# Patient Record
Sex: Female | Born: 1935 | Race: Black or African American | Hispanic: No | Smoking: Never smoker
Health system: Southern US, Community
[De-identification: ages and names within clinical notes are randomized; demographics above are authoritative.]

## PROBLEM LIST (undated history)

## (undated) DIAGNOSIS — I471 Supraventricular tachycardia, unspecified: Secondary | ICD-10-CM

## (undated) DIAGNOSIS — D631 Anemia in chronic kidney disease: Secondary | ICD-10-CM

## (undated) DIAGNOSIS — F32A Depression, unspecified: Secondary | ICD-10-CM

## (undated) DIAGNOSIS — I10 Essential (primary) hypertension: Secondary | ICD-10-CM

## (undated) DIAGNOSIS — F0390 Unspecified dementia without behavioral disturbance: Secondary | ICD-10-CM

## (undated) DIAGNOSIS — D519 Vitamin B12 deficiency anemia, unspecified: Secondary | ICD-10-CM

## (undated) DIAGNOSIS — M009 Pyogenic arthritis, unspecified: Secondary | ICD-10-CM

## (undated) DIAGNOSIS — F329 Major depressive disorder, single episode, unspecified: Secondary | ICD-10-CM

## (undated) DIAGNOSIS — N183 Chronic kidney disease, stage 3 (moderate): Secondary | ICD-10-CM

## (undated) DIAGNOSIS — E119 Type 2 diabetes mellitus without complications: Secondary | ICD-10-CM

## (undated) DIAGNOSIS — E785 Hyperlipidemia, unspecified: Secondary | ICD-10-CM

## (undated) DIAGNOSIS — K922 Gastrointestinal hemorrhage, unspecified: Secondary | ICD-10-CM

## (undated) DIAGNOSIS — N189 Chronic kidney disease, unspecified: Secondary | ICD-10-CM

## (undated) HISTORY — DX: Gastrointestinal hemorrhage, unspecified: K92.2

## (undated) HISTORY — DX: Hyperlipidemia, unspecified: E78.5

## (undated) HISTORY — DX: Chronic kidney disease, stage 3 (moderate): N18.3

## (undated) HISTORY — DX: Supraventricular tachycardia: I47.1

## (undated) HISTORY — DX: Anemia in chronic kidney disease: D63.1

## (undated) HISTORY — DX: Supraventricular tachycardia, unspecified: I47.10

## (undated) HISTORY — DX: Essential (primary) hypertension: I10

## (undated) HISTORY — DX: Type 2 diabetes mellitus without complications: E11.9

## (undated) HISTORY — DX: Unspecified dementia, unspecified severity, without behavioral disturbance, psychotic disturbance, mood disturbance, and anxiety: F03.90

## (undated) HISTORY — DX: Pyogenic arthritis, unspecified: M00.9

## (undated) HISTORY — PX: KNEE SURGERY: SHX244

## (undated) HISTORY — DX: Chronic kidney disease, unspecified: N18.9

## (undated) HISTORY — DX: Vitamin B12 deficiency anemia, unspecified: D51.9

---

## 2001-04-06 ENCOUNTER — Encounter: Payer: Self-pay | Admitting: Internal Medicine

## 2001-04-06 ENCOUNTER — Ambulatory Visit (HOSPITAL_COMMUNITY): Admission: RE | Admit: 2001-04-06 | Discharge: 2001-04-06 | Payer: Self-pay | Admitting: Internal Medicine

## 2002-04-19 ENCOUNTER — Encounter: Payer: Self-pay | Admitting: Internal Medicine

## 2002-04-19 ENCOUNTER — Ambulatory Visit (HOSPITAL_COMMUNITY): Admission: RE | Admit: 2002-04-19 | Discharge: 2002-04-19 | Payer: Self-pay | Admitting: Internal Medicine

## 2003-04-28 ENCOUNTER — Ambulatory Visit (HOSPITAL_COMMUNITY): Admission: RE | Admit: 2003-04-28 | Discharge: 2003-04-28 | Payer: Self-pay | Admitting: Internal Medicine

## 2003-04-28 ENCOUNTER — Encounter: Payer: Self-pay | Admitting: Internal Medicine

## 2004-04-30 ENCOUNTER — Ambulatory Visit (HOSPITAL_COMMUNITY): Admission: RE | Admit: 2004-04-30 | Discharge: 2004-04-30 | Payer: Self-pay | Admitting: Internal Medicine

## 2005-05-03 ENCOUNTER — Ambulatory Visit (HOSPITAL_COMMUNITY): Admission: RE | Admit: 2005-05-03 | Discharge: 2005-05-03 | Payer: Self-pay | Admitting: Internal Medicine

## 2006-05-12 ENCOUNTER — Ambulatory Visit (HOSPITAL_COMMUNITY): Admission: RE | Admit: 2006-05-12 | Discharge: 2006-05-12 | Payer: Self-pay | Admitting: Internal Medicine

## 2006-06-04 ENCOUNTER — Ambulatory Visit (HOSPITAL_COMMUNITY): Admission: RE | Admit: 2006-06-04 | Discharge: 2006-06-04 | Payer: Self-pay | Admitting: Internal Medicine

## 2006-07-28 ENCOUNTER — Ambulatory Visit (HOSPITAL_COMMUNITY): Admission: RE | Admit: 2006-07-28 | Discharge: 2006-07-28 | Payer: Self-pay | Admitting: Internal Medicine

## 2006-08-04 ENCOUNTER — Ambulatory Visit: Payer: Self-pay | Admitting: Orthopedic Surgery

## 2006-08-18 ENCOUNTER — Ambulatory Visit: Payer: Self-pay | Admitting: Orthopedic Surgery

## 2006-11-03 ENCOUNTER — Emergency Department (HOSPITAL_COMMUNITY): Admission: EM | Admit: 2006-11-03 | Discharge: 2006-11-04 | Payer: Self-pay | Admitting: Emergency Medicine

## 2007-06-22 ENCOUNTER — Ambulatory Visit (HOSPITAL_COMMUNITY): Admission: RE | Admit: 2007-06-22 | Discharge: 2007-06-22 | Payer: Self-pay | Admitting: Internal Medicine

## 2008-06-23 ENCOUNTER — Ambulatory Visit (HOSPITAL_COMMUNITY): Admission: RE | Admit: 2008-06-23 | Discharge: 2008-06-23 | Payer: Self-pay | Admitting: Internal Medicine

## 2009-06-26 ENCOUNTER — Ambulatory Visit (HOSPITAL_COMMUNITY): Admission: RE | Admit: 2009-06-26 | Discharge: 2009-06-26 | Payer: Self-pay | Admitting: Internal Medicine

## 2010-06-28 ENCOUNTER — Ambulatory Visit (HOSPITAL_COMMUNITY): Admission: RE | Admit: 2010-06-28 | Discharge: 2010-06-28 | Payer: Self-pay | Admitting: Internal Medicine

## 2011-06-10 ENCOUNTER — Other Ambulatory Visit (HOSPITAL_COMMUNITY): Payer: Self-pay | Admitting: Internal Medicine

## 2011-06-10 DIAGNOSIS — Z139 Encounter for screening, unspecified: Secondary | ICD-10-CM

## 2011-07-02 ENCOUNTER — Ambulatory Visit (HOSPITAL_COMMUNITY)
Admission: RE | Admit: 2011-07-02 | Discharge: 2011-07-02 | Disposition: A | Discharge status: Home / Self Care | Payer: Medicare Other | Source: Ambulatory Visit | Attending: Internal Medicine | Admitting: Internal Medicine

## 2011-07-02 ENCOUNTER — Ambulatory Visit (HOSPITAL_COMMUNITY): Payer: Medicare Other

## 2011-07-02 DIAGNOSIS — Z1231 Encounter for screening mammogram for malignant neoplasm of breast: Secondary | ICD-10-CM | POA: Insufficient documentation

## 2011-07-02 DIAGNOSIS — Z139 Encounter for screening, unspecified: Secondary | ICD-10-CM

## 2011-10-31 ENCOUNTER — Emergency Department (HOSPITAL_COMMUNITY)
Admission: EM | Admit: 2011-10-31 | Discharge: 2011-10-31 | Disposition: A | Discharge status: Home / Self Care | Payer: Medicare Other | Attending: Emergency Medicine | Admitting: Emergency Medicine

## 2011-10-31 ENCOUNTER — Encounter (HOSPITAL_COMMUNITY): Payer: Self-pay

## 2011-10-31 DIAGNOSIS — W19XXXA Unspecified fall, initial encounter: Secondary | ICD-10-CM

## 2011-10-31 DIAGNOSIS — W010XXA Fall on same level from slipping, tripping and stumbling without subsequent striking against object, initial encounter: Secondary | ICD-10-CM | POA: Insufficient documentation

## 2011-10-31 DIAGNOSIS — S40029A Contusion of unspecified upper arm, initial encounter: Secondary | ICD-10-CM | POA: Insufficient documentation

## 2011-10-31 NOTE — ED Notes (Signed)
Pt reports falling today at the store.  Pt denies hitting her head.  Pt reports catching herself with her hands.  Pt reports pain to her rt arm and rt leg.

## 2011-10-31 NOTE — ED Notes (Signed)
Pt states she tripped over a box

## 2011-10-31 NOTE — ED Provider Notes (Signed)
History   This chart was scribed for Laura Diego, MD scribed by Mitchell Heir. The patient was seen in room [facility]18/[facility]18 seen at 14:00.    CSN: [id number]  Arrival date & time 10/31/11  1206   First MD Initiated Contact with Patient 10/31/11 1358      Chief Complaint  Patient presents with  . Fall    (Consider location/radiation/quality/duration/timing/severity/associated sxs/prior Treatment) Laura Davenport is a 78 y.o. female  Patient is a 78 y.o. female presenting with fall. The history is provided by the patient. No language interpreter was used.  Fall The accident occurred 6 to 12 hours ago. The fall occurred while walking. She fell from an unknown height. She landed on a hard floor. There was no blood loss. She was ambulatory at the scene. There was no entrapment after the fall. There was no drug use involved in the accident. There was no alcohol use involved in the accident. She has tried nothing for the symptoms.    Past Medical History  Diagnosis Date  . Diabetes mellitus   . Hypertension     History reviewed. No pertinent past surgical history.  History reviewed. No pertinent family history.  History  Substance Use Topics  . Smoking status: Not on file  . Smokeless tobacco: Not on file  . Alcohol Use: No    Review of Systems  All other systems reviewed and are negative.    Allergies  Review of patient's allergies indicates no known allergies.  Home Medications   Current Outpatient Rx  Name Route Sig Dispense Refill  . ASPIRIN EC 81 MG PO TBEC Oral Take 81 mg by mouth daily.      BP 166/107  Pulse 98  Temp 98.1 F (36.7 C)  Resp 20  Ht 5\' 7"  (1.702 m)  Wt 189 lb (85.73 kg)  BMI 29.60 kg/m2  SpO2 99%  Physical Exam  Nursing note and vitals reviewed. Constitutional: She is oriented to person, place, and time. She appears well-developed and well-nourished. No distress.  HENT:  Head: Normocephalic and atraumatic.  Eyes: Conjunctivae and EOM  are normal. No scleral icterus.  Neck: Neck supple. No thyromegaly present.  Cardiovascular: Normal rate and regular rhythm.  Exam reveals no gallop and no friction rub.   No murmur heard. Pulmonary/Chest: No stridor. She has no wheezes. She has no rales. She exhibits no tenderness.  Abdominal: She exhibits no distension. There is no tenderness. There is no rebound.  Musculoskeletal: Normal range of motion. She exhibits tenderness. She exhibits no edema.       Mild tenderness to right humerus   Lymphadenopathy:    She has no cervical adenopathy.  Neurological: She is oriented to person, place, and time. Coordination normal.  Skin: No rash noted. No erythema.  Psychiatric: She has a normal mood and affect. Her behavior is normal.    ED Course  Procedures (including critical care time) DIAGNOSTIC STUDIES: Oxygen Saturation is 99% on room air, normal by my interpretation.    COORDINATION OF CARE:  Labs Reviewed - No data to display No results found.   No diagnosis found.    MDM   The chart was scribed for me under my direct supervision.  I personally performed the history, physical, and medical decision making and all procedures in the evaluation of this patient.Laura Diego, MD 10/31/11 1435

## 2011-10-31 NOTE — ED Notes (Signed)
Pt left before receiving her d/c papers

## 2011-10-31 NOTE — Discharge Instructions (Signed)
Follow up with your md if any problems °

## 2011-11-14 ENCOUNTER — Telehealth (HOSPITAL_COMMUNITY): Payer: Self-pay | Admitting: Dietician

## 2011-11-14 NOTE — Telephone Encounter (Addendum)
Received referral from Dr. Willey Blade on 11/11/11 for dx: diabetes.

## 2011-11-14 NOTE — Telephone Encounter (Signed)
Appointment scheduled for 11/20/11 @ 8:30 AM.

## 2011-11-20 ENCOUNTER — Encounter (HOSPITAL_COMMUNITY): Payer: Self-pay | Admitting: Dietician

## 2011-11-20 NOTE — Progress Notes (Signed)
Outpatient Initial Nutrition Assessment  Date:11/20/2011   Time: 8:30 AM  Referring Physician: Reason for Visit:   Nutrition Assessment:  Ht: Height: 5\' 5"  (165.1 cm)   Wt:Weight: 189 lb (85.73 kg)   IBW: 125# %IBW: 151% UBW: 188# %UBW: 100% BMI: Body mass index is 31.45 kg/(m^2).  Goal Weight: 170# (10% weight loss) Weight hx: Pt reports her highest was was 260# about 15 years ago, around the time she retired and was diagnosed with diabetes. She is unable to recall her lowest weight, but reports she was "very little".   Estimated nutritional needs: 1558-1699 kcals, 69-86 protein daily, 1.6-1.7 L fluid daily  PMH:  Past Medical History  Diagnosis Date  . Diabetes mellitus   . Hypertension     Medications:  Current Outpatient Rx  Name Route Sig Dispense Refill  . AMLODIPINE-OLMESARTAN 5-40 MG PO TABS Oral Take 1 tablet by mouth daily.    Marland Kitchen AMLODIPINE BESYLATE-VALSARTAN 5-320 MG PO TABS Oral Take 1 tablet by mouth daily.    . ASPIRIN EC 81 MG PO TBEC Oral Take 81 mg by mouth daily.    . ATORVASTATIN CALCIUM 40 MG PO TABS Oral Take 40 mg by mouth daily.    Marland Kitchen HYDROCHLOROTHIAZIDE 25 MG PO TABS Oral Take 25 mg by mouth daily.    . INSULIN GLARGINE 100 UNIT/ML [place] SOLN Subcutaneous Inject 45 Units into the skin at bedtime.      Labs: CMP  No results found for this basename: na, k, cl, co2, glucose, bun, creatinine, calcium, prot, albumin, ast, alt, alkphos, bilitot, gfrnonaa, gfraa    Lipid Panel  No results found for this basename: chol, trig, hdl, cholhdl, vldl, ldlcalc     No results found for this basename: HGBA1C   No results found for this basename: GLUF, MICROALBUR, LDLCALC, CREATININE    Per Dr. Ria Comment records: Hgb A1c: 9.8, Glucose: 281, Total Cholesterol: 198  Lifestyle/ social habits: Laura Davenport lives alone in [place]. She is a widow; her husband passed away 11 years ago. She has 10 children who lives in various parts of the country, but 6 live locally in  [place] and [place]. Her youngest son frequently checks in on her. She retired 17 years ago from [employer]. She reports her stress level at 2 or 3, citing very little stress as her faith is very important to her. She is very active in her church community, spending most afternoons at [employer]. 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   Nutrition hx/habits: Laura Davenport mainly cooks at home. She will eat at her daughter's house on Sunday. She goes to [employer] once every 2-3 months with family. She did not bring her glucometer with her today and cannot remember her home CBGs. She reports her meter has not been working properly, but she is going to take it to Dr. Ria Comment office. She eats 2 meals a day and drinks mostly water. She walks around her house for 20 minutes, 3 times a week. She reports she does not like to go outside due to the weather and because is afraid of the dogs in her neighborhood.  The last times she received diabetes education was 15 years ago, when she went to a class at the health department when she was first diagnosed.   Diet recall: Breakfast (9:30-10 AM): toast with margarine (rotates between white and whole wheat bread), boiled egg, coffee with sugar; Lunch (3:00 PM): pinto beans, orange soda;  HS snack: pack of crackers  Nutrition Diagnosis: Inconsistent carbohydrate intake r/t disordered eating pattern AEB skipping meals, Hgb A1c: 9.8.   Nutrition Intervention: Nutrition rx: 1200 kcal NAS, diabetic diet; 3 meals per day; limit 1 starch per meal; low calorie beverages only; 20-30 minutes physical activity 5 times per week  Education/Counseling Provided: Educated pt on diabetic diet principles. Emphasized plate method, sources of carbohydrates, and importance of eating 3 meals per day. Discussed alternatives to sodas. Discussed importance of Na restriction and discussed ways to season foods without salt. Provided Mrs. Dash and  Splenda samples. Provided plate method handout.   Understanding, Motivation, Ability to Follow Recommendations: Expect fair to good compliance.   Monitoring and Evaluation: Goals: 1) 1-2# weight loss per week; 2) 20-30 minutes physical activity 5 times per week; 3) 3 meals per day; 4) Hgb A1c < 7.0  Recommendations: 1) For weight loss: 1058-1199 kcals daily; 2) 3 meals per day; 3) Use salt-free seasoning for salt; artificial sweetener in place of sugar  F/U: PRN. Provided RD contact information.  Alene Mires, RD  11/20/2011  Time: 8:30 AM

## 2012-03-17 ENCOUNTER — Emergency Department (HOSPITAL_COMMUNITY)
Admission: EM | Admit: 2012-03-17 | Discharge: 2012-03-18 | Disposition: A | Discharge status: Home / Self Care | Payer: Medicare Other | Attending: Emergency Medicine | Admitting: Emergency Medicine

## 2012-03-17 ENCOUNTER — Encounter (HOSPITAL_COMMUNITY): Payer: Self-pay | Admitting: Emergency Medicine

## 2012-03-17 DIAGNOSIS — Z794 Long term (current) use of insulin: Secondary | ICD-10-CM | POA: Insufficient documentation

## 2012-03-17 DIAGNOSIS — E1169 Type 2 diabetes mellitus with other specified complication: Secondary | ICD-10-CM | POA: Insufficient documentation

## 2012-03-17 DIAGNOSIS — E162 Hypoglycemia, unspecified: Secondary | ICD-10-CM

## 2012-03-17 DIAGNOSIS — I10 Essential (primary) hypertension: Secondary | ICD-10-CM | POA: Insufficient documentation

## 2012-03-17 DIAGNOSIS — Z79899 Other long term (current) drug therapy: Secondary | ICD-10-CM | POA: Insufficient documentation

## 2012-03-17 DIAGNOSIS — N390 Urinary tract infection, site not specified: Secondary | ICD-10-CM | POA: Insufficient documentation

## 2012-03-17 LAB — GLUCOSE, CAPILLARY
Glucose-Capillary: 188 mg/dL — ABNORMAL HIGH (ref 70–99)
Glucose-Capillary: 80 mg/dL (ref 70–99)

## 2012-03-17 LAB — BASIC METABOLIC PANEL
BUN: 19 mg/dL (ref 6–23)
CO2: 26 mEq/L (ref 19–32)
Calcium: 10.5 mg/dL (ref 8.4–10.5)
Chloride: 105 mEq/L (ref 96–112)
Creatinine, Ser: 0.86 mg/dL (ref 0.50–1.10)
GFR calc Af Amer: 73 mL/min — ABNORMAL LOW (ref >90)
GFR calc non Af Amer: 63 mL/min — ABNORMAL LOW (ref >90)
Glucose, Bld: 187 mg/dL — ABNORMAL HIGH (ref 70–99)
Potassium: 3.5 mEq/L (ref 3.5–5.1)
Sodium: 142 mEq/L (ref 135–145)

## 2012-03-17 LAB — URINALYSIS, ROUTINE W REFLEX MICROSCOPIC
Bilirubin Urine: NEGATIVE
Glucose, UA: NEGATIVE mg/dL
Ketones, ur: NEGATIVE mg/dL
Nitrite: POSITIVE — AB
Protein, ur: NEGATIVE mg/dL
Specific Gravity, Urine: >1.03 — ABNORMAL HIGH (ref 1.005–1.030)
Urobilinogen, UA: 0.2 mg/dL (ref 0.0–1.0)
pH: 5.5 (ref 5.0–8.0)

## 2012-03-17 LAB — CBC WITH DIFFERENTIAL/PLATELET
Basophils Absolute: 0 10*3/uL (ref 0.0–0.1)
Basophils Relative: 0 % (ref 0–1)
Eosinophils Absolute: 0 10*3/uL (ref 0.0–0.7)
Eosinophils Relative: 0 % (ref 0–5)
HCT: 35.8 % — ABNORMAL LOW (ref 36.0–46.0)
Hemoglobin: 12.4 g/dL (ref 12.0–15.0)
Lymphocytes Relative: 12 % (ref 12–46)
Lymphs Abs: 1.1 10*3/uL (ref 0.7–4.0)
MCH: 31.9 pg (ref 26.0–34.0)
MCHC: 34.6 g/dL (ref 30.0–36.0)
MCV: 92 fL (ref 78.0–100.0)
Monocytes Absolute: 0.4 10*3/uL (ref 0.1–1.0)
Monocytes Relative: 4 % (ref 3–12)
Neutro Abs: 8.2 10*3/uL — ABNORMAL HIGH (ref 1.7–7.7)
Neutrophils Relative %: 84 % — ABNORMAL HIGH (ref 43–77)
Platelets: 218 10*3/uL (ref 150–400)
RBC: 3.89 MIL/uL (ref 3.87–5.11)
RDW: 13.9 % (ref 11.5–15.5)
WBC: 9.7 10*3/uL (ref 4.0–10.5)

## 2012-03-17 LAB — URINE MICROSCOPIC-ADD ON

## 2012-03-17 MED ORDER — CEPHALEXIN 500 MG PO CAPS
500.0000 mg | ORAL_CAPSULE | Freq: Once | ORAL | Status: AC
  Administered 2012-03-18: 500 mg via ORAL
  Filled 2012-03-17: qty 1

## 2012-03-17 MED ORDER — CEPHALEXIN 500 MG PO CAPS: 500.0000 mg | ORAL_CAPSULE | Freq: Three times a day (TID) | ORAL | Status: AC

## 2012-03-17 NOTE — ED Notes (Signed)
EMS states she was found unresponsive by family and CBL was 90, administered glucagon and repeat CBL was 81.  EMS states pt was somewhat combative on scene and was unable to establish IV access.  On arrival here, pt is alert and pleasant, family at bedside.

## 2012-03-17 NOTE — ED Notes (Signed)
Pt ambulatory to restroom, steady gait, stand by assist.

## 2012-03-17 NOTE — ED Notes (Signed)
Pt states she is feeling well, ready to go home.

## 2012-03-17 NOTE — ED Provider Notes (Signed)
History  This chart was scribed for Janice Norrie, MD by Jenne Campus. This patient was seen in room [facility]15/[facility]15 and the patient's care was started at 10:28PM.  CSN: [id number]  Arrival date & time 03/17/12  2203   First MD Initiated Contact with Patient 03/17/12 2228      Chief Complaint  Patient presents with  . Hypoglycemia     The history is provided by the patient. No language interpreter was used.    Laura Davenport is a 78 y.o. female brought in by ambulance, who presents to the Emergency Department for hypoglycemia. EMS reports that pt was found unresponsive by family and CBL was 5.  Upon administration of glucagon, CBL rose to 81.  EMS reports pt was somewhat combative on scene and they were unable to establish an IV.  Upon arrival to ED, pt is alert and pleasant.  Pt reports eating breakfast (eggs and toast) 13 hours PTA, lunch ([place] and broccoli) 8 hours PTA, but no food intake since.  Pt reports that this is a normal daily food intake and schedule. She usually eats later in the evening, but sometimes eats a snack before her evening meal.  Pt denies having any symptoms currently including diarrhea, nausea, difficulty urinating, fever, lethargy and coughing.  Pt has a h/o HTN and DM and takes metformin and insulin.  Pt denies tobacco and alcohol use.    PCP Dr Willey Blade  Past Medical History  Diagnosis Date  . Diabetes mellitus   . Hypertension     History reviewed. No pertinent past surgical history.  No family history on file.  History  Substance Use Topics  . Smoking status: Not on file  . Smokeless tobacco: Not on file  . Alcohol Use: No  lives alone  No Ob history provided.  Review of Systems  Constitutional: Negative for fever and chills.  Respiratory: Negative for cough and shortness of breath.   Cardiovascular: Negative for chest pain.  Gastrointestinal: Negative for nausea, vomiting and abdominal pain.  Genitourinary: Negative for difficulty urinating.   All other systems reviewed and are negative.    Allergies  Review of patient's allergies indicates no known allergies.  Home Medications   Current Outpatient Rx  Name Route Sig Dispense Refill  . AMLODIPINE-OLMESARTAN 5-40 MG PO TABS Oral Take 1 tablet by mouth daily.    Marland Kitchen AMLODIPINE BESYLATE-VALSARTAN 5-320 MG PO TABS Oral Take 1 tablet by mouth daily.    . ASPIRIN EC 81 MG PO TBEC Oral Take 81 mg by mouth daily.    . ATORVASTATIN CALCIUM 40 MG PO TABS Oral Take 40 mg by mouth daily.    Marland Kitchen HYDROCHLOROTHIAZIDE 25 MG PO TABS Oral Take 25 mg by mouth daily.    . INSULIN GLARGINE 100 UNIT/ML [place] SOLN Subcutaneous Inject 45 Units into the skin at bedtime.    metformin    BP 127/73  Pulse 97  Temp 94.6 F (34.8 C) (Oral)  Resp 18  Ht 5\' 7"  (1.702 m)  Wt 190 lb (86.183 kg)  BMI 29.76 kg/m2  SpO2 97%  Vital signs normal    Physical Exam  Nursing note and vitals reviewed. Constitutional: She is oriented to person, place, and time. She appears well-developed and well-nourished.       She is currently eating  HENT:  Head: Normocephalic and atraumatic.  Mouth/Throat: Oropharynx is clear and moist.  Eyes: Conjunctivae and EOM are normal. Pupils are equal, round, and reactive to light.  Neck:  Normal range of motion. Neck supple. No tracheal deviation present.  Cardiovascular: Normal rate, regular rhythm and normal heart sounds.   Pulmonary/Chest: Effort normal and breath sounds normal. No respiratory distress. She has no wheezes. She has no rales.  Abdominal: Soft. There is no tenderness.  Musculoskeletal: Normal range of motion. She exhibits no edema.  Neurological: She is alert and oriented to person, place, and time.  Skin: Skin is warm and dry.  Psychiatric: She has a normal mood and affect. Her behavior is normal.       Pt is pleasant and cooperative    ED Course  Procedures (including critical care time)   Medications  cephALEXin (KEFLEX) capsule 500 mg (not  administered)      DIAGNOSTIC STUDIES: Oxygen Saturation is 97% on room air, adequate by my interpretation.    COORDINATION OF CARE: 10:38PM-Discussed treatment plan which includes urinalysis with pt at bedside and pt agreed to plan.   Results for orders placed during the hospital encounter of 03/17/12  GLUCOSE, CAPILLARY      Component Value Range   Glucose-Capillary 80  70 - 99 mg/dL  URINALYSIS, ROUTINE W REFLEX MICROSCOPIC      Component Value Range   Color, Urine AMBER (*) YELLOW   APPearance HAZY (*) CLEAR   Specific Gravity, Urine >1.030 (*) 1.005 - 1.030   pH 5.5  5.0 - 8.0   Glucose, UA NEGATIVE  NEGATIVE mg/dL   Hgb urine dipstick TRACE (*) NEGATIVE   Bilirubin Urine NEGATIVE  NEGATIVE   Ketones, ur NEGATIVE  NEGATIVE mg/dL   Protein, ur NEGATIVE  NEGATIVE mg/dL   Urobilinogen, UA 0.2  0.0 - 1.0 mg/dL   Nitrite POSITIVE (*) NEGATIVE   Leukocytes, UA MODERATE (*) NEGATIVE  BASIC METABOLIC PANEL      Component Value Range   Sodium 142  135 - 145 mEq/L   Potassium 3.5  3.5 - 5.1 mEq/L   Chloride 105  96 - 112 mEq/L   CO2 26  19 - 32 mEq/L   Glucose, Bld 187 (*) 70 - 99 mg/dL   BUN 19  6 - 23 mg/dL   Creatinine, Ser 0.86  0.50 - 1.10 mg/dL   Calcium 10.5  8.4 - 10.5 mg/dL   GFR calc non Af Amer 63 (*) >90 mL/min   GFR calc Af Amer 73 (*) >90 mL/min  CBC WITH DIFFERENTIAL      Component Value Range   WBC 9.7  4.0 - 10.5 K/uL   RBC 3.89  3.87 - 5.11 MIL/uL   Hemoglobin 12.4  12.0 - 15.0 g/dL   HCT 35.8 (*) 36.0 - 46.0 %   MCV 92.0  78.0 - 100.0 fL   MCH 31.9  26.0 - 34.0 pg   MCHC 34.6  30.0 - 36.0 g/dL   RDW 13.9  11.5 - 15.5 %   Platelets 218  150 - 400 K/uL   Neutrophils Relative 84 (*) 43 - 77 %   Neutro Abs 8.2 (*) 1.7 - 7.7 K/uL   Lymphocytes Relative 12  12 - 46 %   Lymphs Abs 1.1  0.7 - 4.0 K/uL   Monocytes Relative 4  3 - 12 %   Monocytes Absolute 0.4  0.1 - 1.0 K/uL   Eosinophils Relative 0  0 - 5 %   Eosinophils Absolute 0.0  0.0 - 0.7 K/uL     Basophils Relative 0  0 - 1 %   Basophils Absolute 0.0  0.0 -  0.1 K/uL  GLUCOSE, CAPILLARY      Component Value Range   Glucose-Capillary 188 (*) 70 - 99 mg/dL  URINE MICROSCOPIC-ADD ON      Component Value Range   Squamous Epithelial / LPF FEW (*) RARE   WBC, UA 11-20  <3 WBC/hpf   RBC / HPF 0-2  <3 RBC/hpf   Bacteria, UA MANY (*) RARE   Laboratory interpretation all normal except UTI, hyperglycemia   1. Hypoglycemia   2. Urinary tract infection      New Prescriptions   CEPHALEXIN (KEFLEX) 500 MG CAPSULE    Take 1 capsule (500 mg total) by mouth 3 (three) times daily.    Plan discharge  Rolland Porter, MD, FACEP    MDM   I personally performed the services described in this documentation, which was scribed in my presence. The recorded information has been reviewed and considered.  Rolland Porter, MD, Abram Sander        Janice Norrie, MD 03/17/12 [phone]

## 2012-03-17 NOTE — ED Notes (Signed)
Gave patient warm blankets, no other needs voiced at this time.

## 2012-03-17 NOTE — ED Notes (Signed)
Patient feels okay now, states she does not remember what happened to her.  Family states she didn't eat dinner this evening.

## 2012-03-17 NOTE — ED Notes (Signed)
Glucagon IM by EMS

## 2012-03-18 LAB — URINE CULTURE: Colony Count: >=100000

## 2012-06-02 ENCOUNTER — Other Ambulatory Visit (HOSPITAL_COMMUNITY): Payer: Self-pay | Admitting: Internal Medicine

## 2012-06-02 DIAGNOSIS — Z139 Encounter for screening, unspecified: Secondary | ICD-10-CM

## 2012-07-02 ENCOUNTER — Ambulatory Visit (HOSPITAL_COMMUNITY): Payer: Medicare Other

## 2012-07-06 ENCOUNTER — Ambulatory Visit (HOSPITAL_COMMUNITY)
Admission: RE | Admit: 2012-07-06 | Discharge: 2012-07-06 | Disposition: A | Discharge status: Home / Self Care | Payer: Medicare Other | Source: Ambulatory Visit | Attending: Internal Medicine | Admitting: Internal Medicine

## 2012-07-06 DIAGNOSIS — Z1231 Encounter for screening mammogram for malignant neoplasm of breast: Secondary | ICD-10-CM | POA: Insufficient documentation

## 2012-07-06 DIAGNOSIS — Z139 Encounter for screening, unspecified: Secondary | ICD-10-CM

## 2012-11-01 ENCOUNTER — Inpatient Hospital Stay (HOSPITAL_COMMUNITY)
Admission: EM | Admit: 2012-11-01 | Discharge: 2012-11-05 | DRG: 419 | Disposition: A | Discharge status: Home / Self Care | Payer: Medicare Other | Attending: Family Medicine | Admitting: Family Medicine

## 2012-11-01 ENCOUNTER — Encounter (HOSPITAL_COMMUNITY): Payer: Self-pay

## 2012-11-01 ENCOUNTER — Emergency Department (HOSPITAL_COMMUNITY): Payer: Medicare Other

## 2012-11-01 DIAGNOSIS — E785 Hyperlipidemia, unspecified: Secondary | ICD-10-CM | POA: Diagnosis present

## 2012-11-01 DIAGNOSIS — E119 Type 2 diabetes mellitus without complications: Secondary | ICD-10-CM | POA: Diagnosis present

## 2012-11-01 DIAGNOSIS — R1011 Right upper quadrant pain: Secondary | ICD-10-CM | POA: Diagnosis present

## 2012-11-01 DIAGNOSIS — K8010 Calculus of gallbladder with chronic cholecystitis without obstruction: Principal | ICD-10-CM | POA: Diagnosis present

## 2012-11-01 DIAGNOSIS — I10 Essential (primary) hypertension: Secondary | ICD-10-CM | POA: Diagnosis present

## 2012-11-01 DIAGNOSIS — R109 Unspecified abdominal pain: Secondary | ICD-10-CM

## 2012-11-01 LAB — COMPREHENSIVE METABOLIC PANEL
ALT: 511 U/L — ABNORMAL HIGH (ref 0–35)
AST: 1246 U/L — ABNORMAL HIGH (ref 0–37)
Albumin: 3.8 g/dL (ref 3.5–5.2)
Alkaline Phosphatase: 138 U/L — ABNORMAL HIGH (ref 39–117)
BUN: 23 mg/dL (ref 6–23)
CO2: 22 mEq/L (ref 19–32)
Calcium: 9.6 mg/dL (ref 8.4–10.5)
Chloride: 98 mEq/L (ref 96–112)
Creatinine, Ser: 1.02 mg/dL (ref 0.50–1.10)
GFR calc Af Amer: 59 mL/min — ABNORMAL LOW (ref >90)
GFR calc non Af Amer: 51 mL/min — ABNORMAL LOW (ref >90)
Glucose, Bld: 270 mg/dL — ABNORMAL HIGH (ref 70–99)
Potassium: 3.9 mEq/L (ref 3.5–5.1)
Sodium: 134 mEq/L — ABNORMAL LOW (ref 135–145)
Total Bilirubin: 1 mg/dL (ref 0.3–1.2)
Total Protein: 7.8 g/dL (ref 6.0–8.3)

## 2012-11-01 LAB — CBC WITH DIFFERENTIAL/PLATELET
Basophils Absolute: 0 10*3/uL (ref 0.0–0.1)
Basophils Relative: 0 % (ref 0–1)
Eosinophils Absolute: 0 10*3/uL (ref 0.0–0.7)
Eosinophils Relative: 0 % (ref 0–5)
HCT: 35.5 % — ABNORMAL LOW (ref 36.0–46.0)
Hemoglobin: 12.1 g/dL (ref 12.0–15.0)
Lymphocytes Relative: 18 % (ref 12–46)
Lymphs Abs: 1.2 10*3/uL (ref 0.7–4.0)
MCH: 30.4 pg (ref 26.0–34.0)
MCHC: 34.1 g/dL (ref 30.0–36.0)
MCV: 89.2 fL (ref 78.0–100.0)
Monocytes Absolute: 0.1 10*3/uL (ref 0.1–1.0)
Monocytes Relative: 1 % — ABNORMAL LOW (ref 3–12)
Neutro Abs: 5.3 10*3/uL (ref 1.7–7.7)
Neutrophils Relative %: 81 % — ABNORMAL HIGH (ref 43–77)
Platelets: 207 10*3/uL (ref 150–400)
RBC: 3.98 MIL/uL (ref 3.87–5.11)
RDW: 13 % (ref 11.5–15.5)
WBC: 6.6 10*3/uL (ref 4.0–10.5)

## 2012-11-01 LAB — GLUCOSE, CAPILLARY
Glucose-Capillary: 208 mg/dL — ABNORMAL HIGH (ref 70–99)
Glucose-Capillary: 106 mg/dL — ABNORMAL HIGH (ref 70–99)

## 2012-11-01 LAB — LIPASE, BLOOD: Lipase: 26 U/L (ref 11–59)

## 2012-11-01 LAB — TROPONIN I: Troponin I: <0.3 ng/mL (ref <0.30)

## 2012-11-01 MED ORDER — HYDROMORPHONE HCL PF 1 MG/ML IJ SOLN
1.0000 mg | Freq: Once | INTRAMUSCULAR | Status: AC
  Administered 2012-11-01: 1 mg via INTRAVENOUS
  Filled 2012-11-01: qty 1

## 2012-11-01 MED ORDER — HYDROMORPHONE HCL PF 1 MG/ML IJ SOLN: 1.0000 mg | INTRAMUSCULAR | Status: AC | PRN

## 2012-11-01 MED ORDER — SODIUM CHLORIDE 0.45 % IV SOLN
INTRAVENOUS | Status: DC
  Administered 2012-11-01 – 2012-11-02 (×3): via INTRAVENOUS

## 2012-11-01 MED ORDER — ACETAMINOPHEN 325 MG PO TABS
650.0000 mg | ORAL_TABLET | Freq: Four times a day (QID) | ORAL | Status: DC | PRN
  Administered 2012-11-01: 650 mg via ORAL
  Filled 2012-11-01: qty 2

## 2012-11-01 MED ORDER — ATORVASTATIN CALCIUM 40 MG PO TABS
40.0000 mg | ORAL_TABLET | Freq: Every day | ORAL | Status: DC
  Administered 2012-11-01 – 2012-11-05 (×4): 40 mg via ORAL
  Filled 2012-11-01 (×4): qty 1

## 2012-11-01 MED ORDER — INSULIN ASPART 100 UNIT/ML ~~LOC~~ SOLN
0.0000 [IU] | Freq: Three times a day (TID) | SUBCUTANEOUS | Status: DC
  Administered 2012-11-01: 5 [IU] via SUBCUTANEOUS
  Administered 2012-11-02: 2 [IU] via SUBCUTANEOUS
  Administered 2012-11-03: 3 [IU] via SUBCUTANEOUS
  Administered 2012-11-03 (×2): 2 [IU] via SUBCUTANEOUS
  Administered 2012-11-04: 3 [IU] via SUBCUTANEOUS
  Administered 2012-11-04 – 2012-11-05 (×2): 2 [IU] via SUBCUTANEOUS

## 2012-11-01 MED ORDER — ONDANSETRON HCL 4 MG/2ML IJ SOLN
4.0000 mg | Freq: Once | INTRAMUSCULAR | Status: AC
  Administered 2012-11-01: 4 mg via INTRAVENOUS
  Filled 2012-11-01: qty 2

## 2012-11-01 MED ORDER — IOHEXOL 300 MG/ML  SOLN
100.0000 mL | Freq: Once | INTRAMUSCULAR | Status: AC | PRN
  Administered 2012-11-01: 100 mL via INTRAVENOUS

## 2012-11-01 MED ORDER — ONDANSETRON HCL 4 MG/2ML IJ SOLN: 4.0000 mg | Freq: Three times a day (TID) | INTRAMUSCULAR | Status: AC | PRN

## 2012-11-01 MED ORDER — LORAZEPAM 2 MG/ML IJ SOLN
0.5000 mg | Freq: Once | INTRAMUSCULAR | Status: AC
  Administered 2012-11-01: 0.5 mg via INTRAVENOUS
  Filled 2012-11-01: qty 1

## 2012-11-01 MED ORDER — ENOXAPARIN SODIUM 40 MG/0.4ML ~~LOC~~ SOLN
40.0000 mg | SUBCUTANEOUS | Status: DC
  Administered 2012-11-01 – 2012-11-04 (×4): 40 mg via SUBCUTANEOUS
  Filled 2012-11-01: qty 0.3
  Filled 2012-11-01: qty 0.4
  Filled 2012-11-01: qty 0.3
  Filled 2012-11-01: qty 0.4

## 2012-11-01 MED ORDER — HYDROCHLOROTHIAZIDE 25 MG PO TABS
25.0000 mg | ORAL_TABLET | Freq: Every day | ORAL | Status: DC
  Administered 2012-11-01 – 2012-11-02 (×2): 25 mg via ORAL
  Filled 2012-11-01 (×2): qty 1

## 2012-11-01 MED ORDER — SODIUM CHLORIDE 0.9 % IV BOLUS (SEPSIS)
500.0000 mL | Freq: Once | INTRAVENOUS | Status: AC
  Administered 2012-11-01: 500 mL via INTRAVENOUS

## 2012-11-01 MED ORDER — ASPIRIN EC 81 MG PO TBEC
81.0000 mg | DELAYED_RELEASE_TABLET | Freq: Every day | ORAL | Status: DC
  Administered 2012-11-02 – 2012-11-03 (×2): 81 mg via ORAL
  Filled 2012-11-01 (×3): qty 1

## 2012-11-01 MED ORDER — IOHEXOL 300 MG/ML  SOLN
50.0000 mL | Freq: Once | INTRAMUSCULAR | Status: AC | PRN
  Administered 2012-11-01: 50 mL via ORAL

## 2012-11-01 MED ORDER — INSULIN ASPART 100 UNIT/ML ~~LOC~~ SOLN
0.0000 [IU] | Freq: Every day | SUBCUTANEOUS | Status: DC
  Administered 2012-11-04: 2 [IU] via SUBCUTANEOUS

## 2012-11-01 NOTE — ED Notes (Signed)
Pt is currently in CT.

## 2012-11-01 NOTE — ED Notes (Signed)
Report attempted.  Nurse to call back.

## 2012-11-01 NOTE — ED Notes (Signed)
Family states they do not need anything at this time.

## 2012-11-01 NOTE — ED Notes (Signed)
Pt c/o vomiting x one time on the way to ER. Pt states she has been shaking since about 7:30 this morning. Patient c/o of right sided pain that woke her from sleep.

## 2012-11-01 NOTE — H&P (Signed)
NAMEJUDETH, PRISBREY                ACCOUNT NO.:  [id number]  MEDICAL RECORD NO.:  [id number]  LOCATION:  [facility]                         FACILITY:  [facility]  PHYSICIAN:  Angus G. Everette Rank, MD   DATE OF BIRTH:  1931-07-04  DATE OF ADMISSION:  11/01/2012 DATE OF DISCHARGE:  LH                             HISTORY & PHYSICAL   This 79 year old patient was admitted through the emergency room with a chief complaint being vomiting and abdominal pain.  This patient apparently developed upper abdominal pain in early morning hours.  It persisted intermittently throughout the day.  She had 1 episode of vomiting and was anorexic at a low-grade fever.  She was seen and evaluated by ED physician.  CT of abdomen was ordered and showed evidence of cholelithiasis, gallbladder distention.  Mild intrahepatic and extrahepatic ductal dilatation with common bile duct measuring 9 mm. She also had abnormal liver function tests.  A chest x-ray showed mild cardiomegaly, no acute findings.  The patient had a white count of 6600 with hemoglobin of 12.1, hematocrit 35.5.  The patient was given Dilaudid 1 mg and started on intravenous fluids and admitted.  ED doctor did discuss the patient's problem with surgery.  SOCIAL HISTORY:  The patient does not smoke or drink alcohol.  FAMILY HISTORY:  No family history available.  The patient has no previous surgery.  PAST MEDICAL HISTORY:  Diabetes type 2 and hypertension.  ALLERGIES:  The patient has known allergies.  REVIEW OF SYSTEMS:  HEENT:  Negative.  Cardiopulmonary:  No cough, hemoptysis, or dyspnea.  GI:  Episodes of nausea and vomiting, and epigastric and right upper quadrant pain.  No urinary symptoms.  MEDICATION LIST:  Tylenol 500 mg as needed for headache, Azor 1 daily, aspirin 81 mg daily, Lipitor 40 mg daily, HydroDIURIL 25 mg daily, Lantus insulin 60 units at bedtime.  PHYSICAL EXAMINATION:  GENERAL:  Alert female. VITAL SIGNS:  Blood pressure 111/55,  respirations 18, pulse 118, temp 97.7. HEENT:  Eyes, PERRLA.  TMs negative.  Oropharynx benign. Neck:  Supple.  No JVD or thyroid abnormalities. HEART:  Regular rhythm.  No murmurs. LUNGS:  Clear to P and A. ABDOMEN:  Tenderness in epigastrium and right upper quadrant.  No organomegaly, noticed abdominal distention. EXTREMITIES:  Free of edema. NEUROLOGICAL:  Cranial nerves intact.  No motor or sensory abnormalities.  ASSESSMENT:  The patient does have diabetes.  She was admitted with abdominal pain, cholelithiasis, and abnormal liver function studies.  PLAN:  To continue IV fluids.  Continue to monitor blood sugars. Continue IV pain medications.  Will contact Surgical Service to see her in consultation.     Angus G. Everette Rank, MD     AGM/MEDQ  D:  11/01/2012  T:  11/01/2012  Job:  [id number]

## 2012-11-01 NOTE — Progress Notes (Signed)
11/01/12 1638 Reviewed fall prevention/safety plan with patient on admission, daughter at bedside. Instructed patient to call for assist and not attempt getting up on her own for safety. Stated understood, demonstrates correct use of call light. Patient safety video viewed. Yellow armband and red socks applied, call light within reach. bedalarm for safety. Donavan Foil, RN

## 2012-11-01 NOTE — Progress Notes (Signed)
11/01/12 1650 Initial skin assessment verified by two RNs - A. Stann Mainland, RN and Jerrye Noble, RN. Donavan Foil, RN

## 2012-11-01 NOTE — ED Provider Notes (Signed)
History    This chart was scribed for Laura Diego, MD by Marin Comment, ED Scribe. The patient was seen in room APA14/APA14. Patient's care was started at 0905.   CSN: [id number]  Arrival date & time 11/01/12  [id number]   First MD Initiated Contact with Patient 11/01/12 [phone]      Chief Complaint  Patient presents with  . Emesis   Laura Davenport is a 79 y.o. female who presents to the Emergency Department complaining of RUQ abdominal pain from her epigastric that radiates around to her right side that woke her up around 3 am. She states that she has had one episode of emesis that occurred on her way to the ED. She denies any fevers, chills, diarrhea. Temperature here in ED is 100.0. She denies any past abdominal surgical hx.   Patient is a 79 y.o. female presenting with abdominal pain. The history is provided by the patient. No language interpreter was used.  Abdominal Pain Pain location:  RUQ Onset quality:  Sudden Duration:  6 hours Timing:  Constant Chronicity:  New Associated symptoms: nausea and vomiting   Associated symptoms: no chest pain, no chills, no cough, no diarrhea, no fatigue, no fever and no hematuria     Past Medical History  Diagnosis Date  . Diabetes mellitus   . Hypertension     No past surgical history on file.  No family history on file.  History  Substance Use Topics  . Smoking status: Not on file  . Smokeless tobacco: Not on file  . Alcohol Use: No    OB History   Grav Para Term Preterm Abortions TAB SAB Ect Mult Living                  Review of Systems  Constitutional: Negative for fever, chills and fatigue.  HENT: Negative for congestion, sinus pressure and ear discharge.   Eyes: Negative for discharge.  Respiratory: Negative for cough.   Cardiovascular: Negative for chest pain.  Gastrointestinal: Positive for nausea, vomiting and abdominal pain. Negative for diarrhea.  Genitourinary: Negative for frequency and hematuria.   Musculoskeletal: Negative for back pain.  Skin: Negative for rash.  Neurological: Negative for seizures and headaches.  Psychiatric/Behavioral: Negative for hallucinations.  All other systems reviewed and are negative.    Allergies  Review of patient's allergies indicates no known allergies.  Home Medications   Current Outpatient Rx  Name  Route  Sig  Dispense  Refill  . acetaminophen (TYLENOL) 500 MG tablet   Oral   Take 500 mg by mouth as needed. For headache pain         . amLODipine-olmesartan (AZOR) 5-40 MG per tablet   Oral   Take 1 tablet by mouth daily.         Marland Kitchen aspirin EC 81 MG tablet   Oral   Take 81 mg by mouth every morning.          Marland Kitchen atorvastatin (LIPITOR) 40 MG tablet   Oral   Take 40 mg by mouth daily.         . hydrochlorothiazide (HYDRODIURIL) 25 MG tablet   Oral   Take 25 mg by mouth daily.         . insulin glargine (LANTUS) 100 UNIT/ML injection   Subcutaneous   Inject 60 Units into the skin at bedtime.            Temp(Src) 100 F (37.8 C) (Oral)  Resp 24  Ht 5\' 7"  (1.702 m)  Wt 200 lb (90.719 kg)  BMI 31.32 kg/m2  Physical Exam  Nursing note and vitals reviewed. Constitutional: She is oriented to person, place, and time. She appears well-developed and well-nourished.  HENT:  Head: Normocephalic and atraumatic.  Eyes: Conjunctivae and EOM are normal. No scleral icterus.  Neck: Neck supple. No thyromegaly present.  Cardiovascular: Regular rhythm and normal heart sounds.  Tachycardia present.  Exam reveals no gallop and no friction rub.   No murmur heard. Pulmonary/Chest: Effort normal and breath sounds normal. No stridor. No respiratory distress. She has no wheezes. She has no rales. She exhibits no tenderness.  Abdominal: Soft. Bowel sounds are normal. She exhibits no distension. There is tenderness. There is no rebound.  Moderate RUQ abdominal tenderness.   Musculoskeletal: Normal range of motion. She exhibits no edema.   Lymphadenopathy:    She has no cervical adenopathy.  Neurological: She is alert and oriented to person, place, and time. Coordination normal.  Skin: No rash noted. No erythema.  Psychiatric: Her behavior is normal. Her mood appears anxious.    ED Course  Procedures (including critical care time)  DIAGNOSTIC STUDIES: Oxygen Saturation is 99% on room air, normal by my interpretation.    COORDINATION OF CARE:  09:10-Discussed planned course of treatment with the patient including IV fluids, pain and nausea management, a chest x-ray and blood work, who is agreeable at this time.   09:15-Medication Orders: Sodium chloride 0.9% bolus 500 mL-once; Hydromorphone (Dilaudid) injection 1 mg-once; Ondansetron (Zofran) injection 4 mg-once; Lorazepam (Ativan) injection 0.5 mg-once.   Results for orders placed during the hospital encounter of 11/01/12  CBC WITH DIFFERENTIAL      Result Value Range   WBC 6.6  4.0 - 10.5 K/uL   RBC 3.98  3.87 - 5.11 MIL/uL   Hemoglobin 12.1  12.0 - 15.0 g/dL   HCT 35.5 (*) 36.0 - 46.0 %   MCV 89.2  78.0 - 100.0 fL   MCH 30.4  26.0 - 34.0 pg   MCHC 34.1  30.0 - 36.0 g/dL   RDW 13.0  11.5 - 15.5 %   Platelets 207  150 - 400 K/uL   Neutrophils Relative 81 (*) 43 - 77 %   Neutro Abs 5.3  1.7 - 7.7 K/uL   Lymphocytes Relative 18  12 - 46 %   Lymphs Abs 1.2  0.7 - 4.0 K/uL   Monocytes Relative 1 (*) 3 - 12 %   Monocytes Absolute 0.1  0.1 - 1.0 K/uL   Eosinophils Relative 0  0 - 5 %   Eosinophils Absolute 0.0  0.0 - 0.7 K/uL   Basophils Relative 0  0 - 1 %   Basophils Absolute 0.0  0.0 - 0.1 K/uL  COMPREHENSIVE METABOLIC PANEL      Result Value Range   Sodium 134 (*) 135 - 145 mEq/L   Potassium 3.9  3.5 - 5.1 mEq/L   Chloride 98  96 - 112 mEq/L   CO2 22  19 - 32 mEq/L   Glucose, Bld 270 (*) 70 - 99 mg/dL   BUN 23  6 - 23 mg/dL   Creatinine, Ser 1.02  0.50 - 1.10 mg/dL   Calcium 9.6  8.4 - 10.5 mg/dL   Total Protein 7.8  6.0 - 8.3 g/dL   Albumin 3.8  3.5  - 5.2 g/dL   AST 1246 (*) 0 - 37 U/L   ALT 511 (*) 0 - 35 U/L  Alkaline Phosphatase 138 (*) 39 - 117 U/L   Total Bilirubin 1.0  0.3 - 1.2 mg/dL   GFR calc non Af Amer 51 (*) >90 mL/min   GFR calc Af Amer 59 (*) >90 mL/min  LIPASE, BLOOD      Result Value Range   Lipase 26  11 - 59 U/L  TROPONIN I      Result Value Range   Troponin I <0.30  <0.30 ng/mL    Ct Abdomen Pelvis W Contrast  11/01/2012  *RADIOLOGY REPORT*  Clinical Data: Abdominal pain, nausea/vomiting  CT ABDOMEN AND PELVIS WITH CONTRAST  Technique:  Multidetector CT imaging of the abdomen and pelvis was performed following the standard protocol during bolus administration of intravenous contrast.  Contrast: 100 ml Omnipaque-300 IV  Comparison: None.  Findings: Mild dependent atelectasis lung bases.  Cardiomegaly.  Liver, spleen, pancreas, and adrenal glands are within normal limits.  Gallbladder distension with layering small gallstones.  No associated inflammatory changes by CT.  Mild intrahepatic/extrahepatic ductal dilatation.  Common duct measures 9 mm (coronal image 50).  1.4 cm interpolar right renal cyst (series 7/image 24).  No hydronephrosis.  No evidence of bowel obstruction.  Normal appendix.  Atherosclerotic calcifications of the abdominal aorta and branch vessels.  No abdominopelvic ascites.  No suspicious abdominopelvic lymphadenopathy.  Uterus and bilateral ovaries unremarkable.  Bladder is within normal limits.  Degenerative changes of the visualized thoracolumbar spine.  Mild to moderate superior endplate deformity at 624THL.  IMPRESSION: Cholelithiasis gallbladder distension.  No associated inflammatory changes by CT.  Mild intrahepatic/extrahepatic ductal dilatation.  Common duct measures 9 mm.  In the setting of abnormal LFTs, consider ERCP or MRCP to evaluate for choledocholithiasis.   Original Report Authenticated By: Julian Hy, M.D.    Dg Chest Portable 1 View  11/01/2012  *RADIOLOGY REPORT*  Clinical Data:  Shortness of breath, vomiting.  PORTABLE CHEST - 1 VIEW  Comparison: None.  Findings: Heart is borderline enlarged.  Lungs are clear.  No effusions or edema.  No acute bony abnormality.  IMPRESSION: Borderline cardiomegaly.  No acute findings.   Original Report Authenticated By: Rolm Baptise, M.D.      No diagnosis found.    MDM   Med to admit and surgery consult  The chart was scribed for me under my direct supervision.  I personally performed the history, physical, and medical decision making and all procedures in the evaluation of this patient.Laura Diego, MD 11/01/12 [id number]

## 2012-11-02 ENCOUNTER — Inpatient Hospital Stay (HOSPITAL_COMMUNITY): Payer: Medicare Other

## 2012-11-02 ENCOUNTER — Encounter (HOSPITAL_COMMUNITY): Payer: Self-pay | Admitting: Urgent Care

## 2012-11-02 DIAGNOSIS — K838 Other specified diseases of biliary tract: Secondary | ICD-10-CM

## 2012-11-02 DIAGNOSIS — K819 Cholecystitis, unspecified: Secondary | ICD-10-CM

## 2012-11-02 DIAGNOSIS — R7989 Other specified abnormal findings of blood chemistry: Secondary | ICD-10-CM

## 2012-11-02 DIAGNOSIS — R109 Unspecified abdominal pain: Secondary | ICD-10-CM

## 2012-11-02 DIAGNOSIS — R112 Nausea with vomiting, unspecified: Secondary | ICD-10-CM

## 2012-11-02 LAB — GLUCOSE, CAPILLARY
Glucose-Capillary: 103 mg/dL — ABNORMAL HIGH (ref 70–99)
Glucose-Capillary: 86 mg/dL (ref 70–99)
Glucose-Capillary: 136 mg/dL — ABNORMAL HIGH (ref 70–99)
Glucose-Capillary: 115 mg/dL — ABNORMAL HIGH (ref 70–99)

## 2012-11-02 LAB — HEPATIC FUNCTION PANEL
ALT: 385 U/L — ABNORMAL HIGH (ref 0–35)
AST: 343 U/L — ABNORMAL HIGH (ref 0–37)
Albumin: 3.1 g/dL — ABNORMAL LOW (ref 3.5–5.2)
Alkaline Phosphatase: 121 U/L — ABNORMAL HIGH (ref 39–117)
Bilirubin, Direct: 0.3 mg/dL (ref 0.0–0.3)
Indirect Bilirubin: 1 mg/dL — ABNORMAL HIGH (ref 0.3–0.9)
Total Bilirubin: 1.3 mg/dL — ABNORMAL HIGH (ref 0.3–1.2)
Total Protein: 6.7 g/dL (ref 6.0–8.3)

## 2012-11-02 MED ORDER — GADOBENATE DIMEGLUMINE 529 MG/ML IV SOLN
15.0000 mL | Freq: Once | INTRAVENOUS | Status: AC | PRN
  Administered 2012-11-02: 15 mL via INTRAVENOUS

## 2012-11-02 MED ORDER — DEXTROSE 5 % IV SOLN
1.0000 g | INTRAVENOUS | Status: DC
  Administered 2012-11-02 – 2012-11-04 (×3): 1 g via INTRAVENOUS
  Filled 2012-11-02 (×3): qty 10

## 2012-11-02 MED ORDER — DEXTROSE 5 % IV SOLN
INTRAVENOUS | Status: AC
  Filled 2012-11-02: qty 10

## 2012-11-02 NOTE — Consult Note (Addendum)
Referring Provider: Dr Arnoldo Morale Primary Care Physician:  [name], MD Primary Gastroenterologist:  Dr. Gala Romney  Reason for Consultation:  Upper abdominal pain, N/V  HPI: Laura Davenport is a 79 y.o. female admitted with upper abdominal pain, nausea, & vomiting.  Yesterday morning 5am, she woke up with severe pain across top of stomach.  Pain sharp then aching 10/10.  Pain radiated from LUQ to RUQ.  No radiation to back.  Hours after the pain, she became shaky at 8am.  On the way to hospital, began vomiting food from night before.  Looked like OJ she had that morning.  She had tossed salad & chicken from [employer]   Pain gone once she arrived at hospital.  Denies fever.  +chills.    Denies constipation, diarrhea, rectal bleeding, melena or weight loss.  Denies heartburn, indigestion, dysphagia, odynophagia or anorexia. Denies jaundice, pruritis or clay colored stools.  Transaminases elevated significantly.  Bilirubin normal.  CT shows cholelithiasis, CBD 97mm with intra/extra-hepatic dilation, & distended gallbladder.  Denies ill contacts.     Past Medical History  Diagnosis Date  . Diabetes mellitus   . Hypertension     History reviewed. No pertinent past surgical history.  Prior to Admission medications   Medication Sig Start Date End Date Taking? Authorizing Provider  acetaminophen (TYLENOL) 500 MG tablet Take 500 mg by mouth as needed. For headache pain   Yes Historical Provider, MD  amLODipine-olmesartan (AZOR) 5-40 MG per tablet Take 1 tablet by mouth daily.   Yes [name], MD  aspirin EC 81 MG tablet Take 81 mg by mouth every morning.    Yes Historical Provider, MD  atorvastatin (LIPITOR) 40 MG tablet Take 40 mg by mouth daily.   Yes [name], MD  hydrochlorothiazide (HYDRODIURIL) 25 MG tablet Take 25 mg by mouth daily.   Yes [name], MD    Current Facility-Administered Medications  Medication Dose Route Frequency Provider Last Rate Last Dose  . 0.45 % sodium chloride infusion    Intravenous Continuous Lanette Hampshire, MD 100 mL/hr at 11/02/12 0348    . acetaminophen (TYLENOL) tablet 650 mg  650 mg Oral Q6H PRN Lanette Hampshire, MD   650 mg at 11/01/12 2100  . aspirin EC tablet 81 mg  81 mg Oral Daily Angus G McInnis, MD      . atorvastatin (LIPITOR) tablet 40 mg  40 mg Oral Daily Angus Ailene Ravel, MD   40 mg at 11/01/12 1711  . enoxaparin (LOVENOX) injection 40 mg  40 mg Subcutaneous Q24H Angus G McInnis, MD   40 mg at 11/01/12 1711  . hydrochlorothiazide (HYDRODIURIL) tablet 25 mg  25 mg Oral Daily Angus Ailene Ravel, MD   25 mg at 11/01/12 1711  . insulin aspart (novoLOG) injection 0-15 Units  0-15 Units Subcutaneous TID WC Angus Ailene Ravel, MD   5 Units at 11/01/12 1710  . insulin aspart (novoLOG) injection 0-5 Units  0-5 Units Subcutaneous QHS Lanette Hampshire, MD        Allergies as of 11/01/2012  . (No Known Allergies)    History reviewed. No pertinent family history. There is no known family history of colorectal carcinoma , liver disease, or inflammatory bowel disease.   History   Social History  . Marital Status: Widowed    Spouse Name: N/A    Number of Children: 41  . Years of Education: N/A   Occupational History  . retired; [employer] History  Main Topics  . Smoking status: Never Smoker   . Smokeless tobacco: Not on file  . Alcohol Use: No  . Drug Use: No  . Sexually Active: Not on file   Other Topics Concern  . Not on file   Social History Narrative   Lives alone   3 daughters & granddaughter nearby   Lost 1 son-strep          Review of Systems: Gen: see HPI CV: Denies chest pain, angina, palpitations, syncope, orthopnea, PND, peripheral edema, and claudication. Resp: Denies dyspnea at rest, dyspnea with exercise, cough, sputum, wheezing, coughing up blood, and pleurisy. GI: See HPI GU : Denies urinary burning, blood in urine, urinary frequency, urinary hesitancy, nocturnal urination, and urinary incontinence. MS: Denies  joint pain, limitation of movement, and swelling, stiffness, low back pain, extremity pain. Denies muscle weakness, cramps, atrophy.  Derm: Denies rash, itching, dry skin, hives, moles, warts, or unhealing ulcers.  Psych: Denies depression, anxiety, memory loss, suicidal ideation, hallucinations, paranoia, and confusion. Heme: Denies bruising, bleeding, and enlarged lymph nodes. Neuro:  Denies any headaches, dizziness, paresthesias. Endo:  Denies any problems with DM, thyroid, adrenal function.  Physical Exam: Vital signs in last 24 hours: Temp:  [97.7 F (36.5 C)-102.9 F (39.4 C)] 99.8 F (37.7 C) (03/03 0527) Pulse Rate:  [86-118] 86 (03/03 0527) Resp:  [16-22] 20 (03/03 0527) BP: (95-131)/(48-64) 101/64 mmHg (03/03 0527) SpO2:  [96 %-100 %] 99 % (03/03 0527) Weight:  [200 lb 9.9 oz (91 kg)] 200 lb 9.9 oz (91 kg) (03/02 1538) Last BM Date: 11/01/12 No LMP recorded. Patient is postmenopausal. General:   Alert,  Well-developed, well-nourished, pleasant and cooperative in NAD.  Multiple family members at bedside.  Head:  Normocephalic and atraumatic. Eyes:  Sclera clear, no icterus.   Conjunctiva pink. Ears:  Normal auditory acuity. Nose:  No deformity, discharge, or lesions. Mouth:  No deformity or lesions,oropharynx pink & moist. Neck:  Supple; no masses or thyromegaly. Lungs:  Clear throughout to auscultation.   No wheezes, crackles, or rhonchi. No acute distress. Heart:  Regular rate and rhythm; no murmurs, clicks, rubs,  or gallops. Abdomen:  Normal bowel sounds.  No bruits.  Soft, non-tender and non-distended without masses, hepatosplenomegaly or hernias noted.  No guarding or rebound tenderness.   Rectal:  Deferred. Msk:  Symmetrical without gross deformities. Normal posture. Pulses:  Normal pulses noted. Extremities:  No clubbing or edema. Neurologic:  Alert and oriented x4;  grossly normal neurologically. Skin:  Intact without significant lesions or rashes. Lymph Nodes:   No significant cervical adenopathy. Psych:  Alert and cooperative. Normal mood and affect.  Intake/Output from previous day: 03/02 0701 - 03/03 0700 In: 1425 [P.O.:240; I.V.:1185] Out: 3 [Urine:3] Intake/Output this shift:    Lab Results:  Recent Labs  11/01/12 0814  WBC 6.6  HGB 12.1  HCT 35.5*  PLT 207   BMET  Recent Labs  11/01/12 0814  NA 134*  K 3.9  CL 98  CO2 22  GLUCOSE 270*  BUN 23  CREATININE 1.02  CALCIUM 9.6   LFT  Recent Labs  11/01/12 0814  PROT 7.8  ALBUMIN 3.8  AST 1246*  ALT 511*  ALKPHOS 138*  BILITOT 1.0  LIPASE 26   Studies/Results: Ct Abdomen Pelvis W Contrast  11/01/2012  *RADIOLOGY REPORT*  Clinical Data: Abdominal pain, nausea/vomiting  CT ABDOMEN AND PELVIS WITH CONTRAST  Technique:  Multidetector CT imaging of the abdomen and pelvis was performed following the  standard protocol during bolus administration of intravenous contrast.  Contrast: 100 ml Omnipaque-300 IV  Comparison: None.  Findings: Mild dependent atelectasis lung bases.  Cardiomegaly.  Liver, spleen, pancreas, and adrenal glands are within normal limits.  Gallbladder distension with layering small gallstones.  No associated inflammatory changes by CT.  Mild intrahepatic/extrahepatic ductal dilatation.  Common duct measures 9 mm (coronal image 50).  1.4 cm interpolar right renal cyst (series 7/image 24).  No hydronephrosis.  No evidence of bowel obstruction.  Normal appendix.  Atherosclerotic calcifications of the abdominal aorta and branch vessels.  No abdominopelvic ascites.  No suspicious abdominopelvic lymphadenopathy.  Uterus and bilateral ovaries unremarkable.  Bladder is within normal limits.  Degenerative changes of the visualized thoracolumbar spine.  Mild to moderate superior endplate deformity at 624THL.  IMPRESSION: Cholelithiasis gallbladder distension.  No associated inflammatory changes by CT.  Mild intrahepatic/extrahepatic ductal dilatation.  Common duct measures 9 mm.   In the setting of abnormal LFTs, consider ERCP or MRCP to evaluate for choledocholithiasis.   Original Report Authenticated By: Julian Hy, M.D.    Dg Chest Portable 1 View  11/01/2012  *RADIOLOGY REPORT*  Clinical Data: Shortness of breath, vomiting.  PORTABLE CHEST - 1 VIEW  Comparison: None.  Findings: Heart is borderline enlarged.  Lungs are clear.  No effusions or edema.  No acute bony abnormality.  IMPRESSION: Borderline cardiomegaly.  No acute findings.   Original Report Authenticated By: Rolm Baptise, M.D.     Impression: Laura Davenport is a pleasant 79 y.o. female admitted with acute upper abdominal pain, nausea, vomiting, & transaminitis.  CT A/P shows 70mm CBD with intra/extraheptaic biliary dilation & cholelithiasis with distended gallbladder.  Differentials include passage of choledocholithiasis, acute hepatitis or gastroenteritis.     Plan: 1. Recheck LFTS STAT 2. Acute hepatitis panel 3. Continue supportive measures 4. If LFTS remain elevated, consider MRCP/ERCP   LOS: 1 day   Vickey Huger  11/02/2012, 9:40 AM [facility]  Patient seen and examined at 1500. Her abdominal  pain and nausea have resolved. She is hungry and wants to eat. Repeat LFTs this morning show an AST of 343 and ALT of 385; total bilirubin 1.3. MRCP reviewed; CBD nondilated (7 mm), cholelithiasis but no choledocholithiasis.  The presenting scenario is most consistent with passage of common bile duct stone. Enzyme pattern and clinical course not consistent with ischemia or acute hepatitis.   ERCP not needed at this time.  She would likely benefit from elective cholecystectomy.  Diet  Advanced. Will repeat LFTs tomorrow morning-need to follow them down to complete normalization.

## 2012-11-02 NOTE — Progress Notes (Signed)
UR Chart Review Completed  

## 2012-11-02 NOTE — Progress Notes (Signed)
  Nutrition Brief Note  Patient identified on the Malnutrition Screening Tool (MST) Report  Body mass index is 31.41 kg/(m^2). Patient meets criteria for Obesity Class II based on current BMI.   Current diet order is NPO, for MRCP per RN. She consumed 75% of dinner last night and is c/o being hungry today.  Labs and medications reviewed.  Pt was followed by outpatient RD on 11/20/11. Her wt at that time 189# compared to current wt which has increased additional 11# over past year..   No nutrition interventions warranted at this time. If nutrition issues arise, please consult RD.   Colman Cater MS,RD,LDN,CSG Office: [phone] Pager: [phone]

## 2012-11-02 NOTE — Consult Note (Signed)
Reason for Consult: Cholelithiasis Referring Physician: [name], M.D.  Laura Davenport is an 79 y.o. female.  HPI: 79 year old black female who was in her usual state of health who presented to emergency room with worsening right upper quadrant and epigastric abdominal pain. CT scan of the abdomen revealed cholelithiasis without evidence of inflammation. Her liver enzyme tests were noted in the markedly elevated, though she had a normal total bilirubin. She denies any history of biliary colic, jaundice, or fatty food intolerance.  Past Medical History  Diagnosis Date  . Diabetes mellitus   . Hypertension     History reviewed. No pertinent past surgical history.  History reviewed. No pertinent family history.  Social History:  reports that she has never smoked. She does not have any smokeless tobacco history on file. She reports that she does not drink alcohol or use illicit drugs.  Allergies: No Known Allergies  Medications: I have reviewed the patient's current medications.  Results for orders placed during the hospital encounter of 11/01/12 (from the past 48 hour(s))  CBC WITH DIFFERENTIAL     Status: Abnormal   Collection Time    11/01/12  8:14 AM      Result Value Range   WBC 6.6  4.0 - 10.5 K/uL   RBC 3.98  3.87 - 5.11 MIL/uL   Hemoglobin 12.1  12.0 - 15.0 g/dL   HCT 35.5 (*) 36.0 - 46.0 %   MCV 89.2  78.0 - 100.0 fL   MCH 30.4  26.0 - 34.0 pg   MCHC 34.1  30.0 - 36.0 g/dL   RDW 13.0  11.5 - 15.5 %   Platelets 207  150 - 400 K/uL   Neutrophils Relative 81 (*) 43 - 77 %   Neutro Abs 5.3  1.7 - 7.7 K/uL   Lymphocytes Relative 18  12 - 46 %   Lymphs Abs 1.2  0.7 - 4.0 K/uL   Monocytes Relative 1 (*) 3 - 12 %   Monocytes Absolute 0.1  0.1 - 1.0 K/uL   Eosinophils Relative 0  0 - 5 %   Eosinophils Absolute 0.0  0.0 - 0.7 K/uL   Basophils Relative 0  0 - 1 %   Basophils Absolute 0.0  0.0 - 0.1 K/uL  COMPREHENSIVE METABOLIC PANEL     Status: Abnormal   Collection Time    11/01/12  8:14 AM      Result Value Range   Sodium 134 (*) 135 - 145 mEq/L   Potassium 3.9  3.5 - 5.1 mEq/L   Chloride 98  96 - 112 mEq/L   CO2 22  19 - 32 mEq/L   Glucose, Bld 270 (*) 70 - 99 mg/dL   BUN 23  6 - 23 mg/dL   Creatinine, Ser 1.02  0.50 - 1.10 mg/dL   Calcium 9.6  8.4 - 10.5 mg/dL   Total Protein 7.8  6.0 - 8.3 g/dL   Albumin 3.8  3.5 - 5.2 g/dL   AST 1246 (*) 0 - 37 U/L   ALT 511 (*) 0 - 35 U/L   Alkaline Phosphatase 138 (*) 39 - 117 U/L   Total Bilirubin 1.0  0.3 - 1.2 mg/dL   GFR calc non Af Amer 51 (*) >90 mL/min   GFR calc Af Amer 59 (*) >90 mL/min   Comment:            The eGFR has been calculated     using the CKD EPI equation.  This calculation has not been     validated in all clinical     situations.     eGFR's persistently     <90 mL/min signify     possible Chronic Kidney Disease.  LIPASE, BLOOD     Status: None   Collection Time    11/01/12  8:14 AM      Result Value Range   Lipase 26  11 - 59 U/L  TROPONIN I     Status: None   Collection Time    11/01/12  8:14 AM      Result Value Range   Troponin I <0.30  <0.30 ng/mL   Comment:            Due to the release kinetics of cTnI,     a negative result within the first hours     of the onset of symptoms does not rule out     myocardial infarction with certainty.     If myocardial infarction is still suspected,     repeat the test at appropriate intervals.  GLUCOSE, CAPILLARY     Status: Abnormal   Collection Time    11/01/12  4:35 PM      Result Value Range   Glucose-Capillary 208 (*) 70 - 99 mg/dL   Comment 1 Notify RN     Comment 2 Documented in Chart    GLUCOSE, CAPILLARY     Status: Abnormal   Collection Time    11/01/12  8:44 PM      Result Value Range   Glucose-Capillary 106 (*) 70 - 99 mg/dL  GLUCOSE, CAPILLARY     Status: Abnormal   Collection Time    11/02/12  7:23 AM      Result Value Range   Glucose-Capillary 103 (*) 70 - 99 mg/dL   Comment 1 Notify RN      Ct  Abdomen Pelvis W Contrast  11/01/2012  *RADIOLOGY REPORT*  Clinical Data: Abdominal pain, nausea/vomiting  CT ABDOMEN AND PELVIS WITH CONTRAST  Technique:  Multidetector CT imaging of the abdomen and pelvis was performed following the standard protocol during bolus administration of intravenous contrast.  Contrast: 100 ml Omnipaque-300 IV  Comparison: None.  Findings: Mild dependent atelectasis lung bases.  Cardiomegaly.  Liver, spleen, pancreas, and adrenal glands are within normal limits.  Gallbladder distension with layering small gallstones.  No associated inflammatory changes by CT.  Mild intrahepatic/extrahepatic ductal dilatation.  Common duct measures 9 mm (coronal image 50).  1.4 cm interpolar right renal cyst (series 7/image 24).  No hydronephrosis.  No evidence of bowel obstruction.  Normal appendix.  Atherosclerotic calcifications of the abdominal aorta and branch vessels.  No abdominopelvic ascites.  No suspicious abdominopelvic lymphadenopathy.  Uterus and bilateral ovaries unremarkable.  Bladder is within normal limits.  Degenerative changes of the visualized thoracolumbar spine.  Mild to moderate superior endplate deformity at 624THL.  IMPRESSION: Cholelithiasis gallbladder distension.  No associated inflammatory changes by CT.  Mild intrahepatic/extrahepatic ductal dilatation.  Common duct measures 9 mm.  In the setting of abnormal LFTs, consider ERCP or MRCP to evaluate for choledocholithiasis.   Original Report Authenticated By: Julian Hy, M.D.    Dg Chest Portable 1 View  11/01/2012  *RADIOLOGY REPORT*  Clinical Data: Shortness of breath, vomiting.  PORTABLE CHEST - 1 VIEW  Comparison: None.  Findings: Heart is borderline enlarged.  Lungs are clear.  No effusions or edema.  No acute bony abnormality.  IMPRESSION: Borderline cardiomegaly.  No  acute findings.   Original Report Authenticated By: Rolm Baptise, M.D.     ROS: See chart Blood pressure 101/64, pulse 86, temperature 99.8 F (37.7  C), temperature source Oral, resp. rate 20, height 5\' 7"  (1.702 m), weight 91 kg (200 lb 9.9 oz), SpO2 99.00%. Physical Exam: Well-developed well-nourished black female in no acute distress. Abdomen is soft with minimal tenderness in the right upper quadrant to deep palpation. No hepatosplenomegaly, masses, or hernias identified.  Assessment/Plan: Impression: Cholelithiasis in the face of significantly elevated liver enzyme test. It is difficult to determine whether or the gallstones are the primary etiology for these abnormal liver tests. We'll get ultrasound the gallbladder. Also get GI involved. Further management is pending those results.  JENKINS,MARK A 11/02/2012, 9:47 AM

## 2012-11-02 NOTE — Progress Notes (Signed)
Inpatient Diabetes Program Recommendations  AACE/ADA: New Consensus Statement on Inpatient Glycemic Control (2013)  Target Ranges:  Prepandial:   less than 140 mg/dL      Peak postprandial:   less than 180 mg/dL (1-2 hours)      Critically ill patients:  140 - 180 mg/dL   Results for Laura Davenport, Laura Davenport (MRN [id number]) as of 11/02/2012 08:23  Ref. Range 11/01/2012 08:14  Glucose Latest Range: 70-99 mg/dL 270 (H)  Results for Laura Davenport, Laura Davenport (MRN [id number]) as of 11/02/2012 08:23  Ref. Range 11/01/2012 16:35  Glucose-Capillary Latest Range: 70-99 mg/dL 208 (H)    Inpatient Diabetes Program Recommendations HgbA1C: Please consider ordering an A1C to determine glycemic control over the past 2-3 months.  Note: Patient has a history of diabetes but does not take any diabetic medications at home for diabetes management.  Currently, patient is ordered to receive Novolog moderate correction ACHS for inpatient glycemic control. Fasting lab glucose yesterday morning was 270 mg/dl and the initial finger stick was 208 mg/dl.  There are not any A1C values in the chart.  Please consider ordering and A1C to determine glycemic control over the last 2-3 months.  Will continue to follow.  Thanks, Barnie Alderman, RN, BSN, [place] [facility] Coordinator Inpatient Diabetes Program [phone]

## 2012-11-02 NOTE — Care Management Note (Signed)
    Page 1 of 1   11/05/2012     10:31:46 AM   CARE MANAGEMENT NOTE 11/05/2012  Patient:  ZANIYA, SHINOHARA   Account Number:  [id number]  Date Initiated:  11/02/2012  Documentation initiated by:  Theophilus Kinds  Subjective/Objective Assessment:   PT admitted from home with abd pain. Pt lives alone but has 3 children and grandchild that lives close by. Pt is independent with ADL's.     Action/Plan:   No CM needs anticipated.   Anticipated DC Date:  11/04/2012   Anticipated DC Plan:  [place]  CM consult      Choice offered to / List presented to:             Status of service:  Completed, signed off Medicare Important Message given?  YES (If response is "NO", the following Medicare IM given date fields will be blank) Date Medicare IM given:  11/05/2012 Date Additional Medicare IM given:    Discharge Disposition:  HOME/SELF CARE  Per UR Regulation:    If discussed at Long Length of Stay Meetings, dates discussed:    Comments:  11/05/12 [place], RN BSN CM Pt discharged home today. No CM needs noted.  11/02/12 [place], RN BSN CM

## 2012-11-03 DIAGNOSIS — R7401 Elevation of levels of liver transaminase levels: Secondary | ICD-10-CM

## 2012-11-03 DIAGNOSIS — R740 Nonspecific elevation of levels of transaminase and lactic acid dehydrogenase [LDH]: Secondary | ICD-10-CM

## 2012-11-03 LAB — URINALYSIS, ROUTINE W REFLEX MICROSCOPIC
Bilirubin Urine: NEGATIVE
Glucose, UA: NEGATIVE mg/dL
Hgb urine dipstick: NEGATIVE
Ketones, ur: NEGATIVE mg/dL
Nitrite: NEGATIVE
Protein, ur: NEGATIVE mg/dL
Specific Gravity, Urine: 1.01 (ref 1.005–1.030)
Urobilinogen, UA: 1 mg/dL (ref 0.0–1.0)
pH: 6 (ref 5.0–8.0)

## 2012-11-03 LAB — HEPATIC FUNCTION PANEL
ALT: 258 U/L — ABNORMAL HIGH (ref 0–35)
AST: 148 U/L — ABNORMAL HIGH (ref 0–37)
Albumin: 3 g/dL — ABNORMAL LOW (ref 3.5–5.2)
Alkaline Phosphatase: 110 U/L (ref 39–117)
Bilirubin, Direct: 0.2 mg/dL (ref 0.0–0.3)
Indirect Bilirubin: 0.7 mg/dL (ref 0.3–0.9)
Total Bilirubin: 0.9 mg/dL (ref 0.3–1.2)
Total Protein: 6.6 g/dL (ref 6.0–8.3)

## 2012-11-03 LAB — GLUCOSE, CAPILLARY
Glucose-Capillary: 127 mg/dL — ABNORMAL HIGH (ref 70–99)
Glucose-Capillary: 121 mg/dL — ABNORMAL HIGH (ref 70–99)
Glucose-Capillary: 155 mg/dL — ABNORMAL HIGH (ref 70–99)
Glucose-Capillary: 141 mg/dL — ABNORMAL HIGH (ref 70–99)

## 2012-11-03 LAB — URINE MICROSCOPIC-ADD ON

## 2012-11-03 LAB — HEPATITIS PANEL, ACUTE
HCV Ab: NEGATIVE
Hep A IgM: NEGATIVE
Hep B C IgM: NEGATIVE
Hepatitis B Surface Ag: NEGATIVE

## 2012-11-03 MED ORDER — PANTOPRAZOLE SODIUM 40 MG PO TBEC
40.0000 mg | DELAYED_RELEASE_TABLET | Freq: Every day | ORAL | Status: DC
  Administered 2012-11-03 – 2012-11-05 (×2): 40 mg via ORAL
  Filled 2012-11-03 (×2): qty 1

## 2012-11-03 NOTE — Progress Notes (Signed)
NAMETROYA, CIANCIULLI                ACCOUNT NO.:  [id number]  MEDICAL RECORD NO.:  [id number]  LOCATION:                                 FACILITY:  PHYSICIAN:  Paula Compton. Willey Blade, MD       DATE OF BIRTH:  03-06-1931  DATE OF PROCEDURE:  11/02/2012 DATE OF DISCHARGE:                                PROGRESS NOTE   Laura Davenport was admitted yesterday by Dr. Everette Rank with right upper quadrant pain and vomiting.  She had elevated transaminases with an AST of 1246, ALT of 511, her alkaline phosphatase was 138.  She underwent a CT scan of the abdomen, which revealed  cholelithiasis and gallbladder distention.  She was seen in surgical consultation by Dr. Arnoldo Morale earlier who requested Gastroenterology consultation.  She has now had Gastroenterology consultation and has undergone an MRCP.  Her MRCP revealed multiple tiny gallstones with a distended gallbladder.  There was no evidence of gallbladder wall thickening.  There was no definite evidence of choledocholithiasis.  Her transaminases today have decreased to an AST of 343 and ALT of 385.  Her alkaline phosphatase has dropped to 121, bilirubin was initially 1.0 with a repeat of 1.3.  Her white count was 6.6 yesterday.  She has also had an ultrasound performed today which revealed cholelithiasis without evidence of cholecystitis.  She had mild fatty infiltration of the liver.  She had a fever yesterday of 102.9, but has been afebrile today.  PHYSICAL EXAMINATION:  GENERAL:  She is alert and oriented and in no distress. LUNGS:  Clear. HEART:  Regular with no murmurs. ABDOMEN:  Soft and nontender with no palpable organomegaly. EXTREMITIES:  No edema.  IMPRESSION/PLAN: 1. Cholelithiasis with probable cholecystitis and recent passage of a     stone.  Her lipase is normal at 26.  She declines cholecystectomy     at this point, and would like to go home and think about it.  She     will have followup transaminases tomorrow.  Her case has been  discussed with Dr. Arnoldo Morale. 2. Diabetes.  Accu-Cheks today have ranged from 86-136.  Lantus has     been held.  She has received sliding scale NovoLog. 3. Hypertension. 4. Hyperlipidemia.     Paula Compton. Willey Blade, MD    ROF/MEDQ  D:  11/02/2012  T:  11/03/2012  Job:  [id number]

## 2012-11-03 NOTE — Progress Notes (Signed)
Subjective: Denies abdominal pain, nausea, vomiting. No concerns. States debating about cholecystectomy as inpatient vs outpatient.   Objective: Vital signs in last 24 hours: Temp:  [98.3 F (36.8 C)-99.9 F (37.7 C)] 99.3 F (37.4 C) (03/04 0441) Pulse Rate:  [91-113] 99 (03/04 0441) Resp:  [18-20] 18 (03/04 0441) BP: (93-103)/(54-57) 101/54 mmHg (03/04 0441) SpO2:  [97 %-100 %] 97 % (03/04 0441) Last BM Date: 11/02/12 General:   Alert and oriented, pleasant Head:  Normocephalic and atraumatic. Eyes:  No icterus, sclera clear. Conjuctiva pink.  Mouth:  Without lesions, mucosa pink and moist.  Heart:  S1, S2 present, no murmurs noted.  Lungs: Clear to auscultation bilaterally, without wheezing, rales, or rhonchi.  Abdomen:  Bowel sounds present, soft, non-tender, non-distended. No HSM or hernias noted. No rebound or guarding. No masses appreciated  Msk:  Symmetrical without gross deformities. Normal posture. Extremities:  Without clubbing or edema. Neurologic:  Alert and  oriented x4;  grossly normal neurologically. Skin:  Warm and dry, intact without significant lesions.  Psych:  Alert and cooperative. Normal mood and affect.  Intake/Output from previous day: 03/03 0701 - 03/04 0700 In: 1051 [I.V.:1051] Out: 5 [Urine:5] Intake/Output this shift:    Lab Results:  Recent Labs  11/01/12 0814  WBC 6.6  HGB 12.1  HCT 35.5*  PLT 207   BMET  Recent Labs  11/01/12 0814  NA 134*  K 3.9  CL 98  CO2 22  GLUCOSE 270*  BUN 23  CREATININE 1.02  CALCIUM 9.6   LFT  Recent Labs  11/01/12 0814 11/02/12 0948 11/03/12 0442  PROT 7.8 6.7 6.6  ALBUMIN 3.8 3.1* 3.0*  AST 1246* 343* 148*  ALT 511* 385* 258*  ALKPHOS 138* 121* 110  BILITOT 1.0 1.3* 0.9  BILIDIR  --  0.3 0.2  IBILI  --  1.0* 0.7     Studies/Results: Ct Abdomen Pelvis W Contrast  11/01/2012  *RADIOLOGY REPORT*  Clinical Data: Abdominal pain, nausea/vomiting  CT ABDOMEN AND PELVIS WITH CONTRAST   Technique:  Multidetector CT imaging of the abdomen and pelvis was performed following the standard protocol during bolus administration of intravenous contrast.  Contrast: 100 ml Omnipaque-300 IV  Comparison: None.  Findings: Mild dependent atelectasis lung bases.  Cardiomegaly.  Liver, spleen, pancreas, and adrenal glands are within normal limits.  Gallbladder distension with layering small gallstones.  No associated inflammatory changes by CT.  Mild intrahepatic/extrahepatic ductal dilatation.  Common duct measures 9 mm (coronal image 50).  1.4 cm interpolar right renal cyst (series 7/image 24).  No hydronephrosis.  No evidence of bowel obstruction.  Normal appendix.  Atherosclerotic calcifications of the abdominal aorta and branch vessels.  No abdominopelvic ascites.  No suspicious abdominopelvic lymphadenopathy.  Uterus and bilateral ovaries unremarkable.  Bladder is within normal limits.  Degenerative changes of the visualized thoracolumbar spine.  Mild to moderate superior endplate deformity at 624THL.  IMPRESSION: Cholelithiasis gallbladder distension.  No associated inflammatory changes by CT.  Mild intrahepatic/extrahepatic ductal dilatation.  Common duct measures 9 mm.  In the setting of abnormal LFTs, consider ERCP or MRCP to evaluate for choledocholithiasis.   Original Report Authenticated By: Julian Hy, M.D.    Mr 3d Recon At Scanner  11/02/2012  *RADIOLOGY REPORT*  Clinical Data:  Abdominal pain.  Nausea and vomiting.  Elevated liver function tests.  Cholelithiasis and biliary dilatation seen on recent ultrasound.  MRI ABDOMEN WITHOUT AND WITH CONTRAST (MRCP)  Technique:  Multiplanar multisequence MR imaging of the abdomen  was performed without and with contrast, including heavily T2-weighted images of the biliary and pancreatic ducts.  Three-dimensional MR images were rendered by post processing of the original MR data.  Contrast: 41mL MULTIHANCE GADOBENATE DIMEGLUMINE 529 MG/ML IV SOLN   Comparison:  Ultrasound 11/02/2012 and CT on 11/01/2012  Findings:  Multiple tiny gallstones are seen in the dependent portion the gallbladder.  The gallbladder is distended but there is no evidence of significant wall thickening or pericholecystic inflammatory changes.  The common bile duct measures 7 mm in diameter, which is within normal limits for patient age.  There is some image degradation due to respiratory motion artifact, but there is no definite evidence of choledocholithiasis.  No evidence of pancreatic ductal dilatation.  No liver masses are identified.  The pancreas, spleen, and adrenal glands are normal in appearance.  Tiny renal cysts are seen bilaterally, however there is no evidence of renal mass or hydronephrosis.  No other soft tissue masses or lymphadenopathy identified within the abdomen.  No evidence of acute inflammatory process, abnormal fluid collections, or dilated bowel loops.  IMPRESSION:  1.  Multiple tiny gallstones with distended gallbladder.  No evidence of gallbladder wall thickening. 2.  No definite evidence of choledocholithiasis or biliary dilatation.   Original Report Authenticated By: Earle Gell, M.D.    Dg Chest Portable 1 View  11/01/2012  *RADIOLOGY REPORT*  Clinical Data: Shortness of breath, vomiting.  PORTABLE CHEST - 1 VIEW  Comparison: None.  Findings: Heart is borderline enlarged.  Lungs are clear.  No effusions or edema.  No acute bony abnormality.  IMPRESSION: Borderline cardiomegaly.  No acute findings.   Original Report Authenticated By: Rolm Baptise, M.D.    Mr Abd W/wo Cm/mrcp  11/02/2012  *RADIOLOGY REPORT*  Clinical Data:  Abdominal pain.  Nausea and vomiting.  Elevated liver function tests.  Cholelithiasis and biliary dilatation seen on recent ultrasound.  MRI ABDOMEN WITHOUT AND WITH CONTRAST (MRCP)  Technique:  Multiplanar multisequence MR imaging of the abdomen was performed without and with contrast, including heavily T2-weighted images of the biliary  and pancreatic ducts.  Three-dimensional MR images were rendered by post processing of the original MR data.  Contrast: 23mL MULTIHANCE GADOBENATE DIMEGLUMINE 529 MG/ML IV SOLN  Comparison:  Ultrasound 11/02/2012 and CT on 11/01/2012  Findings:  Multiple tiny gallstones are seen in the dependent portion the gallbladder.  The gallbladder is distended but there is no evidence of significant wall thickening or pericholecystic inflammatory changes.  The common bile duct measures 7 mm in diameter, which is within normal limits for patient age.  There is some image degradation due to respiratory motion artifact, but there is no definite evidence of choledocholithiasis.  No evidence of pancreatic ductal dilatation.  No liver masses are identified.  The pancreas, spleen, and adrenal glands are normal in appearance.  Tiny renal cysts are seen bilaterally, however there is no evidence of renal mass or hydronephrosis.  No other soft tissue masses or lymphadenopathy identified within the abdomen.  No evidence of acute inflammatory process, abnormal fluid collections, or dilated bowel loops.  IMPRESSION:  1.  Multiple tiny gallstones with distended gallbladder.  No evidence of gallbladder wall thickening. 2.  No definite evidence of choledocholithiasis or biliary dilatation.   Original Report Authenticated By: Earle Gell, M.D.    US Abdomen Limited Ruq  11/02/2012  *RADIOLOGY REPORT*  Clinical Data:  Cholelithiasis.  LIMITED ABDOMINAL ULTRASOUND - RIGHT UPPER QUADRANT  Comparison:  None.  Findings:  Gallbladder:  Small layering stones are present in the gallbladder. Wall thickness is within normal limits at 2.6 mm.  There is no sonographic Murphy's sign.  Common bile duct:  The common bile duct is at the upper limits of normal for age, 7.8 mm.  No obstructing stone or lesion is evident.  Liver:  Slight increased echogenicity is present.  No focal lesions are present.  There is no free fluid.  IMPRESSION:  1.  Cholelithiasis  without evidence for cholecystitis. 2.  Mild fatty infiltration of the liver.                    Original Report Authenticated By: San Morelle, M.D.     Assessment: 79 year old female admitted with abdominal pain, N/V, transaminitis. MRCP without evidence of choledocholithiasis. CBD 9mm. Likely dealing with passage of CBD stone; pt will need elective cholecystectomy in the future. Transaminases continue to improve. Overall, pt is clinically improved and stable from a GI standpoint. She is debating proceeding with an elective cholecystectomy as inpatient, which she states would be scheduled tomorrow.    Plan: Add PPI for GI prophylaxis Follow LFTs to normalization.    LOS: 2 days   Laban Emperor  11/03/2012, 9:08 AM

## 2012-11-03 NOTE — Progress Notes (Signed)
Laura Davenport, Laura Davenport                ACCOUNT NO.:  [id number]  MEDICAL RECORD NO.:  [id number]  LOCATION:  [facility]                         FACILITY:  [facility]  PHYSICIAN:  Paula Compton. Willey Blade, MD       DATE OF BIRTH:  07-Dec-1930  DATE OF PROCEDURE: DATE OF DISCHARGE:                                PROGRESS NOTE   HISTORY OF PRESENT ILLNESS:  Ms. Emick denies any abdominal pain this morning.  She has not had any further vomiting.  PHYSICAL EXAMINATION:  VITAL SIGNS:  Her temperature is 99.3, pulse 99, respirations 18, blood pressure 101/54. LUNGS:  Clear. HEART:  Regular with no murmurs. ABDOMEN:  Soft, nontender.  IMPRESSION/PLAN: 1. Cholecystitis.  She had a high fever on admission.  Ceftriaxone was     added yesterday.  Her MRCP reveal multiple tiny gallstones with a     distended gallbladder, but no evidence of choledocholithiasis.  She     has been  advised that cholecystectomy would appear appropriate at     this point.  She does not wish to proceed with surgery at this     point, but states she will think about it.  Her transaminases are     improved today.  Her AST has dropped to 148 and ALT has dropped to     258, bilirubin is 0.9. 2. Diabetes.  Accu-Chek this morning is 127.  Lantus is being held.     Paula Compton. Willey Blade, MD     ROF/MEDQ  D:  11/03/2012  T:  11/03/2012  Job:  [id number]

## 2012-11-03 NOTE — Progress Notes (Signed)
REVIEWED.  

## 2012-11-03 NOTE — Progress Notes (Signed)
Subjective: Patient is feeling better. No nausea or vomiting noted.  Objective: Vital signs in last 24 hours: Temp:  [98.3 F (36.8 C)-99.9 F (37.7 C)] 99.3 F (37.4 C) (03/04 0441) Pulse Rate:  [91-113] 99 (03/04 0441) Resp:  [18-20] 18 (03/04 0441) BP: (93-103)/(54-57) 101/54 mmHg (03/04 0441) SpO2:  [97 %-100 %] 97 % (03/04 0441) Last BM Date: 11/02/12  Intake/Output from previous day: 03/03 0701 - 03/04 0700 In: 1051 [I.V.:1051] Out: 5 [Urine:5] Intake/Output this shift:    General appearance: alert, cooperative and appears stated age GI: soft, non-tender; bowel sounds normal; no masses,  no organomegaly  Lab Results:   Recent Labs  11/01/12 0814  WBC 6.6  HGB 12.1  HCT 35.5*  PLT 207   BMET  Recent Labs  11/01/12 0814  NA 134*  K 3.9  CL 98  CO2 22  GLUCOSE 270*  BUN 23  CREATININE 1.02  CALCIUM 9.6   PT/INR No results found for this basename: LABPROT, INR,  in the last 72 hours  Studies/Results: Ct Abdomen Pelvis W Contrast  11/01/2012  *RADIOLOGY REPORT*  Clinical Data: Abdominal pain, nausea/vomiting  CT ABDOMEN AND PELVIS WITH CONTRAST  Technique:  Multidetector CT imaging of the abdomen and pelvis was performed following the standard protocol during bolus administration of intravenous contrast.  Contrast: 100 ml Omnipaque-300 IV  Comparison: None.  Findings: Mild dependent atelectasis lung bases.  Cardiomegaly.  Liver, spleen, pancreas, and adrenal glands are within normal limits.  Gallbladder distension with layering small gallstones.  No associated inflammatory changes by CT.  Mild intrahepatic/extrahepatic ductal dilatation.  Common duct measures 9 mm (coronal image 50).  1.4 cm interpolar right renal cyst (series 7/image 24).  No hydronephrosis.  No evidence of bowel obstruction.  Normal appendix.  Atherosclerotic calcifications of the abdominal aorta and branch vessels.  No abdominopelvic ascites.  No suspicious abdominopelvic lymphadenopathy.   Uterus and bilateral ovaries unremarkable.  Bladder is within normal limits.  Degenerative changes of the visualized thoracolumbar spine.  Mild to moderate superior endplate deformity at 624THL.  IMPRESSION: Cholelithiasis gallbladder distension.  No associated inflammatory changes by CT.  Mild intrahepatic/extrahepatic ductal dilatation.  Common duct measures 9 mm.  In the setting of abnormal LFTs, consider ERCP or MRCP to evaluate for choledocholithiasis.   Original Report Authenticated By: Julian Hy, M.D.    Mr 3d Recon At Scanner  11/02/2012  *RADIOLOGY REPORT*  Clinical Data:  Abdominal pain.  Nausea and vomiting.  Elevated liver function tests.  Cholelithiasis and biliary dilatation seen on recent ultrasound.  MRI ABDOMEN WITHOUT AND WITH CONTRAST (MRCP)  Technique:  Multiplanar multisequence MR imaging of the abdomen was performed without and with contrast, including heavily T2-weighted images of the biliary and pancreatic ducts.  Three-dimensional MR images were rendered by post processing of the original MR data.  Contrast: 21mL MULTIHANCE GADOBENATE DIMEGLUMINE 529 MG/ML IV SOLN  Comparison:  Ultrasound 11/02/2012 and CT on 11/01/2012  Findings:  Multiple tiny gallstones are seen in the dependent portion the gallbladder.  The gallbladder is distended but there is no evidence of significant wall thickening or pericholecystic inflammatory changes.  The common bile duct measures 7 mm in diameter, which is within normal limits for patient age.  There is some image degradation due to respiratory motion artifact, but there is no definite evidence of choledocholithiasis.  No evidence of pancreatic ductal dilatation.  No liver masses are identified.  The pancreas, spleen, and adrenal glands are normal in appearance.  Tiny  renal cysts are seen bilaterally, however there is no evidence of renal mass or hydronephrosis.  No other soft tissue masses or lymphadenopathy identified within the abdomen.  No evidence  of acute inflammatory process, abnormal fluid collections, or dilated bowel loops.  IMPRESSION:  1.  Multiple tiny gallstones with distended gallbladder.  No evidence of gallbladder wall thickening. 2.  No definite evidence of choledocholithiasis or biliary dilatation.   Original Report Authenticated By: Earle Gell, M.D.    Dg Chest Portable 1 View  11/01/2012  *RADIOLOGY REPORT*  Clinical Data: Shortness of breath, vomiting.  PORTABLE CHEST - 1 VIEW  Comparison: None.  Findings: Heart is borderline enlarged.  Lungs are clear.  No effusions or edema.  No acute bony abnormality.  IMPRESSION: Borderline cardiomegaly.  No acute findings.   Original Report Authenticated By: Rolm Baptise, M.D.    Mr Abd W/wo Cm/mrcp  11/02/2012  *RADIOLOGY REPORT*  Clinical Data:  Abdominal pain.  Nausea and vomiting.  Elevated liver function tests.  Cholelithiasis and biliary dilatation seen on recent ultrasound.  MRI ABDOMEN WITHOUT AND WITH CONTRAST (MRCP)  Technique:  Multiplanar multisequence MR imaging of the abdomen was performed without and with contrast, including heavily T2-weighted images of the biliary and pancreatic ducts.  Three-dimensional MR images were rendered by post processing of the original MR data.  Contrast: 57mL MULTIHANCE GADOBENATE DIMEGLUMINE 529 MG/ML IV SOLN  Comparison:  Ultrasound 11/02/2012 and CT on 11/01/2012  Findings:  Multiple tiny gallstones are seen in the dependent portion the gallbladder.  The gallbladder is distended but there is no evidence of significant wall thickening or pericholecystic inflammatory changes.  The common bile duct measures 7 mm in diameter, which is within normal limits for patient age.  There is some image degradation due to respiratory motion artifact, but there is no definite evidence of choledocholithiasis.  No evidence of pancreatic ductal dilatation.  No liver masses are identified.  The pancreas, spleen, and adrenal glands are normal in appearance.  Tiny renal cysts  are seen bilaterally, however there is no evidence of renal mass or hydronephrosis.  No other soft tissue masses or lymphadenopathy identified within the abdomen.  No evidence of acute inflammatory process, abnormal fluid collections, or dilated bowel loops.  IMPRESSION:  1.  Multiple tiny gallstones with distended gallbladder.  No evidence of gallbladder wall thickening. 2.  No definite evidence of choledocholithiasis or biliary dilatation.   Original Report Authenticated By: Earle Gell, M.D.    US Abdomen Limited Ruq  11/02/2012  *RADIOLOGY REPORT*  Clinical Data:  Cholelithiasis.  LIMITED ABDOMINAL ULTRASOUND - RIGHT UPPER QUADRANT  Comparison:  None.  Findings:  Gallbladder:  Small layering stones are present in the gallbladder. Wall thickness is within normal limits at 2.6 mm.  There is no sonographic Murphy's sign.  Common bile duct:  The common bile duct is at the upper limits of normal for age, 7.8 mm.  No obstructing stone or lesion is evident.  Liver:  Slight increased echogenicity is present.  No focal lesions are present.  There is no free fluid.  IMPRESSION:  1.  Cholelithiasis without evidence for cholecystitis. 2.  Mild fatty infiltration of the liver.                    Original Report Authenticated By: San Morelle, M.D.     Anti-infectives: Anti-infectives   Start     Dose/Rate Route Frequency Ordered Stop   11/02/12 2230  cefTRIAXone (ROCEPHIN) 1 g in  dextrose 5 % 50 mL IVPB     1 g 100 mL/hr over 30 Minutes Intravenous Every 24 hours 11/02/12 2218        Assessment/Plan: Impression: Cholecystitis with cholelithiasis, resolving. Plan: I have told the patient that it is recommended that she undergo laparoscopic cholecystectomy. She is resistant at this point to undergoing surgery. I told her the risks and benefits of surgery versus nonoperative monitoring. She states she will make her decision later on today.  LOS: 2 days    JENKINS,MARK A 11/03/2012

## 2012-11-04 ENCOUNTER — Encounter (HOSPITAL_COMMUNITY): Payer: Self-pay | Admitting: *Deleted

## 2012-11-04 ENCOUNTER — Inpatient Hospital Stay (HOSPITAL_COMMUNITY): Payer: Medicare Other | Admitting: Anesthesiology

## 2012-11-04 ENCOUNTER — Encounter (HOSPITAL_COMMUNITY)
Admission: EM | Disposition: A | Discharge status: Home / Self Care | Payer: Self-pay | Source: Home / Self Care | Attending: Family Medicine

## 2012-11-04 ENCOUNTER — Encounter (HOSPITAL_COMMUNITY): Payer: Self-pay | Admitting: Anesthesiology

## 2012-11-04 DIAGNOSIS — I10 Essential (primary) hypertension: Secondary | ICD-10-CM | POA: Diagnosis not present

## 2012-11-04 DIAGNOSIS — E785 Hyperlipidemia, unspecified: Secondary | ICD-10-CM | POA: Diagnosis not present

## 2012-11-04 DIAGNOSIS — E119 Type 2 diabetes mellitus without complications: Secondary | ICD-10-CM | POA: Diagnosis not present

## 2012-11-04 DIAGNOSIS — R1011 Right upper quadrant pain: Secondary | ICD-10-CM | POA: Diagnosis not present

## 2012-11-04 DIAGNOSIS — K8010 Calculus of gallbladder with chronic cholecystitis without obstruction: Secondary | ICD-10-CM | POA: Diagnosis not present

## 2012-11-04 HISTORY — PX: CHOLECYSTECTOMY: SHX55

## 2012-11-04 LAB — URINE CULTURE: Colony Count: 3000

## 2012-11-04 LAB — GLUCOSE, CAPILLARY
Glucose-Capillary: 124 mg/dL — ABNORMAL HIGH (ref 70–99)
Glucose-Capillary: 109 mg/dL — ABNORMAL HIGH (ref 70–99)
Glucose-Capillary: 112 mg/dL — ABNORMAL HIGH (ref 70–99)
Glucose-Capillary: 160 mg/dL — ABNORMAL HIGH (ref 70–99)
Glucose-Capillary: 178 mg/dL — ABNORMAL HIGH (ref 70–99)
Glucose-Capillary: 213 mg/dL — ABNORMAL HIGH (ref 70–99)

## 2012-11-04 LAB — SURGICAL PCR SCREEN
MRSA, PCR: NEGATIVE
Staphylococcus aureus: NEGATIVE

## 2012-11-04 SURGERY — LAPAROSCOPIC CHOLECYSTECTOMY
Anesthesia: General | Site: Abdomen | Wound class: Contaminated

## 2012-11-04 MED ORDER — ONDANSETRON HCL 4 MG/2ML IJ SOLN
4.0000 mg | Freq: Once | INTRAMUSCULAR | Status: AC
  Administered 2012-11-04: 4 mg via INTRAVENOUS

## 2012-11-04 MED ORDER — MIDAZOLAM HCL 2 MG/2ML IJ SOLN
1.0000 mg | INTRAMUSCULAR | Status: DC | PRN
  Administered 2012-11-04: 2 mg via INTRAVENOUS

## 2012-11-04 MED ORDER — GLYCOPYRROLATE 0.2 MG/ML IJ SOLN
INTRAMUSCULAR | Status: DC | PRN
  Administered 2012-11-04: 0.4 mg via INTRAVENOUS

## 2012-11-04 MED ORDER — LIDOCAINE HCL (PF) 1 % IJ SOLN
INTRAMUSCULAR | Status: AC
  Filled 2012-11-04: qty 5

## 2012-11-04 MED ORDER — FENTANYL CITRATE 0.05 MG/ML IJ SOLN
INTRAMUSCULAR | Status: DC | PRN
  Administered 2012-11-04: 50 ug via INTRAVENOUS
  Administered 2012-11-04: 100 ug via INTRAVENOUS
  Administered 2012-11-04: 50 ug via INTRAVENOUS
  Administered 2012-11-04: 100 ug via INTRAVENOUS
  Administered 2012-11-04: 50 ug via INTRAVENOUS

## 2012-11-04 MED ORDER — PHENYLEPHRINE HCL 10 MG/ML IJ SOLN
INTRAMUSCULAR | Status: DC | PRN
  Administered 2012-11-04: 100 ug via INTRAVENOUS

## 2012-11-04 MED ORDER — MORPHINE SULFATE 2 MG/ML IJ SOLN
2.0000 mg | INTRAMUSCULAR | Status: DC | PRN
  Administered 2012-11-04: 2 mg via INTRAVENOUS
  Filled 2012-11-04 (×2): qty 1

## 2012-11-04 MED ORDER — ONDANSETRON HCL 4 MG/2ML IJ SOLN
INTRAMUSCULAR | Status: AC
  Filled 2012-11-04: qty 2

## 2012-11-04 MED ORDER — NEOSTIGMINE METHYLSULFATE 1 MG/ML IJ SOLN
INTRAMUSCULAR | Status: DC | PRN
  Administered 2012-11-04: 3 mg via INTRAVENOUS

## 2012-11-04 MED ORDER — ONDANSETRON HCL 4 MG PO TABS: 4.0000 mg | ORAL_TABLET | Freq: Four times a day (QID) | ORAL | Status: DC | PRN

## 2012-11-04 MED ORDER — FENTANYL CITRATE 0.05 MG/ML IJ SOLN
25.0000 ug | INTRAMUSCULAR | Status: DC | PRN
  Administered 2012-11-04: 100 ug via INTRAVENOUS
  Administered 2012-11-04: 50 ug via INTRAVENOUS

## 2012-11-04 MED ORDER — HYDROCODONE-ACETAMINOPHEN 5-325 MG PO TABS
1.0000 | ORAL_TABLET | ORAL | Status: DC | PRN
Start: 2012-11-04 — End: 2012-11-05
  Administered 2012-11-04: 1 via ORAL
  Filled 2012-11-04: qty 1

## 2012-11-04 MED ORDER — FENTANYL CITRATE 0.05 MG/ML IJ SOLN
INTRAMUSCULAR | Status: AC
Start: 2012-11-04 — End: 2012-11-04
  Filled 2012-11-04: qty 2

## 2012-11-04 MED ORDER — SODIUM CHLORIDE 0.9 % IR SOLN
Status: DC | PRN
  Administered 2012-11-04: 1000 mL

## 2012-11-04 MED ORDER — ROCURONIUM BROMIDE 100 MG/10ML IV SOLN
INTRAVENOUS | Status: DC | PRN
  Administered 2012-11-04: 30 mg via INTRAVENOUS

## 2012-11-04 MED ORDER — FENTANYL CITRATE 0.05 MG/ML IJ SOLN
INTRAMUSCULAR | Status: AC
  Filled 2012-11-04: qty 2

## 2012-11-04 MED ORDER — BUPIVACAINE HCL (PF) 0.5 % IJ SOLN
INTRAMUSCULAR | Status: DC | PRN
  Administered 2012-11-04: 10 mL

## 2012-11-04 MED ORDER — ONDANSETRON HCL 4 MG/2ML IJ SOLN
4.0000 mg | Freq: Four times a day (QID) | INTRAMUSCULAR | Status: DC | PRN
  Administered 2012-11-04: 4 mg via INTRAVENOUS
  Filled 2012-11-04: qty 2

## 2012-11-04 MED ORDER — LACTATED RINGERS IV SOLN
INTRAVENOUS | Status: DC
  Administered 2012-11-04: 1000 mL via INTRAVENOUS

## 2012-11-04 MED ORDER — PHENYLEPHRINE HCL 10 MG/ML IJ SOLN
INTRAMUSCULAR | Status: AC
  Filled 2012-11-04: qty 1

## 2012-11-04 MED ORDER — MIDAZOLAM HCL 2 MG/2ML IJ SOLN
INTRAMUSCULAR | Status: AC
  Filled 2012-11-04: qty 2

## 2012-11-04 MED ORDER — ETOMIDATE 2 MG/ML IV SOLN
INTRAVENOUS | Status: DC | PRN
  Administered 2012-11-04: 12 mg via INTRAVENOUS

## 2012-11-04 MED ORDER — ARTIFICIAL TEARS OP OINT
TOPICAL_OINTMENT | OPHTHALMIC | Status: AC
  Filled 2012-11-04: qty 3.5

## 2012-11-04 MED ORDER — BUPIVACAINE HCL (PF) 0.5 % IJ SOLN
INTRAMUSCULAR | Status: AC
  Filled 2012-11-04: qty 30

## 2012-11-04 MED ORDER — LACTATED RINGERS IV SOLN
INTRAVENOUS | Status: DC
  Administered 2012-11-04: 15:00:00 via INTRAVENOUS

## 2012-11-04 MED ORDER — PROPOFOL 10 MG/ML IV EMUL
INTRAVENOUS | Status: AC
Start: 2012-11-04 — End: 2012-11-04
  Filled 2012-11-04: qty 20

## 2012-11-04 MED ORDER — CHLORHEXIDINE GLUCONATE 4 % EX LIQD: 1.0000 "application " | Freq: Once | CUTANEOUS | Status: DC

## 2012-11-04 MED ORDER — LACTATED RINGERS IV SOLN
INTRAVENOUS | Status: DC | PRN
  Administered 2012-11-04: 13:00:00 via INTRAVENOUS

## 2012-11-04 MED ORDER — ONDANSETRON HCL 4 MG/2ML IJ SOLN: 4.0000 mg | Freq: Once | INTRAMUSCULAR | Status: DC | PRN

## 2012-11-04 MED ORDER — HEMOSTATIC AGENTS (NO CHARGE) OPTIME
TOPICAL | Status: DC | PRN
  Administered 2012-11-04 (×2): 1 via TOPICAL

## 2012-11-04 MED ORDER — FENTANYL CITRATE 0.05 MG/ML IJ SOLN
INTRAMUSCULAR | Status: AC
  Filled 2012-11-04: qty 5

## 2012-11-04 MED ORDER — ETOMIDATE 2 MG/ML IV SOLN
INTRAVENOUS | Status: AC
  Filled 2012-11-04: qty 10

## 2012-11-04 MED ORDER — ROCURONIUM BROMIDE 50 MG/5ML IV SOLN
INTRAVENOUS | Status: AC
  Filled 2012-11-04: qty 1

## 2012-11-04 MED ORDER — LIDOCAINE HCL (CARDIAC) 10 MG/ML IV SOLN
INTRAVENOUS | Status: DC | PRN
  Administered 2012-11-04: 50 mg via INTRAVENOUS

## 2012-11-04 SURGICAL SUPPLY — 33 items
APPLIER CLIP LAPSCP 10X32 DD (CLIP) ×2 IMPLANT
BAG HAMPER (MISCELLANEOUS) ×2 IMPLANT
BAG SPEC RTRVL LRG 6X4 10 (ENDOMECHANICALS) ×1
CLOTH BEACON ORANGE TIMEOUT ST (SAFETY) ×2 IMPLANT
COVER LIGHT HANDLE STERIS (MISCELLANEOUS) ×4 IMPLANT
DECANTER SPIKE VIAL GLASS SM (MISCELLANEOUS) ×2 IMPLANT
DURAPREP 26ML APPLICATOR (WOUND CARE) ×2 IMPLANT
ELECT REM PT RETURN 9FT ADLT (ELECTROSURGICAL) ×2
ELECTRODE REM PT RTRN 9FT ADLT (ELECTROSURGICAL) ×1 IMPLANT
FILTER SMOKE EVAC LAPAROSHD (FILTER) ×2 IMPLANT
FORMALIN 10 PREFIL 120ML (MISCELLANEOUS) ×2 IMPLANT
GLOVE BIO SURGEON STRL SZ7.5 (GLOVE) ×2 IMPLANT
GLOVE INDICATOR 7.0 STRL GRN (GLOVE) ×2 IMPLANT
GLOVE SS BIOGEL STRL SZ 6.5 (GLOVE) IMPLANT
GLOVE SUPERSENSE BIOGEL SZ 6.5 (GLOVE) ×3
GOWN STRL REIN XL XLG (GOWN DISPOSABLE) ×6 IMPLANT
HEMOSTAT SNOW SURGICEL 2X4 (HEMOSTASIS) ×2 IMPLANT
HEMOSTAT SURGICEL 4X8 (HEMOSTASIS) ×1 IMPLANT
INST SET LAPROSCOPIC AP (KITS) ×2 IMPLANT
KIT ROOM TURNOVER APOR (KITS) ×2 IMPLANT
KIT TROCAR LAP CHOLE (TROCAR) ×2 IMPLANT
MANIFOLD NEPTUNE II (INSTRUMENTS) ×2 IMPLANT
NS IRRIG 1000ML POUR BTL (IV SOLUTION) ×2 IMPLANT
PACK LAP CHOLE LZT030E (CUSTOM PROCEDURE TRAY) ×2 IMPLANT
PAD ARMBOARD 7.5X6 YLW CONV (MISCELLANEOUS) ×2 IMPLANT
POUCH SPECIMEN RETRIEVAL 10MM (ENDOMECHANICALS) ×2 IMPLANT
SET BASIN LINEN APH (SET/KITS/TRAYS/PACK) ×2 IMPLANT
SPONGE GAUZE 2X2 8PLY STRL LF (GAUZE/BANDAGES/DRESSINGS) ×8 IMPLANT
STAPLER VISISTAT (STAPLE) ×2 IMPLANT
SUT VICRYL 0 UR6 27IN ABS (SUTURE) ×2 IMPLANT
TAPE CLOTH SURG 4X10 WHT LF (GAUZE/BANDAGES/DRESSINGS) ×1 IMPLANT
WARMER LAPAROSCOPE (MISCELLANEOUS) ×2 IMPLANT
YANKAUER SUCT 12FT TUBE ARGYLE (SUCTIONS) ×2 IMPLANT

## 2012-11-04 NOTE — Progress Notes (Signed)
Laura Davenport, Laura Davenport                ACCOUNT NO.:  [id number]  MEDICAL RECORD NO.:  [id number]  LOCATION:                                 FACILITY:  PHYSICIAN:  Paula Compton. Willey Blade, MD       DATE OF BIRTH:  Jun 21, 1931  DATE OF PROCEDURE:  11/04/2012 DATE OF DISCHARGE:                                PROGRESS NOTE   Laura Davenport denies any pain or vomiting overnight.  She did have a fever to 100.5.  She is afebrile this morning with a temperature of 98.6.  She had discussed the option of surgery with her family and now has decided to proceed with a cholecystectomy.  PHYSICAL EXAMINATION:  VITAL SIGNS:  Temperature 98.6, pulse 94, respirations 18, blood pressure 118/65, oxygen saturation 97%. GENERAL:  Comfortable appearing. LUNGS:  Clear. HEART:  Regular with no murmurs. ABDOMEN:  Soft and nontender with no palpable organomegaly.  IMPRESSION AND PLAN: 1. Cholecystitis.  The case has been discussed further with Dr.     Arnoldo Morale.  He will proceed with cholecystectomy today. 2. Diabetes.  Glucoses yesterday ranged from 121 to 141.  This     morning's Accu-Chek is pending.  Lantus is being held. 3. Hepatitis viral markers are all negative including a hepatitis A,     IgM, hepatitis B surface antigen, and core IgM and hepatitis C     antibody.     Paula Compton. Willey Blade, MD     ROF/MEDQ  D:  11/04/2012  T:  11/04/2012  Job:  [id number]

## 2012-11-04 NOTE — Preoperative (Signed)
Beta Blockers   Reason not to administer Beta Blockers:Not Applicable 

## 2012-11-04 NOTE — Anesthesia Postprocedure Evaluation (Signed)
  Anesthesia Post-op Note  Patient: Laura Davenport  Procedure(s) Performed: Procedure(s): LAPAROSCOPIC CHOLECYSTECTOMY (N/A)  Patient Location: PACU  Anesthesia Type:General  Level of Consciousness: awake, alert , oriented and patient cooperative  Airway and Oxygen Therapy: Patient Spontanous Breathing  Post-op Pain: 4 /10, moderate  Post-op Assessment: Post-op Vital signs reviewed, Patient's Cardiovascular Status Stable, Respiratory Function Stable, Patent Airway, No signs of Nausea or vomiting and Pain level controlled  Post-op Vital Signs: Reviewed and stable  Complications: No apparent anesthesia complications

## 2012-11-04 NOTE — Anesthesia Procedure Notes (Signed)
Procedure Name: Intubation Date/Time: 11/04/2012 1:13 PM Performed by: Antony Contras, Javaun Dimperio L Pre-anesthesia Checklist: Patient identified, Patient being monitored, Timeout performed, Emergency Drugs available and Suction available Patient Re-evaluated:Patient Re-evaluated prior to inductionOxygen Delivery Method: Circle System Utilized Preoxygenation: Pre-oxygenation with 100% oxygen Intubation Type: IV induction Ventilation: Mask ventilation without difficulty Laryngoscope Size: 3 and Miller Grade View: Grade I Tube type: Oral Tube size: 7.0 mm Number of attempts: 1 Airway Equipment and Method: stylet Placement Confirmation: ETT inserted through vocal cords under direct vision,  positive ETCO2 and breath sounds checked- equal and bilateral Secured at: 21 cm Tube secured with: Tape Dental Injury: Teeth and Oropharynx as per pre-operative assessment

## 2012-11-04 NOTE — Op Note (Signed)
Patient:  Laura Davenport  DOB:  May 06, 1931  MRN:  [id number]   Preop Diagnosis:  Cholecystitis, cholelithiasis  Postop Diagnosis:  Same  Procedure:  Laparoscopic cholecystectomy  Surgeon:  Aviva Signs, M.D.  Anes:  General endotracheal  Indications:  Patient is a 79 year old black female presents with biliary colic secondary to cholelithiasis. The risks and benefits of the procedure including bleeding, infection, hepatobiliary injury, the possibility of an open procedure were fully explained to the patient, who gave informed consent.  Procedure note:  The patient was placed in the supine position. After induction of general endotracheal anesthesia, the abdomen was prepped and draped using usual sterile technique with DuraPrep. Surgical site confirmation was performed.  The suprabuccal incision was made down to the fascia. A Veress needle was introduced into the abdominal cavity and confirmation of placement was done using the saline drop test. The abdomen was then insufflated to 16 mm mercury pressure. An 11 mm trocar was introduced into the abdominal cavity under direct visualization without difficulty. The patient was placed in reverse Trendelenburg position and additional 11 mm trocar was placed the epigastric region and 5 mm trochars were placed the right upper quadrant and right flank regions. The liver was inspected and noted within normal limits. The gallbladder was retracted in a dynamic fashion in order to expose the triangle of calot. Some bile did leak out of the lumen of the gallbladder during the dissection. The cystic duct was first identified. Its junction were to the infundibulum was fully identified. Endoclips were placed proximally and distally on the cystic duct, and the cystic duct was divided. This is likewise done the cystic artery. The gallbladder was then freed away from the gallbladder fossa using Bovie electrocautery. The gallbladder was delivered through the epigastric  trocar site using an Endo Catch bag. The gallbladder fossa was inspected no abnormal bleeding or bile leakage was noted. Surgicel is placed the gallbladder fossa. All fluid and air were then evacuated from the abdominal cavity prior to removal of the trochars.  All wounds were irrigated with normal saline. All wounds were injected with 0.5% Sensorcaine. The supraumbilical fascia was reapproximated using 0 Vicryl interrupted suture. All skin incisions were closed using staples. Betadine ointment and dry sterile dressings were applied.  All tape and needle counts were correct at the end of the procedure. The patient was extubated in the operating room and transferred to PACU in stable condition.  Complications:  None  EBL:  Minimal  Specimen:  Gallbladder

## 2012-11-04 NOTE — Progress Notes (Signed)
Subjective: Pt denies abdominal pain.  Denies N/V.  She wants to have elective cholecystectomy.  Objective: Vital signs in last 24 hours: Temp:  [98.6 F (37 C)-100.5 F (38.1 C)] 98.6 F (37 C) (03/05 [id number]) Pulse Rate:  [88-95] 94 (03/05 0613) Resp:  [18-19] 18 (03/05 0613) BP: (105-118)/(64-70) 118/65 mmHg (03/05 0613) SpO2:  [97 %] 97 % (03/05 0613) Last BM Date: 11/02/12 No LMP recorded. Patient is postmenopausal. Body mass index is 31.41 kg/(m^2). General:   Alert, pleasant and cooperative in NAD.  Multiple family members at bedside. Eyes:  Sclera clear, no icterus.   Conjunctiva pink. Mouth:  oropharynx pink & moist. Heart:  Regular rate and rhythm. Abdomen:   Normal bowel sounds.  Soft, nontender and nondistended. No guarding or rebound tenderness.   Msk:  Symmetrical without gross deformities.  Pulses:  Normal pulses noted. Extremities:  Without edema. Neurologic:  Alert and  oriented x4;  grossly normal neurologically. Skin:  Intact without significant lesions or rashes. Cervical Nodes:  No significant cervical adenopathy. Psych:  Alert and cooperative. Normal mood and affect.  Intake/Output from previous day: 03/04 0701 - 03/05 0700 In: 480 [P.O.:480] Out: 7 [Urine:5; Stool:2]  Lab Results:  Recent Labs  11/01/12 0814  WBC 6.6  HGB 12.1  HCT 35.5*  PLT 207   BMET  Recent Labs  11/01/12 0814  NA 134*  K 3.9  CL 98  CO2 22  GLUCOSE 270*  BUN 23  CREATININE 1.02  CALCIUM 9.6   LFT  Recent Labs  11/01/12 0814 11/02/12 0948 11/03/12 0442  PROT 7.8 6.7 6.6  ALBUMIN 3.8 3.1* 3.0*  AST 1246* 343* 148*  ALT 511* 385* 258*  ALKPHOS 138* 121* 110  BILITOT 1.0 1.3* 0.9  BILIDIR  --  0.3 0.2  IBILI  --  1.0* 0.7  LIPASE 26  --   --   Hepatitis Panel  Recent Labs  11/02/12 0948  HEPBSAG NEGATIVE  HCVAB NEGATIVE  HEPAIGM NEGATIVE  HEPBIGM NEGATIVE   Studies/Results: Mr 3d Recon At Scanner  11/02/2012  *RADIOLOGY REPORT*  Clinical Data:   Abdominal pain.  Nausea and vomiting.  Elevated liver function tests.  Cholelithiasis and biliary dilatation seen on recent ultrasound.  MRI ABDOMEN WITHOUT AND WITH CONTRAST (MRCP)  Technique:  Multiplanar multisequence MR imaging of the abdomen was performed without and with contrast, including heavily T2-weighted images of the biliary and pancreatic ducts.  Three-dimensional MR images were rendered by post processing of the original MR data.  Contrast: 71mL MULTIHANCE GADOBENATE DIMEGLUMINE 529 MG/ML IV SOLN  Comparison:  Ultrasound 11/02/2012 and CT on 11/01/2012  Findings:  Multiple tiny gallstones are seen in the dependent portion the gallbladder.  The gallbladder is distended but there is no evidence of significant wall thickening or pericholecystic inflammatory changes.  The common bile duct measures 7 mm in diameter, which is within normal limits for patient age.  There is some image degradation due to respiratory motion artifact, but there is no definite evidence of choledocholithiasis.  No evidence of pancreatic ductal dilatation.  No liver masses are identified.  The pancreas, spleen, and adrenal glands are normal in appearance.  Tiny renal cysts are seen bilaterally, however there is no evidence of renal mass or hydronephrosis.  No other soft tissue masses or lymphadenopathy identified within the abdomen.  No evidence of acute inflammatory process, abnormal fluid collections, or dilated bowel loops.  IMPRESSION:  1.  Multiple tiny gallstones with distended gallbladder.  No evidence  of gallbladder wall thickening. 2.  No definite evidence of choledocholithiasis or biliary dilatation.   Original Report Authenticated By: Earle Gell, M.D.    Mr Abd W/wo Cm/mrcp  11/02/2012  *RADIOLOGY REPORT*  Clinical Data:  Abdominal pain.  Nausea and vomiting.  Elevated liver function tests.  Cholelithiasis and biliary dilatation seen on recent ultrasound.  MRI ABDOMEN WITHOUT AND WITH CONTRAST (MRCP)  Technique:   Multiplanar multisequence MR imaging of the abdomen was performed without and with contrast, including heavily T2-weighted images of the biliary and pancreatic ducts.  Three-dimensional MR images were rendered by post processing of the original MR data.  Contrast: 63mL MULTIHANCE GADOBENATE DIMEGLUMINE 529 MG/ML IV SOLN  Comparison:  Ultrasound 11/02/2012 and CT on 11/01/2012  Findings:  Multiple tiny gallstones are seen in the dependent portion the gallbladder.  The gallbladder is distended but there is no evidence of significant wall thickening or pericholecystic inflammatory changes.  The common bile duct measures 7 mm in diameter, which is within normal limits for patient age.  There is some image degradation due to respiratory motion artifact, but there is no definite evidence of choledocholithiasis.  No evidence of pancreatic ductal dilatation.  No liver masses are identified.  The pancreas, spleen, and adrenal glands are normal in appearance.  Tiny renal cysts are seen bilaterally, however there is no evidence of renal mass or hydronephrosis.  No other soft tissue masses or lymphadenopathy identified within the abdomen.  No evidence of acute inflammatory process, abnormal fluid collections, or dilated bowel loops.  IMPRESSION:  1.  Multiple tiny gallstones with distended gallbladder.  No evidence of gallbladder wall thickening. 2.  No definite evidence of choledocholithiasis or biliary dilatation.   Original Report Authenticated By: Earle Gell, M.D.    US Abdomen Limited Ruq  11/02/2012  *RADIOLOGY REPORT*  Clinical Data:  Cholelithiasis.  LIMITED ABDOMINAL ULTRASOUND - RIGHT UPPER QUADRANT  Comparison:  None.  Findings:  Gallbladder:  Small layering stones are present in the gallbladder. Wall thickness is within normal limits at 2.6 mm.  There is no sonographic Murphy's sign.  Common bile duct:  The common bile duct is at the upper limits of normal for age, 7.8 mm.  No obstructing stone or lesion is  evident.  Liver:  Slight increased echogenicity is present.  No focal lesions are present.  There is no free fluid.  IMPRESSION:  1.  Cholelithiasis without evidence for cholecystitis. 2.  Mild fatty infiltration of the liver.                    Original Report Authenticated By: San Morelle, M.D.     Assessment: 1. Cholelithiasis/Cholecystitis w/ passage of CBD stone likely:  Much improved.  For cholecystectomy.  2. Transaminitis:  Improving.  Follow to baseline. 3. Fatty liver on ultrasound:  May have underlying transaminitis    Plan: 1. Continue supportive measures 2. Cholecystectomy per surgery  LOS: 3 days   Vickey Huger  11/04/2012, 7:46 AM

## 2012-11-04 NOTE — Transfer of Care (Signed)
Immediate Anesthesia Transfer of Care Note  Patient: Laura Davenport  Procedure(s) Performed: Procedure(s): LAPAROSCOPIC CHOLECYSTECTOMY (N/A)  Patient Location: PACU  Anesthesia Type:General  Level of Consciousness: confused  Airway & Oxygen Therapy: Patient Spontanous Breathing and Patient connected to face mask oxygen  Post-op Assessment: Report given to PACU RN, Post -op Vital signs reviewed and stable and Patient moving all extremities  Post vital signs: Reviewed and stable  Complications: No apparent anesthesia complications

## 2012-11-04 NOTE — Anesthesia Preprocedure Evaluation (Addendum)
Anesthesia Evaluation  Patient identified by MRN, date of birth, ID band Patient awake    Reviewed: Allergy & Precautions, H&P , NPO status , Patient's Chart, lab work & pertinent test results  Airway Mallampati: II      Dental  (+) Edentulous Upper and Edentulous Lower   Pulmonary neg pulmonary ROS,  breath sounds clear to auscultation        Cardiovascular hypertension, Pt. on medications Rhythm:Regular Rate:Tachycardia     Neuro/Psych    GI/Hepatic   Endo/Other  diabetes, Well Controlled, Type 2, Oral Hypoglycemic Agents  Renal/GU      Musculoskeletal   Abdominal   Peds  Hematology   Anesthesia Other Findings   Reproductive/Obstetrics                          Anesthesia Physical Anesthesia Plan  ASA: III  Anesthesia Plan: General   Post-op Pain Management:    Induction: Intravenous  Airway Management Planned: Oral ETT  Additional Equipment:   Intra-op Plan:   Post-operative Plan: Extubation in OR  Informed Consent: I have reviewed the patients History and Physical, chart, labs and discussed the procedure including the risks, benefits and alternatives for the proposed anesthesia with the patient or authorized representative who has indicated his/her understanding and acceptance.     Plan Discussed with:   Anesthesia Plan Comments: (Amidate induction )        Anesthesia Quick Evaluation

## 2012-11-05 LAB — BASIC METABOLIC PANEL
BUN: 14 mg/dL (ref 6–23)
CO2: 25 mEq/L (ref 19–32)
Calcium: 8.8 mg/dL (ref 8.4–10.5)
Chloride: 101 mEq/L (ref 96–112)
Creatinine, Ser: 1.02 mg/dL (ref 0.50–1.10)
GFR calc Af Amer: 59 mL/min — ABNORMAL LOW (ref >90)
GFR calc non Af Amer: 51 mL/min — ABNORMAL LOW (ref >90)
Glucose, Bld: 180 mg/dL — ABNORMAL HIGH (ref 70–99)
Potassium: 4 mEq/L (ref 3.5–5.1)
Sodium: 136 mEq/L (ref 135–145)

## 2012-11-05 LAB — HEPATIC FUNCTION PANEL
ALT: 139 U/L — ABNORMAL HIGH (ref 0–35)
AST: 72 U/L — ABNORMAL HIGH (ref 0–37)
Albumin: 2.8 g/dL — ABNORMAL LOW (ref 3.5–5.2)
Alkaline Phosphatase: 99 U/L (ref 39–117)
Bilirubin, Direct: 0.1 mg/dL (ref 0.0–0.3)
Indirect Bilirubin: 0.4 mg/dL (ref 0.3–0.9)
Total Bilirubin: 0.5 mg/dL (ref 0.3–1.2)
Total Protein: 6.6 g/dL (ref 6.0–8.3)

## 2012-11-05 LAB — GLUCOSE, CAPILLARY: Glucose-Capillary: 147 mg/dL — ABNORMAL HIGH (ref 70–99)

## 2012-11-05 LAB — CBC
HCT: 30.2 % — ABNORMAL LOW (ref 36.0–46.0)
Hemoglobin: 10.6 g/dL — ABNORMAL LOW (ref 12.0–15.0)
MCH: 31.4 pg (ref 26.0–34.0)
MCHC: 35.1 g/dL (ref 30.0–36.0)
MCV: 89.3 fL (ref 78.0–100.0)
Platelets: 193 10*3/uL (ref 150–400)
RBC: 3.38 MIL/uL — ABNORMAL LOW (ref 3.87–5.11)
RDW: 13.1 % (ref 11.5–15.5)
WBC: 9.8 10*3/uL (ref 4.0–10.5)

## 2012-11-05 NOTE — Progress Notes (Signed)
Subjective: Doing well s/p cholecystectomy yesterday. Tolerating clears. No nausea or vomiting, expected post-op soreness.   Objective: Vital signs in last 24 hours: Temp:  [98 F (36.7 C)-100.6 F (38.1 C)] 98.1 F (36.7 C) (03/06 0556) Pulse Rate:  [97-130] 99 (03/06 0556) Resp:  [14-28] 20 (03/06 0556) BP: (61-170)/(36-147) 105/51 mmHg (03/06 0556) SpO2:  [93 %-100 %] 100 % (03/06 0556) FiO2 (%):  [2 %] 2 % (03/05 1752) Last BM Date: 11/03/12 General:   Alert and oriented, pleasant Head:  Normocephalic and atraumatic. Eyes:  No icterus, sclera clear. Conjuctiva pink.  Mouth:  Without lesions, mucosa pink and moist.  Heart:  S1, S2 present, no murmurs noted.  Lungs: Clear to auscultation bilaterally, without wheezing, rales, or rhonchi.  Abdomen:  Bowel sounds present, soft, non-distended. Lap sites with gauze; umbilical site with evidence of prior oozing.  Msk:  Symmetrical without gross deformities. Normal posture.   Intake/Output from previous day: 03/05 0701 - 03/06 0700 In: 1037.5 [I.V.:1037.5] Out: -  Intake/Output this shift:    Lab Results:  Recent Labs  11/05/12 0440  WBC 9.8  HGB 10.6*  HCT 30.2*  PLT 193   BMET  Recent Labs  11/05/12 0440  NA 136  K 4.0  CL 101  CO2 25  GLUCOSE 180*  BUN 14  CREATININE 1.02  CALCIUM 8.8   LFT  Recent Labs  11/02/12 0948 11/03/12 0442 11/05/12 0440  PROT 6.7 6.6 6.6  ALBUMIN 3.1* 3.0* 2.8*  AST 343* 148* 72*  ALT 385* 258* 139*  ALKPHOS 121* 110 99  BILITOT 1.3* 0.9 0.5  BILIDIR 0.3 0.2 0.1  IBILI 1.0* 0.7 0.4   Hepatitis Panel  Recent Labs  11/02/12 0948  HEPBSAG NEGATIVE  HCVAB NEGATIVE  HEPAIGM NEGATIVE  HEPBIGM NEGATIVE   Assessment: 79 year old female admitted with transaminitis, abdominal pain, MRCP without evidence of choledocholithiasis and CBD 84mm. Fatty liver on [place]. She underwent cholecystectomy by Dr. Arnoldo Morale on 3/5, post-op day #1 today. LFTs continue to improve. Progressing  well, appropriate for d/c in near future.   Plan: Signing off. Hopeful d/c soon.   LOS: 4 days   Laban Emperor  11/05/2012, 7:54 AM

## 2012-11-05 NOTE — Progress Notes (Signed)
Patient discharged home with family.  No new meds given to patient or changes to previous ones made.  Follow up appointment in place.  Incisional care education given to patient.  Verbalizes understanding of discharge instructions. IV removed - appears that is had infiltrated.  Slight swelling noted above site, but patient states no pain or tenderness.  Instructed to apply heat at home.

## 2012-11-05 NOTE — Anesthesia Postprocedure Evaluation (Signed)
  Anesthesia Post-op Note  Patient: Laura Davenport  Procedure(s) Performed: Procedure(s): LAPAROSCOPIC CHOLECYSTECTOMY (N/A)  Patient Location: Room 302  Anesthesia Type:General  Level of Consciousness: awake, alert , oriented and patient cooperative  Airway and Oxygen Therapy: Patient Spontanous Breathing  Post-op Pain: mild  Post-op Assessment: Post-op Vital signs reviewed, Patient's Cardiovascular Status Stable, Respiratory Function Stable, Patent Airway, No signs of Nausea or vomiting, Adequate PO intake and Pain level controlled  Post-op Vital Signs: Reviewed and stable  Complications: No apparent anesthesia complications

## 2012-11-05 NOTE — Discharge Summary (Signed)
Physician Discharge Summary  Patient ID: Laura Davenport MRN: [id number] DOB/AGE: April 02, 1931 79 y.o.  Admit date: 11/01/2012 Discharge date: 11/05/2012  Admission Diagnoses: Abdominal pain, nausea, vomiting, elevated liver tests  Discharge Diagnoses: Cholecystitis, cholelithiasis Active Problems:   * No active hospital problems. *   Discharged Condition: good  Hospital Course: Patient is a 79 year old black female who was admitted for upper abdominal pain, nausea, vomiting. She was noted to have elevated liver enzyme tests, though she was not jaundiced. Ultrasound of the gallbladder revealed cholelithiasis with slightly dilated common bile duct. GI was consulted and an MRCP was performed which revealed no choledocholithiasis. She subsequently underwent laparoscopic cholecystectomy on 11/04/2012. She tolerated the procedure well. Her postoperative course has been unremarkable. Her diet was advanced without difficulty. She is being discharged home in good improving condition.  Consults: GI and general surgery  Treatments: surgery: Laparoscopic cholecystectomy on 11/04/2012  Discharge Exam: Blood pressure 105/51, pulse 99, temperature 98.1 F (36.7 C), temperature source Oral, resp. rate 20, height 5\' 7"  (1.702 m), weight 91 kg (200 lb 9.9 oz), SpO2 100.00%. General appearance: alert, cooperative, appears stated age and no distress Eyes: No scleral icterus Resp: clear to auscultation bilaterally Cardio: regular rate and rhythm, S1, S2 normal, no murmur, click, rub or gallop GI: soft, non-tender; bowel sounds normal; no masses,  no organomegaly and Incisions healing well.  Disposition: 01-Home or Self Care     Medication List    TAKE these medications       acetaminophen 500 MG tablet  Commonly known as:  TYLENOL  Take 500 mg by mouth as needed. For headache pain     aspirin EC 81 MG tablet  Take 81 mg by mouth every morning.     atorvastatin 40 MG tablet  Commonly known as:   LIPITOR  Take 40 mg by mouth daily.     AZOR 5-40 MG per tablet  Generic drug:  amLODipine-olmesartan  Take 1 tablet by mouth daily.     hydrochlorothiazide 25 MG tablet  Commonly known as:  HYDRODIURIL  Take 25 mg by mouth daily.           Follow-up Information   Follow up with Jamesetta So, MD. Schedule an appointment as soon as possible for a visit on 11/12/2012.   Contact information:   [id number] [id number] [phone]       Signed: Aviva Signs A 11/05/2012, 8:40 AM

## 2012-11-06 NOTE — Progress Notes (Signed)
Laura Davenport, Laura Davenport                ACCOUNT NO.:  [id number]  MEDICAL RECORD NO.:  [id number]  LOCATION:                                 FACILITY:  PHYSICIAN:  Paula Compton. Willey Blade, MD       DATE OF BIRTH:  1931-06-30  DATE OF PROCEDURE:  11/05/2012 DATE OF DISCHARGE:  11/05/2012                                PROGRESS NOTE   Laura Davenport underwent a laparoscopic cholecystectomy yesterday.  She is comfortable this morning.  She is not having significant pain.  She did have medication for nausea earlier this morning.  PHYSICAL EXAMINATION:  VITAL SIGNS:  She has had a maximum temperature in the past 24 hours of 100.6.  Her temperature now is 98.1 with a pulse of 99, respirations 20, and blood pressure 105/51. LUNGS:  Clear. HEART:  Regular with no murmurs. ABDOMEN:  Soft and nondistended.  She has had some minor blood oozing at the trocar site at her umbilicus.  Labs revealed an improvement in her AST to 72 and ALT to 139.  White count is 9.8 with a hemoglobin of 10.6, and platelets of 193,000.  IMPRESSION AND PLAN: 1. Cholecystitis status post cholecystectomy, postop day 1.  She is     comfortable and appears to be doing well.  Her liver enzymes are     improving. 2. Diabetes.  Accu-Chek this morning was 147.  Glucose on her MET7 was     180 earlier this morning.  Continue sliding scale. 3. Urine culture is negative.     Paula Compton. Willey Blade, MD     ROF/MEDQ  D:  11/05/2012  T:  11/05/2012  Job:  [id number]

## 2012-11-09 ENCOUNTER — Encounter (HOSPITAL_COMMUNITY): Payer: Self-pay | Admitting: General Surgery

## 2013-06-29 ENCOUNTER — Other Ambulatory Visit (HOSPITAL_COMMUNITY): Payer: Self-pay | Admitting: Internal Medicine

## 2013-06-29 DIAGNOSIS — Z139 Encounter for screening, unspecified: Secondary | ICD-10-CM

## 2013-07-08 ENCOUNTER — Ambulatory Visit (HOSPITAL_COMMUNITY)
Admission: RE | Admit: 2013-07-08 | Discharge: 2013-07-08 | Disposition: A | Discharge status: Home / Self Care | Payer: Medicare Other | Source: Ambulatory Visit | Attending: Internal Medicine | Admitting: Internal Medicine

## 2013-07-08 DIAGNOSIS — Z139 Encounter for screening, unspecified: Secondary | ICD-10-CM

## 2013-07-08 DIAGNOSIS — Z1231 Encounter for screening mammogram for malignant neoplasm of breast: Secondary | ICD-10-CM | POA: Insufficient documentation

## 2014-06-13 ENCOUNTER — Other Ambulatory Visit (HOSPITAL_COMMUNITY): Payer: Self-pay | Admitting: Internal Medicine

## 2014-06-13 DIAGNOSIS — Z1231 Encounter for screening mammogram for malignant neoplasm of breast: Secondary | ICD-10-CM

## 2014-07-11 ENCOUNTER — Ambulatory Visit (HOSPITAL_COMMUNITY)
Admission: RE | Admit: 2014-07-11 | Discharge: 2014-07-11 | Disposition: A | Discharge status: Home / Self Care | Payer: Medicare Other | Source: Ambulatory Visit | Attending: Internal Medicine | Admitting: Internal Medicine

## 2014-07-11 ENCOUNTER — Ambulatory Visit (HOSPITAL_COMMUNITY): Payer: Medicare Other

## 2014-07-11 DIAGNOSIS — Z1231 Encounter for screening mammogram for malignant neoplasm of breast: Secondary | ICD-10-CM | POA: Insufficient documentation

## 2014-07-19 ENCOUNTER — Other Ambulatory Visit (HOSPITAL_COMMUNITY): Payer: Self-pay | Admitting: Internal Medicine

## 2014-07-19 DIAGNOSIS — Z780 Asymptomatic menopausal state: Secondary | ICD-10-CM

## 2014-07-26 ENCOUNTER — Ambulatory Visit (HOSPITAL_COMMUNITY)
Admission: RE | Admit: 2014-07-26 | Discharge: 2014-07-26 | Disposition: A | Discharge status: Home / Self Care | Payer: Medicare Other | Source: Ambulatory Visit | Attending: Internal Medicine | Admitting: Internal Medicine

## 2014-07-26 DIAGNOSIS — Z794 Long term (current) use of insulin: Secondary | ICD-10-CM | POA: Insufficient documentation

## 2014-07-26 DIAGNOSIS — E119 Type 2 diabetes mellitus without complications: Secondary | ICD-10-CM | POA: Insufficient documentation

## 2014-07-26 DIAGNOSIS — Z13820 Encounter for screening for osteoporosis: Secondary | ICD-10-CM | POA: Diagnosis not present

## 2014-07-26 DIAGNOSIS — Z780 Asymptomatic menopausal state: Secondary | ICD-10-CM | POA: Diagnosis not present

## 2014-07-26 DIAGNOSIS — M818 Other osteoporosis without current pathological fracture: Secondary | ICD-10-CM | POA: Diagnosis not present

## 2014-11-11 DIAGNOSIS — Z79899 Other long term (current) drug therapy: Secondary | ICD-10-CM | POA: Diagnosis not present

## 2014-11-11 DIAGNOSIS — E785 Hyperlipidemia, unspecified: Secondary | ICD-10-CM | POA: Diagnosis not present

## 2014-11-11 DIAGNOSIS — E119 Type 2 diabetes mellitus without complications: Secondary | ICD-10-CM | POA: Diagnosis not present

## 2014-11-11 DIAGNOSIS — I10 Essential (primary) hypertension: Secondary | ICD-10-CM | POA: Diagnosis not present

## 2014-11-15 DIAGNOSIS — Z9181 History of falling: Secondary | ICD-10-CM | POA: Diagnosis not present

## 2014-11-15 DIAGNOSIS — S82001A Unspecified fracture of right patella, initial encounter for closed fracture: Secondary | ICD-10-CM | POA: Diagnosis not present

## 2014-11-15 DIAGNOSIS — M25562 Pain in left knee: Secondary | ICD-10-CM | POA: Diagnosis not present

## 2014-11-15 DIAGNOSIS — M79631 Pain in right forearm: Secondary | ICD-10-CM | POA: Diagnosis not present

## 2014-11-15 DIAGNOSIS — R030 Elevated blood-pressure reading, without diagnosis of hypertension: Secondary | ICD-10-CM | POA: Diagnosis not present

## 2014-11-15 DIAGNOSIS — R000 Tachycardia, unspecified: Secondary | ICD-10-CM | POA: Diagnosis not present

## 2014-11-15 DIAGNOSIS — E860 Dehydration: Secondary | ICD-10-CM | POA: Diagnosis not present

## 2014-11-15 DIAGNOSIS — Y929 Unspecified place or not applicable: Secondary | ICD-10-CM | POA: Diagnosis not present

## 2014-11-15 DIAGNOSIS — I10 Essential (primary) hypertension: Secondary | ICD-10-CM | POA: Diagnosis not present

## 2014-11-15 DIAGNOSIS — Z743 Need for continuous supervision: Secondary | ICD-10-CM | POA: Diagnosis not present

## 2014-11-15 DIAGNOSIS — S42301A Unspecified fracture of shaft of humerus, right arm, initial encounter for closed fracture: Secondary | ICD-10-CM | POA: Diagnosis not present

## 2014-11-15 DIAGNOSIS — E119 Type 2 diabetes mellitus without complications: Secondary | ICD-10-CM | POA: Diagnosis not present

## 2014-11-15 DIAGNOSIS — R0689 Other abnormalities of breathing: Secondary | ICD-10-CM | POA: Diagnosis not present

## 2014-11-15 DIAGNOSIS — S42201A Unspecified fracture of upper end of right humerus, initial encounter for closed fracture: Secondary | ICD-10-CM | POA: Diagnosis not present

## 2014-11-15 DIAGNOSIS — E139 Other specified diabetes mellitus without complications: Secondary | ICD-10-CM | POA: Diagnosis not present

## 2014-11-15 DIAGNOSIS — R262 Difficulty in walking, not elsewhere classified: Secondary | ICD-10-CM | POA: Diagnosis not present

## 2014-11-15 DIAGNOSIS — S82002D Unspecified fracture of left patella, subsequent encounter for closed fracture with routine healing: Secondary | ICD-10-CM | POA: Diagnosis not present

## 2014-11-15 DIAGNOSIS — E162 Hypoglycemia, unspecified: Secondary | ICD-10-CM | POA: Diagnosis not present

## 2014-11-15 DIAGNOSIS — S42294A Other nondisplaced fracture of upper end of right humerus, initial encounter for closed fracture: Secondary | ICD-10-CM | POA: Diagnosis not present

## 2014-11-15 DIAGNOSIS — M25552 Pain in left hip: Secondary | ICD-10-CM | POA: Diagnosis not present

## 2014-11-15 DIAGNOSIS — S4991XA Unspecified injury of right shoulder and upper arm, initial encounter: Secondary | ICD-10-CM | POA: Diagnosis not present

## 2014-11-15 DIAGNOSIS — R278 Other lack of coordination: Secondary | ICD-10-CM | POA: Diagnosis not present

## 2014-11-15 DIAGNOSIS — Z4789 Encounter for other orthopedic aftercare: Secondary | ICD-10-CM | POA: Diagnosis not present

## 2014-11-15 DIAGNOSIS — S82042A Displaced comminuted fracture of left patella, initial encounter for closed fracture: Secondary | ICD-10-CM | POA: Diagnosis not present

## 2014-11-15 DIAGNOSIS — R0602 Shortness of breath: Secondary | ICD-10-CM | POA: Diagnosis not present

## 2014-11-15 DIAGNOSIS — R279 Unspecified lack of coordination: Secondary | ICD-10-CM | POA: Diagnosis not present

## 2014-11-15 DIAGNOSIS — S42201D Unspecified fracture of upper end of right humerus, subsequent encounter for fracture with routine healing: Secondary | ICD-10-CM | POA: Diagnosis not present

## 2014-11-15 DIAGNOSIS — E11649 Type 2 diabetes mellitus with hypoglycemia without coma: Secondary | ICD-10-CM | POA: Diagnosis not present

## 2014-11-15 DIAGNOSIS — D62 Acute posthemorrhagic anemia: Secondary | ICD-10-CM | POA: Diagnosis not present

## 2014-11-15 DIAGNOSIS — S79912A Unspecified injury of left hip, initial encounter: Secondary | ICD-10-CM | POA: Diagnosis not present

## 2014-11-15 DIAGNOSIS — S6290XA Unspecified fracture of unspecified wrist and hand, initial encounter for closed fracture: Secondary | ICD-10-CM | POA: Diagnosis not present

## 2014-11-15 DIAGNOSIS — W109XXA Fall (on) (from) unspecified stairs and steps, initial encounter: Secondary | ICD-10-CM | POA: Diagnosis not present

## 2014-11-15 DIAGNOSIS — F419 Anxiety disorder, unspecified: Secondary | ICD-10-CM | POA: Diagnosis not present

## 2014-11-15 DIAGNOSIS — S8291XA Unspecified fracture of right lower leg, initial encounter for closed fracture: Secondary | ICD-10-CM | POA: Diagnosis not present

## 2014-11-15 DIAGNOSIS — S42291A Other displaced fracture of upper end of right humerus, initial encounter for closed fracture: Secondary | ICD-10-CM | POA: Diagnosis not present

## 2014-11-15 DIAGNOSIS — R488 Other symbolic dysfunctions: Secondary | ICD-10-CM | POA: Diagnosis not present

## 2014-11-15 DIAGNOSIS — I4891 Unspecified atrial fibrillation: Secondary | ICD-10-CM | POA: Diagnosis not present

## 2014-11-15 DIAGNOSIS — M79621 Pain in right upper arm: Secondary | ICD-10-CM | POA: Diagnosis not present

## 2014-11-15 DIAGNOSIS — Z7982 Long term (current) use of aspirin: Secondary | ICD-10-CM | POA: Diagnosis not present

## 2014-11-15 DIAGNOSIS — S79911A Unspecified injury of right hip, initial encounter: Secondary | ICD-10-CM | POA: Diagnosis not present

## 2014-11-15 DIAGNOSIS — M6281 Muscle weakness (generalized): Secondary | ICD-10-CM | POA: Diagnosis not present

## 2014-11-15 DIAGNOSIS — S82042D Displaced comminuted fracture of left patella, subsequent encounter for closed fracture with routine healing: Secondary | ICD-10-CM | POA: Diagnosis not present

## 2014-11-15 DIAGNOSIS — Z79899 Other long term (current) drug therapy: Secondary | ICD-10-CM | POA: Diagnosis not present

## 2014-11-15 DIAGNOSIS — M25551 Pain in right hip: Secondary | ICD-10-CM | POA: Diagnosis not present

## 2014-11-15 DIAGNOSIS — S82002A Unspecified fracture of left patella, initial encounter for closed fracture: Secondary | ICD-10-CM | POA: Diagnosis not present

## 2014-11-15 DIAGNOSIS — S59911A Unspecified injury of right forearm, initial encounter: Secondary | ICD-10-CM | POA: Diagnosis not present

## 2014-11-22 ENCOUNTER — Inpatient Hospital Stay
Admission: RE | Admit: 2014-11-22 | Discharge: 2014-12-22 | Disposition: A | Discharge status: Nursing Home (Includes ICF) | Payer: Medicaid Other | Source: Ambulatory Visit | Attending: Internal Medicine | Admitting: Internal Medicine

## 2014-11-22 DIAGNOSIS — S42201D Unspecified fracture of upper end of right humerus, subsequent encounter for fracture with routine healing: Secondary | ICD-10-CM | POA: Diagnosis not present

## 2014-11-22 DIAGNOSIS — E119 Type 2 diabetes mellitus without complications: Secondary | ICD-10-CM | POA: Diagnosis not present

## 2014-11-22 DIAGNOSIS — I10 Essential (primary) hypertension: Secondary | ICD-10-CM | POA: Diagnosis not present

## 2014-11-22 DIAGNOSIS — R279 Unspecified lack of coordination: Secondary | ICD-10-CM | POA: Diagnosis not present

## 2014-11-22 DIAGNOSIS — M1711 Unilateral primary osteoarthritis, right knee: Secondary | ICD-10-CM | POA: Diagnosis not present

## 2014-11-22 DIAGNOSIS — R509 Fever, unspecified: Secondary | ICD-10-CM | POA: Diagnosis not present

## 2014-11-22 DIAGNOSIS — R488 Other symbolic dysfunctions: Secondary | ICD-10-CM | POA: Diagnosis not present

## 2014-11-22 DIAGNOSIS — M6281 Muscle weakness (generalized): Secondary | ICD-10-CM | POA: Diagnosis not present

## 2014-11-22 DIAGNOSIS — Z9181 History of falling: Secondary | ICD-10-CM | POA: Diagnosis not present

## 2014-11-22 DIAGNOSIS — N390 Urinary tract infection, site not specified: Secondary | ICD-10-CM | POA: Diagnosis not present

## 2014-11-22 DIAGNOSIS — Z743 Need for continuous supervision: Secondary | ICD-10-CM | POA: Diagnosis not present

## 2014-11-22 DIAGNOSIS — R278 Other lack of coordination: Secondary | ICD-10-CM | POA: Diagnosis not present

## 2014-11-22 DIAGNOSIS — S42301A Unspecified fracture of shaft of humerus, right arm, initial encounter for closed fracture: Secondary | ICD-10-CM | POA: Diagnosis not present

## 2014-11-22 DIAGNOSIS — R262 Difficulty in walking, not elsewhere classified: Secondary | ICD-10-CM | POA: Diagnosis not present

## 2014-11-22 DIAGNOSIS — S82002D Unspecified fracture of left patella, subsequent encounter for closed fracture with routine healing: Secondary | ICD-10-CM | POA: Diagnosis not present

## 2014-11-22 DIAGNOSIS — S82042D Displaced comminuted fracture of left patella, subsequent encounter for closed fracture with routine healing: Secondary | ICD-10-CM | POA: Diagnosis not present

## 2014-11-22 DIAGNOSIS — Z4789 Encounter for other orthopedic aftercare: Secondary | ICD-10-CM | POA: Diagnosis not present

## 2014-11-22 DIAGNOSIS — T814XXA Infection following a procedure, initial encounter: Secondary | ICD-10-CM | POA: Diagnosis not present

## 2014-11-28 DIAGNOSIS — I10 Essential (primary) hypertension: Secondary | ICD-10-CM | POA: Diagnosis not present

## 2014-11-28 DIAGNOSIS — E119 Type 2 diabetes mellitus without complications: Secondary | ICD-10-CM | POA: Diagnosis not present

## 2014-12-04 ENCOUNTER — Encounter (HOSPITAL_COMMUNITY)
Admission: RE | Admit: 2014-12-04 | Discharge: 2014-12-04 | Disposition: A | Discharge status: Home / Self Care | Payer: Medicaid Other | Source: Skilled Nursing Facility | Attending: Internal Medicine | Admitting: Internal Medicine

## 2014-12-04 DIAGNOSIS — I10 Essential (primary) hypertension: Secondary | ICD-10-CM | POA: Diagnosis not present

## 2014-12-04 DIAGNOSIS — S82002D Unspecified fracture of left patella, subsequent encounter for closed fracture with routine healing: Secondary | ICD-10-CM | POA: Diagnosis not present

## 2014-12-04 DIAGNOSIS — E782 Mixed hyperlipidemia: Secondary | ICD-10-CM | POA: Diagnosis not present

## 2014-12-04 DIAGNOSIS — L089 Local infection of the skin and subcutaneous tissue, unspecified: Secondary | ICD-10-CM | POA: Diagnosis not present

## 2014-12-04 DIAGNOSIS — T814XXA Infection following a procedure, initial encounter: Secondary | ICD-10-CM | POA: Diagnosis not present

## 2014-12-04 DIAGNOSIS — R262 Difficulty in walking, not elsewhere classified: Secondary | ICD-10-CM | POA: Diagnosis not present

## 2014-12-04 DIAGNOSIS — R488 Other symbolic dysfunctions: Secondary | ICD-10-CM | POA: Diagnosis not present

## 2014-12-04 DIAGNOSIS — M1711 Unilateral primary osteoarthritis, right knee: Secondary | ICD-10-CM | POA: Diagnosis not present

## 2014-12-04 DIAGNOSIS — Z7401 Bed confinement status: Secondary | ICD-10-CM | POA: Diagnosis not present

## 2014-12-04 DIAGNOSIS — I959 Hypotension, unspecified: Secondary | ICD-10-CM | POA: Diagnosis not present

## 2014-12-04 DIAGNOSIS — S42201D Unspecified fracture of upper end of right humerus, subsequent encounter for fracture with routine healing: Secondary | ICD-10-CM | POA: Diagnosis not present

## 2014-12-04 DIAGNOSIS — R509 Fever, unspecified: Secondary | ICD-10-CM | POA: Diagnosis not present

## 2014-12-04 DIAGNOSIS — M6281 Muscle weakness (generalized): Secondary | ICD-10-CM | POA: Diagnosis not present

## 2014-12-04 DIAGNOSIS — Z9181 History of falling: Secondary | ICD-10-CM | POA: Diagnosis not present

## 2014-12-04 DIAGNOSIS — N390 Urinary tract infection, site not specified: Secondary | ICD-10-CM | POA: Diagnosis not present

## 2014-12-04 DIAGNOSIS — R278 Other lack of coordination: Secondary | ICD-10-CM | POA: Diagnosis not present

## 2014-12-04 DIAGNOSIS — E119 Type 2 diabetes mellitus without complications: Secondary | ICD-10-CM | POA: Diagnosis not present

## 2014-12-04 DIAGNOSIS — Z4789 Encounter for other orthopedic aftercare: Secondary | ICD-10-CM | POA: Diagnosis not present

## 2014-12-04 LAB — URINALYSIS W MICROSCOPIC (NOT AT ARMC)
Bilirubin Urine: NEGATIVE
Glucose, UA: NEGATIVE mg/dL
Ketones, ur: NEGATIVE mg/dL
Nitrite: NEGATIVE
Protein, ur: 100 mg/dL — AB
Specific Gravity, Urine: 1.01 (ref 1.005–1.030)
Urobilinogen, UA: 1 mg/dL (ref 0.0–1.0)
pH: >9 — ABNORMAL HIGH (ref 5.0–8.0)

## 2014-12-04 LAB — CBC WITH DIFFERENTIAL/PLATELET
Basophils Absolute: 0 10*3/uL (ref 0.0–0.1)
Basophils Relative: 0 % (ref 0–1)
Eosinophils Absolute: 0.1 10*3/uL (ref 0.0–0.7)
Eosinophils Relative: 1 % (ref 0–5)
HCT: 27.8 % — ABNORMAL LOW (ref 36.0–46.0)
Hemoglobin: 9.6 g/dL — ABNORMAL LOW (ref 12.0–15.0)
Lymphocytes Relative: 19 % (ref 12–46)
Lymphs Abs: 2.1 10*3/uL (ref 0.7–4.0)
MCH: 32.9 pg (ref 26.0–34.0)
MCHC: 34.5 g/dL (ref 30.0–36.0)
MCV: 95.2 fL (ref 78.0–100.0)
Monocytes Absolute: 0.7 10*3/uL (ref 0.1–1.0)
Monocytes Relative: 6 % (ref 3–12)
Neutro Abs: 8.3 10*3/uL — ABNORMAL HIGH (ref 1.7–7.7)
Neutrophils Relative %: 74 % (ref 43–77)
Platelets: 449 10*3/uL — ABNORMAL HIGH (ref 150–400)
RBC: 2.92 MIL/uL — ABNORMAL LOW (ref 3.87–5.11)
RDW: 13.1 % (ref 11.5–15.5)
WBC: 11.1 10*3/uL — ABNORMAL HIGH (ref 4.0–10.5)

## 2014-12-04 LAB — SEDIMENTATION RATE: Sed Rate: 125 mm/hr — ABNORMAL HIGH (ref 0–22)

## 2014-12-06 DIAGNOSIS — I4892 Unspecified atrial flutter: Secondary | ICD-10-CM | POA: Diagnosis not present

## 2014-12-06 DIAGNOSIS — I10 Essential (primary) hypertension: Secondary | ICD-10-CM | POA: Diagnosis not present

## 2014-12-06 DIAGNOSIS — M6281 Muscle weakness (generalized): Secondary | ICD-10-CM | POA: Diagnosis not present

## 2014-12-06 DIAGNOSIS — S82002A Unspecified fracture of left patella, initial encounter for closed fracture: Secondary | ICD-10-CM | POA: Diagnosis not present

## 2014-12-06 DIAGNOSIS — R488 Other symbolic dysfunctions: Secondary | ICD-10-CM | POA: Diagnosis not present

## 2014-12-06 DIAGNOSIS — Z7901 Long term (current) use of anticoagulants: Secondary | ICD-10-CM | POA: Diagnosis not present

## 2014-12-06 DIAGNOSIS — R278 Other lack of coordination: Secondary | ICD-10-CM | POA: Diagnosis not present

## 2014-12-06 DIAGNOSIS — Z4789 Encounter for other orthopedic aftercare: Secondary | ICD-10-CM | POA: Diagnosis not present

## 2014-12-06 DIAGNOSIS — M009 Pyogenic arthritis, unspecified: Secondary | ICD-10-CM | POA: Diagnosis not present

## 2014-12-06 DIAGNOSIS — I959 Hypotension, unspecified: Secondary | ICD-10-CM | POA: Diagnosis not present

## 2014-12-06 DIAGNOSIS — Z794 Long term (current) use of insulin: Secondary | ICD-10-CM | POA: Diagnosis not present

## 2014-12-06 DIAGNOSIS — E119 Type 2 diabetes mellitus without complications: Secondary | ICD-10-CM | POA: Diagnosis not present

## 2014-12-06 DIAGNOSIS — S82002D Unspecified fracture of left patella, subsequent encounter for closed fracture with routine healing: Secondary | ICD-10-CM | POA: Diagnosis not present

## 2014-12-06 DIAGNOSIS — Z7401 Bed confinement status: Secondary | ICD-10-CM | POA: Diagnosis not present

## 2014-12-06 DIAGNOSIS — I248 Other forms of acute ischemic heart disease: Secondary | ICD-10-CM | POA: Diagnosis not present

## 2014-12-06 DIAGNOSIS — I480 Paroxysmal atrial fibrillation: Secondary | ICD-10-CM | POA: Diagnosis not present

## 2014-12-06 DIAGNOSIS — S42201D Unspecified fracture of upper end of right humerus, subsequent encounter for fracture with routine healing: Secondary | ICD-10-CM | POA: Diagnosis not present

## 2014-12-06 DIAGNOSIS — I129 Hypertensive chronic kidney disease with stage 1 through stage 4 chronic kidney disease, or unspecified chronic kidney disease: Secondary | ICD-10-CM | POA: Diagnosis not present

## 2014-12-06 DIAGNOSIS — M00062 Staphylococcal arthritis, left knee: Secondary | ICD-10-CM | POA: Diagnosis not present

## 2014-12-06 DIAGNOSIS — A4102 Sepsis due to Methicillin resistant Staphylococcus aureus: Secondary | ICD-10-CM | POA: Diagnosis not present

## 2014-12-06 DIAGNOSIS — N179 Acute kidney failure, unspecified: Secondary | ICD-10-CM | POA: Diagnosis not present

## 2014-12-06 DIAGNOSIS — I4891 Unspecified atrial fibrillation: Secondary | ICD-10-CM | POA: Diagnosis not present

## 2014-12-06 DIAGNOSIS — N390 Urinary tract infection, site not specified: Secondary | ICD-10-CM | POA: Diagnosis not present

## 2014-12-06 DIAGNOSIS — N183 Chronic kidney disease, stage 3 (moderate): Secondary | ICD-10-CM | POA: Diagnosis not present

## 2014-12-06 DIAGNOSIS — L089 Local infection of the skin and subcutaneous tissue, unspecified: Secondary | ICD-10-CM | POA: Diagnosis not present

## 2014-12-06 DIAGNOSIS — B9562 Methicillin resistant Staphylococcus aureus infection as the cause of diseases classified elsewhere: Secondary | ICD-10-CM | POA: Diagnosis not present

## 2014-12-06 DIAGNOSIS — R7881 Bacteremia: Secondary | ICD-10-CM | POA: Diagnosis not present

## 2014-12-06 DIAGNOSIS — R000 Tachycardia, unspecified: Secondary | ICD-10-CM | POA: Diagnosis not present

## 2014-12-06 DIAGNOSIS — S42294A Other nondisplaced fracture of upper end of right humerus, initial encounter for closed fracture: Secondary | ICD-10-CM | POA: Diagnosis not present

## 2014-12-06 DIAGNOSIS — K649 Unspecified hemorrhoids: Secondary | ICD-10-CM | POA: Diagnosis not present

## 2014-12-06 DIAGNOSIS — E782 Mixed hyperlipidemia: Secondary | ICD-10-CM | POA: Diagnosis not present

## 2014-12-06 DIAGNOSIS — Y838 Other surgical procedures as the cause of abnormal reaction of the patient, or of later complication, without mention of misadventure at the time of the procedure: Secondary | ICD-10-CM | POA: Diagnosis present

## 2014-12-06 DIAGNOSIS — E785 Hyperlipidemia, unspecified: Secondary | ICD-10-CM | POA: Diagnosis not present

## 2014-12-06 DIAGNOSIS — F329 Major depressive disorder, single episode, unspecified: Secondary | ICD-10-CM | POA: Diagnosis not present

## 2014-12-06 DIAGNOSIS — Z9181 History of falling: Secondary | ICD-10-CM | POA: Diagnosis not present

## 2014-12-06 DIAGNOSIS — R262 Difficulty in walking, not elsewhere classified: Secondary | ICD-10-CM | POA: Diagnosis not present

## 2014-12-06 DIAGNOSIS — R509 Fever, unspecified: Secondary | ICD-10-CM | POA: Diagnosis not present

## 2014-12-06 DIAGNOSIS — Z7982 Long term (current) use of aspirin: Secondary | ICD-10-CM | POA: Diagnosis not present

## 2014-12-06 DIAGNOSIS — Z79899 Other long term (current) drug therapy: Secondary | ICD-10-CM | POA: Diagnosis not present

## 2014-12-06 DIAGNOSIS — T814XXA Infection following a procedure, initial encounter: Secondary | ICD-10-CM | POA: Diagnosis not present

## 2014-12-08 LAB — URINE CULTURE: Colony Count: >=100000

## 2014-12-14 DIAGNOSIS — S42294A Other nondisplaced fracture of upper end of right humerus, initial encounter for closed fracture: Secondary | ICD-10-CM | POA: Diagnosis not present

## 2014-12-19 ENCOUNTER — Encounter (HOSPITAL_COMMUNITY)
Admission: RE | Admit: 2014-12-19 | Discharge: 2014-12-19 | Disposition: A | Discharge status: Home / Self Care | Payer: Medicaid Other | Source: Skilled Nursing Facility | Attending: Internal Medicine | Admitting: Internal Medicine

## 2014-12-19 LAB — CBC WITH DIFFERENTIAL/PLATELET
Basophils Absolute: 0 10*3/uL (ref 0.0–0.1)
Basophils Relative: 1 % (ref 0–1)
Eosinophils Absolute: 0.1 10*3/uL (ref 0.0–0.7)
Eosinophils Relative: 2 % (ref 0–5)
HCT: 27.5 % — ABNORMAL LOW (ref 36.0–46.0)
Hemoglobin: 9.2 g/dL — ABNORMAL LOW (ref 12.0–15.0)
Lymphocytes Relative: 21 % (ref 12–46)
Lymphs Abs: 1.4 10*3/uL (ref 0.7–4.0)
MCH: 31.1 pg (ref 26.0–34.0)
MCHC: 33.5 g/dL (ref 30.0–36.0)
MCV: 92.9 fL (ref 78.0–100.0)
Monocytes Absolute: 0.6 10*3/uL (ref 0.1–1.0)
Monocytes Relative: 10 % (ref 3–12)
Neutro Abs: 4.2 10*3/uL (ref 1.7–7.7)
Neutrophils Relative %: 66 % (ref 43–77)
Platelets: 322 10*3/uL (ref 150–400)
RBC: 2.96 MIL/uL — ABNORMAL LOW (ref 3.87–5.11)
RDW: 13.9 % (ref 11.5–15.5)
WBC: 6.3 10*3/uL (ref 4.0–10.5)

## 2014-12-22 ENCOUNTER — Emergency Department (HOSPITAL_COMMUNITY): Payer: Medicare Other

## 2014-12-22 ENCOUNTER — Encounter (HOSPITAL_COMMUNITY): Payer: Self-pay | Admitting: *Deleted

## 2014-12-22 ENCOUNTER — Inpatient Hospital Stay (HOSPITAL_COMMUNITY)
Admission: EM | Admit: 2014-12-22 | Discharge: 2014-12-29 | DRG: 856 | Disposition: A | Discharge status: Skilled Nursing Facility | Payer: Medicare Other | Attending: Internal Medicine | Admitting: Internal Medicine

## 2014-12-22 DIAGNOSIS — I480 Paroxysmal atrial fibrillation: Secondary | ICD-10-CM | POA: Diagnosis present

## 2014-12-22 DIAGNOSIS — R000 Tachycardia, unspecified: Secondary | ICD-10-CM | POA: Diagnosis not present

## 2014-12-22 DIAGNOSIS — E782 Mixed hyperlipidemia: Secondary | ICD-10-CM | POA: Diagnosis not present

## 2014-12-22 DIAGNOSIS — Z794 Long term (current) use of insulin: Secondary | ICD-10-CM | POA: Diagnosis not present

## 2014-12-22 DIAGNOSIS — R488 Other symbolic dysfunctions: Secondary | ICD-10-CM | POA: Diagnosis not present

## 2014-12-22 DIAGNOSIS — S82892S Other fracture of left lower leg, sequela: Secondary | ICD-10-CM

## 2014-12-22 DIAGNOSIS — R7881 Bacteremia: Secondary | ICD-10-CM | POA: Diagnosis not present

## 2014-12-22 DIAGNOSIS — S82041G Displaced comminuted fracture of right patella, subsequent encounter for closed fracture with delayed healing: Secondary | ICD-10-CM | POA: Diagnosis not present

## 2014-12-22 DIAGNOSIS — E1121 Type 2 diabetes mellitus with diabetic nephropathy: Secondary | ICD-10-CM | POA: Diagnosis not present

## 2014-12-22 DIAGNOSIS — Z7901 Long term (current) use of anticoagulants: Secondary | ICD-10-CM

## 2014-12-22 DIAGNOSIS — R278 Other lack of coordination: Secondary | ICD-10-CM | POA: Diagnosis not present

## 2014-12-22 DIAGNOSIS — I248 Other forms of acute ischemic heart disease: Secondary | ICD-10-CM | POA: Diagnosis present

## 2014-12-22 DIAGNOSIS — I4892 Unspecified atrial flutter: Secondary | ICD-10-CM | POA: Diagnosis present

## 2014-12-22 DIAGNOSIS — I129 Hypertensive chronic kidney disease with stage 1 through stage 4 chronic kidney disease, or unspecified chronic kidney disease: Secondary | ICD-10-CM | POA: Diagnosis present

## 2014-12-22 DIAGNOSIS — S82002D Unspecified fracture of left patella, subsequent encounter for closed fracture with routine healing: Secondary | ICD-10-CM | POA: Diagnosis not present

## 2014-12-22 DIAGNOSIS — Z9181 History of falling: Secondary | ICD-10-CM | POA: Diagnosis not present

## 2014-12-22 DIAGNOSIS — Z7982 Long term (current) use of aspirin: Secondary | ICD-10-CM

## 2014-12-22 DIAGNOSIS — I4891 Unspecified atrial fibrillation: Secondary | ICD-10-CM

## 2014-12-22 DIAGNOSIS — Z79899 Other long term (current) drug therapy: Secondary | ICD-10-CM

## 2014-12-22 DIAGNOSIS — F329 Major depressive disorder, single episode, unspecified: Secondary | ICD-10-CM | POA: Diagnosis present

## 2014-12-22 DIAGNOSIS — B9562 Methicillin resistant Staphylococcus aureus infection as the cause of diseases classified elsewhere: Secondary | ICD-10-CM | POA: Diagnosis not present

## 2014-12-22 DIAGNOSIS — A419 Sepsis, unspecified organism: Secondary | ICD-10-CM | POA: Diagnosis not present

## 2014-12-22 DIAGNOSIS — A4102 Sepsis due to Methicillin resistant Staphylococcus aureus: Secondary | ICD-10-CM | POA: Diagnosis present

## 2014-12-22 DIAGNOSIS — N179 Acute kidney failure, unspecified: Secondary | ICD-10-CM | POA: Diagnosis not present

## 2014-12-22 DIAGNOSIS — I10 Essential (primary) hypertension: Secondary | ICD-10-CM | POA: Diagnosis present

## 2014-12-22 DIAGNOSIS — Y838 Other surgical procedures as the cause of abnormal reaction of the patient, or of later complication, without mention of misadventure at the time of the procedure: Secondary | ICD-10-CM | POA: Diagnosis present

## 2014-12-22 DIAGNOSIS — M009 Pyogenic arthritis, unspecified: Secondary | ICD-10-CM | POA: Diagnosis not present

## 2014-12-22 DIAGNOSIS — R509 Fever, unspecified: Secondary | ICD-10-CM | POA: Diagnosis not present

## 2014-12-22 DIAGNOSIS — F32A Depression, unspecified: Secondary | ICD-10-CM | POA: Diagnosis present

## 2014-12-22 DIAGNOSIS — M25562 Pain in left knee: Secondary | ICD-10-CM | POA: Diagnosis not present

## 2014-12-22 DIAGNOSIS — S42201D Unspecified fracture of upper end of right humerus, subsequent encounter for fracture with routine healing: Secondary | ICD-10-CM | POA: Diagnosis not present

## 2014-12-22 DIAGNOSIS — Z4789 Encounter for other orthopedic aftercare: Secondary | ICD-10-CM | POA: Diagnosis not present

## 2014-12-22 DIAGNOSIS — T814XXA Infection following a procedure, initial encounter: Secondary | ICD-10-CM | POA: Diagnosis not present

## 2014-12-22 DIAGNOSIS — IMO0001 Reserved for inherently not codable concepts without codable children: Secondary | ICD-10-CM | POA: Insufficient documentation

## 2014-12-22 DIAGNOSIS — E785 Hyperlipidemia, unspecified: Secondary | ICD-10-CM | POA: Diagnosis present

## 2014-12-22 DIAGNOSIS — R262 Difficulty in walking, not elsewhere classified: Secondary | ICD-10-CM | POA: Diagnosis not present

## 2014-12-22 DIAGNOSIS — R609 Edema, unspecified: Secondary | ICD-10-CM | POA: Diagnosis not present

## 2014-12-22 DIAGNOSIS — L089 Local infection of the skin and subcutaneous tissue, unspecified: Secondary | ICD-10-CM | POA: Diagnosis not present

## 2014-12-22 DIAGNOSIS — S82002A Unspecified fracture of left patella, initial encounter for closed fracture: Secondary | ICD-10-CM | POA: Diagnosis present

## 2014-12-22 DIAGNOSIS — IMO0002 Reserved for concepts with insufficient information to code with codable children: Secondary | ICD-10-CM | POA: Diagnosis present

## 2014-12-22 DIAGNOSIS — K649 Unspecified hemorrhoids: Secondary | ICD-10-CM | POA: Diagnosis present

## 2014-12-22 DIAGNOSIS — N183 Chronic kidney disease, stage 3 (moderate): Secondary | ICD-10-CM | POA: Diagnosis present

## 2014-12-22 DIAGNOSIS — M00062 Staphylococcal arthritis, left knee: Secondary | ICD-10-CM | POA: Diagnosis not present

## 2014-12-22 DIAGNOSIS — E119 Type 2 diabetes mellitus without complications: Secondary | ICD-10-CM | POA: Diagnosis present

## 2014-12-22 DIAGNOSIS — S82092A Other fracture of left patella, initial encounter for closed fracture: Secondary | ICD-10-CM | POA: Diagnosis not present

## 2014-12-22 DIAGNOSIS — M6281 Muscle weakness (generalized): Secondary | ICD-10-CM | POA: Diagnosis not present

## 2014-12-22 DIAGNOSIS — Z8781 Personal history of (healed) traumatic fracture: Secondary | ICD-10-CM | POA: Insufficient documentation

## 2014-12-22 HISTORY — DX: Major depressive disorder, single episode, unspecified: F32.9

## 2014-12-22 HISTORY — DX: Depression, unspecified: F32.A

## 2014-12-22 HISTORY — DX: Hyperlipidemia, unspecified: E78.5

## 2014-12-22 LAB — BASIC METABOLIC PANEL
Anion gap: 10 (ref 5–15)
BUN: 30 mg/dL — ABNORMAL HIGH (ref 6–23)
CO2: 25 mmol/L (ref 19–32)
Calcium: 8.4 mg/dL (ref 8.4–10.5)
Chloride: 94 mmol/L — ABNORMAL LOW (ref 96–112)
Creatinine, Ser: 1.59 mg/dL — ABNORMAL HIGH (ref 0.50–1.10)
GFR calc Af Amer: 34 mL/min — ABNORMAL LOW (ref >90)
GFR calc non Af Amer: 29 mL/min — ABNORMAL LOW (ref >90)
Glucose, Bld: 225 mg/dL — ABNORMAL HIGH (ref 70–99)
Potassium: 4.3 mmol/L (ref 3.5–5.1)
Sodium: 129 mmol/L — ABNORMAL LOW (ref 135–145)

## 2014-12-22 LAB — CBC WITH DIFFERENTIAL/PLATELET
Basophils Absolute: 0 10*3/uL (ref 0.0–0.1)
Basophils Relative: 0 % (ref 0–1)
Eosinophils Absolute: 0 10*3/uL (ref 0.0–0.7)
Eosinophils Relative: 0 % (ref 0–5)
HCT: 26.6 % — ABNORMAL LOW (ref 36.0–46.0)
Hemoglobin: 9.1 g/dL — ABNORMAL LOW (ref 12.0–15.0)
Lymphocytes Relative: 7 % — ABNORMAL LOW (ref 12–46)
Lymphs Abs: 0.9 10*3/uL (ref 0.7–4.0)
MCH: 30.5 pg (ref 26.0–34.0)
MCHC: 34.2 g/dL (ref 30.0–36.0)
MCV: 89.3 fL (ref 78.0–100.0)
Monocytes Absolute: 0.6 10*3/uL (ref 0.1–1.0)
Monocytes Relative: 5 % (ref 3–12)
Neutro Abs: 11.2 10*3/uL — ABNORMAL HIGH (ref 1.7–7.7)
Neutrophils Relative %: 88 % — ABNORMAL HIGH (ref 43–77)
Platelets: 354 10*3/uL (ref 150–400)
RBC: 2.98 MIL/uL — ABNORMAL LOW (ref 3.87–5.11)
RDW: 14 % (ref 11.5–15.5)
WBC: 12.8 10*3/uL — ABNORMAL HIGH (ref 4.0–10.5)

## 2014-12-22 LAB — URINALYSIS, ROUTINE W REFLEX MICROSCOPIC
Bilirubin Urine: NEGATIVE
Glucose, UA: NEGATIVE mg/dL
Hgb urine dipstick: NEGATIVE
Ketones, ur: NEGATIVE mg/dL
Leukocytes, UA: NEGATIVE
Nitrite: NEGATIVE
Protein, ur: NEGATIVE mg/dL
Specific Gravity, Urine: 1.015 (ref 1.005–1.030)
Urobilinogen, UA: 0.2 mg/dL (ref 0.0–1.0)
pH: 5 (ref 5.0–8.0)

## 2014-12-22 LAB — PROTIME-INR
INR: 1.46 (ref 0.00–1.49)
Prothrombin Time: 17.8 seconds — ABNORMAL HIGH (ref 11.6–15.2)

## 2014-12-22 LAB — I-STAT CG4 LACTIC ACID, ED: Lactic Acid, Venous: 1.94 mmol/L (ref 0.5–2.0)

## 2014-12-22 LAB — GLUCOSE, CAPILLARY: Glucose-Capillary: 144 mg/dL — ABNORMAL HIGH (ref 70–99)

## 2014-12-22 LAB — APTT: aPTT: 40 seconds — ABNORMAL HIGH (ref 24–37)

## 2014-12-22 MED ORDER — PIPERACILLIN-TAZOBACTAM 3.375 G IVPB 30 MIN
3.3750 g | Freq: Once | INTRAVENOUS | Status: AC
  Administered 2014-12-22: 3.375 g via INTRAVENOUS
  Filled 2014-12-22: qty 50

## 2014-12-22 MED ORDER — INSULIN GLARGINE 100 UNIT/ML ~~LOC~~ SOLN
20.0000 [IU] | Freq: Every day | SUBCUTANEOUS | Status: DC
  Filled 2014-12-22: qty 0.2

## 2014-12-22 MED ORDER — SODIUM CHLORIDE 0.9 % IV SOLN
INTRAVENOUS | Status: DC
  Administered 2014-12-22 – 2014-12-24 (×2): 75 mL/h via INTRAVENOUS
  Administered 2014-12-25 – 2014-12-27 (×2): via INTRAVENOUS

## 2014-12-22 MED ORDER — DILTIAZEM LOAD VIA INFUSION
15.0000 mg | Freq: Once | INTRAVENOUS | Status: AC
  Administered 2014-12-22: 15 mg via INTRAVENOUS
  Filled 2014-12-22: qty 15

## 2014-12-22 MED ORDER — DILTIAZEM HCL 100 MG IV SOLR
5.0000 mg/h | INTRAVENOUS | Status: DC
  Administered 2014-12-22: 5 mg/h via INTRAVENOUS
  Administered 2014-12-23 (×2): 7.5 mg/h via INTRAVENOUS
  Filled 2014-12-22 (×4): qty 100

## 2014-12-22 MED ORDER — DEXTROSE 5 % IV SOLN: 5.0000 mg/h | INTRAVENOUS | Status: DC

## 2014-12-22 MED ORDER — SODIUM CHLORIDE 0.9 % IV BOLUS (SEPSIS)
1000.0000 mL | Freq: Once | INTRAVENOUS | Status: AC
  Administered 2014-12-22: 1000 mL via INTRAVENOUS

## 2014-12-22 MED ORDER — PIPERACILLIN-TAZOBACTAM 3.375 G IVPB
3.3750 g | Freq: Three times a day (TID) | INTRAVENOUS | Status: DC
  Administered 2014-12-23 – 2014-12-24 (×5): 3.375 g via INTRAVENOUS
  Filled 2014-12-22 (×7): qty 50

## 2014-12-22 MED ORDER — OXYCODONE-ACETAMINOPHEN 5-325 MG PO TABS
1.0000 | ORAL_TABLET | ORAL | Status: DC | PRN
  Administered 2014-12-22 – 2014-12-27 (×8): 1 via ORAL
  Filled 2014-12-22 (×8): qty 1

## 2014-12-22 MED ORDER — ACETAMINOPHEN 325 MG PO TABS
650.0000 mg | ORAL_TABLET | Freq: Once | ORAL | Status: AC
  Administered 2014-12-22: 650 mg via ORAL
  Filled 2014-12-22: qty 2

## 2014-12-22 MED ORDER — INSULIN GLARGINE 100 UNIT/ML ~~LOC~~ SOLN
20.0000 [IU] | Freq: Every day | SUBCUTANEOUS | Status: DC
  Administered 2014-12-22 – 2014-12-28 (×7): 20 [IU] via SUBCUTANEOUS
  Filled 2014-12-22 (×10): qty 0.2

## 2014-12-22 MED ORDER — ATORVASTATIN CALCIUM 80 MG PO TABS
80.0000 mg | ORAL_TABLET | Freq: Every day | ORAL | Status: DC
  Administered 2014-12-23 – 2014-12-28 (×6): 80 mg via ORAL
  Filled 2014-12-22 (×7): qty 1

## 2014-12-22 MED ORDER — HYDROXYZINE HCL 50 MG/ML IM SOLN
25.0000 mg | Freq: Four times a day (QID) | INTRAMUSCULAR | Status: DC | PRN
  Filled 2014-12-22: qty 0.5

## 2014-12-22 MED ORDER — INSULIN ASPART 100 UNIT/ML ~~LOC~~ SOLN
0.0000 [IU] | Freq: Three times a day (TID) | SUBCUTANEOUS | Status: DC
  Administered 2014-12-23: 1 [IU] via SUBCUTANEOUS
  Administered 2014-12-24 (×2): 2 [IU] via SUBCUTANEOUS
  Administered 2014-12-25 – 2014-12-27 (×4): 1 [IU] via SUBCUTANEOUS
  Administered 2014-12-29: 2 [IU] via SUBCUTANEOUS
  Administered 2014-12-29: 1 [IU] via SUBCUTANEOUS

## 2014-12-22 MED ORDER — SODIUM CHLORIDE 0.9 % IV BOLUS (SEPSIS)
500.0000 mL | Freq: Once | INTRAVENOUS | Status: AC
  Administered 2014-12-22: 500 mL via INTRAVENOUS

## 2014-12-22 MED ORDER — SODIUM CHLORIDE 0.9 % IJ SOLN
3.0000 mL | Freq: Two times a day (BID) | INTRAMUSCULAR | Status: DC
  Administered 2014-12-22 – 2014-12-27 (×7): 3 mL via INTRAVENOUS

## 2014-12-22 MED ORDER — NYSTATIN 100000 UNIT/GM EX POWD
Freq: Two times a day (BID) | CUTANEOUS | Status: DC
  Administered 2014-12-22 – 2014-12-23 (×3): via TOPICAL
  Administered 2014-12-24: 1 via TOPICAL
  Administered 2014-12-24: 22:00:00 via TOPICAL
  Administered 2014-12-25: 1 via TOPICAL
  Administered 2014-12-25 – 2014-12-29 (×8): via TOPICAL
  Filled 2014-12-22: qty 15

## 2014-12-22 MED ORDER — ACETAMINOPHEN 325 MG PO TABS: 650.0000 mg | ORAL_TABLET | Freq: Four times a day (QID) | ORAL | Status: DC | PRN

## 2014-12-22 MED ORDER — HYDRALAZINE HCL 20 MG/ML IJ SOLN: 5.0000 mg | INTRAMUSCULAR | Status: DC | PRN

## 2014-12-22 MED ORDER — ASPIRIN EC 81 MG PO TBEC
81.0000 mg | DELAYED_RELEASE_TABLET | Freq: Every day | ORAL | Status: DC
  Administered 2014-12-23 – 2014-12-29 (×7): 81 mg via ORAL
  Filled 2014-12-22 (×8): qty 1

## 2014-12-22 MED ORDER — DILTIAZEM LOAD VIA INFUSION: 15.0000 mg | Freq: Once | INTRAVENOUS | Status: DC

## 2014-12-22 MED ORDER — PRO-STAT SUGAR FREE PO LIQD
30.0000 mL | Freq: Every day | ORAL | Status: DC
  Administered 2014-12-23 – 2014-12-28 (×6): 30 mL via ORAL
  Filled 2014-12-22 (×7): qty 30

## 2014-12-22 MED ORDER — MORPHINE SULFATE 2 MG/ML IJ SOLN
2.0000 mg | INTRAMUSCULAR | Status: DC | PRN
  Administered 2014-12-24 – 2014-12-25 (×5): 2 mg via INTRAVENOUS
  Filled 2014-12-22 (×6): qty 1

## 2014-12-22 MED ORDER — GLUCERNA SHAKE PO LIQD
237.0000 mL | Freq: Every day | ORAL | Status: DC
  Administered 2014-12-23 – 2014-12-29 (×6): 237 mL via ORAL

## 2014-12-22 MED ORDER — CEFAZOLIN SODIUM-DEXTROSE 2-3 GM-% IV SOLR
2.0000 g | Freq: Once | INTRAVENOUS | Status: AC
  Administered 2014-12-22: 2 g via INTRAVENOUS
  Filled 2014-12-22: qty 50

## 2014-12-22 MED ORDER — VANCOMYCIN HCL 10 G IV SOLR
1250.0000 mg | INTRAVENOUS | Status: DC
  Administered 2014-12-23 – 2014-12-25 (×4): 1250 mg via INTRAVENOUS
  Filled 2014-12-22 (×6): qty 1250

## 2014-12-22 MED ORDER — HEPARIN SODIUM (PORCINE) 5000 UNIT/ML IJ SOLN
5000.0000 [IU] | Freq: Three times a day (TID) | INTRAMUSCULAR | Status: DC
  Administered 2014-12-22 – 2014-12-27 (×13): 5000 [IU] via SUBCUTANEOUS
  Filled 2014-12-22 (×14): qty 1

## 2014-12-22 NOTE — ED Notes (Signed)
Patient transported to X-ray 

## 2014-12-22 NOTE — ED Notes (Addendum)
Pts HR increased to 148 bpm, new EKG captured and shown to Dr. Tomi Bamberger. Pt denies CP.

## 2014-12-22 NOTE — ED Provider Notes (Addendum)
CSN: [id number]     Arrival date & time 12/22/14  1506 History   First MD Initiated Contact with Patient 12/22/14 1541     Chief Complaint  Patient presents with  . Fever  . Post-op Problem   HPI Patient presents to the emergency room for evaluation of fever and knee swelling. The patient has a history of surgery on her left knee associated with a patella fracture. That was performed on the 18th. She is recovering at a nursing facility. They have reported that she has had some fevers and tachycardia. The patient actually saw her orthopedic doctor today who felt that the wound was healing appropriately. She was sent back to the nursing facility. They were concerned about her knee and they called her primary doctor who suggested she get sent to the emergency room here. Patient had her surgery in [place] at [facility]. She denies any trouble with cough. She denies any dysuria. Past Medical History  Diagnosis Date  . Diabetes mellitus   . Hypertension    Past Surgical History  Procedure Laterality Date  . Cholecystectomy N/A 11/04/2012    Procedure: LAPAROSCOPIC CHOLECYSTECTOMY;  Surgeon: Jamesetta So, MD;  Location: [facility];  Service: General;  Laterality: N/A;  . Knee surgery     History reviewed. No pertinent family history. History  Substance Use Topics  . Smoking status: Never Smoker   . Smokeless tobacco: Not on file  . Alcohol Use: No   OB History    No data available     Review of Systems  All other systems reviewed and are negative.     Allergies  Review of patient's allergies indicates no known allergies.  Home Medications   Prior to Admission medications   Medication Sig Start Date End Date Taking? Authorizing Provider  acetaminophen (TYLENOL) 325 MG tablet Take 650 mg by mouth every 6 (six) hours as needed.   Yes Historical Provider, MD  Amino Acids-Protein Hydrolys (FEEDING SUPPLEMENT, PRO-STAT SUGAR FREE 64,) LIQD Take 30 mLs by mouth at bedtime.   Yes  Historical Provider, MD  aspirin EC 81 MG tablet Take 81 mg by mouth every morning.    Yes Historical Provider, MD  atorvastatin (LIPITOR) 80 MG tablet Take 80 mg by mouth daily at 6 PM.   Yes Historical Provider, MD  feeding supplement, GLUCERNA SHAKE, (GLUCERNA SHAKE) LIQD Take 237 mLs by mouth daily before breakfast.   Yes Historical Provider, MD  hydrochlorothiazide (HYDRODIURIL) 25 MG tablet Take 25 mg by mouth daily.   Yes [name], MD  insulin glargine (LANTUS) 100 UNIT/ML injection Inject 30 Units into the skin at bedtime.   Yes Historical Provider, MD  losartan (COZAAR) 100 MG tablet Take 100 mg by mouth daily.   Yes Historical Provider, MD  oxyCODONE-acetaminophen (PERCOCET/ROXICET) 5-325 MG per tablet Take 1 tablet by mouth every 4 (four) hours as needed for severe pain.   Yes Historical Provider, MD  rivaroxaban (XARELTO) 10 MG TABS tablet Take 10 mg by mouth daily.   Yes Historical Provider, MD  sertraline (ZOLOFT) 25 MG tablet Take 25 mg by mouth daily.   Yes Historical Provider, MD   BP 115/60 mmHg  Pulse 110  Temp(Src) 101.1 F (38.4 C) (Rectal)  Resp 15  Ht 5\' 7"  (1.702 m)  SpO2 96% Physical Exam  Constitutional: She appears well-developed and well-nourished. No distress.  HENT:  Head: Normocephalic and atraumatic.  Right Ear: External ear normal.  Left Ear: External ear normal.  Eyes: Conjunctivae are normal. Right eye exhibits no discharge. Left eye exhibits no discharge. No scleral icterus.  Neck: Neck supple. No tracheal deviation present.  Cardiovascular: Regular rhythm and intact distal pulses.  Tachycardia present.   Pulmonary/Chest: Effort normal and breath sounds normal. No stridor. No respiratory distress. She has no wheezes. She has no rales.  Abdominal: Soft. Bowel sounds are normal. She exhibits no distension. There is no tenderness. There is no rebound and no guarding.  Musculoskeletal: She exhibits no edema.       Left knee: She exhibits swelling and  effusion. She exhibits no erythema. Tenderness found.  Patient does have a left knee brace on, no significant tenderness to palpation, no erythema extending around the knee joint  Neurological: She is alert. She has normal strength. No cranial nerve deficit (no facial droop, extraocular movements intact, no slurred speech) or sensory deficit. She exhibits normal muscle tone. She displays no seizure activity. Coordination normal.  Skin: Skin is warm and dry. No rash noted.  Psychiatric: She has a normal mood and affect.  Nursing note and vitals reviewed.   ED Course  Procedures (including critical care time) CRITICAL CARE Performed by: [id number] Total critical care time: 40  Critical care time was exclusive of separately billable procedures and treating other patients. Critical care was necessary to treat or prevent imminent or life-threatening deterioration. Critical care was time spent personally by me on the following activities: development of treatment plan with patient and/or surrogate as well as nursing, discussions with consultants, evaluation of patient's response to treatment, examination of patient, obtaining history from patient or surrogate, ordering and performing treatments and interventions, ordering and review of laboratory studies, ordering and review of radiographic studies, pulse oximetry and re-evaluation of patient's condition.  Labs Review Labs Reviewed  CBC WITH DIFFERENTIAL/PLATELET - Abnormal; Notable for the following:    WBC 12.8 (*)    RBC 2.98 (*)    Hemoglobin 9.1 (*)    HCT 26.6 (*)    Neutrophils Relative % 88 (*)    Neutro Abs 11.2 (*)    Lymphocytes Relative 7 (*)    All other components within normal limits  BASIC METABOLIC PANEL - Abnormal; Notable for the following:    Sodium 129 (*)    Chloride 94 (*)    Glucose, Bld 225 (*)    BUN 30 (*)    Creatinine, Ser 1.59 (*)    GFR calc non Af Amer 29 (*)    GFR calc Af Amer 34 (*)    All other  components within normal limits  URINALYSIS, ROUTINE W REFLEX MICROSCOPIC - Abnormal; Notable for the following:    Color, Urine AMBER (*)    All other components within normal limits  I-STAT CG4 LACTIC ACID, ED    Imaging Review Dg Chest 2 View  12/22/2014   CLINICAL DATA:  Fever and tachycardia today.  EXAM: CHEST  2 VIEW  COMPARISON:  12/04/2014  FINDINGS: The heart is enlarged but stable. Stable tortuosity and calcification of the thoracic aorta. The lungs are clear. No infiltrates or effusions. The bony thorax is intact.  IMPRESSION: Stable cardiac enlargement.  No acute pulmonary findings.   Electronically Signed   By: Marijo Sanes M.D.   On: 12/22/2014 18:59   Dg Knee Complete 4 Views Left  12/22/2014   CLINICAL DATA:  Status post fall 6 weeks ago with subsequent repair right patellar fracture. Now fever, pain and swelling about the left knee for 2  days.  EXAM: LEFT KNEE - COMPLETE 4+ VIEW  COMPARISON:  Two views left knee 12/04/2014 and 11/17/2014.  FINDINGS: 2 pins and a tension band are identified for fixation of a patellar fracture. The inferior fracture fragment is now distracted 1.6 cm compared to anatomic position and alignment on the 11/17/2014 study. The inferiorly distracted fracture fragment is fragmented and the bone is rarefied. New lucency is seen about fixation pins in the patella. Image bones otherwise appear normal. No joint effusion is identified.  IMPRESSION: The inferior pole of patella fracture fragment is now distracted and fragmented highly suspicious for osteomyelitis with associated soft tissue swelling identified. New lucency about 2 fixation pins is also seen most consistent with loosening and infection.   Electronically Signed   By: Inge Rise M.D.   On: 12/22/2014 16:34     EKG Interpretation   Date/Time:  Thursday December 22 2014 19:14:28 EDT Ventricular Rate:  148 PR Interval:  187 QRS Duration: 88 QT Interval:  318 QTC Calculation: 499 R Axis:    -25 Text Interpretation:  Atrial fibrillation with rapid V-rate vs a flutter  Inferior infarct, old Consider anterior infarct Confirmed by Antonieta Slaven  MD-J,  Kairah Leoni [id number]) on 12/22/2014 7:41:09 PM     Medications  ceFAZolin (ANCEF) IVPB 2 g/50 mL premix (not administered)  diltiazem (CARDIZEM) 1 mg/mL load via infusion 15 mg (not administered)    And  diltiazem (CARDIZEM) 100 mg in dextrose 5 % 100 mL (1 mg/mL) infusion (not administered)  sodium chloride 0.9 % bolus 500 mL (0 mLs Intravenous Stopped 12/22/14 1820)  sodium chloride 0.9 % bolus 1,000 mL (0 mLs Intravenous Stopped 12/22/14 2011)  acetaminophen (TYLENOL) tablet 650 mg (650 mg Oral Given 12/22/14 1915)    MDM   Final diagnoses:  Fever, unspecified fever cause  Atrial fibrillation and flutter    Patient presents here with a fever and knee swelling. Patient saw her orthopedic doctor today who thought that she was healing well. That finding any other source for her fever at this point however and the patient does admit that her left knee has been having more pain recently. I'm concerned about the possibility of infection associated with her surgery although on exam there is no drainage and the joint is not particularly erythematous. Attempted to call her orthopedist, Dr. case. His office does not have anyone on-call for him.  I spoke with Dr. Percell Miller who is on-call here. He agrees with starting the patient on antibiotics. He will see the patient in the morning and try to get in contact with Dr. Case.  Patient also lost she was here had an episode of tachycardia. Appears to be a narrow complex tachycardia consistent with either a flutter 2-1 block or atrial fibrillation. The patient on Cardizem. She may need cardiac consultation. I will consult with the medical service.    Dorie Rank, MD 12/22/14 Philomena Course  Dorie Rank, MD 12/22/14 2013

## 2014-12-22 NOTE — H&P (Addendum)
[facility] History and Physical  [name] [id number] DOB: 11/17/1930 DOA: 12/22/2014  Referring physician: ED physician PCP: [name], MD  Specialists:   Chief Complaint: fever, left knee pain  HPI: Laura Davenport is a 81 y.o. female with past medical history of hypertension, hyperlipidemia, diabetes mellitus, depression, chronic kidney disease-stage III, s/p of left patellar fracture surgery, who presents with left knee swelling, pain and fever.  Patient reports that patient had surgery on her left knee associated with a patella fracture in [place] at [facility] recently. She is recovering at a nursing facility. In the past several days, she developed fevers and worsening pain over her left knee. The patient actually saw her orthopedic doctor today, who felt that the wound was healing appropriately. She was sent back to the nursing facility, but the symptoms has been progressively getting worse. They were concerned about her knee and called her primary doctor who suggested advised to send pt to the emergency room here.    ROS: currently patient denies running nose, ear pain, headaches, cough, chest pain, SOB, abdominal pain, diarrhea, constipation, dysuria, urgency, frequency, hematuria, skin rashes. No unilateral weakness, numbness or tingling sensations. No vision change or hearing loss.  In ED, patient was found to have negative chest x-ray, negative urinalysis, WBC 12.8, temperature 101.1, tachycardia, AoCKD-III. patient is admitted as inpatient for further evaluation and treatment.  Review of Systems: As presented in the history of presenting illness, rest negative.  Where does patient live?  SNF Can patient participate in ADLs? none  Allergy: No Known Allergies  Past Medical History  Diagnosis Date  . Diabetes mellitus   . Hypertension   . HLD (hyperlipidemia)   . Depression     Past Surgical History  Procedure Laterality Date  . Cholecystectomy N/A  11/04/2012    Procedure: LAPAROSCOPIC CHOLECYSTECTOMY;  Surgeon: Jamesetta So, MD;  Location: [facility];  Service: General;  Laterality: N/A;  . Knee surgery      Social History:  reports that she has never smoked. She does not have any smokeless tobacco history on file. She reports that she does not drink alcohol or use illicit drugs.  Family History: History reviewed. Patient could not remember any of her family medical history.  Prior to Admission medications   Medication Sig Start Date End Date Taking? Authorizing Provider  acetaminophen (TYLENOL) 325 MG tablet Take 650 mg by mouth every 6 (six) hours as needed.   Yes Historical Provider, MD  Amino Acids-Protein Hydrolys (FEEDING SUPPLEMENT, PRO-STAT SUGAR FREE 64,) LIQD Take 30 mLs by mouth at bedtime.   Yes Historical Provider, MD  aspirin EC 81 MG tablet Take 81 mg by mouth every morning.    Yes Historical Provider, MD  atorvastatin (LIPITOR) 80 MG tablet Take 80 mg by mouth daily at 6 PM.   Yes Historical Provider, MD  feeding supplement, GLUCERNA SHAKE, (GLUCERNA SHAKE) LIQD Take 237 mLs by mouth daily before breakfast.   Yes Historical Provider, MD  hydrochlorothiazide (HYDRODIURIL) 25 MG tablet Take 25 mg by mouth daily.   Yes [name], MD  insulin glargine (LANTUS) 100 UNIT/ML injection Inject 30 Units into the skin at bedtime.   Yes Historical Provider, MD  losartan (COZAAR) 100 MG tablet Take 100 mg by mouth daily.   Yes Historical Provider, MD  oxyCODONE-acetaminophen (PERCOCET/ROXICET) 5-325 MG per tablet Take 1 tablet by mouth every 4 (four) hours as needed for severe pain.   Yes Historical Provider, MD  rivaroxaban Alveda Reasons)  10 MG TABS tablet Take 10 mg by mouth daily.   Yes Historical Provider, MD  sertraline (ZOLOFT) 25 MG tablet Take 25 mg by mouth daily.   Yes Historical Provider, MD    Physical Exam: Filed Vitals:   12/23/14 0100 12/23/14 0200 12/23/14 0300 12/23/14 0352  BP: 100/46 96/31 100/53 107/52  Pulse: 96 95  99 95  Temp:    98.8 F (37.1 C)  TempSrc:    Oral  Resp: 18 19 20 23   Height:      Weight:      SpO2:    97%   General: Not in acute distress HEENT:       Eyes: PERRL, EOMI, no scleral icterus       ENT: No discharge from the ears and nose, no pharynx injection, no tonsillar enlargement.        Neck: No JVD, no bruit, no mass felt. Cardiac: S1/S2, RRR, No murmurs, No gallops or rubs Pulm: Good air movement bilaterally. Clear to auscultation bilaterally. No rales, wheezing, rhonchi or rubs. Abd: Soft, nondistended, nontender, no rebound pain, no organomegaly, BS present Ext:  2+DP/PT pulse bilaterally. Patient has left knee brace on. There is tenderness, redness and warmth over left knee joint Musculoskeletal: No joint deformities. Skin: No rashes.  Neuro: Alert and oriented X3, cranial nerves II-XII grossly intact, muscle strength 5/5 in all extremeties, sensation to light touch intact.  Psych: Patient is not psychotic, no suicidal or hemocidal ideation.  Labs on Admission:  Basic Metabolic Panel:  Recent Labs Lab 12/22/14 1742 12/23/14 0310  NA 129* 133*  K 4.3 3.6  CL 94* 103  CO2 25 21  GLUCOSE 225* 131*  BUN 30* 26*  CREATININE 1.59* 1.14*  CALCIUM 8.4 7.8*   Liver Function Tests:  Recent Labs Lab 12/23/14 0310  AST 22  ALT 19  ALKPHOS 70  BILITOT 1.1  PROT 6.7  ALBUMIN 1.9*   No results for input(s): LIPASE, AMYLASE in the last 168 hours. No results for input(s): AMMONIA in the last 168 hours. CBC:  Recent Labs Lab 12/19/14 0825 12/22/14 1742 12/23/14 0310  WBC 6.3 12.8* 15.0*  NEUTROABS 4.2 11.2*  --   HGB 9.2* 9.1* 7.7*  HCT 27.5* 26.6* 23.6*  MCV 92.9 89.3 87.4  PLT 322 354 305   Cardiac Enzymes:  Recent Labs Lab 12/22/14 2237 12/23/14 0310  TROPONINI 0.03 0.04*    BNP (last 3 results)  Recent Labs  12/23/14 0310  BNP 154.3*    ProBNP (last 3 results) No results for input(s): PROBNP in the last 8760 hours.  CBG:  Recent  Labs Lab 12/22/14 2225  GLUCAP 144*    Radiological Exams on Admission: Dg Chest 2 View  12/22/2014   CLINICAL DATA:  Fever and tachycardia today.  EXAM: CHEST  2 VIEW  COMPARISON:  12/04/2014  FINDINGS: The heart is enlarged but stable. Stable tortuosity and calcification of the thoracic aorta. The lungs are clear. No infiltrates or effusions. The bony thorax is intact.  IMPRESSION: Stable cardiac enlargement.  No acute pulmonary findings.   Electronically Signed   By: Marijo Sanes M.D.   On: 12/22/2014 18:59   Dg Knee Complete 4 Views Left  12/22/2014   CLINICAL DATA:  Status post fall 6 weeks ago with subsequent repair right patellar fracture. Now fever, pain and swelling about the left knee for 2 days.  EXAM: LEFT KNEE - COMPLETE 4+ VIEW  COMPARISON:  Two views left knee 12/04/2014  and 11/17/2014.  FINDINGS: 2 pins and a tension band are identified for fixation of a patellar fracture. The inferior fracture fragment is now distracted 1.6 cm compared to anatomic position and alignment on the 11/17/2014 study. The inferiorly distracted fracture fragment is fragmented and the bone is rarefied. New lucency is seen about fixation pins in the patella. Image bones otherwise appear normal. No joint effusion is identified.  IMPRESSION: The inferior pole of patella fracture fragment is now distracted and fragmented highly suspicious for osteomyelitis with associated soft tissue swelling identified. New lucency about 2 fixation pins is also seen most consistent with loosening and infection.   Electronically Signed   By: Inge Rise M.D.   On: 12/22/2014 16:34    EKG: Independently reviewed.  Abnormal findings:  QTc interval 499, left axis deviation, tachycardia, poor R-wave progression. Heart rate 148, indicating possible flutter with 2:1 block.    Assessment/Plan Principal Problem:   Fever Active Problems:   Diabetes mellitus without complication   Hypertension   Depression   Knee fracture,  left   Sepsis   Tachycardia with 141 - 160 beats per minute  Fever, left knee pain and sepsis:  It is most likely due to post surgery infection. Currently patient is mildly septic, with leukocytosis, fever, tachycardia. She is hemodynamically stable. Ortho was consulted - Admit to SDU due to the need of cardizem gtt - Empiric antimicrobial treatment with vancomycin IV and zosyn - Blood cultures x 2 )  - Symptomatic treatment: prn Tylenol for fever and Percocet for pain; hydroxyzine for nausea (QTc interval 499, not good candidate for Zofran) - will get Procalcitonin and trend lactic acid level per sepsis protocol - IVF: received 3L of NS bolus in ED, followed by 75 cc/h - INR/PTT and type/screen - follow up ortho recommendations  Tachycardia: Heart rate 148, indicating possible flutter with 2:1 block. This is most likely due to sepsis, but need to rule out other cardiac issues, such as ischemic change. -trop x 3 -ekg in AM -2d echo  -cardizem gtt -ASA  Addendum:  Possible Atrial Flatter: Heart rate 148, indicating possible flutter with 2:1 block. This is most likely due to sepsis, but needs to rule out other cardiac issues, such as ischemic change. If it is from sepsis, it may be transient and resolves quickly. CHA2DS2-VASc Score is 5. Patient may needs oral anticoagulation if it becomes persistent after sepsis is controled. For now, will hold anticoagulants and do the work up as follows. -trop x 3 -ekg in AM -2d echo  -cardizem gtt -ASA  AoCKD-III: Patient's previous GFR was 59 on 10/05/12, indicating chronic kidney disease. Her creatinine was 1.02 at the baseline. Creatinine is 1.59 on admission, BUN 30. Most likely due to sepsis and pre-renal failure. -check FeNa and US-renal -hold losartan and HCTZ -IVF as above  HTN: Blood pressure is soft due to sepsis -Hold losartan and HCTZ due to AoCKD -prn hydralazine  Diabetes mellitus: No C on record. Patient is on Lantus 30 units  daily  -decrease Lantus dose from 30-20 units daily -sliding scale insulin  -A1c  Hyperlipidemia: Patient is on Lipitor at home. No LDL record. -Continue Lipitor -FLP   DVT ppx: SQ Heparin    Code Status: Full code Family Communication:   Yes, patient's  daughter     at bed side Disposition Plan: Admit to inpatient   Date of Service 12/23/2014    Ivor Costa [facility] Pager [phone]  If 7PM-7AM, please contact night-coverage  [email or web address] Password [email or web address] 12/23/2014, 7:19 AM

## 2014-12-22 NOTE — Progress Notes (Signed)
Called ED x2 for report, no answer. Will call back in 47mins.

## 2014-12-22 NOTE — Progress Notes (Signed)
ANTIBIOTIC CONSULT NOTE - INITIAL  Pharmacy Consult for Vancomycin and Zosyn Indication: Osteomyelitis  No Known Allergies  Patient Measurements: Height: 5\' 7"  (170.2 cm) Weight: 195 lb 5.2 oz (88.6 kg) IBW/kg (Calculated) : 61.6 Adjusted Body Weight:   Vital Signs: Temp: 99.2 F (37.3 C) (04/21 2048) Temp Source: Oral (04/21 2048) BP: 122/52 mmHg (04/21 2115) Pulse Rate: 113 (04/21 2115) Intake/Output from previous day:   Intake/Output from this shift: Total I/O In: 1050 [IV Piggyback:1050] Out: -   Labs:  Recent Labs  12/22/14 1742  WBC 12.8*  HGB 9.1*  PLT 354  CREATININE 1.59*   Estimated Creatinine Clearance: 31.2 mL/min (by C-G formula based on Cr of 1.59). No results for input(s): VANCOTROUGH, VANCOPEAK, VANCORANDOM, GENTTROUGH, GENTPEAK, GENTRANDOM, TOBRATROUGH, TOBRAPEAK, TOBRARND, AMIKACINPEAK, AMIKACINTROU, AMIKACIN in the last 72 hours.   Microbiology: Recent Results (from the past 720 hour(s))  Urine culture     Status: None   Collection Time: 12/04/14 11:05 AM  Result Value Ref Range Status   Specimen Description URINE, CLEAN CATCH  Final   Special Requests NONE  Final   Colony Count   Final    >=100,000 COLONIES/ML Performed at [facility]    Culture   Final    [place] Performed at [facility]    Report Status 12/08/2014 FINAL  Final   Organism ID, Bacteria ESCHERICHIA COLI  Final   Organism ID, Bacteria PROTEUS MIRABILIS  Final      Susceptibility   Escherichia coli - MIC*    AMPICILLIN <=2 SENSITIVE Sensitive     CEFAZOLIN <=4 SENSITIVE Sensitive     CEFTRIAXONE <=1 SENSITIVE Sensitive     CIPROFLOXACIN <=0.25 SENSITIVE Sensitive     GENTAMICIN <=1 SENSITIVE Sensitive     LEVOFLOXACIN <=0.12 SENSITIVE Sensitive     NITROFURANTOIN <=16 SENSITIVE Sensitive     TOBRAMYCIN <=1 SENSITIVE Sensitive     TRIMETH/SULFA <=20 SENSITIVE Sensitive     PIP/TAZO <=4 SENSITIVE Sensitive     *  ESCHERICHIA COLI   Proteus mirabilis - MIC*    AMPICILLIN <=2 SENSITIVE Sensitive     CEFAZOLIN <=4 SENSITIVE Sensitive     CEFTRIAXONE <=1 SENSITIVE Sensitive     CIPROFLOXACIN >=4 RESISTANT Resistant     GENTAMICIN <=1 SENSITIVE Sensitive     LEVOFLOXACIN >=8 RESISTANT Resistant     NITROFURANTOIN 128 RESISTANT Resistant     TOBRAMYCIN <=1 SENSITIVE Sensitive     TRIMETH/SULFA <=20 SENSITIVE Sensitive     PIP/TAZO <=4 SENSITIVE Sensitive     * PROTEUS MIRABILIS    Medical History: Past Medical History  Diagnosis Date  . Diabetes mellitus   . Hypertension   . HLD (hyperlipidemia)   . Depression     Medications:  Prescriptions prior to admission  Medication Sig Dispense Refill Last Dose  . acetaminophen (TYLENOL) 325 MG tablet Take 650 mg by mouth every 6 (six) hours as needed.   12/22/2014 at Unknown time  . Amino Acids-Protein Hydrolys (FEEDING SUPPLEMENT, PRO-STAT SUGAR FREE 64,) LIQD Take 30 mLs by mouth at bedtime.   12/22/2014 at Unknown time  . aspirin EC 81 MG tablet Take 81 mg by mouth every morning.    12/22/2014 at Unknown time  . atorvastatin (LIPITOR) 80 MG tablet Take 80 mg by mouth daily at 6 PM.   12/21/2014 at Unknown time  . feeding supplement, GLUCERNA SHAKE, (GLUCERNA SHAKE) LIQD Take 237 mLs by mouth daily before breakfast.   12/22/2014  at Unknown time  . hydrochlorothiazide (HYDRODIURIL) 25 MG tablet Take 25 mg by mouth daily.   12/22/2014 at Unknown time  . insulin glargine (LANTUS) 100 UNIT/ML injection Inject 30 Units into the skin at bedtime.   12/21/2014 at Unknown time  . losartan (COZAAR) 100 MG tablet Take 100 mg by mouth daily.   12/22/2014 at Unknown time  . oxyCODONE-acetaminophen (PERCOCET/ROXICET) 5-325 MG per tablet Take 1 tablet by mouth every 4 (four) hours as needed for severe pain.   12/22/2014 at Unknown time  . rivaroxaban (XARELTO) 10 MG TABS tablet Take 10 mg by mouth daily.   12/22/2014 at Unknown time  . sertraline (ZOLOFT) 25 MG tablet Take 25  mg by mouth daily.   12/22/2014 at Unknown time   Scheduled:  . [START ON 12/23/2014] aspirin EC  81 mg Oral Daily  . [START ON 12/23/2014] atorvastatin  80 mg Oral q1800  . [START ON 12/23/2014] feeding supplement (GLUCERNA SHAKE)  237 mL Oral QAC breakfast  . [START ON 12/23/2014] feeding supplement (PRO-STAT SUGAR FREE 64)  30 mL Oral QHS  . heparin  5,000 Units Subcutaneous 3 times per day  . [START ON 12/23/2014] insulin aspart  0-9 Units Subcutaneous TID WC  . insulin glargine  20 Units Subcutaneous QHS  . piperacillin-tazobactam  3.375 g Intravenous Once  . sodium chloride  3 mL Intravenous Q12H   Infusions:  . sodium chloride    . diltiazem (CARDIZEM) infusion 7.5 mg/hr (12/22/14 2120)   Assessment: 81yo female presents with fever and knee swelling. Pharmacy is consulted to dose vancomycin and zosyn for suspected osteomyelitis. Pt is febrile to 101.1, WBC 12.8, sCr 1.6.  Goal of Therapy:  Vancomycin trough level 15-20 mcg/ml  Plan:  Vancomycin 1250mg  IV q24h Zosyn 3.375g IV q8h Measure antibiotic drug levels at steady state Follow up culture results, renal function, and clinical course  Andrey Cota. Diona Foley, PharmD Clinical Pharmacist Pager [phone] 12/22/2014,10:10 PM

## 2014-12-22 NOTE — ED Notes (Signed)
One set of blood cultures drawn and sent to the lab by Carlyn Reichert, RN prior to this RN caring for this patient. This RN called the lab and was informed the set of blood cultures are still there and they will run it; therefore, the first set of blood cultures were drawn prior to antibiotic infusion.

## 2014-12-22 NOTE — ED Notes (Signed)
Patient returned from xray.

## 2014-12-22 NOTE — Progress Notes (Signed)
Report rec'd.  Awaiting patient's arrival.

## 2014-12-22 NOTE — ED Notes (Signed)
Dr Niu at bedside 

## 2014-12-22 NOTE — ED Notes (Signed)
Pt was sent from [place] home for evaluation of left knee. Pt had surgery on the 18th. Staff reported pt had fever and tachycardia. Pt states the left knee is warmer than the right. No drainage noted to wound, remaining wound closed by steri strips. Pt states she had an XR today and the doctor was worried one of the pins may have moved.

## 2014-12-23 ENCOUNTER — Inpatient Hospital Stay (HOSPITAL_COMMUNITY): Payer: Medicare Other

## 2014-12-23 DIAGNOSIS — N179 Acute kidney failure, unspecified: Secondary | ICD-10-CM

## 2014-12-23 DIAGNOSIS — E1121 Type 2 diabetes mellitus with diabetic nephropathy: Secondary | ICD-10-CM

## 2014-12-23 DIAGNOSIS — I4892 Unspecified atrial flutter: Secondary | ICD-10-CM

## 2014-12-23 DIAGNOSIS — M009 Pyogenic arthritis, unspecified: Secondary | ICD-10-CM

## 2014-12-23 DIAGNOSIS — N183 Chronic kidney disease, stage 3 (moderate): Secondary | ICD-10-CM

## 2014-12-23 LAB — CBC
HCT: 23.6 % — ABNORMAL LOW (ref 36.0–46.0)
Hemoglobin: 7.7 g/dL — ABNORMAL LOW (ref 12.0–15.0)
MCH: 28.5 pg (ref 26.0–34.0)
MCHC: 32.6 g/dL (ref 30.0–36.0)
MCV: 87.4 fL (ref 78.0–100.0)
Platelets: 305 10*3/uL (ref 150–400)
RBC: 2.7 MIL/uL — ABNORMAL LOW (ref 3.87–5.11)
RDW: 14.1 % (ref 11.5–15.5)
WBC: 15 10*3/uL — ABNORMAL HIGH (ref 4.0–10.5)

## 2014-12-23 LAB — COMPREHENSIVE METABOLIC PANEL
ALT: 19 U/L (ref 0–35)
AST: 22 U/L (ref 0–37)
Albumin: 1.9 g/dL — ABNORMAL LOW (ref 3.5–5.2)
Alkaline Phosphatase: 70 U/L (ref 39–117)
Anion gap: 9 (ref 5–15)
BUN: 26 mg/dL — ABNORMAL HIGH (ref 6–23)
CO2: 21 mmol/L (ref 19–32)
Calcium: 7.8 mg/dL — ABNORMAL LOW (ref 8.4–10.5)
Chloride: 103 mmol/L (ref 96–112)
Creatinine, Ser: 1.14 mg/dL — ABNORMAL HIGH (ref 0.50–1.10)
GFR calc Af Amer: 50 mL/min — ABNORMAL LOW (ref >90)
GFR calc non Af Amer: 44 mL/min — ABNORMAL LOW (ref >90)
Glucose, Bld: 131 mg/dL — ABNORMAL HIGH (ref 70–99)
Potassium: 3.6 mmol/L (ref 3.5–5.1)
Sodium: 133 mmol/L — ABNORMAL LOW (ref 135–145)
Total Bilirubin: 1.1 mg/dL (ref 0.3–1.2)
Total Protein: 6.7 g/dL (ref 6.0–8.3)

## 2014-12-23 LAB — SYNOVIAL CELL COUNT + DIFF, W/ CRYSTALS
Crystals, Fluid: NONE SEEN
Eosinophils-Synovial: NONE SEEN % (ref 0–1)
Lymphocytes-Synovial Fld: 5 % (ref 0–20)
Monocyte-Macrophage-Synovial Fluid: 9 % — ABNORMAL LOW (ref 50–90)
Neutrophil, Synovial: 86 % — ABNORMAL HIGH (ref 0–25)
WBC, Synovial: 85000 /mm3 — ABNORMAL HIGH (ref 0–200)

## 2014-12-23 LAB — PROCALCITONIN: Procalcitonin: 1.37 ng/mL

## 2014-12-23 LAB — GLUCOSE, CAPILLARY
Glucose-Capillary: 95 mg/dL (ref 70–99)
Glucose-Capillary: 126 mg/dL — ABNORMAL HIGH (ref 70–99)
Glucose-Capillary: 117 mg/dL — ABNORMAL HIGH (ref 70–99)
Glucose-Capillary: 93 mg/dL (ref 70–99)

## 2014-12-23 LAB — TROPONIN I
Troponin I: 0.03 ng/mL (ref <0.031)
Troponin I: 0.04 ng/mL — ABNORMAL HIGH (ref <0.031)
Troponin I: 0.06 ng/mL — ABNORMAL HIGH (ref <0.031)

## 2014-12-23 LAB — ABO/RH: ABO/RH(D): A POS

## 2014-12-23 LAB — LIPID PANEL
Cholesterol: 76 mg/dL (ref 0–200)
HDL: 30 mg/dL — ABNORMAL LOW (ref >39)
LDL Cholesterol: 40 mg/dL (ref 0–99)
Total CHOL/HDL Ratio: 2.5 RATIO
Triglycerides: 30 mg/dL (ref <150)
VLDL: 6 mg/dL (ref 0–40)

## 2014-12-23 LAB — LACTIC ACID, PLASMA
Lactic Acid, Venous: 0.8 mmol/L (ref 0.5–2.0)
Lactic Acid, Venous: 1.3 mmol/L (ref 0.5–2.0)

## 2014-12-23 LAB — CREATININE, URINE, RANDOM: Creatinine, Urine: 114.17 mg/dL

## 2014-12-23 LAB — BRAIN NATRIURETIC PEPTIDE: B Natriuretic Peptide: 154.3 pg/mL — ABNORMAL HIGH (ref 0.0–100.0)

## 2014-12-23 LAB — MRSA PCR SCREENING: MRSA by PCR: POSITIVE — AB

## 2014-12-23 MED ORDER — CHLORHEXIDINE GLUCONATE CLOTH 2 % EX PADS
6.0000 | MEDICATED_PAD | Freq: Every day | CUTANEOUS | Status: AC
Start: 2014-12-23 — End: 2014-12-27
  Administered 2014-12-23 – 2014-12-27 (×5): 6 via TOPICAL

## 2014-12-23 MED ORDER — MUPIROCIN 2 % EX OINT
TOPICAL_OINTMENT | CUTANEOUS | Status: AC
  Administered 2014-12-23: 05:00:00
  Filled 2014-12-23: qty 22

## 2014-12-23 MED ORDER — PERFLUTREN LIPID MICROSPHERE
1.0000 mL | INTRAVENOUS | Status: AC | PRN
Start: 2014-12-23 — End: 2014-12-23
  Administered 2014-12-23: 2 mL via INTRAVENOUS
  Filled 2014-12-23: qty 10

## 2014-12-23 MED ORDER — MUPIROCIN 2 % EX OINT
1.0000 | TOPICAL_OINTMENT | Freq: Two times a day (BID) | CUTANEOUS | Status: AC
Start: 2014-12-23 — End: 2014-12-27
  Administered 2014-12-23 – 2014-12-27 (×10): 1 via NASAL

## 2014-12-23 NOTE — Progress Notes (Addendum)
  Echocardiogram 2D Echocardiogram with Definity has been performed.  Nekeisha Aure 12/23/2014, 12:30 PM

## 2014-12-23 NOTE — Progress Notes (Signed)
[facility] PROGRESS NOTE  Laura Davenport [id number] DOB: 1930-10-21 DOA: 12/22/2014 PCP: [name], MD  Assessment/Plan: sepsis: Appears to be secondary to left knee septic joint  -Knee aspiration has been done and demonstrated a turbid/yellowish fluid with 85,000 WBCs and purulent appearance. -Fluid aspirated has been sent for culture and results are pending at this moment -Will continue broad-spectrum antibiotics -After discussing with orthopedic surgeon plan is to a scope patient left knee and performed washout in the OR on 12/24/2014 - Will continue supportive care, IV fluids, as needed analgesics and antipyretics  Tachycardia: Heart rate 148, indicating possible flutter with 2:1 block. -trop is slightly elevated at 0.04 and 0.06; most likely demand ischemia from ongoing tachycardia -Will follow 2d echo  -Continue cardizem gtt -Continue ASA  AoCKD-III: In the setting of sepsis and continue use of nephrotoxic agents -Will follow renal ultrasound -Continue holding losartan and HCTZ -Will continue IV fluids -BMET in am  HTN: Blood pressure is soft due to sepsis -Continue holding antihypertensive agents  -prn hydralazine -Continue IV fluids  Diabetes mellitus: No A1C on record. Patient is on Lantus 30 units daily  -continue decrease Lantus dose of 20 units -sliding scale insulin  -follow A1c  Hyperlipidemia: Patient is on Lipitor at home. No LDL record. -Continue Lipitor  Code Status: Full code Family Communication: No family at bedside Disposition Plan: Remains in a stepdown; patient is still on Cardizem drip   Consultants:  Edmonia Lynch (orthopedic service_  Procedures:  See below for x-ray reports  Antibiotics:  Vancomycin and cefepime  HPI/Subjective: Positive low-grade fever; still with exquisite pain on her left knee and tachycardic requiring the use of Cardizem drip.  Objective: Filed Vitals:   12/23/14 1813  BP: 111/52  Pulse: 90   Temp: 98 F (36.7 C)  Resp: 20    Intake/Output Summary (Last 24 hours) at 12/23/14 1935 Last data filed at 12/23/14 0520  Gross per 24 hour  Intake 1430.15 ml  Output    300 ml  Net 1130.15 ml   Filed Weights   12/22/14 2123 12/22/14 2200  Weight: 88.6 kg (195 lb 5.2 oz) 88.6 kg (195 lb 5.2 oz)    Exam:   General:  Warm to touch, still complaining of significant pain on her left knee and with sensations of palpitation. Denies shortness of breath, nausea, vomiting or any other complaints.  Cardiovascular: Tachycardic, positive murmur, no rubs, no gallops  Respiratory: Good air movement, no wheezing, no crackles  Abdomen: Soft, nontender, nondistended, positive bowel sounds  Musculoskeletal: Left leg with Bledsoe immobilizer in place; warm and painful knee to touch; no drainage or erythema appreciated.  Data Reviewed: Basic Metabolic Panel:  Recent Labs Lab 12/22/14 1742 12/23/14 0310  NA 129* 133*  K 4.3 3.6  CL 94* 103  CO2 25 21  GLUCOSE 225* 131*  BUN 30* 26*  CREATININE 1.59* 1.14*  CALCIUM 8.4 7.8*   Liver Function Tests:  Recent Labs Lab 12/23/14 0310  AST 22  ALT 19  ALKPHOS 70  BILITOT 1.1  PROT 6.7  ALBUMIN 1.9*   CBC:  Recent Labs Lab 12/19/14 0825 12/22/14 1742 12/23/14 0310  WBC 6.3 12.8* 15.0*  NEUTROABS 4.2 11.2*  --   HGB 9.2* 9.1* 7.7*  HCT 27.5* 26.6* 23.6*  MCV 92.9 89.3 87.4  PLT 322 354 305   Cardiac Enzymes:  Recent Labs Lab 12/22/14 2237 12/23/14 0310 12/23/14 1219  TROPONINI 0.03 0.04* 0.06*   BNP (last 3 results)  Recent  Labs  12/23/14 0310  BNP 154.3*   CBG:  Recent Labs Lab 12/22/14 2225 12/23/14 0858 12/23/14 1224 12/23/14 1622  GLUCAP 144* 95 126* 117*    Recent Results (from the past 240 hour(s))  MRSA PCR Screening     Status: Abnormal   Collection Time: 12/22/14  9:51 PM  Result Value Ref Range Status   MRSA by PCR POSITIVE (A) NEGATIVE Final    Comment:        The GeneXpert MRSA  Assay (FDA approved for NASAL specimens only), is one component of a comprehensive MRSA colonization surveillance program. It is not intended to diagnose MRSA infection nor to guide or monitor treatment for MRSA infections. RESULT CALLED TO, READ BACK BY AND VERIFIED WITH: ABDUSSALAAM,K RN [phone] AT 0330 SKEEN,P   Culture, blood (x 2)     Status: None (Preliminary result)   Collection Time: 12/22/14 10:37 PM  Result Value Ref Range Status   Specimen Description BLOOD LEFT ANTECUBITAL  Final   Special Requests BOTTLES DRAWN AEROBIC ONLY 10CC  Final   Culture PENDING  Incomplete   Report Status PENDING  Incomplete     Studies: Dg Chest 2 View  12/22/2014   CLINICAL DATA:  Fever and tachycardia today.  EXAM: CHEST  2 VIEW  COMPARISON:  12/04/2014  FINDINGS: The heart is enlarged but stable. Stable tortuosity and calcification of the thoracic aorta. The lungs are clear. No infiltrates or effusions. The bony thorax is intact.  IMPRESSION: Stable cardiac enlargement.  No acute pulmonary findings.   Electronically Signed   By: Marijo Sanes M.D.   On: 12/22/2014 18:59   US Renal  12/23/2014   CLINICAL DATA:  Patient with acute renal failure.  EXAM: RENAL/URINARY TRACT ULTRASOUND COMPLETE  COMPARISON:  CT abdomen pelvis 11/01/2012  FINDINGS: Right Kidney:  Length: 10.0 cm. No hydronephrosis. There is a 1.7 x 1.2 x 1.6 cm hypoechoic lesion within the right kidney, potentially representing a small cyst. The renal cortex is increased in echogenicity.  Left Kidney:  Length: 11.1 cm.  No hydronephrosis.  No definite renal mass.  Bladder:  Appears normal for degree of bladder distention.  IMPRESSION: No hydronephrosis.  Slightly increased renal cortical echogenicity suggestive of chronic renal disease.   Electronically Signed   By: Lovey Newcomer M.D.   On: 12/23/2014 08:27   Dg Knee Complete 4 Views Left  12/22/2014   CLINICAL DATA:  Status post fall 6 weeks ago with subsequent repair right patellar  fracture. Now fever, pain and swelling about the left knee for 2 days.  EXAM: LEFT KNEE - COMPLETE 4+ VIEW  COMPARISON:  Two views left knee 12/04/2014 and 11/17/2014.  FINDINGS: 2 pins and a tension band are identified for fixation of a patellar fracture. The inferior fracture fragment is now distracted 1.6 cm compared to anatomic position and alignment on the 11/17/2014 study. The inferiorly distracted fracture fragment is fragmented and the bone is rarefied. New lucency is seen about fixation pins in the patella. Image bones otherwise appear normal. No joint effusion is identified.  IMPRESSION: The inferior pole of patella fracture fragment is now distracted and fragmented highly suspicious for osteomyelitis with associated soft tissue swelling identified. New lucency about 2 fixation pins is also seen most consistent with loosening and infection.   Electronically Signed   By: Inge Rise M.D.   On: 12/22/2014 16:34   Dg Fluoro Guide Ndl Plcd/bx/inj/loc  12/23/2014   CLINICAL DATA:  History of left patellar  fracture with subsequent internal fixation 11/17/2014. Now with left knee pain, swelling and erythema.  EXAM: LEFT KNEE ASPIRATION UNDER FLUOROSCOPY  FLUOROSCOPY TIME:  Radiation Exposure Index (as provided by the fluoroscopic device):  If the device does not provide the exposure index:  Fluoroscopy Time (in minutes and seconds):  18 seconds  Number of Acquired Images:  None.  PROCEDURE: The risks and benefits of the procedure were discussed with the patient and written consent was obtained. Overlying skin prepped with Betadine, draped in the usual sterile fashion and infiltrated locally with buffered Lidocaine. An 18 gauge hypodermic needle was used access the right knee joint. Aspiration immediately yielded purulent appearing joint fluid. A total 7.5 cc was aspirated. The patient tolerated the procedure well without immediate complication.  IMPRESSION: Successful aspiration of the left knee joint  yielding purulent joint fluid. Specimen sent to pathology for testing.   Electronically Signed   By: Inge Rise M.D.   On: 12/23/2014 11:37    Scheduled Meds: . aspirin EC  81 mg Oral Daily  . atorvastatin  80 mg Oral q1800  . Chlorhexidine Gluconate Cloth  6 each Topical Q0600  . feeding supplement (GLUCERNA SHAKE)  237 mL Oral QAC breakfast  . feeding supplement (PRO-STAT SUGAR FREE 64)  30 mL Oral QHS  . heparin  5,000 Units Subcutaneous 3 times per day  . insulin aspart  0-9 Units Subcutaneous TID WC  . insulin glargine  20 Units Subcutaneous QHS  . mupirocin ointment  1 application Nasal BID  . nystatin   Topical BID  . piperacillin-tazobactam (ZOSYN)  IV  3.375 g Intravenous Q8H  . sodium chloride  3 mL Intravenous Q12H  . vancomycin  1,250 mg Intravenous Q24H   Continuous Infusions: . sodium chloride 75 mL/hr (12/22/14 2237)  . diltiazem (CARDIZEM) infusion 7.5 mg/hr (12/23/14 1858)    Principal Problem:   Fever Active Problems:   Diabetes mellitus without complication   Hypertension   Depression   Knee fracture, left   Sepsis   Tachycardia with 141 - 160 beats per minute    Time spent:50 minutes (more than 50% of the time dedicated to face-to-face examination, care coordination and discussion with specialist involved in the case).    Barton Dubois  [facility] Pager [phone]. If 7PM-7AM, please contact night-coverage at [email or web address], password [facility] 12/23/2014, 7:35 PM  LOS: 1 day

## 2014-12-23 NOTE — Progress Notes (Signed)
PT Cancellation Note  Patient Details Name: Laura Davenport MRN: [id number] DOB: May 23, 1931   Cancelled Treatment:    Reason Eval/Treat Not Completed: Patient at procedure or test/unavailable (pt exiting room for xray and not available at this time)   Melford Aase 12/23/2014, 10:11 AM Elwyn Reach, [place]

## 2014-12-23 NOTE — Consult Note (Addendum)
ORTHOPAEDIC CONSULTATION  REQUESTING PHYSICIAN: Ivor Costa, MD  Chief Complaint: left knee pain  HPI: Laura Davenport is a 81 y.o. female who had an ORIF of a left patella fracture by Dr. Case roughly 6 weeks ago at [facility]. She had increased her activity inserted developed pain at the inferior aspect of the knee she saw Dr. case within the last 2 days who felt that her swelling was due to loss of fixation essentially a refracture of the distal patella. He is recommending continued nonoperative treatment of this and locked out in full extension in her knee immobilizer. She was then transferred from her nursing facility to [facility] for evaluation. She denies any systemic symptoms.  She strongly desires to avoid any additional surgery and is asked to be seen by Dr. Case again before any surgery would occur  Past Medical History  Diagnosis Date  . Diabetes mellitus   . Hypertension   . HLD (hyperlipidemia)   . Depression    Past Surgical History  Procedure Laterality Date  . Cholecystectomy N/A 11/04/2012    Procedure: LAPAROSCOPIC CHOLECYSTECTOMY;  Surgeon: Jamesetta So, MD;  Location: [facility];  Service: General;  Laterality: N/A;  . Knee surgery     History   Social History  . Marital Status: Widowed    Spouse Name: N/A  . Number of Children: 58  . Years of Education: N/A   Occupational History  . retired; [employer] History Main Topics  . Smoking status: Never Smoker   . Smokeless tobacco: Not on file  . Alcohol Use: No  . Drug Use: No  . Sexual Activity: Not on file   Other Topics Concern  . None   Social History Narrative   Lives alone   3 daughters & granddaughter nearby   Lost 1 son-strep         History reviewed. No pertinent family history. No Known Allergies Prior to Admission medications   Medication Sig Start Date End Date Taking? Authorizing Provider  acetaminophen (TYLENOL) 325 MG tablet Take 650 mg by mouth every 6 (six) hours as  needed.   Yes Historical Provider, MD  Amino Acids-Protein Hydrolys (FEEDING SUPPLEMENT, PRO-STAT SUGAR FREE 64,) LIQD Take 30 mLs by mouth at bedtime.   Yes Historical Provider, MD  aspirin EC 81 MG tablet Take 81 mg by mouth every morning.    Yes Historical Provider, MD  atorvastatin (LIPITOR) 80 MG tablet Take 80 mg by mouth daily at 6 PM.   Yes Historical Provider, MD  feeding supplement, GLUCERNA SHAKE, (GLUCERNA SHAKE) LIQD Take 237 mLs by mouth daily before breakfast.   Yes Historical Provider, MD  hydrochlorothiazide (HYDRODIURIL) 25 MG tablet Take 25 mg by mouth daily.   Yes [name], MD  insulin glargine (LANTUS) 100 UNIT/ML injection Inject 30 Units into the skin at bedtime.   Yes Historical Provider, MD  losartan (COZAAR) 100 MG tablet Take 100 mg by mouth daily.   Yes Historical Provider, MD  oxyCODONE-acetaminophen (PERCOCET/ROXICET) 5-325 MG per tablet Take 1 tablet by mouth every 4 (four) hours as needed for severe pain.   Yes Historical Provider, MD  rivaroxaban (XARELTO) 10 MG TABS tablet Take 10 mg by mouth daily.   Yes Historical Provider, MD  sertraline (ZOLOFT) 25 MG tablet Take 25 mg by mouth daily.   Yes Historical Provider, MD   Dg Chest 2 View  12/22/2014   CLINICAL DATA:  Fever and tachycardia today.  EXAM: CHEST  2 VIEW  COMPARISON:  12/04/2014  FINDINGS: The heart is enlarged but stable. Stable tortuosity and calcification of the thoracic aorta. The lungs are clear. No infiltrates or effusions. The bony thorax is intact.  IMPRESSION: Stable cardiac enlargement.  No acute pulmonary findings.   Electronically Signed   By: Marijo Sanes M.D.   On: 12/22/2014 18:59   Dg Knee Complete 4 Views Left  12/22/2014   CLINICAL DATA:  Status post fall 6 weeks ago with subsequent repair right patellar fracture. Now fever, pain and swelling about the left knee for 2 days.  EXAM: LEFT KNEE - COMPLETE 4+ VIEW  COMPARISON:  Two views left knee 12/04/2014 and 11/17/2014.  FINDINGS: 2 pins  and a tension band are identified for fixation of a patellar fracture. The inferior fracture fragment is now distracted 1.6 cm compared to anatomic position and alignment on the 11/17/2014 study. The inferiorly distracted fracture fragment is fragmented and the bone is rarefied. New lucency is seen about fixation pins in the patella. Image bones otherwise appear normal. No joint effusion is identified.  IMPRESSION: The inferior pole of patella fracture fragment is now distracted and fragmented highly suspicious for osteomyelitis with associated soft tissue swelling identified. New lucency about 2 fixation pins is also seen most consistent with loosening and infection.   Electronically Signed   By: Inge Rise M.D.   On: 12/22/2014 16:34    Positive ROS: All other systems have been reviewed and were otherwise negative with the exception of those mentioned in the HPI and as above.  Labs cbc  Recent Labs  12/22/14 1742 12/23/14 0310  WBC 12.8* 15.0*  HGB 9.1* 7.7*  HCT 26.6* 23.6*  PLT 354 305    Labs inflam No results for input(s): CRP in the last 72 hours.  Invalid input(s): ESR  Labs coag  Recent Labs  12/22/14 2237  INR 1.46     Recent Labs  12/22/14 1742 12/23/14 0310  NA 129* 133*  K 4.3 3.6  CL 94* 103  CO2 25 21  GLUCOSE 225* 131*  BUN 30* 26*  CREATININE 1.59* 1.14*  CALCIUM 8.4 7.8*    Physical Exam: Filed Vitals:   12/23/14 0352  BP: 107/52  Pulse: 95  Temp: 98.8 F (37.1 C)  Resp: 23   General: Alert, no acute distress Cardiovascular: No pedal edema Respiratory: No cyanosis, no use of accessory musculature GI: No organomegaly, abdomen is soft and non-tender Skin: No lesions in the area of chief complaint other than those listed below in MSK exam.  Neurologic: Sensation intact distally Psychiatric: Patient is competent for consent with normal mood and affect Lymphatic: No axillary or cervical lymphadenopathy  MUSCULOSKELETAL:  LLE: She has  some swelling and a mild effusion around the distal patella. Her wound is well healed there is no drainage there is no erythema. She has no pain with smaller growth motion of the knee but she does have difficulty with active extension as to be expected. Distally she has gross and sensation intact and good pulses. Other extremities are atraumatic with painless ROM and NVI.  Assessment: Loss of fixation of left patella fracture  Plan: To me her wound appearance and x-ray findings are consistent with loss of fixation and I do not see any signs or symptoms of infection at the knee. She has had a few fevers but to me I don't see any signs that this is coming from the knee.  I spoke on  the phone with Dr. case who saw her within the last 2 days and confirms that this is how her knee has looked for quite a while. He does not he also does not recommend any surgery at this time.  If she is stable I recommend that she be discharged back to her nursing facility with follow-up within the next week with Dr. case if she needs continued inpatient care recommend transfer to [facility] said that he may continue to see her as he has known her and been following her knee for the last 6 weeks I do not see any indication for urgent or emergent surgery.  Bledsoe brace locked in extension full time   *Update, I have spoken with her PCP and he is very concerned as knee may be a source of infection. We will obtain a VIR guided aspiration of fluid at her knee and respond accordingly.    Renette Butters, MD Cell [phone]   12/23/2014 5:43 AM

## 2014-12-23 NOTE — Progress Notes (Signed)
Utilization Review Completed.  

## 2014-12-23 NOTE — Progress Notes (Signed)
Occupational Therapy Evaluation Patient Details Name: Laura Davenport MRN: [id number] DOB: Aug 21, 1931 Today's Date: 12/23/2014    History of Present Illness 81 y.o. female who had an ORIF of a left patella fracture 6 weeks ago,.Admitted due to increaed pain/swelling L knee and to r/o infection. Pt with Bledsoe brace locked out in extension.   Clinical Impression   PTA, pt receiving rehab at the [facility] s/p fall at home. Eval limited due to pt fatigue/pain and need to clarify WBS LLE. Pt with Bledsoe brace locked out in extension. Pt will need to D/C to SNF for continued rehab. Will follow acutely to maximize functional level of independence and facilitate D/C to SNF.     Follow Up Recommendations  SNF;Supervision/Assistance - 24 hour    Equipment Recommendations  Other (comment) (TBA at SNF)    Recommendations for Other Services       Precautions / Restrictions Precautions Precautions: Other (comment) Precaution Comments: Bledsoe brace locked out in full extension Required Braces or Orthoses: Other Brace/Splint (bledsoe brace) Restrictions Weight Bearing Restrictions:  (none noted in chart. Need clarification for WBS)      Mobility Bed Mobility Overal bed mobility: Needs Assistance Bed Mobility: Rolling Rolling: Mod assist         General bed mobility comments: min a to roll L  Transfers                 General transfer comment: declined to participate in transfers. Need clarification on WBS                                                ADL Overall ADL's : Needs assistance/impaired Eating/Feeding: Independent   Grooming: Set up   Upper Body Bathing: Set up   Lower Body Bathing: Moderate assistance;Bed level   Upper Body Dressing : Minimal assistance;Bed level   Lower Body Dressing: Maximal assistance;Bed level               Functional mobility during ADLs:  (bed level only. Pt declined getting to EOB)                        Pertinent Vitals/Pain Pain Assessment: 0-10 Pain Score: 5  Pain Location: LLE Pain Descriptors / Indicators: Aching Pain Intervention(s): Limited activity within patient's tolerance;Monitored during session;Repositioned     Hand Dominance Left   Extremity/Trunk Assessment Upper Extremity Assessment Upper Extremity Assessment: Generalized weakness;RUE deficits/detail RUE Deficits / Details: apparent RTC insufficiency. Unable to lift R shoulder against gravity. pain with movement   Lower Extremity Assessment Lower Extremity Assessment: LLE deficits/detail LLE Deficits / Details: patellar fx       Communication Communication Communication: No difficulties   Cognition Arousal/Alertness: Awake/alert Behavior During Therapy: WFL for tasks assessed/performed Overall Cognitive Status: No family/caregiver present to determine baseline cognitive functioning                     General Comments   Requires encouragement to participate            Home Living Family/patient expects to be discharged to:: Skilled nursing facility  Prior Functioning/Environment Level of Independence: Independent with assistive device(s)        Comments: Prior to fall, lived independently at home    OT Diagnosis: Generalized weakness;Acute pain   OT Problem List: Decreased strength;Decreased range of motion;Decreased activity tolerance;Impaired balance (sitting and/or standing);Decreased knowledge of use of DME or AE;Decreased knowledge of precautions;Obesity;Impaired UE functional use;Pain;Increased edema   OT Treatment/Interventions: Self-care/ADL training;Therapeutic exercise;DME and/or AE instruction;Therapeutic activities;Patient/family education;Balance training    OT Goals(Current goals can be found in the care plan section) Acute Rehab OT Goals Patient Stated Goal: to eventually return home OT Goal  Formulation: With patient Time For Goal Achievement: 01/06/15 Potential to Achieve Goals: Good ADL Goals Pt Will Perform Lower Body Bathing: with mod assist;sit to/from stand;with adaptive equipment Pt Will Transfer to Toilet: with mod assist;bedside commode;stand pivot transfer Pt Will Perform Toileting - Clothing Manipulation and hygiene: with min assist;sitting/lateral leans Additional ADL Goal #1: Bed mobility with min A in preparation for ADL  OT Frequency: Min 2X/week   Barriers to D/C:            Co-evaluation              End of Session Nurse Communication: Mobility status;Other (comment) (need for clarification on WBS LLE)  Activity Tolerance: Patient limited by fatigue;Patient limited by pain Patient left: in bed;with call bell/phone within reach;with nursing/sitter in room   Time: [id number] OT Time Calculation (min): 30 min Charges:  OT General Charges $OT Visit: 1 Procedure OT Evaluation $Initial OT Evaluation Tier I: 1 Procedure OT Treatments $Self Care/Home Management : 8-22 mins G-Codes:    Oris Staffieri,HILLARY 2015-01-02, 6:24 PM   [facility], OTR/L  [phone] 2015/01/02

## 2014-12-23 NOTE — Progress Notes (Signed)
OT Cancellation Note  Patient Details Name: Laura Davenport MRN: [id number] DOB: October 25, 1930   Cancelled Treatment:    Reason Eval/Treat Not Completed: Patient at procedure or test/ unavailable (xray)  [place], OTR/L  [phone] 12/23/2014 12/23/2014, 12:45 PM

## 2014-12-23 NOTE — Progress Notes (Signed)
Eldest daughter Veronnica Bisby came by this evening, stated she received a call to come to the hospital. RN updated her on pt. She states that she will be back tomorrow afternoon around 1530.

## 2014-12-23 NOTE — Progress Notes (Signed)
12/23/2014 patient Troponin 1 at 0310 was 0.04 at 1219 it was 0.06. Rn did sent a text to  Dr Dyann Kief at [phone]. First shift Rn did reported to oncoming nurse about labs. [facility] RN.

## 2014-12-24 ENCOUNTER — Encounter (HOSPITAL_COMMUNITY)
Admission: EM | Disposition: A | Discharge status: Skilled Nursing Facility | Payer: Self-pay | Source: Home / Self Care | Attending: Internal Medicine

## 2014-12-24 ENCOUNTER — Encounter (HOSPITAL_COMMUNITY): Payer: Self-pay | Admitting: Certified Registered"

## 2014-12-24 ENCOUNTER — Inpatient Hospital Stay (HOSPITAL_COMMUNITY): Payer: Medicare Other | Admitting: Certified Registered Nurse Anesthetist

## 2014-12-24 HISTORY — PX: I & D EXTREMITY: SHX5045

## 2014-12-24 LAB — HEMOGLOBIN A1C
Hgb A1c MFr Bld: 6.4 % — ABNORMAL HIGH (ref 4.8–5.6)
Mean Plasma Glucose: 137 mg/dL

## 2014-12-24 LAB — GLUCOSE, CAPILLARY
Glucose-Capillary: 87 mg/dL (ref 70–99)
Glucose-Capillary: 89 mg/dL (ref 70–99)
Glucose-Capillary: 179 mg/dL — ABNORMAL HIGH (ref 70–99)
Glucose-Capillary: 158 mg/dL — ABNORMAL HIGH (ref 70–99)
Glucose-Capillary: 159 mg/dL — ABNORMAL HIGH (ref 70–99)

## 2014-12-24 LAB — BASIC METABOLIC PANEL
Anion gap: 9 (ref 5–15)
BUN: 17 mg/dL (ref 6–23)
CO2: 22 mmol/L (ref 19–32)
Calcium: 7.8 mg/dL — ABNORMAL LOW (ref 8.4–10.5)
Chloride: 103 mmol/L (ref 96–112)
Creatinine, Ser: 1.05 mg/dL (ref 0.50–1.10)
GFR calc Af Amer: 56 mL/min — ABNORMAL LOW (ref >90)
GFR calc non Af Amer: 48 mL/min — ABNORMAL LOW (ref >90)
Glucose, Bld: 93 mg/dL (ref 70–99)
Potassium: 3.2 mmol/L — ABNORMAL LOW (ref 3.5–5.1)
Sodium: 134 mmol/L — ABNORMAL LOW (ref 135–145)

## 2014-12-24 LAB — CBC
HCT: 23 % — ABNORMAL LOW (ref 36.0–46.0)
Hemoglobin: 7.5 g/dL — ABNORMAL LOW (ref 12.0–15.0)
MCH: 28.4 pg (ref 26.0–34.0)
MCHC: 32.6 g/dL (ref 30.0–36.0)
MCV: 87.1 fL (ref 78.0–100.0)
Platelets: 286 10*3/uL (ref 150–400)
RBC: 2.64 MIL/uL — ABNORMAL LOW (ref 3.87–5.11)
RDW: 14.3 % (ref 11.5–15.5)
WBC: 10.3 10*3/uL (ref 4.0–10.5)

## 2014-12-24 LAB — PREPARE RBC (CROSSMATCH)

## 2014-12-24 LAB — UREA NITROGEN, URINE: Urea Nitrogen, Ur: 709 mg/dL

## 2014-12-24 SURGERY — IRRIGATION AND DEBRIDEMENT EXTREMITY
Anesthesia: General | Laterality: Left

## 2014-12-24 MED ORDER — OXYCODONE HCL 5 MG/5ML PO SOLN: 5.0000 mg | Freq: Once | ORAL | Status: DC | PRN

## 2014-12-24 MED ORDER — DILTIAZEM HCL 60 MG PO TABS
60.0000 mg | ORAL_TABLET | Freq: Three times a day (TID) | ORAL | Status: DC
  Administered 2014-12-24 – 2014-12-25 (×3): 60 mg via ORAL
  Filled 2014-12-24 (×6): qty 1

## 2014-12-24 MED ORDER — FENTANYL CITRATE (PF) 250 MCG/5ML IJ SOLN
INTRAMUSCULAR | Status: AC
  Filled 2014-12-24: qty 5

## 2014-12-24 MED ORDER — CEFAZOLIN SODIUM-DEXTROSE 2-3 GM-% IV SOLR
2.0000 g | Freq: Once | INTRAVENOUS | Status: AC
  Administered 2014-12-24: 2 g via INTRAVENOUS

## 2014-12-24 MED ORDER — LIDOCAINE HCL (CARDIAC) 20 MG/ML IV SOLN
INTRAVENOUS | Status: DC | PRN
  Administered 2014-12-24: 75 mg via INTRAVENOUS

## 2014-12-24 MED ORDER — KETOROLAC TROMETHAMINE 30 MG/ML IJ SOLN: 30.0000 mg | Freq: Once | INTRAMUSCULAR | Status: DC | PRN

## 2014-12-24 MED ORDER — OXYCODONE HCL 5 MG PO TABS: 5.0000 mg | ORAL_TABLET | Freq: Once | ORAL | Status: DC | PRN

## 2014-12-24 MED ORDER — MAGNESIUM SULFATE 2 GM/50ML IV SOLN
2.0000 g | Freq: Once | INTRAVENOUS | Status: AC
  Administered 2014-12-24: 2 g via INTRAVENOUS
  Filled 2014-12-24 (×2): qty 50

## 2014-12-24 MED ORDER — IBUPROFEN 100 MG/5ML PO SUSP: 200.0000 mg | Freq: Four times a day (QID) | ORAL | Status: DC | PRN

## 2014-12-24 MED ORDER — SODIUM CHLORIDE 0.9 % IV SOLN: 10.0000 mL/h | Freq: Once | INTRAVENOUS | Status: DC

## 2014-12-24 MED ORDER — ONDANSETRON HCL 4 MG/2ML IJ SOLN
INTRAMUSCULAR | Status: DC | PRN
Start: 2014-12-24 — End: 2014-12-24
  Administered 2014-12-24: 4 mg via INTRAVENOUS

## 2014-12-24 MED ORDER — PROPOFOL 10 MG/ML IV BOLUS
INTRAVENOUS | Status: DC | PRN
  Administered 2014-12-24: 100 mg via INTRAVENOUS

## 2014-12-24 MED ORDER — HYDROMORPHONE HCL 1 MG/ML IJ SOLN: 0.2500 mg | INTRAMUSCULAR | Status: DC | PRN

## 2014-12-24 MED ORDER — MEPERIDINE HCL 25 MG/ML IJ SOLN: 6.2500 mg | INTRAMUSCULAR | Status: DC | PRN

## 2014-12-24 MED ORDER — PHENYLEPHRINE HCL 10 MG/ML IJ SOLN
INTRAMUSCULAR | Status: DC | PRN
  Administered 2014-12-24 (×2): 120 ug via INTRAVENOUS

## 2014-12-24 MED ORDER — SODIUM CHLORIDE 0.9 % IJ SOLN
INTRAMUSCULAR | Status: AC
  Filled 2014-12-24: qty 10

## 2014-12-24 MED ORDER — LIDOCAINE HCL (CARDIAC) 20 MG/ML IV SOLN
INTRAVENOUS | Status: AC
  Filled 2014-12-24: qty 5

## 2014-12-24 MED ORDER — EPHEDRINE SULFATE 50 MG/ML IJ SOLN
INTRAMUSCULAR | Status: AC
  Filled 2014-12-24: qty 1

## 2014-12-24 MED ORDER — OXYCODONE-ACETAMINOPHEN 5-325 MG PO TABS: 1.0000 | ORAL_TABLET | ORAL | Status: DC | PRN

## 2014-12-24 MED ORDER — PHENYLEPHRINE 40 MCG/ML (10ML) SYRINGE FOR IV PUSH (FOR BLOOD PRESSURE SUPPORT)
PREFILLED_SYRINGE | INTRAVENOUS | Status: AC
  Filled 2014-12-24: qty 10

## 2014-12-24 MED ORDER — IBUPROFEN 200 MG PO TABS: 200.0000 mg | ORAL_TABLET | Freq: Four times a day (QID) | ORAL | Status: DC | PRN

## 2014-12-24 MED ORDER — SUCCINYLCHOLINE CHLORIDE 20 MG/ML IJ SOLN
INTRAMUSCULAR | Status: AC
  Filled 2014-12-24: qty 1

## 2014-12-24 MED ORDER — BUPIVACAINE-EPINEPHRINE (PF) 0.5% -1:200000 IJ SOLN
INTRAMUSCULAR | Status: AC
  Filled 2014-12-24: qty 30

## 2014-12-24 MED ORDER — ARTIFICIAL TEARS OP OINT
TOPICAL_OINTMENT | OPHTHALMIC | Status: AC
  Filled 2014-12-24: qty 3.5

## 2014-12-24 MED ORDER — CEFAZOLIN SODIUM-DEXTROSE 2-3 GM-% IV SOLR
INTRAVENOUS | Status: AC
  Filled 2014-12-24: qty 50

## 2014-12-24 MED ORDER — ONDANSETRON HCL 4 MG/2ML IJ SOLN
INTRAMUSCULAR | Status: AC
  Filled 2014-12-24: qty 2

## 2014-12-24 MED ORDER — PROPOFOL 10 MG/ML IV BOLUS
INTRAVENOUS | Status: AC
  Filled 2014-12-24: qty 20

## 2014-12-24 MED ORDER — SODIUM CHLORIDE 0.9 % IR SOLN
Status: DC | PRN
  Administered 2014-12-24: 3000 mL

## 2014-12-24 MED ORDER — POTASSIUM CHLORIDE CRYS ER 20 MEQ PO TBCR
40.0000 meq | EXTENDED_RELEASE_TABLET | ORAL | Status: AC
  Administered 2014-12-24 – 2014-12-25 (×3): 40 meq via ORAL
  Filled 2014-12-24 (×3): qty 2

## 2014-12-24 MED ORDER — FENTANYL CITRATE (PF) 100 MCG/2ML IJ SOLN
INTRAMUSCULAR | Status: DC | PRN
  Administered 2014-12-24: 100 ug via INTRAVENOUS

## 2014-12-24 SURGICAL SUPPLY — 68 items
BANDAGE ELASTIC 4 VELCRO ST LF (GAUZE/BANDAGES/DRESSINGS) ×1 IMPLANT
BANDAGE ELASTIC 6 VELCRO ST LF (GAUZE/BANDAGES/DRESSINGS) ×3 IMPLANT
BANDAGE ESMARK 6X9 LF (GAUZE/BANDAGES/DRESSINGS) IMPLANT
BLADE SURG 10 STRL SS (BLADE) ×3 IMPLANT
BLADE SURG ROTATE 9660 (MISCELLANEOUS) IMPLANT
BNDG CMPR 9X6 STRL LF SNTH (GAUZE/BANDAGES/DRESSINGS)
BNDG COHESIVE 4X5 TAN STRL (GAUZE/BANDAGES/DRESSINGS) ×3 IMPLANT
BNDG ESMARK 6X9 LF (GAUZE/BANDAGES/DRESSINGS)
BNDG GAUZE ELAST 4 BULKY (GAUZE/BANDAGES/DRESSINGS) ×1 IMPLANT
BOOTCOVER CLEANROOM LRG (PROTECTIVE WEAR) ×6 IMPLANT
CANISTER WOUND CARE 500ML ATS (WOUND CARE) ×2 IMPLANT
COVER SURGICAL LIGHT HANDLE (MISCELLANEOUS) ×3 IMPLANT
CUFF TOURNIQUET SINGLE 34IN LL (TOURNIQUET CUFF) IMPLANT
CUTTER MENISCUS 3.5MM 6/BX (BLADE) ×3 IMPLANT
DRAPE ARTHROSCOPY W/POUCH 114 (DRAPES) ×3 IMPLANT
DRAPE INCISE IOBAN 66X45 STRL (DRAPES) ×2 IMPLANT
DRAPE U-SHAPE 47X51 STRL (DRAPES) ×3 IMPLANT
DRSG EMULSION OIL 3X3 NADH (GAUZE/BANDAGES/DRESSINGS) ×2 IMPLANT
DRSG PAD ABDOMINAL 8X10 ST (GAUZE/BANDAGES/DRESSINGS) IMPLANT
DRSG VAC ATS SM SENSATRAC (GAUZE/BANDAGES/DRESSINGS) ×2 IMPLANT
DURAPREP 26ML APPLICATOR (WOUND CARE) ×3 IMPLANT
ELECT REM PT RETURN 9FT ADLT (ELECTROSURGICAL)
ELECTRODE REM PT RTRN 9FT ADLT (ELECTROSURGICAL) IMPLANT
EVACUATOR 1/8 PVC DRAIN (DRAIN) IMPLANT
FACESHIELD WRAPAROUND (MASK) IMPLANT
FACESHIELD WRAPAROUND OR TEAM (MASK) ×3 IMPLANT
GAUZE SPONGE 4X4 12PLY STRL (GAUZE/BANDAGES/DRESSINGS) ×1 IMPLANT
GAUZE XEROFORM 1X8 LF (GAUZE/BANDAGES/DRESSINGS) ×1 IMPLANT
GLOVE BIO SURGEON STRL SZ7 (GLOVE) ×3 IMPLANT
GLOVE BIO SURGEON STRL SZ7.5 (GLOVE) ×3 IMPLANT
GLOVE BIO SURGEON STRL SZ8 (GLOVE) ×3 IMPLANT
GLOVE BIOGEL PI IND STRL 7.0 (GLOVE) ×2 IMPLANT
GLOVE BIOGEL PI IND STRL 8 (GLOVE) ×2 IMPLANT
GLOVE BIOGEL PI INDICATOR 7.0 (GLOVE) ×1
GLOVE BIOGEL PI INDICATOR 8 (GLOVE) ×1
GLOVE ORTHO TXT STRL SZ7.5 (GLOVE) ×6 IMPLANT
GOWN STRL REUS W/ TWL LRG LVL3 (GOWN DISPOSABLE) ×5 IMPLANT
GOWN STRL REUS W/ TWL XL LVL3 (GOWN DISPOSABLE) ×1 IMPLANT
GOWN STRL REUS W/TWL 2XL LVL3 (GOWN DISPOSABLE) ×3 IMPLANT
GOWN STRL REUS W/TWL LRG LVL3 (GOWN DISPOSABLE) ×6
GOWN STRL REUS W/TWL XL LVL3 (GOWN DISPOSABLE)
HANDPIECE INTERPULSE COAX TIP (DISPOSABLE)
KIT BASIN OR (CUSTOM PROCEDURE TRAY) ×3 IMPLANT
KIT ROOM TURNOVER OR (KITS) ×3 IMPLANT
MANIFOLD NEPTUNE II (INSTRUMENTS) ×3 IMPLANT
NS IRRIG 1000ML POUR BTL (IV SOLUTION) ×3 IMPLANT
PACK ARTHROSCOPY DSU (CUSTOM PROCEDURE TRAY) ×3 IMPLANT
PACK ORTHO EXTREMITY (CUSTOM PROCEDURE TRAY) ×1 IMPLANT
PAD ARMBOARD 7.5X6 YLW CONV (MISCELLANEOUS) ×6 IMPLANT
PADDING CAST COTTON 6X4 STRL (CAST SUPPLIES) IMPLANT
PENCIL BUTTON HOLSTER BLD 10FT (ELECTRODE) IMPLANT
SET ARTHROSCOPY TUBING (MISCELLANEOUS) ×3
SET ARTHROSCOPY TUBING LN (MISCELLANEOUS) ×2 IMPLANT
SET HNDPC FAN SPRY TIP SCT (DISPOSABLE) IMPLANT
SET IRRIG Y TYPE TUR BLADDER L (SET/KITS/TRAYS/PACK) ×2 IMPLANT
SPONGE LAP 18X18 X RAY DECT (DISPOSABLE) ×3 IMPLANT
SPONGE LAP 4X18 X RAY DECT (DISPOSABLE) ×3 IMPLANT
STOCKINETTE IMPERVIOUS 9X36 MD (GAUZE/BANDAGES/DRESSINGS) ×3 IMPLANT
SUT ETHILON 2 0 FS 18 (SUTURE) IMPLANT
SUT ETHILON 3 0 PS 1 (SUTURE) ×4 IMPLANT
TOWEL OR 17X24 6PK STRL BLUE (TOWEL DISPOSABLE) ×3 IMPLANT
TOWEL OR 17X26 10 PK STRL BLUE (TOWEL DISPOSABLE) ×3 IMPLANT
TOWEL OR NON WOVEN STRL DISP B (DISPOSABLE) ×3 IMPLANT
TUBE ANAEROBIC SPECIMEN COL (MISCELLANEOUS) IMPLANT
TUBE CONNECTING 12X1/4 (SUCTIONS) ×3 IMPLANT
UNDERPAD 30X30 INCONTINENT (UNDERPADS AND DIAPERS) ×3 IMPLANT
WATER STERILE IRR 1000ML POUR (IV SOLUTION) ×3 IMPLANT
YANKAUER SUCT BULB TIP NO VENT (SUCTIONS) ×3 IMPLANT

## 2014-12-24 NOTE — Anesthesia Procedure Notes (Signed)
Procedure Name: LMA Insertion Date/Time: 12/24/2014 7:44 AM Performed by: Sampson Si E Pre-anesthesia Checklist: Patient identified, Emergency Drugs available, Suction available, Patient being monitored and Timeout performed Patient Re-evaluated:Patient Re-evaluated prior to inductionOxygen Delivery Method: Circle system utilized Preoxygenation: Pre-oxygenation with 100% oxygen Intubation Type: IV induction Ventilation: Mask ventilation without difficulty LMA Size: 4.0 Number of attempts: 1 Placement Confirmation: positive ETCO2 and breath sounds checked- equal and bilateral Tube secured with: Tape Dental Injury: Teeth and Oropharynx as per pre-operative assessment

## 2014-12-24 NOTE — Anesthesia Preprocedure Evaluation (Addendum)
Anesthesia Evaluation  Patient identified by MRN, date of birth, ID band Patient awake    Reviewed: Allergy & Precautions, NPO status , Patient's Chart, lab work & pertinent test results  Airway Mallampati: I  TM Distance: >3 FB Neck ROM: Full    Dental  (+) Edentulous Upper, Edentulous Lower, Dental Advisory Given, Upper Dentures   Pulmonary  breath sounds clear to auscultation        Cardiovascular hypertension, Pt. on medications Rhythm:Regular Rate:Normal     Neuro/Psych    GI/Hepatic   Endo/Other  diabetes, Well Controlled, Type 2Morbid obesity  Renal/GU      Musculoskeletal   Abdominal   Peds  Hematology   Anesthesia Other Findings   Reproductive/Obstetrics                           Anesthesia Physical Anesthesia Plan  ASA: III  Anesthesia Plan: General   Post-op Pain Management:    Induction: Intravenous  Airway Management Planned: LMA  Additional Equipment:   Intra-op Plan:   Post-operative Plan: Extubation in OR  Informed Consent: I have reviewed the patients History and Physical, chart, labs and discussed the procedure including the risks, benefits and alternatives for the proposed anesthesia with the patient or authorized representative who has indicated his/her understanding and acceptance.   Dental advisory given  Plan Discussed with: CRNA, Anesthesiologist and Surgeon  Anesthesia Plan Comments:         Anesthesia Quick Evaluation

## 2014-12-24 NOTE — Progress Notes (Signed)
[facility] PROGRESS NOTE  KOLLEEN DURRETTE [id number] DOB: Oct 15, 1930 DOA: 12/22/2014 PCP: [name], MD  Assessment/Plan: sepsis: Appears to be secondary to left knee septic joint  -Fluid aspirated has been sent for culture and results suggesting possible staph aureus -Will continue vancomycin as per infectious disease recommendations -Taken to the OR on 12/24/2014; has received arthroscopy and knee washout with hardware removal - Will continue supportive care, IV fluids, as needed analgesics and antipyretics -We'll follow ID recommendations regarding which antibiotic, for how long and route of administration based on culture results.  Tachycardia: Heart rate 148, indicating possible flutter with 2:1 block. -trop is slightly elevated at 0.04 and 0.06; most likely demand ischemia from ongoing tachycardia -Will follow 2d echo  -Continue cardizem gtt; will attempt to transition to oral Cardizem -Continue ASA  AoCKD-III: In the setting of sepsis and continue use of nephrotoxic agents -Renal ultrasound without hydronephrosis or obstruction -Continue holding losartan and HCTZ -Will continue IV fluids, but will adjust rate -Creatinine 1.05 -We'll continue monitoring  HTN: Blood pressure is much better -Will continue holding antihypertensive drugs except for Cardizem drip that she is on to control tachycardia/flutter -Will attempt to try transition to by mouth Cardizem -Continue prn hydralazine -Continue IV fluids; but will changed rate to 50 mL/h  Diabetes mellitus: No A1C on record. Patient is on Lantus 30 units daily  -continue decrease Lantus dose of 20 units -sliding scale insulin  -A1c 6.4   Hyperlipidemia: Patient is on Lipitor at home. No LDL record. -Continue Lipitor  Code Status: Full code Family Communication: No family at bedside Disposition Plan: Remains in a stepdown; will titrate off Cardizem drip and assess patient post surgery before transferring her  out of the unit in am, if remains stable.   Consultants:  Edmonia Lynch (orthopedic service)  Scharlene Gloss (infectious disease)  Procedures:  See below for x-ray reports  Antibiotics:  Vancomycin 4/22  Cefepime 4/22>>4/23  HPI/Subjective: Afebrile, no chest pain or shortness of breath. Rate is now controlled. Patient is status post surgical intervention for arthroscopy and knee washout with removal of hardware  Objective: Filed Vitals:   12/24/14 1247  BP: 115/57  Pulse: 93  Temp: 98.2 F (36.8 C)  Resp: 18    Intake/Output Summary (Last 24 hours) at 12/24/14 1427 Last data filed at 12/24/14 1000  Gross per 24 hour  Intake   2998 ml  Output      0 ml  Net   2998 ml   Filed Weights   12/22/14 2123 12/22/14 2200  Weight: 88.6 kg (195 lb 5.2 oz) 88.6 kg (195 lb 5.2 oz)    Exam:  General:  Low-grade fever, no chest pain, no shortness of breath. Status post left knee arthroscopy and washout with hardware removal. Cardiovascular: Regular rate, positive murmur, no rubs, no gallops Respiratory: Good air movement, no wheezing, no crackles Abdomen: Soft, nontender, nondistended, positive bowel sounds Musculoskeletal: Left leg with dressing in place. Per patient slightly less pain  Data Reviewed: Basic Metabolic Panel:  Recent Labs Lab 12/22/14 1742 12/23/14 0310 12/24/14 0255  NA 129* 133* 134*  K 4.3 3.6 3.2*  CL 94* 103 103  CO2 25 21 22   GLUCOSE 225* 131* 93  BUN 30* 26* 17  CREATININE 1.59* 1.14* 1.05  CALCIUM 8.4 7.8* 7.8*   Liver Function Tests:  Recent Labs Lab 12/23/14 0310  AST 22  ALT 19  ALKPHOS 70  BILITOT 1.1  PROT 6.7  ALBUMIN 1.9*   CBC:  Recent Labs Lab 12/19/14 0825 12/22/14 1742 12/23/14 0310 12/24/14 0255  WBC 6.3 12.8* 15.0* 10.3  NEUTROABS 4.2 11.2*  --   --   HGB 9.2* 9.1* 7.7* 7.5*  HCT 27.5* 26.6* 23.6* 23.0*  MCV 92.9 89.3 87.4 87.1  PLT 322 354 305 286   Cardiac Enzymes:  Recent Labs Lab 12/22/14 2237  12/23/14 0310 12/23/14 1219  TROPONINI 0.03 0.04* 0.06*   BNP (last 3 results)  Recent Labs  12/23/14 0310  BNP 154.3*   CBG:  Recent Labs Lab 12/23/14 1622 12/23/14 2121 12/24/14 0639 12/24/14 0905 12/24/14 1246  GLUCAP 117* 93 87 89 179*    Recent Results (from the past 240 hour(s))  Culture, blood (x 2)     Status: None (Preliminary result)   Collection Time: 12/22/14  4:45 PM  Result Value Ref Range Status   Specimen Description BLOOD RIGHT ARM  Final   Special Requests BOTTLES DRAWN AEROBIC AND ANAEROBIC 5CC  Final   Culture   Final    GRAM POSITIVE COCCI IN CLUSTERS Note: Gram Stain Report Called to,Read Back By and Verified With: KARA NARIGON @ 10:03 AM 12/24/14 BY DWEEKS Performed at [facility]    Report Status PENDING  Incomplete  MRSA PCR Screening     Status: Abnormal   Collection Time: 12/22/14  9:51 PM  Result Value Ref Range Status   MRSA by PCR POSITIVE (A) NEGATIVE Final    Comment:        The GeneXpert MRSA Assay (FDA approved for NASAL specimens only), is one component of a comprehensive MRSA colonization surveillance program. It is not intended to diagnose MRSA infection nor to guide or monitor treatment for MRSA infections. RESULT CALLED TO, READ BACK BY AND VERIFIED WITH: ABDUSSALAAM,K RN [phone] AT 0330 SKEEN,P   Culture, blood (x 2)     Status: None (Preliminary result)   Collection Time: 12/22/14 10:37 PM  Result Value Ref Range Status   Specimen Description BLOOD LEFT ANTECUBITAL  Final   Special Requests BOTTLES DRAWN AEROBIC ONLY 10CC  Final   Culture   Final           BLOOD CULTURE RECEIVED NO GROWTH TO DATE CULTURE WILL BE HELD FOR 5 DAYS BEFORE ISSUING A FINAL NEGATIVE REPORT Performed at [facility]    Report Status PENDING  Incomplete  Body fluid culture     Status: None (Preliminary result)   Collection Time: 12/23/14 11:11 AM  Result Value Ref Range Status   Specimen Description FLUID SYNOVIAL KNEE  LEFT  Final   Special Requests NONE  Final   Gram Stain   Final    ABUNDANT WBC PRESENT, PREDOMINANTLY MONONUCLEAR NO ORGANISMS SEEN Performed at [facility]    Culture PENDING  Incomplete   Report Status PENDING  Incomplete     Studies: Dg Chest 2 View  12/22/2014   CLINICAL DATA:  Fever and tachycardia today.  EXAM: CHEST  2 VIEW  COMPARISON:  12/04/2014  FINDINGS: The heart is enlarged but stable. Stable tortuosity and calcification of the thoracic aorta. The lungs are clear. No infiltrates or effusions. The bony thorax is intact.  IMPRESSION: Stable cardiac enlargement.  No acute pulmonary findings.   Electronically Signed   By: Marijo Sanes M.D.   On: 12/22/2014 18:59   US Renal  12/23/2014   CLINICAL DATA:  Patient with acute renal failure.  EXAM: RENAL/URINARY TRACT ULTRASOUND COMPLETE  COMPARISON:  CT abdomen pelvis 11/01/2012  FINDINGS: Right Kidney:  Length: 10.0 cm. No hydronephrosis. There is a 1.7 x 1.2 x 1.6 cm hypoechoic lesion within the right kidney, potentially representing a small cyst. The renal cortex is increased in echogenicity.  Left Kidney:  Length: 11.1 cm.  No hydronephrosis.  No definite renal mass.  Bladder:  Appears normal for degree of bladder distention.  IMPRESSION: No hydronephrosis.  Slightly increased renal cortical echogenicity suggestive of chronic renal disease.   Electronically Signed   By: Lovey Newcomer M.D.   On: 12/23/2014 08:27   Dg Knee Complete 4 Views Left  12/22/2014   CLINICAL DATA:  Status post fall 6 weeks ago with subsequent repair right patellar fracture. Now fever, pain and swelling about the left knee for 2 days.  EXAM: LEFT KNEE - COMPLETE 4+ VIEW  COMPARISON:  Two views left knee 12/04/2014 and 11/17/2014.  FINDINGS: 2 pins and a tension band are identified for fixation of a patellar fracture. The inferior fracture fragment is now distracted 1.6 cm compared to anatomic position and alignment on the 11/17/2014 study. The inferiorly  distracted fracture fragment is fragmented and the bone is rarefied. New lucency is seen about fixation pins in the patella. Image bones otherwise appear normal. No joint effusion is identified.  IMPRESSION: The inferior pole of patella fracture fragment is now distracted and fragmented highly suspicious for osteomyelitis with associated soft tissue swelling identified. New lucency about 2 fixation pins is also seen most consistent with loosening and infection.   Electronically Signed   By: Inge Rise M.D.   On: 12/22/2014 16:34   Dg Fluoro Guide Ndl Plcd/bx/inj/loc  12/23/2014   CLINICAL DATA:  History of left patellar fracture with subsequent internal fixation 11/17/2014. Now with left knee pain, swelling and erythema.  EXAM: LEFT KNEE ASPIRATION UNDER FLUOROSCOPY  FLUOROSCOPY TIME:  Radiation Exposure Index (as provided by the fluoroscopic device):  If the device does not provide the exposure index:  Fluoroscopy Time (in minutes and seconds):  18 seconds  Number of Acquired Images:  None.  PROCEDURE: The risks and benefits of the procedure were discussed with the patient and written consent was obtained. Overlying skin prepped with Betadine, draped in the usual sterile fashion and infiltrated locally with buffered Lidocaine. An 18 gauge hypodermic needle was used access the right knee joint. Aspiration immediately yielded purulent appearing joint fluid. A total 7.5 cc was aspirated. The patient tolerated the procedure well without immediate complication.  IMPRESSION: Successful aspiration of the left knee joint yielding purulent joint fluid. Specimen sent to pathology for testing.   Electronically Signed   By: Inge Rise M.D.   On: 12/23/2014 11:37    Scheduled Meds: . sodium chloride  10 mL/hr Intravenous Once  . aspirin EC  81 mg Oral Daily  . atorvastatin  80 mg Oral q1800  . Chlorhexidine Gluconate Cloth  6 each Topical Q0600  . diltiazem  60 mg Oral 3 times per day  . feeding  supplement (GLUCERNA SHAKE)  237 mL Oral QAC breakfast  . feeding supplement (PRO-STAT SUGAR FREE 64)  30 mL Oral QHS  . heparin  5,000 Units Subcutaneous 3 times per day  . insulin aspart  0-9 Units Subcutaneous TID WC  . insulin glargine  20 Units Subcutaneous QHS  . mupirocin ointment  1 application Nasal BID  . nystatin   Topical BID  . piperacillin-tazobactam (ZOSYN)  IV  3.375 g Intravenous Q8H  . sodium chloride  3 mL Intravenous Q12H  .  vancomycin  1,250 mg Intravenous Q24H   Continuous Infusions: . sodium chloride 75 mL/hr (12/24/14 1048)  . diltiazem (CARDIZEM) infusion 7.5 mg/hr (12/24/14 0735)    Principal Problem:   Fever Active Problems:   Diabetes mellitus without complication   Hypertension   Depression   Knee fracture, left   Sepsis   Tachycardia with 141 - 160 beats per minute    Time spent: 50 minutes (more than 50% of the time dedicated to face-to-face examination, care coordination and discussion with specialist involved in the case).    Barton Dubois  [facility] Pager [phone]. If 7PM-7AM, please contact night-coverage at [email or web address], password [facility] 12/24/2014, 2:27 PM  LOS: 2 days

## 2014-12-24 NOTE — Anesthesia Postprocedure Evaluation (Signed)
  Anesthesia Post-op Note  Patient: Laura Davenport  Procedure(s) Performed: Procedure(s): IRRIGATION AND DEBRIDEMENT EXTREMITY AND ARTHROSCOPY KNEE (Left)  Patient Location: PACU  Anesthesia Type: General   Level of Consciousness: awake, alert  and oriented  Airway and Oxygen Therapy: Patient Spontanous Breathing  Post-op Pain: none  Post-op Assessment: Post-op Vital signs reviewed  Post-op Vital Signs: Reviewed  Last Vitals:  Filed Vitals:   12/24/14 1015  BP: 89/69  Pulse: 84  Temp:   Resp: 12    Complications: No apparent anesthesia complications

## 2014-12-24 NOTE — Consult Note (Signed)
[facility] for [facility]     Reason for Consult: septic arthritis    Referring Physician: Dr. Dyann Kief  Principal Problem:   Fever Active Problems:   Diabetes mellitus without complication   Hypertension   Depression   Knee fracture, left   Sepsis   Tachycardia with 141 - 160 beats per minute   . sodium chloride  10 mL/hr Intravenous Once  . aspirin EC  81 mg Oral Daily  . atorvastatin  80 mg Oral q1800  . Chlorhexidine Gluconate Cloth  6 each Topical Q0600  . diltiazem  60 mg Oral 3 times per day  . feeding supplement (GLUCERNA SHAKE)  237 mL Oral QAC breakfast  . feeding supplement (PRO-STAT SUGAR FREE 64)  30 mL Oral QHS  . heparin  5,000 Units Subcutaneous 3 times per day  . insulin aspart  0-9 Units Subcutaneous TID WC  . insulin glargine  20 Units Subcutaneous QHS  . mupirocin ointment  1 application Nasal BID  . nystatin   Topical BID  . piperacillin-tazobactam (ZOSYN)  IV  3.375 g Intravenous Q8H  . sodium chloride  3 mL Intravenous Q12H  . vancomycin  1,250 mg Intravenous Q24H    Recommendations: Continue with vancomycin pending cultures' I will stop zosyn, doubt Pseudomonas   Assessment: She has septic arthritis after patella fracture and was done at [facility] and was at the [facility].  Now with septic arthritis s/p I and D by Dr. Alain Marion with hardware removal.  Now with 1/2 GPC blood culture of unknown significance.     Antibiotics: Vancomycin and zosyn  HPI: Laura Davenport is a 81 y.o. female with recent fractured patella and surgery with hardware placement at [facility] who was recovering at [facility] and presented here with knee swelling and found to be infected septic arthritis.  Surgery today and removed hardware.  No recent fever or chills.     Review of Systems: A comprehensive review of systems was negative.  Past Medical History  Diagnosis Date  . Diabetes mellitus   . Hypertension   . HLD (hyperlipidemia)   . Depression      History  Substance Use Topics  . Smoking status: Never Smoker   . Smokeless tobacco: Not on file  . Alcohol Use: No    History reviewed. No pertinent family history. No Known Allergies  OBJECTIVE: Blood pressure 105/46, pulse 93, temperature 98.2 F (36.8 C), temperature source Oral, resp. rate 15, height 5\' 3"  (1.6 m), weight 195 lb 5.2 oz (88.6 kg), SpO2 100 %. General: awake, alert, nad Skin: no rashes Lungs: CTA B Cor: RRR Abdomen: soft, nt, nd Ext: no edema in right, left leg wrapped  Microbiology: Recent Results (from the past 240 hour(s))  Culture, blood (x 2)     Status: None (Preliminary result)   Collection Time: 12/22/14  4:45 PM  Result Value Ref Range Status   Specimen Description BLOOD RIGHT ARM  Final   Special Requests BOTTLES DRAWN AEROBIC AND ANAEROBIC 5CC  Final   Culture   Final    GRAM POSITIVE COCCI IN CLUSTERS Note: Gram Stain Report Called to,Read Back By and Verified With: KARA NARIGON @ 10:03 AM 12/24/14 BY DWEEKS Performed at [facility]    Report Status PENDING  Incomplete  MRSA PCR Screening     Status: Abnormal   Collection Time: 12/22/14  9:51 PM  Result Value Ref Range Status   MRSA by PCR POSITIVE (A)  NEGATIVE Final    Comment:        The GeneXpert MRSA Assay (FDA approved for NASAL specimens only), is one component of a comprehensive MRSA colonization surveillance program. It is not intended to diagnose MRSA infection nor to guide or monitor treatment for MRSA infections. RESULT CALLED TO, READ BACK BY AND VERIFIED WITH: ABDUSSALAAM,K RN [phone] AT 0330 SKEEN,P   Culture, blood (x 2)     Status: None (Preliminary result)   Collection Time: 12/22/14 10:37 PM  Result Value Ref Range Status   Specimen Description BLOOD LEFT ANTECUBITAL  Final   Special Requests BOTTLES DRAWN AEROBIC ONLY 10CC  Final   Culture   Final           BLOOD CULTURE RECEIVED NO GROWTH TO DATE CULTURE WILL BE HELD FOR 5 DAYS BEFORE ISSUING A  FINAL NEGATIVE REPORT Performed at [facility]    Report Status PENDING  Incomplete  Body fluid culture     Status: None (Preliminary result)   Collection Time: 12/23/14 11:11 AM  Result Value Ref Range Status   Specimen Description FLUID SYNOVIAL KNEE LEFT  Final   Special Requests NONE  Final   Gram Stain   Final    ABUNDANT WBC PRESENT, PREDOMINANTLY MONONUCLEAR NO ORGANISMS SEEN Performed at [facility]    Culture   Final    Culture reincubated for better growth Performed at [facility]    Report Status PENDING  Incomplete    [place], [place] [facility] [email or web address] [phone] pager  [phone] cell 12/24/2014, 4:02 PM

## 2014-12-24 NOTE — Progress Notes (Signed)
On adm to PACU pt continuing with  Cardizem IV and Blood infusion in progress

## 2014-12-24 NOTE — Op Note (Signed)
12/22/2014 - 12/24/2014  8:40 AM  PATIENT:  Laura Davenport    PRE-OPERATIVE DIAGNOSIS:  left knee infection, patella fracture  POST-OPERATIVE DIAGNOSIS:  Same  PROCEDURE:  IRRIGATION AND DEBRIDEMENT EXTREMITY AND ARTHROSCOPY KNEE  SURGEON:  Khira Cudmore, Ernesta Amble, MD  ASSISTANT: Lovett Calender, PA-C, She was present and scrubbed throughout the case, critical for completion in a timely fashion, and for retraction, instrumentation, and closure.   ANESTHESIA:   gen  PREOPERATIVE INDICATIONS:  Laura Davenport is a  81 y.o. female with a diagnosis of left knee infection, patella fracture who failed conservative measures and elected for surgical management.    The risks benefits and alternatives were discussed with the patient preoperatively including but not limited to the risks of infection, bleeding, nerve injury, cardiopulmonary complications, the need for revision surgery, among others, and the patient was willing to proceed.  OPERATIVE IMPLANTS: none  OPERATIVE FINDINGS: purulent fluid and loss of fixation of the patella, healing of peri tenon had occurred.   BLOOD LOSS: min  COMPLICATIONS: none  TOURNIQUET TIME: 45  OPERATIVE PROCEDURE:  Patient was identified in the preoperative holding area and site was marked by me She was transported to the operating theater and placed on the table in supine position taking care to pad all bony prominences. After a preincinduction time out anesthesia was induced. The left lower extremity was prepped and draped in normal sterile fashion and a pre-incision timeout was performed. She received ancef for preoperative antibiotics.   She had a palpable fluid collection on the center aspect of her patella incision so started by incising the central aspect of the wound immediately significant amount of purulent fluid was expressed from this. This space also communicated with the the knee joint itself. At this point  At this point I elected to forego the  arthroscopic washout and perform an open washout of the knee joint through her loss of reduction of the patella fracture.  I extended the incision proximally and distally and I examined all of her fixation. Her tension band was no longer holding onto the distal pole of the patella.  There was however some intact peritenon fibers on on either side so the patella did move as one but the tension band was floating in space distally. At this point I elected to move all heart remove all hardware in light of the infection and loss of fixation. There were also FiberWire stitches that I removed as well. I removed the K wires and also the cerclage wire.  I then performed a thorough debridement of all devitalized tissue I performed an irrigation with 6 L of normal saline in the knee joint as well as the superficial wound.  I sent the purulent fluid and tissue for culture.  I then loosely closed or skin with a nylon stitch and placed a wound VAC over the incision. She was taking the PACU in stable condition.  POST OPERATIVE PLAN: Knee brace locked in extension full time.  DVT px per primary team, no orthopedic contraindication     This note was generated using a template and dragon dictation system. In light of that, I have reviewed the note and all aspects of it are applicable to this case. Any dictation errors are due to the computerized dictation system.

## 2014-12-24 NOTE — Transfer of Care (Signed)
Immediate Anesthesia Transfer of Care Note  Patient: Laura Davenport  Procedure(s) Performed: Procedure(s): IRRIGATION AND DEBRIDEMENT EXTREMITY AND ARTHROSCOPY KNEE (Left)  Patient Location: PACU  Anesthesia Type:General  Level of Consciousness: awake and alert   Airway & Oxygen Therapy: Patient Spontanous Breathing and Patient connected to nasal cannula oxygen  Post-op Assessment: Report given to RN  Post vital signs: Reviewed and stable  Last Vitals:  Filed Vitals:   12/24/14 0400  BP: 105/48  Pulse: 90  Temp: 37.2 C  Resp: 18    Complications: No apparent anesthesia complications

## 2014-12-25 DIAGNOSIS — IMO0001 Reserved for inherently not codable concepts without codable children: Secondary | ICD-10-CM | POA: Insufficient documentation

## 2014-12-25 DIAGNOSIS — I10 Essential (primary) hypertension: Secondary | ICD-10-CM | POA: Insufficient documentation

## 2014-12-25 DIAGNOSIS — M009 Pyogenic arthritis, unspecified: Secondary | ICD-10-CM | POA: Diagnosis present

## 2014-12-25 LAB — CBC
HCT: 27.9 % — ABNORMAL LOW (ref 36.0–46.0)
Hemoglobin: 9.7 g/dL — ABNORMAL LOW (ref 12.0–15.0)
MCH: 31.9 pg (ref 26.0–34.0)
MCHC: 34.8 g/dL (ref 30.0–36.0)
MCV: 91.8 fL (ref 78.0–100.0)
Platelets: 288 10*3/uL (ref 150–400)
RBC: 3.04 MIL/uL — ABNORMAL LOW (ref 3.87–5.11)
RDW: 15.1 % (ref 11.5–15.5)
WBC: 7.8 10*3/uL (ref 4.0–10.5)

## 2014-12-25 LAB — GLUCOSE, CAPILLARY
Glucose-Capillary: 73 mg/dL (ref 70–99)
Glucose-Capillary: 78 mg/dL (ref 70–99)
Glucose-Capillary: 86 mg/dL (ref 70–99)

## 2014-12-25 LAB — BASIC METABOLIC PANEL
Anion gap: 6 (ref 5–15)
BUN: 18 mg/dL (ref 6–23)
CO2: 22 mmol/L (ref 19–32)
Calcium: 7.9 mg/dL — ABNORMAL LOW (ref 8.4–10.5)
Chloride: 106 mmol/L (ref 96–112)
Creatinine, Ser: 1.04 mg/dL (ref 0.50–1.10)
GFR calc Af Amer: 56 mL/min — ABNORMAL LOW (ref >90)
GFR calc non Af Amer: 49 mL/min — ABNORMAL LOW (ref >90)
Glucose, Bld: 84 mg/dL (ref 70–99)
Potassium: 5.2 mmol/L — ABNORMAL HIGH (ref 3.5–5.1)
Sodium: 134 mmol/L — ABNORMAL LOW (ref 135–145)

## 2014-12-25 MED ORDER — DIPHENHYDRAMINE HCL 25 MG PO CAPS
25.0000 mg | ORAL_CAPSULE | Freq: Once | ORAL | Status: AC
  Administered 2014-12-25: 25 mg via ORAL
  Filled 2014-12-25: qty 1

## 2014-12-25 MED ORDER — DILTIAZEM HCL 60 MG PO TABS
60.0000 mg | ORAL_TABLET | Freq: Two times a day (BID) | ORAL | Status: DC
  Administered 2014-12-25 – 2014-12-27 (×5): 60 mg via ORAL
  Filled 2014-12-25 (×7): qty 1

## 2014-12-25 MED ORDER — OXYCODONE HCL 5 MG PO TABS
5.0000 mg | ORAL_TABLET | ORAL | Status: DC | PRN
  Administered 2014-12-26 – 2014-12-29 (×3): 10 mg via ORAL
  Filled 2014-12-25 (×3): qty 2
  Filled 2014-12-25: qty 1

## 2014-12-25 MED ORDER — HYDROMORPHONE HCL 1 MG/ML IJ SOLN
1.0000 mg | INTRAMUSCULAR | Status: DC | PRN
  Administered 2014-12-27 (×2): 1 mg via INTRAVENOUS
  Filled 2014-12-25 (×2): qty 1

## 2014-12-25 NOTE — Progress Notes (Signed)
ANTIBIOTIC CONSULT NOTE - FOLLOW UP  Pharmacy Consult for Vancomycin Indication: septic arthritis   No Known Allergies  Patient Measurements: Height: 5\' 3"  (160 cm) Weight: 195 lb 5.2 oz (88.6 kg) IBW/kg (Calculated) : 52.4  Vital Signs: Temp: 98.6 F (37 C) (04/24 0931) Temp Source: Oral (04/24 0931) BP: 99/53 mmHg (04/24 0931) Pulse Rate: 81 (04/24 0931) Intake/Output from previous day: 04/23 0701 - 04/24 0700 In: 2243 [I.V.:1273; Blood:670; IV Piggyback:300] Out: 1 [Urine:1] Intake/Output from this shift: Total I/O In: 3 [I.V.:3] Out: -   Labs:  Recent Labs  12/23/14 0310 12/23/14 0536 12/24/14 0255 12/25/14 0615  WBC 15.0*  --  10.3 7.8  HGB 7.7*  --  7.5* 9.7*  PLT 305  --  286 288  LABCREA  --  114.17  --   --   CREATININE 1.14*  --  1.05 1.04   Estimated Creatinine Clearance: 44 mL/min (by C-G formula based on Cr of 1.04). No results for input(s): VANCOTROUGH, VANCOPEAK, VANCORANDOM, GENTTROUGH, GENTPEAK, GENTRANDOM, TOBRATROUGH, TOBRAPEAK, TOBRARND, AMIKACINPEAK, AMIKACINTROU, AMIKACIN in the last 72 hours.   Microbiology: Recent Results (from the past 720 hour(s))  Urine culture     Status: None   Collection Time: 12/04/14 11:05 AM  Result Value Ref Range Status   Specimen Description URINE, CLEAN CATCH  Final   Special Requests NONE  Final   Colony Count   Final    >=100,000 COLONIES/ML Performed at [facility]    Culture   Final    [place] Performed at [facility]    Report Status 12/08/2014 FINAL  Final   Organism ID, Bacteria ESCHERICHIA COLI  Final   Organism ID, Bacteria PROTEUS MIRABILIS  Final      Susceptibility   Escherichia coli - MIC*    AMPICILLIN <=2 SENSITIVE Sensitive     CEFAZOLIN <=4 SENSITIVE Sensitive     CEFTRIAXONE <=1 SENSITIVE Sensitive     CIPROFLOXACIN <=0.25 SENSITIVE Sensitive     GENTAMICIN <=1 SENSITIVE Sensitive     LEVOFLOXACIN <=0.12 SENSITIVE Sensitive    NITROFURANTOIN <=16 SENSITIVE Sensitive     TOBRAMYCIN <=1 SENSITIVE Sensitive     TRIMETH/SULFA <=20 SENSITIVE Sensitive     PIP/TAZO <=4 SENSITIVE Sensitive     * ESCHERICHIA COLI   Proteus mirabilis - MIC*    AMPICILLIN <=2 SENSITIVE Sensitive     CEFAZOLIN <=4 SENSITIVE Sensitive     CEFTRIAXONE <=1 SENSITIVE Sensitive     CIPROFLOXACIN >=4 RESISTANT Resistant     GENTAMICIN <=1 SENSITIVE Sensitive     LEVOFLOXACIN >=8 RESISTANT Resistant     NITROFURANTOIN 128 RESISTANT Resistant     TOBRAMYCIN <=1 SENSITIVE Sensitive     TRIMETH/SULFA <=20 SENSITIVE Sensitive     PIP/TAZO <=4 SENSITIVE Sensitive     * PROTEUS MIRABILIS  Culture, blood (x 2)     Status: None (Preliminary result)   Collection Time: 12/22/14  4:45 PM  Result Value Ref Range Status   Specimen Description BLOOD RIGHT ARM  Final   Special Requests BOTTLES DRAWN AEROBIC AND ANAEROBIC 5CC  Final   Culture   Final    STAPHYLOCOCCUS AUREUS Note: RIFAMPIN AND GENTAMICIN SHOULD NOT BE USED AS SINGLE DRUGS FOR TREATMENT OF STAPH INFECTIONS. Note: Gram Stain Report Called to,Read Back By and Verified With: KARA NARIGON @ 10:03 AM 12/24/14 BY DWEEKS Performed at [facility]    Report Status PENDING  Incomplete  MRSA PCR Screening  Status: Abnormal   Collection Time: 12/22/14  9:51 PM  Result Value Ref Range Status   MRSA by PCR POSITIVE (A) NEGATIVE Final    Comment:        The GeneXpert MRSA Assay (FDA approved for NASAL specimens only), is one component of a comprehensive MRSA colonization surveillance program. It is not intended to diagnose MRSA infection nor to guide or monitor treatment for MRSA infections. RESULT CALLED TO, READ BACK BY AND VERIFIED WITH: ABDUSSALAAM,K RN [phone] AT 0330 SKEEN,P   Culture, blood (x 2)     Status: None (Preliminary result)   Collection Time: 12/22/14 10:37 PM  Result Value Ref Range Status   Specimen Description BLOOD LEFT ANTECUBITAL  Final   Special Requests  BOTTLES DRAWN AEROBIC ONLY 10CC  Final   Culture   Final           BLOOD CULTURE RECEIVED NO GROWTH TO DATE CULTURE WILL BE HELD FOR 5 DAYS BEFORE ISSUING A FINAL NEGATIVE REPORT Performed at [facility]    Report Status PENDING  Incomplete  Anaerobic culture     Status: None (Preliminary result)   Collection Time: 12/23/14 11:11 AM  Result Value Ref Range Status   Specimen Description FLUID SYNOVIAL LEFT KNEE  Final   Special Requests NONE  Final   Gram Stain PENDING  Incomplete   Culture   Final    NO ANAEROBES ISOLATED; CULTURE IN PROGRESS FOR 5 DAYS Performed at [facility]    Report Status PENDING  Incomplete  Body fluid culture     Status: None (Preliminary result)   Collection Time: 12/23/14 11:11 AM  Result Value Ref Range Status   Specimen Description FLUID SYNOVIAL KNEE LEFT  Final   Special Requests NONE  Final   Gram Stain   Final    ABUNDANT WBC PRESENT, PREDOMINANTLY MONONUCLEAR NO ORGANISMS SEEN Performed at [facility]    Culture   Final    Culture reincubated for better growth Performed at [facility]    Report Status PENDING  Incomplete  Anaerobic culture     Status: None (Preliminary result)   Collection Time: 12/24/14  8:20 AM  Result Value Ref Range Status   Specimen Description TISSUE LEFT KNEE  Final   Special Requests PATIENT ON FOLLOWING VANCOMYCIN ZOSYN  Final   Gram Stain PENDING  Incomplete   Culture   Final    NO ANAEROBES ISOLATED; CULTURE IN PROGRESS FOR 5 DAYS Performed at [facility]    Report Status PENDING  Incomplete    Anti-infectives    Start     Dose/Rate Route Frequency Ordered Stop   12/24/14 0815  ceFAZolin (ANCEF) IVPB 2 g/50 mL premix     2 g 100 mL/hr over 30 Minutes Intravenous  Once 12/24/14 0807 12/24/14 0803   12/23/14 0430  piperacillin-tazobactam (ZOSYN) IVPB 3.375 g  Status:  Discontinued     3.375 g 12.5 mL/hr over 240 Minutes Intravenous Every 8 hours 12/22/14 2214  12/24/14 1609   12/22/14 2230  vancomycin (VANCOCIN) 1,250 mg in sodium chloride 0.9 % 250 mL IVPB     1,250 mg 166.7 mL/hr over 90 Minutes Intravenous Every 24 hours 12/22/14 2214     12/22/14 2215  piperacillin-tazobactam (ZOSYN) IVPB 3.375 g     3.375 g 100 mL/hr over 30 Minutes Intravenous  Once 12/22/14 2206 12/22/14 2313   12/22/14 2000  ceFAZolin (ANCEF) IVPB 2 g/50 mL premix  2 g 100 mL/hr over 30 Minutes Intravenous  Once 12/22/14 1946 12/22/14 2120      Assessment: 84yoF with septic arthritis s/p patella fracture and ORIF. Patient was at [facility] recovering prior to her presenting here on 4/21 with knee swelling. Patient went to OR 4/23 for I&D along with hardware removal. Patient was started on broad spectrum abx for possible osteo, now blood cultures 1/2 S. aureus. ID on board, dc'd Zosyn. Recommends continuing vancomycin.  wbc 7.8, afebrile.   4/22 zosyn>>4/23 4/22 vanc>>  Goal of Therapy:  Eradication of infection  Vancomycin trough level 15-20 mcg/ml  Plan:  Continue vancomycin 1250mg  q24h Obtain VT tomorrow at 2200 on 4/25  Monitor renal function, cultures, and clinical progress  Thank you for allowing pharmacy to be part of this patient's care team  [place], Pharm.D Clinical Pharmacy Resident Pager: [phone] 12/25/2014 .10:13 AM

## 2014-12-25 NOTE — Progress Notes (Signed)
Attempted to call report to 5N. 

## 2014-12-25 NOTE — Evaluation (Signed)
Physical Therapy Evaluation Patient Details Name: Laura Davenport MRN: [id number] DOB: 1931/08/30 Today's Date: 12/25/2014   History of Present Illness  81 y.o. female who had an ORIF of a left patella fracture 6 weeks ago,.Admitted due to increaed pain/swelling L knee and to r/o infection. Pt with Bledsoe brace locked out in extension s/p I&D and wound vac  Clinical Impression  Ms. Heidtke has very decreased strength, decreased ability to perform any and all tranfers, unable to tolerate OOB at this time and demonstrates decreased desire to progress or increase mobility and function. Pt and family state they want her to return home but family unable to provide enough physical assist for mobility and would have to resort to bed level function and hoyer lift if pt chooses to return home. Pt encouraged to push for mobility and function but requires max cueing for encouragement and participation throughout. Pt with above and below deficits who will benefit from acute therapy to maximize mobility, strength and function to decrease burden of care. Pt and family encouraged to increase daily mobility and bil LE movement.    Follow Up Recommendations SNF    Equipment Recommendations  None recommended by PT    Recommendations for Other Services       Precautions / Restrictions Precautions Precaution Comments: Bledsoe brace locked out in full extension Required Braces or Orthoses: Other Brace/Splint Restrictions Weight Bearing Restrictions: Yes LLE Weight Bearing: Weight bearing as tolerated      Mobility  Bed Mobility Overal bed mobility: Needs Assistance Bed Mobility: Supine to Sit;Sit to Supine     Supine to sit: Mod assist;+2 for physical assistance Sit to supine: Max assist;+2 for physical assistance   General bed mobility comments: pt with max cueing and assist to slide legs toward EOB with pt utilizing gait belt as leg lifter for LLE. Increased time for all movement with assist to  elevate trunk from surface, max cues for anterior lean and sequence with use of pad to pivot leg and hips toward EOB. Pt able to achieve sitting EOB but maintained right posterior lean and unable to achieve full upright posture. Max assist +2 to bring legs back onto bed and control trunk for return to supine and slide toward [facility]  Transfers Overall transfer level:  (unable to transfer OOB)                  Ambulation/Gait                Stairs            Wheelchair Mobility    Modified Rankin (Stroke Patients Only)       Balance Overall balance assessment: Needs assistance   Sitting balance-Leahy Scale: Zero                                       Pertinent Vitals/Pain Pain Location: LLE - widely varied stating 10/10 with lifting leg but when dependent, numb and when static no pain Pain Descriptors / Indicators: Aching Pain Intervention(s): Limited activity within patient's tolerance;Repositioned;RN gave pain meds during session    Home Living Family/patient expects to be discharged to:: Skilled nursing facility                      Prior Function Level of Independence: Needs assistance   Gait / Transfers Assistance Needed: max +2 assist to pivot to  chair at [facility] since fall     Comments: Prior to fall, lived independently at home     Hand Dominance        Extremity/Trunk Assessment   Upper Extremity Assessment: Defer to OT evaluation           Lower Extremity Assessment: Generalized weakness;RLE deficits/detail;LLE deficits/detail RLE Deficits / Details: grossly 2-/5, able to bend knee and slide off bed but required assist to bring it back onto bed LLE Deficits / Details: patellar fx in Bledsoe, pt unable to lift leg without total assist  Cervical / Trunk Assessment: Kyphotic  Communication   Communication: No difficulties  Cognition Arousal/Alertness: Awake/alert Behavior During Therapy: Flat  affect Overall Cognitive Status: Impaired/Different from baseline Area of Impairment: Problem solving             Problem Solving: Slow processing General Comments: pt with very limited words throughout and decreased responsiveness to questions and commands, family present and did not seem alarmed     General Comments      Exercises        Assessment/Plan    PT Assessment Patient needs continued PT services  PT Diagnosis Difficulty walking;Generalized weakness;Acute pain;Altered mental status   PT Problem List Decreased strength;Decreased range of motion;Decreased activity tolerance;Decreased balance;Decreased mobility;Decreased safety awareness;Decreased knowledge of use of DME;Decreased cognition;Obesity  PT Treatment Interventions DME instruction;Functional mobility training;Therapeutic activities;Therapeutic exercise;Balance training;Patient/family education   PT Goals (Current goals can be found in the Care Plan section) Acute Rehab PT Goals Patient Stated Goal: return home PT Goal Formulation: With patient/family Time For Goal Achievement: 01/08/15 Potential to Achieve Goals: Fair    Frequency Min 2X/week   Barriers to discharge Decreased caregiver support      Co-evaluation               End of Session   Activity Tolerance: No increased pain;Patient limited by fatigue Patient left: in bed;with call bell/phone within reach;with nursing/sitter in room;with family/visitor present Nurse Communication: Mobility status;Precautions;Weight bearing status;Patient requests pain meds;Need for lift equipment         Time: 1326-1409 PT Time Calculation (min) (ACUTE ONLY): 43 min   Charges:   PT Evaluation $Initial PT Evaluation Tier I: 1 Procedure PT Treatments $Therapeutic Activity: 23-37 mins   PT G Codes:        Melford Aase 12/25/2014, 2:23 PM Elwyn Reach, [place]

## 2014-12-25 NOTE — Progress Notes (Signed)
     Subjective:  POD#1 I/D of the left knee.  Patient reports pain as moderate.    Objective:   VITALS:   Filed Vitals:   12/25/14 0430 12/25/14 0545 12/25/14 0931 12/25/14 1142  BP: 98/50 94/50 99/53  113/59  Pulse: 86  81 86  Temp: 98.6 F (37 C)  98.6 F (37 C) 99.1 F (37.3 C)  TempSrc: Oral  Oral Oral  Resp: 18  17 18   Height:      Weight:      SpO2: 99%  97% 98%    Neurologically intact ABD soft Neurovascular intact Sensation intact distally Intact pulses distally Dorsiflexion/Plantar flexion intact Incision: dressing C/D/I Wound vac actively connected to suction on left knee  Lab Results  Component Value Date   WBC 7.8 12/25/2014   HGB 9.7* 12/25/2014   HCT 27.9* 12/25/2014   MCV 91.8 12/25/2014   PLT 288 12/25/2014   BMET    Component Value Date/Time   NA 134* 12/25/2014 0615   K 5.2* 12/25/2014 0615   CL 106 12/25/2014 0615   CO2 22 12/25/2014 0615   GLUCOSE 84 12/25/2014 0615   BUN 18 12/25/2014 0615   CREATININE 1.04 12/25/2014 0615   CALCIUM 7.9* 12/25/2014 0615   GFRNONAA 49* 12/25/2014 0615   GFRAA 56* 12/25/2014 0615     Assessment/Plan: 1 Day Post-Op   Principal Problem:   Fever Active Problems:   Diabetes mellitus without complication   Hypertension   Depression   Knee fracture, left   Sepsis   Tachycardia with 141 - 160 beats per minute   Essential hypertension   Septic joint of left knee joint   Blood poisoning   Up with therapy WBAT in the LLE in the knee immobilizer at all times  DVT prophylaxis per medicine, no contraindications from ortho standpoint. Plan to remove wound vac in 2 days and change to sterile dressing.  Donovyn Guidice Lelan Pons 12/25/2014, 12:22 PM Cell [phone]

## 2014-12-25 NOTE — Progress Notes (Signed)
[facility] PROGRESS NOTE  Laura Davenport [id number] DOB: 08-04-1931 DOA: 12/22/2014 PCP: [name], MD  Assessment/Plan: sepsis: Appears to be secondary to left knee septic joint; patient with 1/2 positive blood cx's for staph aureus  -Fluid aspirated has been sent for culture and results are pending -1/2 blood cultures positive for staph aureus -Will continue vancomycin as per infectious disease recommendations -Taken to the OR on 12/24/2014; has received arthroscopy and knee washout with hardware removal -Will continue prn analgesics and antipyretics -Will follow ID recommendations regarding which antibiotic, for how long and route of administration based on culture results. -no valve abnormalities appreciated on TTE -patient now non-toxic and feeling better -no fever and WBC's WNL  Tachycardia: Heart rate 148, indicating presumed flutter with 2:1 block. -trop is slightly elevated at 0.04 and 0.06; most likely demand ischemia from ongoing tachycardia -patient w/o CP and no SOB -2-D echo demonstrating no wall motion abnormalities, preserved EF and just grade 1 diastolic heart failure. -Continue oral cardizem and adjust -Continue ASA -continue heart healthy diet  AoCKD-III: In the setting of sepsis and continue use of nephrotoxic agents -Renal ultrasound without hydronephrosis or obstruction -Continue holding losartan and HCTZ for now -Will continue IV fluids at adjusted rate for another 24 hours -Creatinine 1.05 -Will continue monitoring  HTN: Blood pressure is stable, but soft -Will continue holding home antihypertensive drugs  -Will continue cardizem PO now -Continue prn hydralazine -Continue IV fluids; but changed to rate of 50 mL/h for another 24 hours  Diabetes mellitus: No A1C on record. Patient is on Lantus 30 units daily  -continue decrease Lantus dose of 20 units; CBG is stable with current regimen -sliding scale insulin  -A1c 6.4   Hyperlipidemia:  Patient is on Lipitor at home. No LDL record. -Continue Lipitor  Blood cultures 1/2 positive for staph aureus -patient on vancomycin -repeated blood cx's on 4/24 ordered -ID on board  Code Status: Full code Family Communication: No family at bedside Disposition Plan: Remains inpatient; but will transfer to telemetry bed   Consultants:  Edmonia Lynch (orthopedic service)  Scharlene Gloss (infectious disease)  Procedures:  See below for x-ray reports  Arthroscopy and washout of her left knee with hardware removal   4/23  2-D echo: - Left ventricle: The cavity size was normal. Wall thickness was increased in a pattern of moderate LVH. Systolic function was normal. The estimated ejection fraction was in the range of 55% to 60%. Wall motion was normal; there were no regional wall motion abnormalities. Doppler parameters are consistent with abnormal left ventricular relaxation (grade 1 diastolic dysfunction). - Mitral valve: There was mild regurgitation.  Antibiotics:  Vancomycin 4/22  Cefepime 4/22>>4/23  HPI/Subjective: Afebrile, no chest pain or shortness of breath. Positive 1/2 blood cx for staph aureus. Patient tachycardia resolved and off cardizem.  Objective: Filed Vitals:   12/25/14 0545  BP: 94/50  Pulse:   Temp:   Resp:     Intake/Output Summary (Last 24 hours) at 12/25/14 0916 Last data filed at 12/24/14 2300  Gross per 24 hour  Intake   1408 ml  Output      1 ml  Net   1407 ml   Filed Weights   12/22/14 2123 12/22/14 2200  Weight: 88.6 kg (195 lb 5.2 oz) 88.6 kg (195 lb 5.2 oz)    Exam:  General:  Afebrile; no chest pain, no shortness of breath. Status post left knee arthroscopy and washout with hardware removal on 4/23. Positive 1/2 blood cx  for staph aureus. Patient tachycardia resolved and off cardizem. Cardiovascular: Regular rate, positive murmur, no rubs, no gallops Respiratory: Good air movement, no wheezing, no  crackles Abdomen: Soft, nontender, nondistended, positive bowel sounds Musculoskeletal: Left leg with dressing and drain in place. Per patient less painful; no swelling  Data Reviewed: Basic Metabolic Panel:  Recent Labs Lab 12/22/14 1742 12/23/14 0310 12/24/14 0255 12/25/14 0615  NA 129* 133* 134* 134*  K 4.3 3.6 3.2* 5.2*  CL 94* 103 103 106  CO2 25 21 22 22   GLUCOSE 225* 131* 93 84  BUN 30* 26* 17 18  CREATININE 1.59* 1.14* 1.05 1.04  CALCIUM 8.4 7.8* 7.8* 7.9*   Liver Function Tests:  Recent Labs Lab 12/23/14 0310  AST 22  ALT 19  ALKPHOS 70  BILITOT 1.1  PROT 6.7  ALBUMIN 1.9*   CBC:  Recent Labs Lab 12/19/14 0825 12/22/14 1742 12/23/14 0310 12/24/14 0255 12/25/14 0615  WBC 6.3 12.8* 15.0* 10.3 7.8  NEUTROABS 4.2 11.2*  --   --   --   HGB 9.2* 9.1* 7.7* 7.5* 9.7*  HCT 27.5* 26.6* 23.6* 23.0* 27.9*  MCV 92.9 89.3 87.4 87.1 91.8  PLT 322 354 305 286 288   Cardiac Enzymes:  Recent Labs Lab 12/22/14 2237 12/23/14 0310 12/23/14 1219  TROPONINI 0.03 0.04* 0.06*   BNP (last 3 results)  Recent Labs  12/23/14 0310  BNP 154.3*   CBG:  Recent Labs Lab 12/24/14 0905 12/24/14 1246 12/24/14 1627 12/24/14 2141 12/25/14 0813  GLUCAP 89 179* 158* 159* 73    Recent Results (from the past 240 hour(s))  Culture, blood (x 2)     Status: None (Preliminary result)   Collection Time: 12/22/14  4:45 PM  Result Value Ref Range Status   Specimen Description BLOOD RIGHT ARM  Final   Special Requests BOTTLES DRAWN AEROBIC AND ANAEROBIC 5CC  Final   Culture   Final    STAPHYLOCOCCUS AUREUS Note: RIFAMPIN AND GENTAMICIN SHOULD NOT BE USED AS SINGLE DRUGS FOR TREATMENT OF STAPH INFECTIONS. Note: Gram Stain Report Called to,Read Back By and Verified With: KARA NARIGON @ 10:03 AM 12/24/14 BY DWEEKS Performed at [facility]    Report Status PENDING  Incomplete  MRSA PCR Screening     Status: Abnormal   Collection Time: 12/22/14  9:51 PM  Result  Value Ref Range Status   MRSA by PCR POSITIVE (A) NEGATIVE Final    Comment:        The GeneXpert MRSA Assay (FDA approved for NASAL specimens only), is one component of a comprehensive MRSA colonization surveillance program. It is not intended to diagnose MRSA infection nor to guide or monitor treatment for MRSA infections. RESULT CALLED TO, READ BACK BY AND VERIFIED WITH: ABDUSSALAAM,K RN [phone] AT 0330 SKEEN,P   Culture, blood (x 2)     Status: None (Preliminary result)   Collection Time: 12/22/14 10:37 PM  Result Value Ref Range Status   Specimen Description BLOOD LEFT ANTECUBITAL  Final   Special Requests BOTTLES DRAWN AEROBIC ONLY 10CC  Final   Culture   Final           BLOOD CULTURE RECEIVED NO GROWTH TO DATE CULTURE WILL BE HELD FOR 5 DAYS BEFORE ISSUING A FINAL NEGATIVE REPORT Performed at [facility]    Report Status PENDING  Incomplete  Anaerobic culture     Status: None (Preliminary result)   Collection Time: 12/23/14 11:11 AM  Result Value Ref Range  Status   Specimen Description FLUID SYNOVIAL LEFT KNEE  Final   Special Requests NONE  Final   Gram Stain PENDING  Incomplete   Culture   Final    NO ANAEROBES ISOLATED; CULTURE IN PROGRESS FOR 5 DAYS Performed at [facility]    Report Status PENDING  Incomplete  Body fluid culture     Status: None (Preliminary result)   Collection Time: 12/23/14 11:11 AM  Result Value Ref Range Status   Specimen Description FLUID SYNOVIAL KNEE LEFT  Final   Special Requests NONE  Final   Gram Stain   Final    ABUNDANT WBC PRESENT, PREDOMINANTLY MONONUCLEAR NO ORGANISMS SEEN Performed at [facility]   Final    Culture reincubated for better growth Performed at [facility]    Report Status PENDING  Incomplete  Anaerobic culture     Status: None (Preliminary result)   Collection Time: 12/24/14  8:20 AM  Result Value Ref Range Status   Specimen Description TISSUE LEFT KNEE   Final   Special Requests PATIENT ON FOLLOWING VANCOMYCIN ZOSYN  Final   Gram Stain PENDING  Incomplete   Culture   Final    NO ANAEROBES ISOLATED; CULTURE IN PROGRESS FOR 5 DAYS Performed at [facility]    Report Status PENDING  Incomplete     Studies: Dg Fluoro Guide Ndl Plcd/bx/inj/loc  12/23/2014   CLINICAL DATA:  History of left patellar fracture with subsequent internal fixation 11/17/2014. Now with left knee pain, swelling and erythema.  EXAM: LEFT KNEE ASPIRATION UNDER FLUOROSCOPY  FLUOROSCOPY TIME:  Radiation Exposure Index (as provided by the fluoroscopic device):  If the device does not provide the exposure index:  Fluoroscopy Time (in minutes and seconds):  18 seconds  Number of Acquired Images:  None.  PROCEDURE: The risks and benefits of the procedure were discussed with the patient and written consent was obtained. Overlying skin prepped with Betadine, draped in the usual sterile fashion and infiltrated locally with buffered Lidocaine. An 18 gauge hypodermic needle was used access the right knee joint. Aspiration immediately yielded purulent appearing joint fluid. A total 7.5 cc was aspirated. The patient tolerated the procedure well without immediate complication.  IMPRESSION: Successful aspiration of the left knee joint yielding purulent joint fluid. Specimen sent to pathology for testing.   Electronically Signed   By: Inge Rise M.D.   On: 12/23/2014 11:37    Scheduled Meds: . sodium chloride  10 mL/hr Intravenous Once  . aspirin EC  81 mg Oral Daily  . atorvastatin  80 mg Oral q1800  . Chlorhexidine Gluconate Cloth  6 each Topical Q0600  . diltiazem  60 mg Oral Q12H  . feeding supplement (GLUCERNA SHAKE)  237 mL Oral QAC breakfast  . feeding supplement (PRO-STAT SUGAR FREE 64)  30 mL Oral QHS  . heparin  5,000 Units Subcutaneous 3 times per day  . insulin aspart  0-9 Units Subcutaneous TID WC  . insulin glargine  20 Units Subcutaneous QHS  . mupirocin  ointment  1 application Nasal BID  . nystatin   Topical BID  . sodium chloride  3 mL Intravenous Q12H  . vancomycin  1,250 mg Intravenous Q24H   Continuous Infusions: . sodium chloride 50 mL/hr (12/24/14 1828)    Principal Problem:   Fever Active Problems:   Diabetes mellitus without complication   Hypertension   Depression   Knee fracture, left   Sepsis  Tachycardia with 141 - 160 beats per minute    Time spent: 50 minutes (more than 50% of the time dedicated to face-to-face examination, care coordination and discussion with specialist involved in the case).    Barton Dubois  [facility] Pager [phone]. If 7PM-7AM, please contact night-coverage at [email or web address], password [facility] 12/25/2014, 9:16 AM  LOS: 3 days

## 2014-12-26 ENCOUNTER — Encounter (HOSPITAL_COMMUNITY): Payer: Self-pay | Admitting: Orthopedic Surgery

## 2014-12-26 DIAGNOSIS — B9562 Methicillin resistant Staphylococcus aureus infection as the cause of diseases classified elsewhere: Secondary | ICD-10-CM

## 2014-12-26 DIAGNOSIS — R7881 Bacteremia: Secondary | ICD-10-CM | POA: Insufficient documentation

## 2014-12-26 DIAGNOSIS — M00062 Staphylococcal arthritis, left knee: Secondary | ICD-10-CM

## 2014-12-26 DIAGNOSIS — N179 Acute kidney failure, unspecified: Secondary | ICD-10-CM | POA: Insufficient documentation

## 2014-12-26 LAB — BASIC METABOLIC PANEL
Anion gap: 7 (ref 5–15)
BUN: 15 mg/dL (ref 6–23)
CO2: 21 mmol/L (ref 19–32)
Calcium: 7.7 mg/dL — ABNORMAL LOW (ref 8.4–10.5)
Chloride: 103 mmol/L (ref 96–112)
Creatinine, Ser: 0.87 mg/dL (ref 0.50–1.10)
GFR calc Af Amer: 70 mL/min — ABNORMAL LOW (ref >90)
GFR calc non Af Amer: 60 mL/min — ABNORMAL LOW (ref >90)
Glucose, Bld: 116 mg/dL — ABNORMAL HIGH (ref 70–99)
Potassium: 4.7 mmol/L (ref 3.5–5.1)
Sodium: 131 mmol/L — ABNORMAL LOW (ref 135–145)

## 2014-12-26 LAB — TYPE AND SCREEN
ABO/RH(D): A POS
Antibody Screen: NEGATIVE
Unit division: 0
Unit division: 0
Unit division: 0
Unit division: 0

## 2014-12-26 LAB — CULTURE, BLOOD (ROUTINE X 2)

## 2014-12-26 LAB — CBC
HCT: 30.5 % — ABNORMAL LOW (ref 36.0–46.0)
Hemoglobin: 10.4 g/dL — ABNORMAL LOW (ref 12.0–15.0)
MCH: 31.9 pg (ref 26.0–34.0)
MCHC: 34.1 g/dL (ref 30.0–36.0)
MCV: 93.6 fL (ref 78.0–100.0)
Platelets: 294 10*3/uL (ref 150–400)
RBC: 3.26 MIL/uL — ABNORMAL LOW (ref 3.87–5.11)
RDW: 14.8 % (ref 11.5–15.5)
WBC: 5.7 10*3/uL (ref 4.0–10.5)

## 2014-12-26 LAB — GLUCOSE, CAPILLARY
Glucose-Capillary: 121 mg/dL — ABNORMAL HIGH (ref 70–99)
Glucose-Capillary: 123 mg/dL — ABNORMAL HIGH (ref 70–99)
Glucose-Capillary: 110 mg/dL — ABNORMAL HIGH (ref 70–99)
Glucose-Capillary: 85 mg/dL (ref 70–99)
Glucose-Capillary: 116 mg/dL — ABNORMAL HIGH (ref 70–99)

## 2014-12-26 LAB — BODY FLUID CULTURE

## 2014-12-26 NOTE — Progress Notes (Signed)
Red rash on left upper arm and posterior forearm, marked for monitoring. Fredirick Maudlin, NP paged and notified.  Family concerned it may be allergy to medication.  Order for Benadryl 25mg  po once received.  No complaints of itching or discomfort.  Will continue to monitor.

## 2014-12-26 NOTE — Progress Notes (Signed)
[facility]  Date of Admission:  12/22/2014  Antibiotics: vancomycin  Subjective: No complaints, more sleepy since getting benadryl, rash noted of left arm but is improving  Objective: Temp:  [99 F (37.2 C)-99.3 F (37.4 C)] 99 F (37.2 C) (04/25 0617) Pulse Rate:  [86-109] 109 (04/25 0617) Resp:  [18] 18 (04/25 0617) BP: (113-130)/(59-69) 130/69 mmHg (04/25 0617) SpO2:  [98 %-100 %] 100 % (04/25 0617)  General: awake, sleepy Skin: left arm with confluent erythematous rash, not warm Lungs: CTA B Cor: RRR Abdomen: soft, nt, nd Ext: left leg in wrap  Lab Results Lab Results  Component Value Date   WBC 5.7 12/26/2014   HGB 10.4* 12/26/2014   HCT 30.5* 12/26/2014   MCV 93.6 12/26/2014   PLT 294 12/26/2014    Lab Results  Component Value Date   CREATININE 0.87 12/26/2014   BUN 15 12/26/2014   NA 131* 12/26/2014   K 4.7 12/26/2014   CL 103 12/26/2014   CO2 21 12/26/2014    Lab Results  Component Value Date   ALT 19 12/23/2014   AST 22 12/23/2014   ALKPHOS 70 12/23/2014   BILITOT 1.1 12/23/2014      Microbiology: Recent Results (from the past 240 hour(s))  Culture, blood (x 2)     Status: None   Collection Time: 12/22/14  4:45 PM  Result Value Ref Range Status   Specimen Description BLOOD RIGHT ARM  Final   Special Requests BOTTLES DRAWN AEROBIC AND ANAEROBIC 5CC  Final   Culture   Final    METHICILLIN RESISTANT STAPHYLOCOCCUS AUREUS Note: RIFAMPIN AND GENTAMICIN SHOULD NOT BE USED AS SINGLE DRUGS FOR TREATMENT OF STAPH INFECTIONS. CRITICAL RESULT CALLED TO, READ BACK BY AND VERIFIED WITH: DONNA NIEMELA @ 2:27 PM 12/25/14 BY DWEEKS This organism DOES NOT demonstrate inducible  Clindamycin resistance in vitro. Note: Gram Stain Report Called to,Read Back By and Verified With: KARA NARIGON @ 10:03 AM 12/24/14 BY DWEEKS Performed at [facility]    Report Status 12/26/2014 FINAL  Final   Organism ID, Bacteria METHICILLIN  RESISTANT STAPHYLOCOCCUS AUREUS  Final      Susceptibility   Methicillin resistant staphylococcus aureus - MIC*    CLINDAMYCIN <=0.25 SENSITIVE Sensitive     ERYTHROMYCIN >=8 RESISTANT Resistant     GENTAMICIN <=0.5 SENSITIVE Sensitive     LEVOFLOXACIN >=8 RESISTANT Resistant     OXACILLIN >=4 RESISTANT Resistant     PENICILLIN >=0.5 RESISTANT Resistant     RIFAMPIN <=0.5 SENSITIVE Sensitive     TRIMETH/SULFA >=320 RESISTANT Resistant     VANCOMYCIN <=0.5 SENSITIVE Sensitive     TETRACYCLINE <=1 SENSITIVE Sensitive     * METHICILLIN RESISTANT STAPHYLOCOCCUS AUREUS  MRSA PCR Screening     Status: Abnormal   Collection Time: 12/22/14  9:51 PM  Result Value Ref Range Status   MRSA by PCR POSITIVE (A) NEGATIVE Final    Comment:        The GeneXpert MRSA Assay (FDA approved for NASAL specimens only), is one component of a comprehensive MRSA colonization surveillance program. It is not intended to diagnose MRSA infection nor to guide or monitor treatment for MRSA infections. RESULT CALLED TO, READ BACK BY AND VERIFIED WITH: ABDUSSALAAM,K RN [phone] AT 0330 SKEEN,P   Culture, blood (x 2)     Status: None (Preliminary result)   Collection Time: 12/22/14 10:37 PM  Result Value Ref Range Status   Specimen Description BLOOD LEFT  ANTECUBITAL  Final   Special Requests BOTTLES DRAWN AEROBIC ONLY 10CC  Final   Culture   Final           BLOOD CULTURE RECEIVED NO GROWTH TO DATE CULTURE WILL BE HELD FOR 5 DAYS BEFORE ISSUING A FINAL NEGATIVE REPORT Performed at [facility]    Report Status PENDING  Incomplete  Anaerobic culture     Status: None (Preliminary result)   Collection Time: 12/23/14 11:11 AM  Result Value Ref Range Status   Specimen Description FLUID SYNOVIAL LEFT KNEE  Final   Special Requests NONE  Final   Gram Stain PENDING  Incomplete   Culture   Final    NO ANAEROBES ISOLATED; CULTURE IN PROGRESS FOR 5 DAYS Performed at [facility]    Report Status  PENDING  Incomplete  Body fluid culture     Status: None   Collection Time: 12/23/14 11:11 AM  Result Value Ref Range Status   Specimen Description FLUID SYNOVIAL KNEE LEFT  Final   Special Requests NONE  Final   Gram Stain   Final    ABUNDANT WBC PRESENT, PREDOMINANTLY MONONUCLEAR NO ORGANISMS SEEN Performed at [facility]    Culture   Final    FEW METHICILLIN RESISTANT STAPHYLOCOCCUS AUREUS Note: RIFAMPIN AND GENTAMICIN SHOULD NOT BE USED AS SINGLE DRUGS FOR TREATMENT OF STAPH INFECTIONS. CRITICAL RESULT CALLED TO, READ BACK BY AND VERIFIED WITH: DONNA NIEMELA @ 11:49 AM 12/25/14 BY DWEEKS This organism DOES NOT demonstrate inducible  Clindamycin resistance in vitro. Performed at [facility]    Report Status 12/26/2014 FINAL  Final   Organism ID, Bacteria METHICILLIN RESISTANT STAPHYLOCOCCUS AUREUS  Final      Susceptibility   Methicillin resistant staphylococcus aureus - MIC*    CLINDAMYCIN <=0.25 SENSITIVE Sensitive     ERYTHROMYCIN >=8 RESISTANT Resistant     GENTAMICIN <=0.5 SENSITIVE Sensitive     LEVOFLOXACIN >=8 RESISTANT Resistant     OXACILLIN >=4 RESISTANT Resistant     PENICILLIN >=0.5 RESISTANT Resistant     RIFAMPIN <=0.5 SENSITIVE Sensitive     TRIMETH/SULFA >=320 RESISTANT Resistant     VANCOMYCIN 1 SENSITIVE Sensitive     TETRACYCLINE <=1 SENSITIVE Sensitive     * FEW METHICILLIN RESISTANT STAPHYLOCOCCUS AUREUS  Anaerobic culture     Status: None (Preliminary result)   Collection Time: 12/24/14  8:20 AM  Result Value Ref Range Status   Specimen Description TISSUE LEFT KNEE  Final   Special Requests PATIENT ON FOLLOWING VANCOMYCIN ZOSYN  Final   Gram Stain PENDING  Incomplete   Culture   Final    NO ANAEROBES ISOLATED; CULTURE IN PROGRESS FOR 5 DAYS Performed at [facility]    Report Status PENDING  Incomplete  Tissue culture     Status: None (Preliminary result)   Collection Time: 12/24/14  8:20 AM  Result Value Ref Range Status    Specimen Description TISSUE LEFT KNEE  Final   Special Requests PATIENT ON FOLLOWING VANCOMYCIN ZOSYN  Final   Gram Stain PENDING  Incomplete   Culture   Final    RARE STAPHYLOCOCCUS AUREUS Note: RIFAMPIN AND GENTAMICIN SHOULD NOT BE USED AS SINGLE DRUGS FOR TREATMENT OF STAPH INFECTIONS. Performed at [facility]    Report Status PENDING  Incomplete  Culture, blood (routine x 2)     Status: None (Preliminary result)   Collection Time: 12/25/14  6:15 AM  Result Value Ref Range Status  Specimen Description BLOOD LEFT ANTECUBITAL  Final   Special Requests   Final    BOTTLES DRAWN AEROBIC AND ANAEROBIC 10CC BLUE 5CC RED   Culture   Final           BLOOD CULTURE RECEIVED NO GROWTH TO DATE CULTURE WILL BE HELD FOR 5 DAYS BEFORE ISSUING A FINAL NEGATIVE REPORT Performed at [facility]    Report Status PENDING  Incomplete  Culture, blood (routine x 2)     Status: None (Preliminary result)   Collection Time: 12/25/14  6:19 AM  Result Value Ref Range Status   Specimen Description BLOOD RIGHT HAND  Final   Special Requests BOTTLES DRAWN AEROBIC ONLY 10CC  Final   Culture   Final           BLOOD CULTURE RECEIVED NO GROWTH TO DATE CULTURE WILL BE HELD FOR 5 DAYS BEFORE ISSUING A FINAL NEGATIVE REPORT Performed at [facility]    Report Status PENDING  Incomplete    Studies/Results: No results found.  Assessment/Plan:  1) septic arthritis with MRSA - continue with vancomycin for 4 weeks through 5/21 as long as repeat blood cultures remain negative. Ok to put in a picc when the blood cultures from yesterday are negative at 48 hours tomorrow.  Scharlene Gloss, [place] [facility] [place] [email or web address] [phone] pager   [phone] cell 12/26/2014, 11:22 AM

## 2014-12-26 NOTE — Progress Notes (Addendum)
[facility] PROGRESS NOTE  EZINNE PUENTES [id number] DOB: Jun 21, 1931 DOA: 12/22/2014 PCP: [name], MD  Assessment/Plan: sepsis: Appears to be secondary to left knee septic joint; patient with 1/2 positive blood cx's for MRSA nd also positive joint aspiration culture positive for MRSA -Fluid aspirated has been sent for culture and results are pending -1/2 blood cultures positive for MRSA and positive joint aspiration cx's for MRSA -Will continue vancomycin as per infectious disease recommendations (four weeks of treatment) -Taken to the OR on 12/24/2014; has received arthroscopy and knee washout with hardware removal -Will continue prn analgesics and antipyretics -Will follow ID recommendations regarding which antibiotic, for how long and route of administration based on culture results. -no valve abnormalities appreciated on TTE -patient now non-toxic and feeling better -no fever and WBC's WNL  Tachycardia: Heart rate 148, indicating presumed flutter with 2:1 block. -trop is slightly elevated at 0.04 and 0.06; most likely demand ischemia from ongoing tachycardia -patient w/o CP and no SOB -2-D echo demonstrating no wall motion abnormalities, preserved EF and just grade 1 diastolic heart failure. -Continue oral cardizem and adjust as needed; hopefully will be able to discontinued it prior to discharge -Continue ASA -continue heart healthy diet -Chadsvasc score 5; currently on heparin -once clear for not further intervention will discussed with patient for initiation on eliquis as anticoagulation   AoCKD-III: In the setting of sepsis and continue use of nephrotoxic agents -Renal ultrasound without hydronephrosis or obstruction -Continue holding losartan and HCTZ for now -IVF's changed to KVO -Creatinine back to normal  HTN: Blood pressure is stable and improving -Will continue holding home antihypertensive drugs  -Will continue cardizem PO now -Continue prn  hydralazine -will change IVF's to KVO  Diabetes mellitus: No A1C on record. Patient is on Lantus 30 units daily  -continue decrease Lantus dose of 20 units; CBG is stable with current regimen -sliding scale insulin  -A1c 6.4   Hyperlipidemia: Patient is on Lipitor at home. No LDL record. -Continue Lipitor  Blood cultures 1/2 positive for MRSA and positive joint aspiration cultures positive for MRSA -patient on vancomycin -repeated blood cx's on 4/24 ordered; pending if neg >48 hours will place PICC -per ID recommendations will treat with vancomycin for 4 weeks (starting to count day after surgery); she will finish treatment on 01/21/15  Code Status: Full code Family Communication: No family at bedside Disposition Plan: Remains inpatient; but will transfer to telemetry bed   Consultants:  Edmonia Lynch (orthopedic service)  Scharlene Gloss (infectious disease)  Procedures:  See below for x-ray reports  Arthroscopy and washout of her left knee with hardware removal   4/23  2-D echo: - Left ventricle: The cavity size was normal. Wall thickness was increased in a pattern of moderate LVH. Systolic function was normal. The estimated ejection fraction was in the range of 55% to 60%. Wall motion was normal; there were no regional wall motion abnormalities. Doppler parameters are consistent with abnormal left ventricular relaxation (grade 1 diastolic dysfunction). - Mitral valve: There was mild regurgitation.  Antibiotics:  Vancomycin 4/22  Cefepime 4/22>>4/23  HPI/Subjective: Afebrile, no chest pain or shortness of breath. Positive 1/2 blood cx for MRSA and also positive joint aspiration cx's for MRSA. Rash on left arm overnight, improving (she received benadryl)  Objective: Filed Vitals:   12/26/14 0617  BP: 130/69  Pulse: 109  Temp: 99 F (37.2 C)  Resp: 18    Intake/Output Summary (Last 24 hours) at 12/26/14 1440 Last data filed at 12/26/14  [id number]   Gross per 24 hour  Intake      0 ml  Output      2 ml  Net     -2 ml   Filed Weights   12/22/14 2123 12/22/14 2200  Weight: 88.6 kg (195 lb 5.2 oz) 88.6 kg (195 lb 5.2 oz)    Exam:  General:  Afebrile; slightly sleepy but well oriented (got benadryl last night for skin rash); no CP, no nausea, no vomiting and no shortness of breath. Status post left knee arthroscopy and washout with hardware removal on 4/23.  Cardiovascular: Regular rate, positive murmur, no rubs, no gallops Respiratory: Good air movement, no wheezing, no crackles Abdomen: Soft, nontender, nondistended, positive bowel sounds Musculoskeletal: Left leg with dressing and drain in place. Per patient pain 3/10; no swelling  Data Reviewed: Basic Metabolic Panel:  Recent Labs Lab 12/22/14 1742 12/23/14 0310 12/24/14 0255 12/25/14 0615 12/26/14 0430  NA 129* 133* 134* 134* 131*  K 4.3 3.6 3.2* 5.2* 4.7  CL 94* 103 103 106 103  CO2 25 21 22 22 21   GLUCOSE 225* 131* 93 84 116*  BUN 30* 26* 17 18 15   CREATININE 1.59* 1.14* 1.05 1.04 0.87  CALCIUM 8.4 7.8* 7.8* 7.9* 7.7*   Liver Function Tests:  Recent Labs Lab 12/23/14 0310  AST 22  ALT 19  ALKPHOS 70  BILITOT 1.1  PROT 6.7  ALBUMIN 1.9*   CBC:  Recent Labs Lab 12/22/14 1742 12/23/14 0310 12/24/14 0255 12/25/14 0615 12/26/14 0430  WBC 12.8* 15.0* 10.3 7.8 5.7  NEUTROABS 11.2*  --   --   --   --   HGB 9.1* 7.7* 7.5* 9.7* 10.4*  HCT 26.6* 23.6* 23.0* 27.9* 30.5*  MCV 89.3 87.4 87.1 91.8 93.6  PLT 354 305 286 288 294   Cardiac Enzymes:  Recent Labs Lab 12/22/14 2237 12/23/14 0310 12/23/14 1219  TROPONINI 0.03 0.04* 0.06*   BNP (last 3 results)  Recent Labs  12/23/14 0310  BNP 154.3*   CBG:  Recent Labs Lab 12/25/14 1132 12/25/14 1640 12/25/14 2143 12/26/14 0613 12/26/14 1206  GLUCAP 121* 78 86 123* 110*    Recent Results (from the past 240 hour(s))  Culture, blood (x 2)     Status: None   Collection Time: 12/22/14  4:45  PM  Result Value Ref Range Status   Specimen Description BLOOD RIGHT ARM  Final   Special Requests BOTTLES DRAWN AEROBIC AND ANAEROBIC 5CC  Final   Culture   Final    METHICILLIN RESISTANT STAPHYLOCOCCUS AUREUS Note: RIFAMPIN AND GENTAMICIN SHOULD NOT BE USED AS SINGLE DRUGS FOR TREATMENT OF STAPH INFECTIONS. CRITICAL RESULT CALLED TO, READ BACK BY AND VERIFIED WITH: DONNA NIEMELA @ 2:27 PM 12/25/14 BY DWEEKS This organism DOES NOT demonstrate inducible  Clindamycin resistance in vitro. Note: Gram Stain Report Called to,Read Back By and Verified With: KARA NARIGON @ 10:03 AM 12/24/14 BY DWEEKS Performed at [facility]    Report Status 12/26/2014 FINAL  Final   Organism ID, Bacteria METHICILLIN RESISTANT STAPHYLOCOCCUS AUREUS  Final      Susceptibility   Methicillin resistant staphylococcus aureus - MIC*    CLINDAMYCIN <=0.25 SENSITIVE Sensitive     ERYTHROMYCIN >=8 RESISTANT Resistant     GENTAMICIN <=0.5 SENSITIVE Sensitive     LEVOFLOXACIN >=8 RESISTANT Resistant     OXACILLIN >=4 RESISTANT Resistant     PENICILLIN >=0.5 RESISTANT Resistant     RIFAMPIN <=0.5 SENSITIVE Sensitive  TRIMETH/SULFA >=320 RESISTANT Resistant     VANCOMYCIN <=0.5 SENSITIVE Sensitive     TETRACYCLINE <=1 SENSITIVE Sensitive     * METHICILLIN RESISTANT STAPHYLOCOCCUS AUREUS  MRSA PCR Screening     Status: Abnormal   Collection Time: 12/22/14  9:51 PM  Result Value Ref Range Status   MRSA by PCR POSITIVE (A) NEGATIVE Final    Comment:        The GeneXpert MRSA Assay (FDA approved for NASAL specimens only), is one component of a comprehensive MRSA colonization surveillance program. It is not intended to diagnose MRSA infection nor to guide or monitor treatment for MRSA infections. RESULT CALLED TO, READ BACK BY AND VERIFIED WITH: ABDUSSALAAM,K RN [phone] AT 0330 SKEEN,P   Culture, blood (x 2)     Status: None (Preliminary result)   Collection Time: 12/22/14 10:37 PM  Result Value Ref  Range Status   Specimen Description BLOOD LEFT ANTECUBITAL  Final   Special Requests BOTTLES DRAWN AEROBIC ONLY 10CC  Final   Culture   Final           BLOOD CULTURE RECEIVED NO GROWTH TO DATE CULTURE WILL BE HELD FOR 5 DAYS BEFORE ISSUING A FINAL NEGATIVE REPORT Performed at [facility]    Report Status PENDING  Incomplete  Anaerobic culture     Status: None (Preliminary result)   Collection Time: 12/23/14 11:11 AM  Result Value Ref Range Status   Specimen Description FLUID SYNOVIAL LEFT KNEE  Final   Special Requests NONE  Final   Gram Stain PENDING  Incomplete   Culture   Final    NO ANAEROBES ISOLATED; CULTURE IN PROGRESS FOR 5 DAYS Performed at [facility]    Report Status PENDING  Incomplete  Body fluid culture     Status: None   Collection Time: 12/23/14 11:11 AM  Result Value Ref Range Status   Specimen Description FLUID SYNOVIAL KNEE LEFT  Final   Special Requests NONE  Final   Gram Stain   Final    ABUNDANT WBC PRESENT, PREDOMINANTLY MONONUCLEAR NO ORGANISMS SEEN Performed at [facility]    Culture   Final    FEW METHICILLIN RESISTANT STAPHYLOCOCCUS AUREUS Note: RIFAMPIN AND GENTAMICIN SHOULD NOT BE USED AS SINGLE DRUGS FOR TREATMENT OF STAPH INFECTIONS. CRITICAL RESULT CALLED TO, READ BACK BY AND VERIFIED WITH: DONNA NIEMELA @ 11:49 AM 12/25/14 BY DWEEKS This organism DOES NOT demonstrate inducible  Clindamycin resistance in vitro. Performed at [facility]    Report Status 12/26/2014 FINAL  Final   Organism ID, Bacteria METHICILLIN RESISTANT STAPHYLOCOCCUS AUREUS  Final      Susceptibility   Methicillin resistant staphylococcus aureus - MIC*    CLINDAMYCIN <=0.25 SENSITIVE Sensitive     ERYTHROMYCIN >=8 RESISTANT Resistant     GENTAMICIN <=0.5 SENSITIVE Sensitive     LEVOFLOXACIN >=8 RESISTANT Resistant     OXACILLIN >=4 RESISTANT Resistant     PENICILLIN >=0.5 RESISTANT Resistant     RIFAMPIN <=0.5 SENSITIVE Sensitive      TRIMETH/SULFA >=320 RESISTANT Resistant     VANCOMYCIN 1 SENSITIVE Sensitive     TETRACYCLINE <=1 SENSITIVE Sensitive     * FEW METHICILLIN RESISTANT STAPHYLOCOCCUS AUREUS  Anaerobic culture     Status: None (Preliminary result)   Collection Time: 12/24/14  8:20 AM  Result Value Ref Range Status   Specimen Description TISSUE LEFT KNEE  Final   Special Requests PATIENT ON FOLLOWING VANCOMYCIN ZOSYN  Final  Gram Stain PENDING  Incomplete   Culture   Final    NO ANAEROBES ISOLATED; CULTURE IN PROGRESS FOR 5 DAYS Performed at [facility]    Report Status PENDING  Incomplete  Tissue culture     Status: None (Preliminary result)   Collection Time: 12/24/14  8:20 AM  Result Value Ref Range Status   Specimen Description TISSUE LEFT KNEE  Final   Special Requests PATIENT ON FOLLOWING VANCOMYCIN ZOSYN  Final   Gram Stain   Final    ABUNDANT WBC PRESENT, PREDOMINANTLY PMN NO SQUAMOUS EPITHELIAL CELLS SEEN RARE GRAM POSITIVE COCCI IN PAIRS Performed at [facility]    Culture   Final    RARE STAPHYLOCOCCUS AUREUS Note: RIFAMPIN AND GENTAMICIN SHOULD NOT BE USED AS SINGLE DRUGS FOR TREATMENT OF STAPH INFECTIONS. Performed at [facility]    Report Status PENDING  Incomplete  Culture, blood (routine x 2)     Status: None (Preliminary result)   Collection Time: 12/25/14  6:15 AM  Result Value Ref Range Status   Specimen Description BLOOD LEFT ANTECUBITAL  Final   Special Requests   Final    BOTTLES DRAWN AEROBIC AND ANAEROBIC 10CC BLUE 5CC RED   Culture   Final           BLOOD CULTURE RECEIVED NO GROWTH TO DATE CULTURE WILL BE HELD FOR 5 DAYS BEFORE ISSUING A FINAL NEGATIVE REPORT Performed at [facility]    Report Status PENDING  Incomplete  Culture, blood (routine x 2)     Status: None (Preliminary result)   Collection Time: 12/25/14  6:19 AM  Result Value Ref Range Status   Specimen Description BLOOD RIGHT HAND  Final   Special Requests BOTTLES  DRAWN AEROBIC ONLY 10CC  Final   Culture   Final           BLOOD CULTURE RECEIVED NO GROWTH TO DATE CULTURE WILL BE HELD FOR 5 DAYS BEFORE ISSUING A FINAL NEGATIVE REPORT Performed at [facility]    Report Status PENDING  Incomplete     Studies: No results found.  Scheduled Meds: . sodium chloride  10 mL/hr Intravenous Once  . aspirin EC  81 mg Oral Daily  . atorvastatin  80 mg Oral q1800  . Chlorhexidine Gluconate Cloth  6 each Topical Q0600  . diltiazem  60 mg Oral Q12H  . feeding supplement (GLUCERNA SHAKE)  237 mL Oral QAC breakfast  . feeding supplement (PRO-STAT SUGAR FREE 64)  30 mL Oral QHS  . heparin  5,000 Units Subcutaneous 3 times per day  . insulin aspart  0-9 Units Subcutaneous TID WC  . insulin glargine  20 Units Subcutaneous QHS  . mupirocin ointment  1 application Nasal BID  . nystatin   Topical BID  . sodium chloride  3 mL Intravenous Q12H  . vancomycin  1,250 mg Intravenous Q24H   Continuous Infusions: . sodium chloride 50 mL/hr at 12/25/14 2220    Principal Problem:   Fever Active Problems:   Diabetes mellitus without complication   Hypertension   Depression   Knee fracture, left   Sepsis   Tachycardia with 141 - 160 beats per minute   Essential hypertension   Septic joint of left knee joint   Blood poisoning    Time spent: 30 minutes    Barton Dubois  [facility] Pager [phone]. If 7PM-7AM, please contact night-coverage at [email or web address], password [facility] 12/26/2014, 2:40 PM  LOS: 4 days

## 2014-12-26 NOTE — Clinical Social Work Note (Signed)
Clinical Social Work Assessment  Patient Details  Name: Laura Davenport MRN: [id number] Date of Birth: [birth year]  Date of referral:  12/26/14               Reason for consult:  Facility Placement, Discharge Planning                Permission sought to share information with:  Facility Art therapist granted to share information::  Yes, Verbal Permission Granted  Name::     Ashanna Personette  Agency::  [facility]  Relationship::  daughter, adult child  Contact Information:  [phone]  Housing/Transportation Living arrangements for the past 2 months:  [place], [place] of Information:  Patient Patient Interpreter Needed:  None Criminal Activity/Legal Involvement Pertinent to Current Situation/Hospitalization:  No - Comment as needed (Not appropriate on this admission.) Significant Relationships:  Adult Children Lives with:  Self, Facility Resident Do you feel safe going back to the place where you live?  Yes Need for family participation in patient care:  Yes (Comment) (Patient requesting CSW speak with patient's daugher, Audrea Muscat.)  Care giving concerns:  Patient expressed concern regarding CSW updating patient's daughter, Evalise Delly, regarding patient's discharge disposition. CSW has attempted to contact patient's daughter and left a message. CSW will continue to attempt to speak with patient's daughter. Patient expressed no further concerns or needs at this time.  Social Worker assessment / plan:  Patient admitted from [facility] and requesting to return at time of discharge. CSW has left message with admissions liaison for [facility].   Employment status:  Retired Nurse, adult, Medicaid In [place] PT Recommendations:  [place] / Referral to community resources:  [place]  Patient/Family's Response to care:  Patient understanding and  agreeable to CSW plan of care.   Patient/Family's Understanding of and Emotional Response to Diagnosis, Current Treatment, and Prognosis:  Patient understanding and agreeable to CSW plan of care.  Emotional Assessment Appearance:  Appears younger than stated age Attitude/Demeanor/Rapport:  Other (Patient cooperative and pleasant during assessment.) Affect (typically observed):  Accepting, Pleasant, Quiet, Calm Orientation:  Oriented to Self, Oriented to Place, Oriented to  Time, Oriented to Situation Alcohol / Substance use:  Not Applicable Psych involvement (Current and /or in the community):  No (Comment) (Not appropriate for this admission.)  Discharge Needs  Concerns to be addressed:  No discharge needs identified Readmission within the last 30 days:  Yes Current discharge risk:  None Barriers to Discharge:  No Barriers Identified   Caroline Sauger, LCSW 12/26/2014, 4:20 PM  [phone]

## 2014-12-26 NOTE — Progress Notes (Signed)
     Subjective:  POD#2 I/D of L knee. Patient reports pain as mild.  More groggy today than yesterday.  Nursing noticed a rash on the LUE yesterday and have been giving benadryl.    Objective:   VITALS:   Filed Vitals:   12/25/14 0931 12/25/14 1142 12/25/14 2031 12/26/14 0617  BP: 99/53 113/59 114/59 130/69  Pulse: 81 86 102 109  Temp: 98.6 F (37 C) 99.1 F (37.3 C) 99.3 F (37.4 C) 99 F (37.2 C)  TempSrc: Oral Oral Oral Oral  Resp: 17 18 18 18   Height:      Weight:      SpO2: 97% 98% 98% 100%    Neurologically intact, but groggy ABD soft Neurovascular intact Sensation intact distally Intact pulses distally Dorsiflexion/Plantar flexion intact Incision: dressing C/D/I Wound vac actively connected to suction on LLE New rash to the LUE  Lab Results  Component Value Date   WBC 5.7 12/26/2014   HGB 10.4* 12/26/2014   HCT 30.5* 12/26/2014   MCV 93.6 12/26/2014   PLT 294 12/26/2014   BMET    Component Value Date/Time   NA 131* 12/26/2014 0430   K 4.7 12/26/2014 0430   CL 103 12/26/2014 0430   CO2 21 12/26/2014 0430   GLUCOSE 116* 12/26/2014 0430   BUN 15 12/26/2014 0430   CREATININE 0.87 12/26/2014 0430   CALCIUM 7.7* 12/26/2014 0430   GFRNONAA 60* 12/26/2014 0430   GFRAA 70* 12/26/2014 0430     Assessment/Plan: 2 Days Post-Op   Principal Problem:   Fever Active Problems:   Diabetes mellitus without complication   Hypertension   Depression   Knee fracture, left   Sepsis   Tachycardia with 141 - 160 beats per minute   Essential hypertension   Septic joint of left knee joint   Blood poisoning   Up with therapy WBAT in the LLE DVT prophylaxis per medicine, no contraindication per ortho Plan to remove wound vac in a day and change to a sterile dressing.   Cathlin Buchan Lelan Pons 12/26/2014, 8:53 AM Cell [phone]

## 2014-12-26 NOTE — Progress Notes (Signed)
Upon changing pt when they had an incontinent spell. RN noticed skin breakdown on the sacrum. Looks to be a stagell, with a red wound bed.  About a 1inch in length, no drainage. Cleansed and applied foam dressing.

## 2014-12-27 DIAGNOSIS — A4102 Sepsis due to Methicillin resistant Staphylococcus aureus: Secondary | ICD-10-CM

## 2014-12-27 DIAGNOSIS — I4892 Unspecified atrial flutter: Secondary | ICD-10-CM

## 2014-12-27 DIAGNOSIS — I4891 Unspecified atrial fibrillation: Secondary | ICD-10-CM | POA: Insufficient documentation

## 2014-12-27 LAB — TISSUE CULTURE

## 2014-12-27 LAB — GLUCOSE, CAPILLARY
Glucose-Capillary: 140 mg/dL — ABNORMAL HIGH (ref 70–99)
Glucose-Capillary: 138 mg/dL — ABNORMAL HIGH (ref 70–99)
Glucose-Capillary: 107 mg/dL — ABNORMAL HIGH (ref 70–99)
Glucose-Capillary: 136 mg/dL — ABNORMAL HIGH (ref 70–99)

## 2014-12-27 LAB — VANCOMYCIN, TROUGH: Vancomycin Tr: 13.7 ug/mL (ref 10.0–20.0)

## 2014-12-27 MED ORDER — RIVAROXABAN 10 MG PO TABS
15.0000 mg | ORAL_TABLET | Freq: Every day | ORAL | Status: DC
  Administered 2014-12-28 (×2): 15 mg via ORAL
  Filled 2014-12-27 (×3): qty 2

## 2014-12-27 MED ORDER — DILTIAZEM HCL ER COATED BEADS 120 MG PO CP24
120.0000 mg | ORAL_CAPSULE | Freq: Every day | ORAL | Status: DC
  Administered 2014-12-28 – 2014-12-29 (×2): 120 mg via ORAL
  Filled 2014-12-27 (×3): qty 1

## 2014-12-27 MED ORDER — HYDROCORTISONE ACETATE 25 MG RE SUPP
25.0000 mg | Freq: Two times a day (BID) | RECTAL | Status: DC
  Administered 2014-12-27 – 2014-12-29 (×5): 25 mg via RECTAL
  Filled 2014-12-27 (×6): qty 1

## 2014-12-27 MED ORDER — VANCOMYCIN HCL 10 G IV SOLR
1500.0000 mg | INTRAVENOUS | Status: AC
  Administered 2014-12-27: 1500 mg via INTRAVENOUS
  Filled 2014-12-27: qty 1500

## 2014-12-27 MED ORDER — SODIUM CHLORIDE 0.9 % IV SOLN
1500.0000 mg | INTRAVENOUS | Status: DC
Start: 2014-12-28 — End: 2014-12-29
  Administered 2014-12-28 – 2014-12-29 (×2): 1500 mg via INTRAVENOUS
  Filled 2014-12-27 (×3): qty 1500

## 2014-12-27 NOTE — Progress Notes (Signed)
Upon turning pt noticed that pt was having bloody discharge from rectum. Thin bright red blood. Pt states she has never had a history of hemorrhoids. MD made aware. Will continue to monitor.

## 2014-12-27 NOTE — Progress Notes (Signed)
[place] [facility]  Date of Admission:  12/22/2014  Antibiotics: vancomycin  Subjective: No complaints, more sleepy since getting benadryl, rash noted of left arm but is improving  Objective: Temp:  [98.6 F (37 C)] 98.6 F (37 C) (04/26 0618) Pulse Rate:  [89-94] 89 (04/26 0618) Resp:  [14-20] 16 (04/26 0618) BP: (103-119)/(54-62) 119/55 mmHg (04/26 0933) SpO2:  [98 %] 98 % (04/26 0618)  General: awake, sleepy Skin: left arm with confluent erythematous rash, not warm Lungs: CTA B Cor: RRR Abdomen: soft, nt, nd Ext: left leg in wrap  Lab Results Lab Results  Component Value Date   WBC 5.7 12/26/2014   HGB 10.4* 12/26/2014   HCT 30.5* 12/26/2014   MCV 93.6 12/26/2014   PLT 294 12/26/2014    Lab Results  Component Value Date   CREATININE 0.87 12/26/2014   BUN 15 12/26/2014   NA 131* 12/26/2014   K 4.7 12/26/2014   CL 103 12/26/2014   CO2 21 12/26/2014    Lab Results  Component Value Date   ALT 19 12/23/2014   AST 22 12/23/2014   ALKPHOS 70 12/23/2014   BILITOT 1.1 12/23/2014      Microbiology: Recent Results (from the past 240 hour(s))  Culture, blood (x 2)     Status: None   Collection Time: 12/22/14  4:45 PM  Result Value Ref Range Status   Specimen Description BLOOD RIGHT ARM  Final   Special Requests BOTTLES DRAWN AEROBIC AND ANAEROBIC 5CC  Final   Culture   Final    METHICILLIN RESISTANT STAPHYLOCOCCUS AUREUS Note: RIFAMPIN AND GENTAMICIN SHOULD NOT BE USED AS SINGLE DRUGS FOR TREATMENT OF STAPH INFECTIONS. CRITICAL RESULT CALLED TO, READ BACK BY AND VERIFIED WITH: DONNA NIEMELA @ 2:27 PM 12/25/14 BY DWEEKS This organism DOES NOT demonstrate inducible  Clindamycin resistance in vitro. Note: Gram Stain Report Called to,Read Back By and Verified With: KARA NARIGON @ 10:03 AM 12/24/14 BY DWEEKS Performed at [facility]    Report Status 12/26/2014 FINAL  Final   Organism ID, Bacteria METHICILLIN RESISTANT STAPHYLOCOCCUS  AUREUS  Final      Susceptibility   Methicillin resistant staphylococcus aureus - MIC*    CLINDAMYCIN <=0.25 SENSITIVE Sensitive     ERYTHROMYCIN >=8 RESISTANT Resistant     GENTAMICIN <=0.5 SENSITIVE Sensitive     LEVOFLOXACIN >=8 RESISTANT Resistant     OXACILLIN >=4 RESISTANT Resistant     PENICILLIN >=0.5 RESISTANT Resistant     RIFAMPIN <=0.5 SENSITIVE Sensitive     TRIMETH/SULFA >=320 RESISTANT Resistant     VANCOMYCIN <=0.5 SENSITIVE Sensitive     TETRACYCLINE <=1 SENSITIVE Sensitive     * METHICILLIN RESISTANT STAPHYLOCOCCUS AUREUS  MRSA PCR Screening     Status: Abnormal   Collection Time: 12/22/14  9:51 PM  Result Value Ref Range Status   MRSA by PCR POSITIVE (A) NEGATIVE Final    Comment:        The GeneXpert MRSA Assay (FDA approved for NASAL specimens only), is one component of a comprehensive MRSA colonization surveillance program. It is not intended to diagnose MRSA infection nor to guide or monitor treatment for MRSA infections. RESULT CALLED TO, READ BACK BY AND VERIFIED WITH: ABDUSSALAAM,K RN [phone] AT 0330 SKEEN,P   Culture, blood (x 2)     Status: None (Preliminary result)   Collection Time: 12/22/14 10:37 PM  Result Value Ref Range Status   Specimen Description BLOOD LEFT ANTECUBITAL  Final  Special Requests BOTTLES DRAWN AEROBIC ONLY 10CC  Final   Culture   Final           BLOOD CULTURE RECEIVED NO GROWTH TO DATE CULTURE WILL BE HELD FOR 5 DAYS BEFORE ISSUING A FINAL NEGATIVE REPORT Performed at [facility]    Report Status PENDING  Incomplete  Anaerobic culture     Status: None (Preliminary result)   Collection Time: 12/23/14 11:11 AM  Result Value Ref Range Status   Specimen Description FLUID SYNOVIAL LEFT KNEE  Final   Special Requests NONE  Final   Gram Stain PENDING  Incomplete   Culture   Final    NO ANAEROBES ISOLATED; CULTURE IN PROGRESS FOR 5 DAYS Performed at [facility]    Report Status PENDING  Incomplete  Body  fluid culture     Status: None   Collection Time: 12/23/14 11:11 AM  Result Value Ref Range Status   Specimen Description FLUID SYNOVIAL KNEE LEFT  Final   Special Requests NONE  Final   Gram Stain   Final    ABUNDANT WBC PRESENT, PREDOMINANTLY MONONUCLEAR NO ORGANISMS SEEN Performed at [facility]    Culture   Final    FEW METHICILLIN RESISTANT STAPHYLOCOCCUS AUREUS Note: RIFAMPIN AND GENTAMICIN SHOULD NOT BE USED AS SINGLE DRUGS FOR TREATMENT OF STAPH INFECTIONS. CRITICAL RESULT CALLED TO, READ BACK BY AND VERIFIED WITH: DONNA NIEMELA @ 11:49 AM 12/25/14 BY DWEEKS This organism DOES NOT demonstrate inducible  Clindamycin resistance in vitro. Performed at [facility]    Report Status 12/26/2014 FINAL  Final   Organism ID, Bacteria METHICILLIN RESISTANT STAPHYLOCOCCUS AUREUS  Final      Susceptibility   Methicillin resistant staphylococcus aureus - MIC*    CLINDAMYCIN <=0.25 SENSITIVE Sensitive     ERYTHROMYCIN >=8 RESISTANT Resistant     GENTAMICIN <=0.5 SENSITIVE Sensitive     LEVOFLOXACIN >=8 RESISTANT Resistant     OXACILLIN >=4 RESISTANT Resistant     PENICILLIN >=0.5 RESISTANT Resistant     RIFAMPIN <=0.5 SENSITIVE Sensitive     TRIMETH/SULFA >=320 RESISTANT Resistant     VANCOMYCIN 1 SENSITIVE Sensitive     TETRACYCLINE <=1 SENSITIVE Sensitive     * FEW METHICILLIN RESISTANT STAPHYLOCOCCUS AUREUS  Anaerobic culture     Status: None (Preliminary result)   Collection Time: 12/24/14  8:20 AM  Result Value Ref Range Status   Specimen Description TISSUE LEFT KNEE  Final   Special Requests PATIENT ON FOLLOWING VANCOMYCIN ZOSYN  Final   Gram Stain PENDING  Incomplete   Culture   Final    NO ANAEROBES ISOLATED; CULTURE IN PROGRESS FOR 5 DAYS Performed at [facility]    Report Status PENDING  Incomplete  Tissue culture     Status: None   Collection Time: 12/24/14  8:20 AM  Result Value Ref Range Status   Specimen Description TISSUE LEFT KNEE  Final    Special Requests PATIENT ON FOLLOWING VANCOMYCIN ZOSYN  Final   Gram Stain   Final    ABUNDANT WBC PRESENT, PREDOMINANTLY PMN NO SQUAMOUS EPITHELIAL CELLS SEEN RARE GRAM POSITIVE COCCI IN PAIRS Performed at [facility]    Culture   Final    RARE METHICILLIN RESISTANT STAPHYLOCOCCUS AUREUS Note: RIFAMPIN AND GENTAMICIN SHOULD NOT BE USED AS SINGLE DRUGS FOR TREATMENT OF STAPH INFECTIONS. This organism DOES NOT demonstrate inducible Clindamycin resistance in vitro. CRITICAL RESULT CALLED TO, READ BACK BY AND VERIFIED WITH: DANIELLE SCOTT @  0930 ON 12/27/14 BY KENDR Performed at [facility]    Report Status 12/27/2014 FINAL  Final   Organism ID, Bacteria METHICILLIN RESISTANT STAPHYLOCOCCUS AUREUS  Final      Susceptibility   Methicillin resistant staphylococcus aureus - MIC*    CLINDAMYCIN <=0.25 SENSITIVE Sensitive     ERYTHROMYCIN >=8 RESISTANT Resistant     GENTAMICIN <=0.5 SENSITIVE Sensitive     LEVOFLOXACIN >=8 RESISTANT Resistant     OXACILLIN >=4 RESISTANT Resistant     PENICILLIN >=0.5 RESISTANT Resistant     RIFAMPIN <=0.5 SENSITIVE Sensitive     TRIMETH/SULFA >=320 RESISTANT Resistant     VANCOMYCIN 1 SENSITIVE Sensitive     TETRACYCLINE <=1 SENSITIVE Sensitive     * RARE METHICILLIN RESISTANT STAPHYLOCOCCUS AUREUS  Culture, blood (routine x 2)     Status: None (Preliminary result)   Collection Time: 12/25/14  6:15 AM  Result Value Ref Range Status   Specimen Description BLOOD LEFT ANTECUBITAL  Final   Special Requests   Final    BOTTLES DRAWN AEROBIC AND ANAEROBIC 10CC BLUE 5CC RED   Culture   Final           BLOOD CULTURE RECEIVED NO GROWTH TO DATE CULTURE WILL BE HELD FOR 5 DAYS BEFORE ISSUING A FINAL NEGATIVE REPORT Performed at [facility]    Report Status PENDING  Incomplete  Culture, blood (routine x 2)     Status: None (Preliminary result)   Collection Time: 12/25/14  6:19 AM  Result Value Ref Range Status   Specimen Description  BLOOD RIGHT HAND  Final   Special Requests BOTTLES DRAWN AEROBIC ONLY 10CC  Final   Culture   Final           BLOOD CULTURE RECEIVED NO GROWTH TO DATE CULTURE WILL BE HELD FOR 5 DAYS BEFORE ISSUING A FINAL NEGATIVE REPORT Performed at [facility]    Report Status PENDING  Incomplete    Studies/Results: No results found.  Assessment/Plan:  1) septic arthritis with MRSA - continue with vancomycin for 4 weeks through 5/21.  TTE without concerns.  TEE deferred since she will need prolonged antibiotics.   I will order picc line today We will follow up with her in our clinic in about 1 month prior to stopping to see if ok to d/c antibiotics.    Scharlene Gloss, [place] [facility] [place] [email or web address] [phone] pager   [phone] cell 12/27/2014, 10:49 AM

## 2014-12-27 NOTE — Progress Notes (Signed)
Tissue culture came back positive for rare MRSA growth @0930am . Notified attending MD @ 0935am.

## 2014-12-27 NOTE — Progress Notes (Signed)
     Subjective:  POD#3 I/D L knee with hardware removal. Patient reports pain as mild to moderate.    Objective:   VITALS:   Filed Vitals:   12/26/14 0617 12/26/14 1538 12/26/14 2004 12/27/14 0618  BP: 130/69 110/62 117/54 103/58  Pulse: 109 94 91 89  Temp: 99 F (37.2 C) 98.6 F (37 C) 98.6 F (37 C) 98.6 F (37 C)  TempSrc: Oral Oral Oral   Resp: 18 14 20 16   Height:      Weight:      SpO2: 100% 98% 98% 98%    Neurologically intact ABD soft Neurovascular intact Sensation intact distally Intact pulses distally Dorsiflexion/Plantar flexion intact Incision: dressing C/D/I Wound vac connected to suction on the LLE  Lab Results  Component Value Date   WBC 5.7 12/26/2014   HGB 10.4* 12/26/2014   HCT 30.5* 12/26/2014   MCV 93.6 12/26/2014   PLT 294 12/26/2014   BMET    Component Value Date/Time   NA 131* 12/26/2014 0430   K 4.7 12/26/2014 0430   CL 103 12/26/2014 0430   CO2 21 12/26/2014 0430   GLUCOSE 116* 12/26/2014 0430   BUN 15 12/26/2014 0430   CREATININE 0.87 12/26/2014 0430   CALCIUM 7.7* 12/26/2014 0430   GFRNONAA 60* 12/26/2014 0430   GFRAA 70* 12/26/2014 0430     Assessment/Plan: 3 Days Post-Op   Principal Problem:   Septic joint of left knee joint Active Problems:   Diabetes mellitus without complication   Hypertension   Depression   Fever   Knee fracture, left   Sepsis   Tachycardia with 141 - 160 beats per minute   Essential hypertension   Blood poisoning   AKI (acute kidney injury)   Bacteremia   Up with therapy WBAT in the LLE Continue abx per ID Removed the wound vac this morning and changed to a sterile dressing, will have the patient follow up with her orthopedic upon d/c for further care.     Laura Davenport Laura Davenport 12/27/2014, 7:04 AM Cell [phone]

## 2014-12-27 NOTE — Progress Notes (Signed)
ANTIBIOTIC CONSULT NOTE - FOLLOW UP  Pharmacy Consult for vancomycin Indication: septic arthritis w/ MRSA  Labs:  Recent Labs  12/24/14 0255 12/25/14 0615 12/26/14 0430  WBC 10.3 7.8 5.7  HGB 7.5* 9.7* 10.4*  PLT 286 288 294  CREATININE 1.05 1.04 0.87   Estimated Creatinine Clearance: 52.7 mL/min (by C-G formula based on Cr of 0.87).  Recent Labs  12/26/14 2211  VANCOTROUGH 13.7     Microbiology: Recent Results (from the past 720 hour(s))  Urine culture     Status: None   Collection Time: 12/04/14 11:05 AM  Result Value Ref Range Status   Specimen Description URINE, CLEAN CATCH  Final   Special Requests NONE  Final   Colony Count   Final    >=100,000 COLONIES/ML Performed at [facility]    Culture   Final    [place] Performed at [facility]    Report Status 12/08/2014 FINAL  Final   Organism ID, Bacteria ESCHERICHIA COLI  Final   Organism ID, Bacteria PROTEUS MIRABILIS  Final      Susceptibility   Escherichia coli - MIC*    AMPICILLIN <=2 SENSITIVE Sensitive     CEFAZOLIN <=4 SENSITIVE Sensitive     CEFTRIAXONE <=1 SENSITIVE Sensitive     CIPROFLOXACIN <=0.25 SENSITIVE Sensitive     GENTAMICIN <=1 SENSITIVE Sensitive     LEVOFLOXACIN <=0.12 SENSITIVE Sensitive     NITROFURANTOIN <=16 SENSITIVE Sensitive     TOBRAMYCIN <=1 SENSITIVE Sensitive     TRIMETH/SULFA <=20 SENSITIVE Sensitive     PIP/TAZO <=4 SENSITIVE Sensitive     * ESCHERICHIA COLI   Proteus mirabilis - MIC*    AMPICILLIN <=2 SENSITIVE Sensitive     CEFAZOLIN <=4 SENSITIVE Sensitive     CEFTRIAXONE <=1 SENSITIVE Sensitive     CIPROFLOXACIN >=4 RESISTANT Resistant     GENTAMICIN <=1 SENSITIVE Sensitive     LEVOFLOXACIN >=8 RESISTANT Resistant     NITROFURANTOIN 128 RESISTANT Resistant     TOBRAMYCIN <=1 SENSITIVE Sensitive     TRIMETH/SULFA <=20 SENSITIVE Sensitive     PIP/TAZO <=4 SENSITIVE Sensitive     * PROTEUS MIRABILIS  Culture, blood (x  2)     Status: None   Collection Time: 12/22/14  4:45 PM  Result Value Ref Range Status   Specimen Description BLOOD RIGHT ARM  Final   Special Requests BOTTLES DRAWN AEROBIC AND ANAEROBIC 5CC  Final   Culture   Final    METHICILLIN RESISTANT STAPHYLOCOCCUS AUREUS Note: RIFAMPIN AND GENTAMICIN SHOULD NOT BE USED AS SINGLE DRUGS FOR TREATMENT OF STAPH INFECTIONS. CRITICAL RESULT CALLED TO, READ BACK BY AND VERIFIED WITH: DONNA NIEMELA @ 2:27 PM 12/25/14 BY DWEEKS This organism DOES NOT demonstrate inducible  Clindamycin resistance in vitro. Note: Gram Stain Report Called to,Read Back By and Verified With: KARA NARIGON @ 10:03 AM 12/24/14 BY DWEEKS Performed at [facility]    Report Status 12/26/2014 FINAL  Final   Organism ID, Bacteria METHICILLIN RESISTANT STAPHYLOCOCCUS AUREUS  Final      Susceptibility   Methicillin resistant staphylococcus aureus - MIC*    CLINDAMYCIN <=0.25 SENSITIVE Sensitive     ERYTHROMYCIN >=8 RESISTANT Resistant     GENTAMICIN <=0.5 SENSITIVE Sensitive     LEVOFLOXACIN >=8 RESISTANT Resistant     OXACILLIN >=4 RESISTANT Resistant     PENICILLIN >=0.5 RESISTANT Resistant     RIFAMPIN <=0.5 SENSITIVE Sensitive     TRIMETH/SULFA >=320 RESISTANT  Resistant     VANCOMYCIN <=0.5 SENSITIVE Sensitive     TETRACYCLINE <=1 SENSITIVE Sensitive     * METHICILLIN RESISTANT STAPHYLOCOCCUS AUREUS  MRSA PCR Screening     Status: Abnormal   Collection Time: 12/22/14  9:51 PM  Result Value Ref Range Status   MRSA by PCR POSITIVE (A) NEGATIVE Final    Comment:        The GeneXpert MRSA Assay (FDA approved for NASAL specimens only), is one component of a comprehensive MRSA colonization surveillance program. It is not intended to diagnose MRSA infection nor to guide or monitor treatment for MRSA infections. RESULT CALLED TO, READ BACK BY AND VERIFIED WITH: ABDUSSALAAM,K RN [phone] AT 0330 SKEEN,P   Culture, blood (x 2)     Status: None (Preliminary result)    Collection Time: 12/22/14 10:37 PM  Result Value Ref Range Status   Specimen Description BLOOD LEFT ANTECUBITAL  Final   Special Requests BOTTLES DRAWN AEROBIC ONLY 10CC  Final   Culture   Final           BLOOD CULTURE RECEIVED NO GROWTH TO DATE CULTURE WILL BE HELD FOR 5 DAYS BEFORE ISSUING A FINAL NEGATIVE REPORT Performed at [facility]    Report Status PENDING  Incomplete  Anaerobic culture     Status: None (Preliminary result)   Collection Time: 12/23/14 11:11 AM  Result Value Ref Range Status   Specimen Description FLUID SYNOVIAL LEFT KNEE  Final   Special Requests NONE  Final   Gram Stain PENDING  Incomplete   Culture   Final    NO ANAEROBES ISOLATED; CULTURE IN PROGRESS FOR 5 DAYS Performed at [facility]    Report Status PENDING  Incomplete  Body fluid culture     Status: None   Collection Time: 12/23/14 11:11 AM  Result Value Ref Range Status   Specimen Description FLUID SYNOVIAL KNEE LEFT  Final   Special Requests NONE  Final   Gram Stain   Final    ABUNDANT WBC PRESENT, PREDOMINANTLY MONONUCLEAR NO ORGANISMS SEEN Performed at [facility]    Culture   Final    FEW METHICILLIN RESISTANT STAPHYLOCOCCUS AUREUS Note: RIFAMPIN AND GENTAMICIN SHOULD NOT BE USED AS SINGLE DRUGS FOR TREATMENT OF STAPH INFECTIONS. CRITICAL RESULT CALLED TO, READ BACK BY AND VERIFIED WITH: DONNA NIEMELA @ 11:49 AM 12/25/14 BY DWEEKS This organism DOES NOT demonstrate inducible  Clindamycin resistance in vitro. Performed at [facility]    Report Status 12/26/2014 FINAL  Final   Organism ID, Bacteria METHICILLIN RESISTANT STAPHYLOCOCCUS AUREUS  Final      Susceptibility   Methicillin resistant staphylococcus aureus - MIC*    CLINDAMYCIN <=0.25 SENSITIVE Sensitive     ERYTHROMYCIN >=8 RESISTANT Resistant     GENTAMICIN <=0.5 SENSITIVE Sensitive     LEVOFLOXACIN >=8 RESISTANT Resistant     OXACILLIN >=4 RESISTANT Resistant     PENICILLIN >=0.5 RESISTANT  Resistant     RIFAMPIN <=0.5 SENSITIVE Sensitive     TRIMETH/SULFA >=320 RESISTANT Resistant     VANCOMYCIN 1 SENSITIVE Sensitive     TETRACYCLINE <=1 SENSITIVE Sensitive     * FEW METHICILLIN RESISTANT STAPHYLOCOCCUS AUREUS  Anaerobic culture     Status: None (Preliminary result)   Collection Time: 12/24/14  8:20 AM  Result Value Ref Range Status   Specimen Description TISSUE LEFT KNEE  Final   Special Requests PATIENT ON FOLLOWING VANCOMYCIN ZOSYN  Final   Gram Stain PENDING  Incomplete   Culture   Final    NO ANAEROBES ISOLATED; CULTURE IN PROGRESS FOR 5 DAYS Performed at [facility]    Report Status PENDING  Incomplete  Tissue culture     Status: None (Preliminary result)   Collection Time: 12/24/14  8:20 AM  Result Value Ref Range Status   Specimen Description TISSUE LEFT KNEE  Final   Special Requests PATIENT ON FOLLOWING VANCOMYCIN ZOSYN  Final   Gram Stain   Final    ABUNDANT WBC PRESENT, PREDOMINANTLY PMN NO SQUAMOUS EPITHELIAL CELLS SEEN RARE GRAM POSITIVE COCCI IN PAIRS Performed at [facility]    Culture   Final    RARE STAPHYLOCOCCUS AUREUS Note: RIFAMPIN AND GENTAMICIN SHOULD NOT BE USED AS SINGLE DRUGS FOR TREATMENT OF STAPH INFECTIONS. Performed at [facility]    Report Status PENDING  Incomplete  Culture, blood (routine x 2)     Status: None (Preliminary result)   Collection Time: 12/25/14  6:15 AM  Result Value Ref Range Status   Specimen Description BLOOD LEFT ANTECUBITAL  Final   Special Requests   Final    BOTTLES DRAWN AEROBIC AND ANAEROBIC 10CC BLUE 5CC RED   Culture   Final           BLOOD CULTURE RECEIVED NO GROWTH TO DATE CULTURE WILL BE HELD FOR 5 DAYS BEFORE ISSUING A FINAL NEGATIVE REPORT Performed at [facility]    Report Status PENDING  Incomplete  Culture, blood (routine x 2)     Status: None (Preliminary result)   Collection Time: 12/25/14  6:19 AM  Result Value Ref Range Status   Specimen Description  BLOOD RIGHT HAND  Final   Special Requests BOTTLES DRAWN AEROBIC ONLY 10CC  Final   Culture   Final           BLOOD CULTURE RECEIVED NO GROWTH TO DATE CULTURE WILL BE HELD FOR 5 DAYS BEFORE ISSUING A FINAL NEGATIVE REPORT Performed at [facility]    Report Status PENDING  Incomplete     Assessment: 81yo female subtherapeutic on vancomycin with initial dosing for septic arthritis w/ MRSA.  Goal of Therapy:  Vancomycin trough level 15-20 mcg/ml  Plan:  Will increase vancomycin to 1500mg  IV Q24H for calculated trough ~16 and continue to monitor; plan for 4wk of vanc tx.  Wynona Neat, PharmD, BCPS  12/27/2014,1:00 AM

## 2014-12-27 NOTE — Progress Notes (Signed)
[facility] PROGRESS NOTE  LAURON OFFENBACHER [id number] DOB: 1930/12/14 DOA: 12/22/2014 PCP: [name], MD  Brief hx Laura Davenport is a 81 y.o. female with past medical history of hypertension, hyperlipidemia, diabetes mellitus, depression, chronic kidney disease-stage III, s/p of left patellar fracture surgery, who presents with left knee swelling, pain and fever. Found to have sepsis due to MRSA left septic joint and bacteremia. Patient status post hardware removal and knee wash out. Per ID to treat for 4 weeks with IV vancomycin. Waiting PICC line; back to SNF for treatment and rehabilitation. Patient developed PAF with sepsis; cardiology curbside and given elevated CHASDVASC, they recommended to use anticoagulation and maintain lowest dose of cardizem need for rate control. Patient in sinus rhythm now.  Assessment/Plan: sepsis: Appears to be secondary to left knee septic joint; patient with 1/2 positive blood cx's for MRSA nd also positive joint aspiration culture positive for MRSA -Fluid aspirated has been sent for culture and results are pending -1/2 blood cultures positive for MRSA and positive joint aspiration cx's for MRSA -Will continue vancomycin as per infectious disease recommendations (four weeks of treatment) -Taken to the OR on 12/24/2014; has received arthroscopy and knee washout with hardware removal -Will continue prn analgesics and antipyretics -Will follow ID recommendations regarding which antibiotic, for how long and route of administration based on culture results. -no valve abnormalities appreciated on TTE -patient now non-toxic and feeling better -no fever and WBC's WNL -will place PICC  Tachycardia/PAF: Heart rate 148, indicating presumed flutter with 2:1 block. -trop is slightly elevated at 0.04 and 0.06; most likely demand ischemia from ongoing tachycardia -patient w/o CP and no SOB -2-D echo demonstrating no wall motion abnormalities, preserved EF and  just grade 1 diastolic heart failure. -Continue oral cardizem and adjust as needed; per cardiology rec's, plan is to use the lowest amount needed for rate control. Will change to 120mg  daily and monitor. -Continue ASA -continue heart healthy diet -Chadsvasc score 5; currently on heparin -ok per ortho to start anticoagulation. Will use xarelto, which would protect for PAF and DVT prophylaxis as well. Discussed with pharmacy and given renal function will be better choice to use xarelto and guarantee protection for both conditions.  AoCKD-III: In the setting of sepsis and continue use of nephrotoxic agents -Renal ultrasound without hydronephrosis or obstruction -Continue holding losartan and HCTZ for now -[facility]F's at [facility] -Creatinine back to normal  HTN: Blood pressure is stable and improving -Will continue holding home antihypertensive drugs  -Will continue cardizem PO now -Continue prn hydralazine -will continue [facility]'s at [facility]  Diabetes mellitus: No A1C on record. Patient is on Lantus 30 units daily  -continue decrease Lantus dose of 20 units; CBG is stable with current regimen -sliding scale insulin  -A1c 6.4   Hyperlipidemia: Patient is on Lipitor at home. No LDL record. -Continue Lipitor  Blood cultures 1/2 positive for MRSA and positive joint aspiration cultures positive for MRSA -patient on vancomycin -repeated blood cx's on 4/24 ordered; pending if neg >48 hours will place PICC -per ID recommendations will treat with vancomycin for 4 weeks (starting to count day after surgery); she will finish treatment on 01/21/15 -second set of blood cx's no growth > 48 hours now -will place PICC  BRPB -most likely hemorrhoids -will start Anusol -Hgb stable -will follow for any further episode of bleeding   Code Status: Full code Family Communication: No family at bedside Disposition Plan: Remains inpatient; but will transfer to telemetry bed   Consultants:  Edmonia Lynch  (orthopedic service)  Scharlene Gloss (infectious disease)  Procedures:  See below for x-ray reports  Arthroscopy and washout of her left knee with hardware removal   4/23  2-D echo: - Left ventricle: The cavity size was normal. Wall thickness was increased in a pattern of moderate LVH. Systolic function was normal. The estimated ejection fraction was in the range of 55% to 60%. Wall motion was normal; there were no regional wall motion abnormalities. Doppler parameters are consistent with abnormal left ventricular relaxation (grade 1 diastolic dysfunction). - Mitral valve: There was mild regurgitation.  Antibiotics:  Vancomycin 4/22  Cefepime 4/22>>4/23  HPI/Subjective: Afebrile, no chest pain or shortness of breath. Positive mild transient episode of streak BRPBR (consistent with hemorrhoids most likely). No abd pain, no diarrhea.  Objective: Filed Vitals:   12/27/14 2059  BP: 105/51  Pulse: 84  Temp: 98.3 F (36.8 C)  Resp: 17    Intake/Output Summary (Last 24 hours) at 12/27/14 2156 Last data filed at 12/27/14 2115  Gross per 24 hour  Intake    323 ml  Output      0 ml  Net    323 ml   Filed Weights   12/22/14 2123 12/22/14 2200  Weight: 88.6 kg (195 lb 5.2 oz) 88.6 kg (195 lb 5.2 oz)    Exam:  General:  Afebrile; no CP, no nausea, no vomiting and no shortness of breath. Status post left knee arthroscopy and washout with hardware removal on 4/23. Patient with very minimal strike of BRBPR episode (consistent with Hemorrhoids most likely) Cardiovascular: Regular rate, positive murmur, no rubs, no gallops; no JVD Respiratory: Good air movement, no wheezing, no crackles Abdomen: Soft, nontender, nondistended, positive bowel sounds Musculoskeletal: Left leg with dressing and drain in place. Per patient pain 3/10; no swelling. Bledsoe immobilizer in place  Data Reviewed: Basic Metabolic Panel:  Recent Labs Lab 12/22/14 1742 12/23/14 0310  12/24/14 0255 12/25/14 0615 12/26/14 0430  NA 129* 133* 134* 134* 131*  K 4.3 3.6 3.2* 5.2* 4.7  CL 94* 103 103 106 103  CO2 25 21 22 22 21   GLUCOSE 225* 131* 93 84 116*  BUN 30* 26* 17 18 15   CREATININE 1.59* 1.14* 1.05 1.04 0.87  CALCIUM 8.4 7.8* 7.8* 7.9* 7.7*   Liver Function Tests:  Recent Labs Lab 12/23/14 0310  AST 22  ALT 19  ALKPHOS 70  BILITOT 1.1  PROT 6.7  ALBUMIN 1.9*   CBC:  Recent Labs Lab 12/22/14 1742 12/23/14 0310 12/24/14 0255 12/25/14 0615 12/26/14 0430  WBC 12.8* 15.0* 10.3 7.8 5.7  NEUTROABS 11.2*  --   --   --   --   HGB 9.1* 7.7* 7.5* 9.7* 10.4*  HCT 26.6* 23.6* 23.0* 27.9* 30.5*  MCV 89.3 87.4 87.1 91.8 93.6  PLT 354 305 286 288 294   Cardiac Enzymes:  Recent Labs Lab 12/22/14 2237 12/23/14 0310 12/23/14 1219  TROPONINI 0.03 0.04* 0.06*   BNP (last 3 results)  Recent Labs  12/23/14 0310  BNP 154.3*   CBG:  Recent Labs Lab 12/26/14 1702 12/26/14 2120 12/27/14 0616 12/27/14 1244 12/27/14 1655  GLUCAP 85 116* 140* 138* 107*    Recent Results (from the past 240 hour(s))  Culture, blood (x 2)     Status: None   Collection Time: 12/22/14  4:45 PM  Result Value Ref Range Status   Specimen Description BLOOD RIGHT ARM  Final   Special Requests BOTTLES DRAWN AEROBIC AND ANAEROBIC  5CC  Final   Culture   Final    METHICILLIN RESISTANT STAPHYLOCOCCUS AUREUS Note: RIFAMPIN AND GENTAMICIN SHOULD NOT BE USED AS SINGLE DRUGS FOR TREATMENT OF STAPH INFECTIONS. CRITICAL RESULT CALLED TO, READ BACK BY AND VERIFIED WITH: DONNA NIEMELA @ 2:27 PM 12/25/14 BY DWEEKS This organism DOES NOT demonstrate inducible  Clindamycin resistance in vitro. Note: Gram Stain Report Called to,Read Back By and Verified With: KARA NARIGON @ 10:03 AM 12/24/14 BY DWEEKS Performed at [facility]    Report Status 12/26/2014 FINAL  Final   Organism ID, Bacteria METHICILLIN RESISTANT STAPHYLOCOCCUS AUREUS  Final      Susceptibility   Methicillin  resistant staphylococcus aureus - MIC*    CLINDAMYCIN <=0.25 SENSITIVE Sensitive     ERYTHROMYCIN >=8 RESISTANT Resistant     GENTAMICIN <=0.5 SENSITIVE Sensitive     LEVOFLOXACIN >=8 RESISTANT Resistant     OXACILLIN >=4 RESISTANT Resistant     PENICILLIN >=0.5 RESISTANT Resistant     RIFAMPIN <=0.5 SENSITIVE Sensitive     TRIMETH/SULFA >=320 RESISTANT Resistant     VANCOMYCIN <=0.5 SENSITIVE Sensitive     TETRACYCLINE <=1 SENSITIVE Sensitive     * METHICILLIN RESISTANT STAPHYLOCOCCUS AUREUS  MRSA PCR Screening     Status: Abnormal   Collection Time: 12/22/14  9:51 PM  Result Value Ref Range Status   MRSA by PCR POSITIVE (A) NEGATIVE Final    Comment:        The GeneXpert MRSA Assay (FDA approved for NASAL specimens only), is one component of a comprehensive MRSA colonization surveillance program. It is not intended to diagnose MRSA infection nor to guide or monitor treatment for MRSA infections. RESULT CALLED TO, READ BACK BY AND VERIFIED WITH: ABDUSSALAAM,K RN [phone] AT 0330 SKEEN,P   Culture, blood (x 2)     Status: None (Preliminary result)   Collection Time: 12/22/14 10:37 PM  Result Value Ref Range Status   Specimen Description BLOOD LEFT ANTECUBITAL  Final   Special Requests BOTTLES DRAWN AEROBIC ONLY 10CC  Final   Culture   Final           BLOOD CULTURE RECEIVED NO GROWTH TO DATE CULTURE WILL BE HELD FOR 5 DAYS BEFORE ISSUING A FINAL NEGATIVE REPORT Performed at [facility]    Report Status PENDING  Incomplete  Anaerobic culture     Status: None (Preliminary result)   Collection Time: 12/23/14 11:11 AM  Result Value Ref Range Status   Specimen Description FLUID SYNOVIAL LEFT KNEE  Final   Special Requests NONE  Final   Gram Stain PENDING  Incomplete   Culture   Final    NO ANAEROBES ISOLATED; CULTURE IN PROGRESS FOR 5 DAYS Performed at [facility]    Report Status PENDING  Incomplete  Body fluid culture     Status: None   Collection Time:  12/23/14 11:11 AM  Result Value Ref Range Status   Specimen Description FLUID SYNOVIAL KNEE LEFT  Final   Special Requests NONE  Final   Gram Stain   Final    ABUNDANT WBC PRESENT, PREDOMINANTLY MONONUCLEAR NO ORGANISMS SEEN Performed at [facility]    Culture   Final    FEW METHICILLIN RESISTANT STAPHYLOCOCCUS AUREUS Note: RIFAMPIN AND GENTAMICIN SHOULD NOT BE USED AS SINGLE DRUGS FOR TREATMENT OF STAPH INFECTIONS. CRITICAL RESULT CALLED TO, READ BACK BY AND VERIFIED WITH: DONNA NIEMELA @ 11:49 AM 12/25/14 BY DWEEKS This organism DOES NOT demonstrate inducible  Clindamycin  resistance in vitro. Performed at [facility]    Report Status 12/26/2014 FINAL  Final   Organism ID, Bacteria METHICILLIN RESISTANT STAPHYLOCOCCUS AUREUS  Final      Susceptibility   Methicillin resistant staphylococcus aureus - MIC*    CLINDAMYCIN <=0.25 SENSITIVE Sensitive     ERYTHROMYCIN >=8 RESISTANT Resistant     GENTAMICIN <=0.5 SENSITIVE Sensitive     LEVOFLOXACIN >=8 RESISTANT Resistant     OXACILLIN >=4 RESISTANT Resistant     PENICILLIN >=0.5 RESISTANT Resistant     RIFAMPIN <=0.5 SENSITIVE Sensitive     TRIMETH/SULFA >=320 RESISTANT Resistant     VANCOMYCIN 1 SENSITIVE Sensitive     TETRACYCLINE <=1 SENSITIVE Sensitive     * FEW METHICILLIN RESISTANT STAPHYLOCOCCUS AUREUS  Anaerobic culture     Status: None (Preliminary result)   Collection Time: 12/24/14  8:20 AM  Result Value Ref Range Status   Specimen Description TISSUE LEFT KNEE  Final   Special Requests PATIENT ON FOLLOWING VANCOMYCIN ZOSYN  Final   Gram Stain PENDING  Incomplete   Culture   Final    NO ANAEROBES ISOLATED; CULTURE IN PROGRESS FOR 5 DAYS Performed at [facility]    Report Status PENDING  Incomplete  Tissue culture     Status: None   Collection Time: 12/24/14  8:20 AM  Result Value Ref Range Status   Specimen Description TISSUE LEFT KNEE  Final   Special Requests PATIENT ON FOLLOWING  VANCOMYCIN ZOSYN  Final   Gram Stain   Final    ABUNDANT WBC PRESENT, PREDOMINANTLY PMN NO SQUAMOUS EPITHELIAL CELLS SEEN RARE GRAM POSITIVE COCCI IN PAIRS Performed at [facility]    Culture   Final    RARE METHICILLIN RESISTANT STAPHYLOCOCCUS AUREUS Note: RIFAMPIN AND GENTAMICIN SHOULD NOT BE USED AS SINGLE DRUGS FOR TREATMENT OF STAPH INFECTIONS. This organism DOES NOT demonstrate inducible Clindamycin resistance in vitro. CRITICAL RESULT CALLED TO, READ BACK BY AND VERIFIED WITH: DANIELLE SCOTT @  0930 ON 12/27/14 BY KENDR Performed at [facility]    Report Status 12/27/2014 FINAL  Final   Organism ID, Bacteria METHICILLIN RESISTANT STAPHYLOCOCCUS AUREUS  Final      Susceptibility   Methicillin resistant staphylococcus aureus - MIC*    CLINDAMYCIN <=0.25 SENSITIVE Sensitive     ERYTHROMYCIN >=8 RESISTANT Resistant     GENTAMICIN <=0.5 SENSITIVE Sensitive     LEVOFLOXACIN >=8 RESISTANT Resistant     OXACILLIN >=4 RESISTANT Resistant     PENICILLIN >=0.5 RESISTANT Resistant     RIFAMPIN <=0.5 SENSITIVE Sensitive     TRIMETH/SULFA >=320 RESISTANT Resistant     VANCOMYCIN 1 SENSITIVE Sensitive     TETRACYCLINE <=1 SENSITIVE Sensitive     * RARE METHICILLIN RESISTANT STAPHYLOCOCCUS AUREUS  Culture, blood (routine x 2)     Status: None (Preliminary result)   Collection Time: 12/25/14  6:15 AM  Result Value Ref Range Status   Specimen Description BLOOD LEFT ANTECUBITAL  Final   Special Requests   Final    BOTTLES DRAWN AEROBIC AND ANAEROBIC 10CC BLUE 5CC RED   Culture   Final           BLOOD CULTURE RECEIVED NO GROWTH TO DATE CULTURE WILL BE HELD FOR 5 DAYS BEFORE ISSUING A FINAL NEGATIVE REPORT Performed at [facility]    Report Status PENDING  Incomplete  Culture, blood (routine x 2)     Status: None (Preliminary result)   Collection Time:  12/25/14  6:19 AM  Result Value Ref Range Status   Specimen Description BLOOD RIGHT HAND  Final   Special  Requests BOTTLES DRAWN AEROBIC ONLY 10CC  Final   Culture   Final           BLOOD CULTURE RECEIVED NO GROWTH TO DATE CULTURE WILL BE HELD FOR 5 DAYS BEFORE ISSUING A FINAL NEGATIVE REPORT Performed at [facility]    Report Status PENDING  Incomplete     Studies: No results found.  Scheduled Meds: . sodium chloride  10 mL/hr Intravenous Once  . aspirin EC  81 mg Oral Daily  . atorvastatin  80 mg Oral q1800  . diltiazem  60 mg Oral Q12H  . feeding supplement (GLUCERNA SHAKE)  237 mL Oral QAC breakfast  . feeding supplement (PRO-STAT SUGAR FREE 64)  30 mL Oral QHS  . hydrocortisone  25 mg Rectal BID  . insulin aspart  0-9 Units Subcutaneous TID WC  . insulin glargine  20 Units Subcutaneous QHS  . nystatin   Topical BID  . rivaroxaban  15 mg Oral Q supper  . sodium chloride  3 mL Intravenous Q12H  . [START ON 12/28/2014] vancomycin  1,500 mg Intravenous Q24H   Continuous Infusions: . sodium chloride 20 mL/hr at 12/27/14 0147    Principal Problem:   Septic joint of left knee joint Active Problems:   Diabetes mellitus without complication   Hypertension   Depression   Fever   Knee fracture, left   Sepsis   Tachycardia with 141 - 160 beats per minute   Essential hypertension   Blood poisoning   AKI (acute kidney injury)   Bacteremia    Time spent: 30 minutes    Barton Dubois  [facility] Pager [phone]. If 7PM-7AM, please contact night-coverage at [email or web address], password [facility] 12/27/2014, 9:56 PM  LOS: 5 days

## 2014-12-28 LAB — BASIC METABOLIC PANEL
Anion gap: 7 (ref 5–15)
BUN: 20 mg/dL (ref 6–23)
CO2: 20 mmol/L (ref 19–32)
Calcium: 7.8 mg/dL — ABNORMAL LOW (ref 8.4–10.5)
Chloride: 106 mmol/L (ref 96–112)
Creatinine, Ser: 1.28 mg/dL — ABNORMAL HIGH (ref 0.50–1.10)
GFR calc Af Amer: 44 mL/min — ABNORMAL LOW (ref >90)
GFR calc non Af Amer: 38 mL/min — ABNORMAL LOW (ref >90)
Glucose, Bld: 96 mg/dL (ref 70–99)
Potassium: 4.4 mmol/L (ref 3.5–5.1)
Sodium: 133 mmol/L — ABNORMAL LOW (ref 135–145)

## 2014-12-28 LAB — CBC
HCT: 28.8 % — ABNORMAL LOW (ref 36.0–46.0)
Hemoglobin: 9.9 g/dL — ABNORMAL LOW (ref 12.0–15.0)
MCH: 31.6 pg (ref 26.0–34.0)
MCHC: 34.4 g/dL (ref 30.0–36.0)
MCV: 92 fL (ref 78.0–100.0)
Platelets: 321 10*3/uL (ref 150–400)
RBC: 3.13 MIL/uL — ABNORMAL LOW (ref 3.87–5.11)
RDW: 14.8 % (ref 11.5–15.5)
WBC: 6.5 10*3/uL (ref 4.0–10.5)

## 2014-12-28 LAB — GLUCOSE, CAPILLARY
Glucose-Capillary: 91 mg/dL (ref 70–99)
Glucose-Capillary: 92 mg/dL (ref 70–99)
Glucose-Capillary: 115 mg/dL — ABNORMAL HIGH (ref 70–99)

## 2014-12-28 MED ORDER — SODIUM CHLORIDE 0.9 % IV SOLN
INTRAVENOUS | Status: DC
  Administered 2014-12-28 (×2): via INTRAVENOUS

## 2014-12-28 MED ORDER — OFF THE BEAT BOOK
Freq: Once | Status: AC
Start: 2014-12-28 — End: 2014-12-28
  Administered 2014-12-28: 18:00:00
  Filled 2014-12-28: qty 1

## 2014-12-28 NOTE — Clinical Social Work Note (Signed)
Patient to discharge to [facility] when medically stable for discharge.  Lubertha Sayres, [place] Cell: [phone]       Fax: [phone] Clinical Social Work: Orthopedics [phone]) and Surgical [phone])

## 2014-12-28 NOTE — Progress Notes (Signed)
     Subjective:  POD#4 I/D of L knee with hardware removal. Patient reports pain as mild.  Resting comfortably in bed.   Objective:   VITALS:   Filed Vitals:   12/27/14 0933 12/27/14 1248 12/27/14 2059 12/28/14 0440  BP: 119/55 122/60 105/51 128/63  Pulse:  86 84 98  Temp:  99.6 F (37.6 C) 98.3 F (36.8 C) 98.7 F (37.1 C)  TempSrc:  Oral Oral Oral  Resp:  18 17 17   Height:      Weight:      SpO2:  98% 98% 100%    Neurologically intact ABD soft Neurovascular intact Sensation intact distally Intact pulses distally Dorsiflexion/Plantar flexion intact Incision: dressing C/D/I L knee in knee immobilizer  Lab Results  Component Value Date   WBC 6.5 12/28/2014   HGB 9.9* 12/28/2014   HCT 28.8* 12/28/2014   MCV 92.0 12/28/2014   PLT 321 12/28/2014   BMET    Component Value Date/Time   NA 133* 12/28/2014 0531   K 4.4 12/28/2014 0531   CL 106 12/28/2014 0531   CO2 20 12/28/2014 0531   GLUCOSE 96 12/28/2014 0531   BUN 20 12/28/2014 0531   CREATININE 1.28* 12/28/2014 0531   CALCIUM 7.8* 12/28/2014 0531   GFRNONAA 38* 12/28/2014 0531   GFRAA 44* 12/28/2014 0531     Assessment/Plan: 4 Days Post-Op   Principal Problem:   Septic joint of left knee joint Active Problems:   Diabetes mellitus without complication   Hypertension   Depression   Fever   Knee fracture, left   Sepsis   Tachycardia with 141 - 160 beats per minute   Essential hypertension   Blood poisoning   AKI (acute kidney injury)   Bacteremia   Atrial fibrillation and flutter   Up with therapy WBAT in the LLE Continue Abx per ID, awaiting pic line placement OK to discharge to SNF once cleared by medicine.  Will sign off and follow up as outpatient with Dr. Case.  Please call with any questions or concerns  Arzella Rehmann Lelan Pons 12/28/2014, 8:09 AM Cell [phone]

## 2014-12-28 NOTE — Discharge Instructions (Signed)
WBAT in the knee immobilizer  Keep dressing clean and dry till follow    Information on my medicine - XARELTO (Rivaroxaban)  This medication education was reviewed with me or my healthcare representative as part of my discharge preparation.  The pharmacist that spoke with me during my hospital stay was:  Lavenia Atlas, [facility]  Why was Xarelto prescribed for you? Xarelto was prescribed for you to reduce the risk of a blood clot forming that can cause a stroke if you have a medical condition called atrial fibrillation (a type of irregular heartbeat).  What do you need to know about xarelto ? Take your Xarelto ONCE DAILY at the same time every day with your evening meal. If you have difficulty swallowing the tablet whole, you may crush it and mix in applesauce just prior to taking your dose.  Take Xarelto exactly as prescribed by your doctor and DO NOT stop taking Xarelto without talking to the doctor who prescribed the medication.  Stopping without other stroke prevention medication to take the place of Xarelto may increase your risk of developing a clot that causes a stroke.  Refill your prescription before you run out.  After discharge, you should have regular check-up appointments with your healthcare provider that is prescribing your Xarelto.  In the future your dose may need to be changed if your kidney function or weight changes by a significant amount.  What do you do if you miss a dose? If you are taking Xarelto ONCE DAILY and you miss a dose, take it as soon as you remember on the same day then continue your regularly scheduled once daily regimen the next day. Do not take two doses of Xarelto at the same time or on the same day.   Important Safety Information A possible side effect of Xarelto is bleeding. You should call your healthcare provider right away if you experience any of the following: ? Bleeding from an injury or your nose that does not stop. ? Unusual  colored urine (red or dark brown) or unusual colored stools (red or black). ? Unusual bruising for unknown reasons. ? A serious fall or if you hit your head (even if there is no bleeding).  Some medicines may interact with Xarelto and might increase your risk of bleeding while on Xarelto. To help avoid this, consult your healthcare provider or pharmacist prior to using any new prescription or non-prescription medications, including herbals, vitamins, non-steroidal anti-inflammatory drugs (NSAIDs) and supplements.  This website has more information on Xarelto: [email or web address]

## 2014-12-28 NOTE — Progress Notes (Signed)
[facility] PROGRESS NOTE  Laura Davenport [id number] DOB: 1931-03-26 DOA: 12/22/2014 PCP: [name], MD  Brief hx Laura Davenport is a 81 y.o. female with past medical history of hypertension, hyperlipidemia, diabetes mellitus, depression, chronic kidney disease-stage III, s/p of left patellar fracture surgery, who presents with left knee swelling, pain and fever. Found to have sepsis due to MRSA left septic joint and bacteremia. Patient status post hardware removal and knee wash out. Per ID to treat for 4 weeks with IV vancomycin. Waiting PICC line; back to SNF for treatment and rehabilitation. Patient developed PAF with sepsis; cardiology curbside and given elevated CHASDVASC, they recommended to use anticoagulation and maintain lowest dose of cardizem need for rate control. Patient in sinus rhythm now. Patient yet to get pICC line  Assessment/Plan: sepsis: Appears to be secondary to left knee septic joint; patient with 1/2 positive blood cx's for MRSA nd also positive joint aspiration culture positive for MRSA -Fluid aspirated has been sent for culture and results are pending -1/2 blood cultures positive for MRSA and positive joint aspiration cx's for MRSA -Will continue vancomycin as per infectious disease recommendations (four weeks of treatment) -Taken to the OR on 12/24/2014; has received arthroscopy and knee washout with hardware removal -Will continue prn analgesics and antipyretics -Will follow ID recommendations regarding which antibiotic, for how long and route of administration based on culture results. -no valve abnormalities appreciated on TTE -patient now non-toxic and feeling better -no fever and WBC's WNL - PICC line to be placed.   Tachycardia/PAF: resolved -trop is slightly elevated at 0.04 and 0.06; most likely demand ischemia from ongoing tachycardia -patient w/o CP and no SOB -2-D echo demonstrating no wall motion abnormalities, preserved EF and just grade 1  diastolic heart failure. -Continue oral cardizem and adjust as needed; -Continue ASA -continue heart healthy diet -Chadsvasc score 5; currently on heparin -ok per ortho to start anticoagulation. Will use xarelto, which would protect for PAF and DVT prophylaxis as well. Discussed with pharmacy and given renal function will be better choice to use xarelto and guarantee protection for both conditions.  AoCKD-III: In the setting of sepsis and continue use of nephrotoxic agents -Renal ultrasound without hydronephrosis or obstruction -Continue holding losartan and HCTZ for now - creatinine is 1.2 today. Plan to hydrate and repeat creatinine in am.   HTN: Blood pressure is stable and improving -Will continue holding home antihypertensive drugs  -Will continue cardizem PO now -Continue prn hydralazine -will continue IVF's  Diabetes mellitus: No A1C on record. Patient is on Lantus 30 units daily  -continue decrease Lantus dose of 20 units; CBG is stable with current regimen -sliding scale insulin  -A1c 6.4   Hyperlipidemia: Patient is on Lipitor at home. No LDL record. -Continue Lipitor  Blood cultures 1/2 positive for MRSA and positive joint aspiration cultures positive for MRSA -patient on vancomycin -repeated blood cx's on 4/24 ordered; pending if neg >48 hours will place PICC -per ID recommendations will treat with vancomycin for 4 weeks (starting to count day after surgery); she will finish treatment on 01/21/15 -second set of blood cx's no growth > 48 hours now -will place PICC  BRPB -most likely hemorrhoids -will start Anusol -Hgb stable -will follow for any further episode of bleeding   Code Status: Full code Family Communication: No family at bedside Disposition Plan: possible d/c in am.    Consultants:  Edmonia Lynch (orthopedic service)  Scharlene Gloss (infectious disease)  Procedures:  See below for x-ray  reports  Arthroscopy and washout of her left knee with  hardware removal   4/23  2-D echo: - Left ventricle: The cavity size was normal. Wall thickness was increased in a pattern of moderate LVH. Systolic function was normal. The estimated ejection fraction was in the range of 55% to 60%. Wall motion was normal; there were no regional wall motion abnormalities. Doppler parameters are consistent with abnormal left ventricular relaxation (grade 1 diastolic dysfunction). - Mitral valve: There was mild regurgitation.  Antibiotics:  Vancomycin 4/22  Cefepime 4/22>>4/23  HPI/Subjective: Afebrile, no chest pain or shortness of breath. Plan for d/c in am.   Objective: Filed Vitals:   12/28/14 1236  BP: 126/70  Pulse: 102  Temp: 98.1 F (36.7 C)  Resp: 18    Intake/Output Summary (Last 24 hours) at 12/28/14 1806 Last data filed at 12/28/14 0800  Gross per 24 hour  Intake    853 ml  Output      0 ml  Net    853 ml   Filed Weights   12/22/14 2123 12/22/14 2200  Weight: 88.6 kg (195 lb 5.2 oz) 88.6 kg (195 lb 5.2 oz)    Exam:  General:  Afebrile; no CP, no nausea, no vomiting and no shortness of breath. Status post left knee arthroscopy and washout with hardware removal on 4/23. Patient with very minimal strike of BRBPR episode (consistent with Hemorrhoids most likely) Cardiovascular: Regular rate, positive murmur, no rubs, no gallops; no JVD Respiratory: Good air movement, no wheezing, no crackles Abdomen: Soft, nontender, nondistended, positive bowel sounds Musculoskeletal: Left leg with dressing and drain in place. Per patient pain 3/10; no swelling. Bledsoe immobilizer in place  Data Reviewed: Basic Metabolic Panel:  Recent Labs Lab 12/23/14 0310 12/24/14 0255 12/25/14 0615 12/26/14 0430 12/28/14 0531  NA 133* 134* 134* 131* 133*  K 3.6 3.2* 5.2* 4.7 4.4  CL 103 103 106 103 106  CO2 21 22 22 21 20   GLUCOSE 131* 93 84 116* 96  BUN 26* 17 18 15 20   CREATININE 1.14* 1.05 1.04 0.87 1.28*  CALCIUM 7.8*  7.8* 7.9* 7.7* 7.8*   Liver Function Tests:  Recent Labs Lab 12/23/14 0310  AST 22  ALT 19  ALKPHOS 70  BILITOT 1.1  PROT 6.7  ALBUMIN 1.9*   CBC:  Recent Labs Lab 12/22/14 1742 12/23/14 0310 12/24/14 0255 12/25/14 0615 12/26/14 0430 12/28/14 0531  WBC 12.8* 15.0* 10.3 7.8 5.7 6.5  NEUTROABS 11.2*  --   --   --   --   --   HGB 9.1* 7.7* 7.5* 9.7* 10.4* 9.9*  HCT 26.6* 23.6* 23.0* 27.9* 30.5* 28.8*  MCV 89.3 87.4 87.1 91.8 93.6 92.0  PLT 354 305 286 288 294 321   Cardiac Enzymes:  Recent Labs Lab 12/22/14 2237 12/23/14 0310 12/23/14 1219  TROPONINI 0.03 0.04* 0.06*   BNP (last 3 results)  Recent Labs  12/23/14 0310  BNP 154.3*   CBG:  Recent Labs Lab 12/27/14 1655 12/27/14 2159 12/28/14 0538 12/28/14 1235 12/28/14 1630  GLUCAP 107* 136* 91 92 115*    Recent Results (from the past 240 hour(s))  Culture, blood (x 2)     Status: None   Collection Time: 12/22/14  4:45 PM  Result Value Ref Range Status   Specimen Description BLOOD RIGHT ARM  Final   Special Requests BOTTLES DRAWN AEROBIC AND ANAEROBIC 5CC  Final   Culture   Final    METHICILLIN RESISTANT STAPHYLOCOCCUS AUREUS Note:  RIFAMPIN AND GENTAMICIN SHOULD NOT BE USED AS SINGLE DRUGS FOR TREATMENT OF STAPH INFECTIONS. CRITICAL RESULT CALLED TO, READ BACK BY AND VERIFIED WITH: DONNA NIEMELA @ 2:27 PM 12/25/14 BY DWEEKS This organism DOES NOT demonstrate inducible  Clindamycin resistance in vitro. Note: Gram Stain Report Called to,Read Back By and Verified With: KARA NARIGON @ 10:03 AM 12/24/14 BY DWEEKS Performed at [facility]    Report Status 12/26/2014 FINAL  Final   Organism ID, Bacteria METHICILLIN RESISTANT STAPHYLOCOCCUS AUREUS  Final      Susceptibility   Methicillin resistant staphylococcus aureus - MIC*    CLINDAMYCIN <=0.25 SENSITIVE Sensitive     ERYTHROMYCIN >=8 RESISTANT Resistant     GENTAMICIN <=0.5 SENSITIVE Sensitive     LEVOFLOXACIN >=8 RESISTANT Resistant      OXACILLIN >=4 RESISTANT Resistant     PENICILLIN >=0.5 RESISTANT Resistant     RIFAMPIN <=0.5 SENSITIVE Sensitive     TRIMETH/SULFA >=320 RESISTANT Resistant     VANCOMYCIN <=0.5 SENSITIVE Sensitive     TETRACYCLINE <=1 SENSITIVE Sensitive     * METHICILLIN RESISTANT STAPHYLOCOCCUS AUREUS  MRSA PCR Screening     Status: Abnormal   Collection Time: 12/22/14  9:51 PM  Result Value Ref Range Status   MRSA by PCR POSITIVE (A) NEGATIVE Final    Comment:        The GeneXpert MRSA Assay (FDA approved for NASAL specimens only), is one component of a comprehensive MRSA colonization surveillance program. It is not intended to diagnose MRSA infection nor to guide or monitor treatment for MRSA infections. RESULT CALLED TO, READ BACK BY AND VERIFIED WITH: ABDUSSALAAM,K RN [phone] AT 0330 SKEEN,P   Culture, blood (x 2)     Status: None (Preliminary result)   Collection Time: 12/22/14 10:37 PM  Result Value Ref Range Status   Specimen Description BLOOD LEFT ANTECUBITAL  Final   Special Requests BOTTLES DRAWN AEROBIC ONLY 10CC  Final   Culture   Final           BLOOD CULTURE RECEIVED NO GROWTH TO DATE CULTURE WILL BE HELD FOR 5 DAYS BEFORE ISSUING A FINAL NEGATIVE REPORT Performed at [facility]    Report Status PENDING  Incomplete  Anaerobic culture     Status: None (Preliminary result)   Collection Time: 12/23/14 11:11 AM  Result Value Ref Range Status   Specimen Description FLUID SYNOVIAL LEFT KNEE  Final   Special Requests NONE  Final   Gram Stain PENDING  Incomplete   Culture   Final    NO ANAEROBES ISOLATED; CULTURE IN PROGRESS FOR 5 DAYS Performed at [facility]    Report Status PENDING  Incomplete  Body fluid culture     Status: None   Collection Time: 12/23/14 11:11 AM  Result Value Ref Range Status   Specimen Description FLUID SYNOVIAL KNEE LEFT  Final   Special Requests NONE  Final   Gram Stain   Final    ABUNDANT WBC PRESENT, PREDOMINANTLY  MONONUCLEAR NO ORGANISMS SEEN Performed at [facility]    Culture   Final    FEW METHICILLIN RESISTANT STAPHYLOCOCCUS AUREUS Note: RIFAMPIN AND GENTAMICIN SHOULD NOT BE USED AS SINGLE DRUGS FOR TREATMENT OF STAPH INFECTIONS. CRITICAL RESULT CALLED TO, READ BACK BY AND VERIFIED WITH: DONNA NIEMELA @ 11:49 AM 12/25/14 BY DWEEKS This organism DOES NOT demonstrate inducible  Clindamycin resistance in vitro. Performed at [facility]    Report Status 12/26/2014 FINAL  Final  Organism ID, Bacteria METHICILLIN RESISTANT STAPHYLOCOCCUS AUREUS  Final      Susceptibility   Methicillin resistant staphylococcus aureus - MIC*    CLINDAMYCIN <=0.25 SENSITIVE Sensitive     ERYTHROMYCIN >=8 RESISTANT Resistant     GENTAMICIN <=0.5 SENSITIVE Sensitive     LEVOFLOXACIN >=8 RESISTANT Resistant     OXACILLIN >=4 RESISTANT Resistant     PENICILLIN >=0.5 RESISTANT Resistant     RIFAMPIN <=0.5 SENSITIVE Sensitive     TRIMETH/SULFA >=320 RESISTANT Resistant     VANCOMYCIN 1 SENSITIVE Sensitive     TETRACYCLINE <=1 SENSITIVE Sensitive     * FEW METHICILLIN RESISTANT STAPHYLOCOCCUS AUREUS  Anaerobic culture     Status: None (Preliminary result)   Collection Time: 12/24/14  8:20 AM  Result Value Ref Range Status   Specimen Description TISSUE LEFT KNEE  Final   Special Requests PATIENT ON FOLLOWING VANCOMYCIN ZOSYN  Final   Gram Stain PENDING  Incomplete   Culture   Final    NO ANAEROBES ISOLATED; CULTURE IN PROGRESS FOR 5 DAYS Performed at [facility]    Report Status PENDING  Incomplete  Tissue culture     Status: None   Collection Time: 12/24/14  8:20 AM  Result Value Ref Range Status   Specimen Description TISSUE LEFT KNEE  Final   Special Requests PATIENT ON FOLLOWING VANCOMYCIN ZOSYN  Final   Gram Stain   Final    ABUNDANT WBC PRESENT, PREDOMINANTLY PMN NO SQUAMOUS EPITHELIAL CELLS SEEN RARE GRAM POSITIVE COCCI IN PAIRS Performed at [facility]    Culture    Final    RARE METHICILLIN RESISTANT STAPHYLOCOCCUS AUREUS Note: RIFAMPIN AND GENTAMICIN SHOULD NOT BE USED AS SINGLE DRUGS FOR TREATMENT OF STAPH INFECTIONS. This organism DOES NOT demonstrate inducible Clindamycin resistance in vitro. CRITICAL RESULT CALLED TO, READ BACK BY AND VERIFIED WITH: DANIELLE SCOTT @  0930 ON 12/27/14 BY KENDR Performed at [facility]    Report Status 12/27/2014 FINAL  Final   Organism ID, Bacteria METHICILLIN RESISTANT STAPHYLOCOCCUS AUREUS  Final      Susceptibility   Methicillin resistant staphylococcus aureus - MIC*    CLINDAMYCIN <=0.25 SENSITIVE Sensitive     ERYTHROMYCIN >=8 RESISTANT Resistant     GENTAMICIN <=0.5 SENSITIVE Sensitive     LEVOFLOXACIN >=8 RESISTANT Resistant     OXACILLIN >=4 RESISTANT Resistant     PENICILLIN >=0.5 RESISTANT Resistant     RIFAMPIN <=0.5 SENSITIVE Sensitive     TRIMETH/SULFA >=320 RESISTANT Resistant     VANCOMYCIN 1 SENSITIVE Sensitive     TETRACYCLINE <=1 SENSITIVE Sensitive     * RARE METHICILLIN RESISTANT STAPHYLOCOCCUS AUREUS  Culture, blood (routine x 2)     Status: None (Preliminary result)   Collection Time: 12/25/14  6:15 AM  Result Value Ref Range Status   Specimen Description BLOOD LEFT ANTECUBITAL  Final   Special Requests   Final    BOTTLES DRAWN AEROBIC AND ANAEROBIC 10CC BLUE 5CC RED   Culture   Final           BLOOD CULTURE RECEIVED NO GROWTH TO DATE CULTURE WILL BE HELD FOR 5 DAYS BEFORE ISSUING A FINAL NEGATIVE REPORT Performed at [facility]    Report Status PENDING  Incomplete  Culture, blood (routine x 2)     Status: None (Preliminary result)   Collection Time: 12/25/14  6:19 AM  Result Value Ref Range Status   Specimen Description BLOOD RIGHT HAND  Final  Special Requests BOTTLES DRAWN AEROBIC ONLY 10CC  Final   Culture   Final           BLOOD CULTURE RECEIVED NO GROWTH TO DATE CULTURE WILL BE HELD FOR 5 DAYS BEFORE ISSUING A FINAL NEGATIVE REPORT Performed at [facility]    Report Status PENDING  Incomplete     Studies: No results found.  Scheduled Meds: . sodium chloride  10 mL/hr Intravenous Once  . aspirin EC  81 mg Oral Daily  . atorvastatin  80 mg Oral q1800  . diltiazem  120 mg Oral Daily  . feeding supplement (GLUCERNA SHAKE)  237 mL Oral QAC breakfast  . feeding supplement (PRO-STAT SUGAR FREE 64)  30 mL Oral QHS  . hydrocortisone  25 mg Rectal BID  . insulin aspart  0-9 Units Subcutaneous TID WC  . insulin glargine  20 Units Subcutaneous QHS  . nystatin   Topical BID  . rivaroxaban  15 mg Oral Q supper  . sodium chloride  3 mL Intravenous Q12H  . vancomycin  1,500 mg Intravenous Q24H   Continuous Infusions: . sodium chloride 20 mL/hr at 12/27/14 0147    Principal Problem:   Septic joint of left knee joint Active Problems:   Diabetes mellitus without complication   Hypertension   Depression   Fever   Knee fracture, left   Sepsis   Tachycardia with 141 - 160 beats per minute   Essential hypertension   Blood poisoning   AKI (acute kidney injury)   Bacteremia   Atrial fibrillation and flutter    Time spent: 30 minutes    [facility] Pager [phone]. If 7PM-7AM, please contact night-coverage at [email or web address], password [facility] 12/28/2014, 6:06 PM  LOS: 6 days

## 2014-12-28 NOTE — Progress Notes (Signed)
Physical Therapy Treatment Patient Details Name: Laura Davenport MRN: [id number] DOB: 10/02/1930 Today's Date: 12/28/2014    History of Present Illness 81 y.o. female who had an ORIF of a left patella fracture 6 weeks ago,.Admitted due to increaed pain/swelling L knee and to r/o infection. Pt with Bledsoe brace locked out in extension s/p I&D and wound vac    PT Comments    Patient continues to be limited by overall fatigue and weakness. Attempted stand x3 and patient willing and attempting what was asked but unable with +2 assist to stand fully upright and be safe to attempt transfer. Continue to recommend SNF for ongoing Physical Therapy.     Follow Up Recommendations  SNF     Equipment Recommendations  None recommended by PT    Recommendations for Other Services       Precautions / Restrictions Precautions Precaution Comments: Bledsoe brace locked out in full extension Required Braces or Orthoses: Other Brace/Splint Restrictions LLE Weight Bearing: Weight bearing as tolerated    Mobility  Bed Mobility Overal bed mobility: Needs Assistance Bed Mobility: Supine to Sit;Sit to Supine     Supine to sit: Mod assist;+2 for physical assistance Sit to supine: Max assist;+2 for physical assistance   General bed mobility comments: Patient cued for safe technique and positioniong. patient able to move LLE to EOB with min A but required Mod A +2 to elevate trunk and come into upright seated position. A with all aspects back into bed. Patient initiating movement from LEs  Transfers Overall transfer level: Needs assistance Equipment used: 2 person hand held assist Transfers: Sit to/from Stand Sit to Stand: +2 physical assistance;Total assist         General transfer comment: Attempted to stand x3 (once with use of steady) and unable to gain full upright posture and patient with heavy posterior lean and increased flexion at hip. Unable to attempt safe transfer. Recommend lift for  OOB  Ambulation/Gait             General Gait Details: unable to attempt.    Stairs            Wheelchair Mobility    Modified Rankin (Stroke Patients Only)       Balance                                    Cognition Arousal/Alertness: Awake/alert Behavior During Therapy: WFL for tasks assessed/performed Overall Cognitive Status: Within Functional Limits for tasks assessed                      Exercises      General Comments        Pertinent Vitals/Pain Pain Assessment: No/denies pain    Home Living                      Prior Function            PT Goals (current goals can now be found in the care plan section) Progress towards PT goals: Progressing toward goals    Frequency  Min 2X/week    PT Plan Current plan remains appropriate    Co-evaluation             End of Session Equipment Utilized During Treatment: Gait belt Activity Tolerance: Patient limited by fatigue Patient left: with call bell/phone within reach;in bed  Time: [id number] PT Time Calculation (min) (ACUTE ONLY): 29 min  Charges:  $Therapeutic Activity: 23-37 mins                    G Codes:      Jacqualyn Posey 12/28/2014, 1:47 PM  12/28/2014 Jacqualyn Posey PTA [phone] pager [phone] office

## 2014-12-29 ENCOUNTER — Inpatient Hospital Stay
Admission: RE | Admit: 2014-12-29 | Discharge: 2014-12-30 | Disposition: A | Discharge status: Other Acute Inpatient Hospital | Payer: Medicare Other | Source: Ambulatory Visit | Attending: Internal Medicine | Admitting: Internal Medicine

## 2014-12-29 DIAGNOSIS — Z794 Long term (current) use of insulin: Secondary | ICD-10-CM | POA: Diagnosis not present

## 2014-12-29 DIAGNOSIS — R1310 Dysphagia, unspecified: Secondary | ICD-10-CM | POA: Diagnosis not present

## 2014-12-29 DIAGNOSIS — I10 Essential (primary) hypertension: Secondary | ICD-10-CM | POA: Diagnosis not present

## 2014-12-29 DIAGNOSIS — M6281 Muscle weakness (generalized): Secondary | ICD-10-CM | POA: Diagnosis not present

## 2014-12-29 DIAGNOSIS — E785 Hyperlipidemia, unspecified: Secondary | ICD-10-CM | POA: Diagnosis not present

## 2014-12-29 DIAGNOSIS — K920 Hematemesis: Secondary | ICD-10-CM | POA: Diagnosis not present

## 2014-12-29 DIAGNOSIS — K2900 Acute gastritis without bleeding: Secondary | ICD-10-CM | POA: Diagnosis not present

## 2014-12-29 DIAGNOSIS — I471 Supraventricular tachycardia: Secondary | ICD-10-CM | POA: Diagnosis not present

## 2014-12-29 DIAGNOSIS — R262 Difficulty in walking, not elsewhere classified: Secondary | ICD-10-CM | POA: Diagnosis not present

## 2014-12-29 DIAGNOSIS — R109 Unspecified abdominal pain: Secondary | ICD-10-CM | POA: Diagnosis not present

## 2014-12-29 DIAGNOSIS — Z7952 Long term (current) use of systemic steroids: Secondary | ICD-10-CM | POA: Diagnosis not present

## 2014-12-29 DIAGNOSIS — Z4789 Encounter for other orthopedic aftercare: Secondary | ICD-10-CM | POA: Diagnosis not present

## 2014-12-29 DIAGNOSIS — I4891 Unspecified atrial fibrillation: Secondary | ICD-10-CM | POA: Diagnosis not present

## 2014-12-29 DIAGNOSIS — I4892 Unspecified atrial flutter: Secondary | ICD-10-CM | POA: Diagnosis not present

## 2014-12-29 DIAGNOSIS — K2950 Unspecified chronic gastritis without bleeding: Secondary | ICD-10-CM | POA: Diagnosis not present

## 2014-12-29 DIAGNOSIS — R112 Nausea with vomiting, unspecified: Secondary | ICD-10-CM | POA: Diagnosis not present

## 2014-12-29 DIAGNOSIS — K269 Duodenal ulcer, unspecified as acute or chronic, without hemorrhage or perforation: Secondary | ICD-10-CM | POA: Diagnosis not present

## 2014-12-29 DIAGNOSIS — Z8659 Personal history of other mental and behavioral disorders: Secondary | ICD-10-CM | POA: Diagnosis not present

## 2014-12-29 DIAGNOSIS — E782 Mixed hyperlipidemia: Secondary | ICD-10-CM | POA: Diagnosis not present

## 2014-12-29 DIAGNOSIS — S42301A Unspecified fracture of shaft of humerus, right arm, initial encounter for closed fracture: Secondary | ICD-10-CM | POA: Diagnosis not present

## 2014-12-29 DIAGNOSIS — F329 Major depressive disorder, single episode, unspecified: Secondary | ICD-10-CM | POA: Diagnosis not present

## 2014-12-29 DIAGNOSIS — K279 Peptic ulcer, site unspecified, unspecified as acute or chronic, without hemorrhage or perforation: Secondary | ICD-10-CM | POA: Diagnosis not present

## 2014-12-29 DIAGNOSIS — Z9181 History of falling: Secondary | ICD-10-CM | POA: Diagnosis not present

## 2014-12-29 DIAGNOSIS — M009 Pyogenic arthritis, unspecified: Secondary | ICD-10-CM | POA: Diagnosis not present

## 2014-12-29 DIAGNOSIS — S82092D Other fracture of left patella, subsequent encounter for closed fracture with routine healing: Secondary | ICD-10-CM | POA: Diagnosis not present

## 2014-12-29 DIAGNOSIS — S82002A Unspecified fracture of left patella, initial encounter for closed fracture: Secondary | ICD-10-CM | POA: Diagnosis not present

## 2014-12-29 DIAGNOSIS — Z79899 Other long term (current) drug therapy: Secondary | ICD-10-CM | POA: Diagnosis not present

## 2014-12-29 DIAGNOSIS — Z7901 Long term (current) use of anticoagulants: Secondary | ICD-10-CM | POA: Diagnosis not present

## 2014-12-29 DIAGNOSIS — Z79891 Long term (current) use of opiate analgesic: Secondary | ICD-10-CM | POA: Diagnosis not present

## 2014-12-29 DIAGNOSIS — R1013 Epigastric pain: Secondary | ICD-10-CM | POA: Diagnosis present

## 2014-12-29 DIAGNOSIS — Z9049 Acquired absence of other specified parts of digestive tract: Secondary | ICD-10-CM | POA: Diagnosis not present

## 2014-12-29 DIAGNOSIS — R278 Other lack of coordination: Secondary | ICD-10-CM | POA: Diagnosis not present

## 2014-12-29 DIAGNOSIS — K2980 Duodenitis without bleeding: Secondary | ICD-10-CM | POA: Diagnosis not present

## 2014-12-29 DIAGNOSIS — K209 Esophagitis, unspecified: Secondary | ICD-10-CM | POA: Diagnosis not present

## 2014-12-29 DIAGNOSIS — E119 Type 2 diabetes mellitus without complications: Secondary | ICD-10-CM | POA: Diagnosis not present

## 2014-12-29 DIAGNOSIS — N179 Acute kidney failure, unspecified: Secondary | ICD-10-CM | POA: Diagnosis not present

## 2014-12-29 DIAGNOSIS — R488 Other symbolic dysfunctions: Secondary | ICD-10-CM | POA: Diagnosis not present

## 2014-12-29 DIAGNOSIS — R7881 Bacteremia: Secondary | ICD-10-CM | POA: Diagnosis not present

## 2014-12-29 DIAGNOSIS — S42201D Unspecified fracture of upper end of right humerus, subsequent encounter for fracture with routine healing: Secondary | ICD-10-CM | POA: Diagnosis not present

## 2014-12-29 DIAGNOSIS — R609 Edema, unspecified: Secondary | ICD-10-CM | POA: Diagnosis not present

## 2014-12-29 DIAGNOSIS — Z792 Long term (current) use of antibiotics: Secondary | ICD-10-CM | POA: Diagnosis not present

## 2014-12-29 DIAGNOSIS — Z8719 Personal history of other diseases of the digestive system: Secondary | ICD-10-CM | POA: Diagnosis not present

## 2014-12-29 DIAGNOSIS — S82041G Displaced comminuted fracture of right patella, subsequent encounter for closed fracture with delayed healing: Secondary | ICD-10-CM | POA: Diagnosis not present

## 2014-12-29 DIAGNOSIS — Z7982 Long term (current) use of aspirin: Secondary | ICD-10-CM | POA: Diagnosis not present

## 2014-12-29 DIAGNOSIS — K922 Gastrointestinal hemorrhage, unspecified: Secondary | ICD-10-CM | POA: Diagnosis present

## 2014-12-29 LAB — ANAEROBIC CULTURE

## 2014-12-29 LAB — BASIC METABOLIC PANEL
Anion gap: 8 (ref 5–15)
BUN: 5 mg/dL — ABNORMAL LOW (ref 6–23)
CO2: 26 mmol/L (ref 19–32)
Calcium: 8.8 mg/dL (ref 8.4–10.5)
Chloride: 101 mmol/L (ref 96–112)
Creatinine, Ser: 0.9 mg/dL (ref 0.50–1.10)
GFR calc Af Amer: 67 mL/min — ABNORMAL LOW (ref >90)
GFR calc non Af Amer: 58 mL/min — ABNORMAL LOW (ref >90)
Glucose, Bld: 117 mg/dL — ABNORMAL HIGH (ref 70–99)
Potassium: 4.1 mmol/L (ref 3.5–5.1)
Sodium: 135 mmol/L (ref 135–145)

## 2014-12-29 LAB — GLUCOSE, CAPILLARY
Glucose-Capillary: 140 mg/dL — ABNORMAL HIGH (ref 70–99)
Glucose-Capillary: 143 mg/dL — ABNORMAL HIGH (ref 70–99)
Glucose-Capillary: 166 mg/dL — ABNORMAL HIGH (ref 70–99)
Glucose-Capillary: 114 mg/dL — ABNORMAL HIGH (ref 70–99)

## 2014-12-29 LAB — CULTURE, BLOOD (ROUTINE X 2): Culture: NO GROWTH

## 2014-12-29 MED ORDER — HYDROCORTISONE ACETATE 25 MG RE SUPP: 25.0000 mg | Freq: Two times a day (BID) | RECTAL | Status: DC

## 2014-12-29 MED ORDER — DILTIAZEM HCL ER COATED BEADS 120 MG PO CP24: 120.0000 mg | ORAL_CAPSULE | Freq: Every day | ORAL | Status: DC

## 2014-12-29 MED ORDER — RIVAROXABAN 20 MG PO TABS
20.0000 mg | ORAL_TABLET | Freq: Every day | ORAL | Status: DC
Start: 2014-12-29 — End: 2015-01-06

## 2014-12-29 MED ORDER — INSULIN ASPART 100 UNIT/ML ~~LOC~~ SOLN: 0.0000 [IU] | Freq: Three times a day (TID) | SUBCUTANEOUS | Status: DC

## 2014-12-29 MED ORDER — RIVAROXABAN 20 MG PO TABS: 20.0000 mg | ORAL_TABLET | Freq: Every day | ORAL | Status: DC

## 2014-12-29 MED ORDER — INSULIN GLARGINE 100 UNIT/ML ~~LOC~~ SOLN: 20.0000 [IU] | Freq: Every day | SUBCUTANEOUS | Status: DC

## 2014-12-29 MED ORDER — VANCOMYCIN HCL 10 G IV SOLR: 1500.0000 mg | INTRAVENOUS | Status: DC

## 2014-12-29 MED ORDER — HEPARIN SOD (PORK) LOCK FLUSH 100 UNIT/ML IV SOLN
250.0000 [IU] | INTRAVENOUS | Status: AC | PRN
  Administered 2014-12-29: 250 [IU]

## 2014-12-29 MED ORDER — SODIUM CHLORIDE 0.9 % IJ SOLN
10.0000 mL | INTRAMUSCULAR | Status: DC | PRN
  Administered 2014-12-29: 10 mL
  Filled 2014-12-29: qty 40

## 2014-12-29 NOTE — Discharge Summary (Signed)
Physician Discharge Summary  Laura Davenport [id number] DOB: 06-07-1931 DOA: 12/22/2014  PCP: [name], MD  Admit date: 12/22/2014 Discharge date: 12/29/2014  Time spent: 30 minutes  Recommendations for Outpatient Follow-up:  1. Follow up with cardiology in 2 to 4 weeks.  2. Follow up with orthopedics as recommended 3. Follow up with Infectious disease in 3 weeks before the antibiotics are finished.  4. Please obtain weekly bmp's and vancomycin trough levels.  5. Please follow up with PCP in 2 weeks.   Discharge Diagnoses:  Principal Problem:   Septic joint of left knee joint Active Problems:   Diabetes mellitus without complication   Hypertension   Depression   Fever   Knee fracture, left   Sepsis   Tachycardia with 141 - 160 beats per minute   Essential hypertension   Blood poisoning   AKI (acute kidney injury)   Bacteremia   Atrial fibrillation and flutter   Discharge Condition: improved  Diet recommendation: diabetic diet   Filed Weights   12/22/14 2123 12/22/14 2200  Weight: 88.6 kg (195 lb 5.2 oz) 88.6 kg (195 lb 5.2 oz)    History of present illness:  Laura Davenport is a 81 y.o. female with past medical history of hypertension, hyperlipidemia, diabetes mellitus, depression, chronic kidney disease-stage III, s/p of left patellar fracture surgery, who presents with left knee swelling, pain and fever. Found to have sepsis due to MRSA left septic joint and bacteremia. Patient status post hardware removal and knee wash out. Per ID to treat for 4 weeks with IV vancomycin. Waiting PICC line; back to SNF for treatment and rehabilitation. Patient developed PAF with sepsis; cardiology curbside and given elevated CHASDVASC, they recommended to use anticoagulation and maintain lowest dose of cardizem need for rate control. Patient in sinus rhythm now.  Hospital Course:  sepsis: Appears to be secondary to left knee septic joint; patient with 1/2 positive blood cx's for MRSA  nd also positive joint aspiration culture positive for MRSA -Fluid aspirated has been sent for culture and results are pending, negative so far.  -1/2 blood cultures positive for MRSA and positive joint aspiration cx's for MRSA -Will continue vancomycin as per infectious disease recommendations (four weeks of treatment) till 01/21/15. -Taken to the OR on 12/24/2014; has received arthroscopy and knee washout with hardware removal -no valve abnormalities appreciated on TTE -patient now non-toxic and feeling better -no fever and WBC's WNL - PICC line  Today and d/c to SNF.  Follow up with ID before the antibiotics are completed.   Tachycardia/PAF: resolved -trop is slightly elevated at 0.04 and 0.06; most likely demand ischemia from ongoing tachycardia -patient w/o CP and no SOB -2-D echo demonstrating no wall motion abnormalities, preserved EF and just grade 1 diastolic heart failure. -Continue oral cardizem and aspirin. Started her on xarelto.   AoCKD-III: In the setting of sepsis and continue use of nephrotoxic agents -Renal ultrasound without hydronephrosis or obstruction -Continue holding losartan and HCTZ for now - creatinine is 1.2 today. Plan to hydrate and repeat creatinine in am.   HTN: Blood pressure is stable and improving   Diabetes mellitus: No A1C on record. Patient is on Lantus 30 units daily at home  -continue decrease Lantus dose of 20 units; CBG is stable with current regimen -sliding scale insulin  -A1c 6.4  CBG (last 3)   Recent Labs  12/28/14 2313 12/29/14 0636 12/29/14 1129  GLUCAP 140* 143* 166*      Hyperlipidemia: Patient is on  Lipitor at home. No LDL record. -Continue Lipitor  Blood cultures 1/2 positive for MRSA and positive joint aspiration cultures positive for MRSA -patient on vancomycin -repeated blood cx's on 4/24 ordered; if neg >48 hours will place PICC -per ID recommendations will treat with vancomycin for 4 weeks (starting to count  day after surgery); she will finish treatment on 01/21/15 -second set of blood cx's no growth > 48 hours now. picc line placed today and plan to continue vancomycin up until the 5/ 21 to complete the treatment.   BRPB -most likely hemorrhoids -will start Anusol -Hgb stable   Procedures: :  See below for x-ray reports  Arthroscopy and washout of her left knee with hardware removal 4/23  Consultations:  Orthopedics  ID    Discharge Exam: Filed Vitals:   12/29/14 1136  BP: 105/54  Pulse:   Temp:   Resp:     General: alert afebrile comfortable Cardiovascular: s1s2 Respiratory: ctab  Discharge Instructions   Discharge Instructions    Weight bearing as tolerated    Complete by:  As directed   In knee immobilizer at all times          Current Discharge Medication List    START taking these medications   Details  diltiazem (CARDIZEM CD) 120 MG 24 hr capsule Take 1 capsule (120 mg total) by mouth daily. Qty: 30 capsule, Refills: 0    hydrocortisone (ANUSOL-HC) 25 MG suppository Place 1 suppository (25 mg total) rectally 2 (two) times daily. Qty: 12 suppository, Refills: 0    insulin aspart (NOVOLOG) 100 UNIT/ML injection Inject 0-9 Units into the skin 3 (three) times daily with meals. Qty: 10 mL, Refills: 11    vancomycin 1,500 mg in sodium chloride 0.9 % 500 mL Inject 1,500 mg into the vein daily.      CONTINUE these medications which have CHANGED   Details  insulin glargine (LANTUS) 100 UNIT/ML injection Inject 0.2 mLs (20 Units total) into the skin at bedtime. Qty: 10 mL, Refills: 11    oxyCODONE-acetaminophen (PERCOCET/ROXICET) 5-325 MG per tablet Take 1 tablet by mouth every 4 (four) hours as needed for severe pain. Qty: 30 tablet, Refills: 0    rivaroxaban (XARELTO) 20 MG TABS tablet Take 1 tablet (20 mg total) by mouth daily with supper. Qty: 30 tablet      CONTINUE these medications which have NOT CHANGED   Details  acetaminophen (TYLENOL)  325 MG tablet Take 650 mg by mouth every 6 (six) hours as needed.    Amino Acids-Protein Hydrolys (FEEDING SUPPLEMENT, PRO-STAT SUGAR FREE 64,) LIQD Take 30 mLs by mouth at bedtime.    aspirin EC 81 MG tablet Take 81 mg by mouth every morning.     atorvastatin (LIPITOR) 80 MG tablet Take 80 mg by mouth daily at 6 PM.    feeding supplement, GLUCERNA SHAKE, (GLUCERNA SHAKE) LIQD Take 237 mLs by mouth daily before breakfast.    sertraline (ZOLOFT) 25 MG tablet Take 25 mg by mouth daily.      STOP taking these medications     hydrochlorothiazide (HYDRODIURIL) 25 MG tablet      losartan (COZAAR) 100 MG tablet        No Known Allergies Follow-up Information    Follow up with MURPHY, TIMOTHY D, MD In 1 week.   Specialty:  Orthopedic Surgery   Contact information:   [address] [place] [phone]        The results  of significant diagnostics from this hospitalization (including imaging, microbiology, ancillary and laboratory) are listed below for reference.    Significant Diagnostic Studies: Dg Chest 2 View  12/22/2014   CLINICAL DATA:  Fever and tachycardia today.  EXAM: CHEST  2 VIEW  COMPARISON:  12/04/2014  FINDINGS: The heart is enlarged but stable. Stable tortuosity and calcification of the thoracic aorta. The lungs are clear. No infiltrates or effusions. The bony thorax is intact.  IMPRESSION: Stable cardiac enlargement.  No acute pulmonary findings.   Electronically Signed   By: Marijo Sanes M.D.   On: 12/22/2014 18:59   US Renal  12/23/2014   CLINICAL DATA:  Patient with acute renal failure.  EXAM: RENAL/URINARY TRACT ULTRASOUND COMPLETE  COMPARISON:  CT abdomen pelvis 11/01/2012  FINDINGS: Right Kidney:  Length: 10.0 cm. No hydronephrosis. There is a 1.7 x 1.2 x 1.6 cm hypoechoic lesion within the right kidney, potentially representing a small cyst. The renal cortex is increased in echogenicity.  Left Kidney:  Length: 11.1 cm.  No  hydronephrosis.  No definite renal mass.  Bladder:  Appears normal for degree of bladder distention.  IMPRESSION: No hydronephrosis.  Slightly increased renal cortical echogenicity suggestive of chronic renal disease.   Electronically Signed   By: Lovey Newcomer M.D.   On: 12/23/2014 08:27   Dg Knee Complete 4 Views Left  12/22/2014   CLINICAL DATA:  Status post fall 6 weeks ago with subsequent repair right patellar fracture. Now fever, pain and swelling about the left knee for 2 days.  EXAM: LEFT KNEE - COMPLETE 4+ VIEW  COMPARISON:  Two views left knee 12/04/2014 and 11/17/2014.  FINDINGS: 2 pins and a tension band are identified for fixation of a patellar fracture. The inferior fracture fragment is now distracted 1.6 cm compared to anatomic position and alignment on the 11/17/2014 study. The inferiorly distracted fracture fragment is fragmented and the bone is rarefied. New lucency is seen about fixation pins in the patella. Image bones otherwise appear normal. No joint effusion is identified.  IMPRESSION: The inferior pole of patella fracture fragment is now distracted and fragmented highly suspicious for osteomyelitis with associated soft tissue swelling identified. New lucency about 2 fixation pins is also seen most consistent with loosening and infection.   Electronically Signed   By: Inge Rise M.D.   On: 12/22/2014 16:34   Dg Fluoro Guide Ndl Plcd/bx/inj/loc  12/23/2014   CLINICAL DATA:  History of left patellar fracture with subsequent internal fixation 11/17/2014. Now with left knee pain, swelling and erythema.  EXAM: LEFT KNEE ASPIRATION UNDER FLUOROSCOPY  FLUOROSCOPY TIME:  Radiation Exposure Index (as provided by the fluoroscopic device):  If the device does not provide the exposure index:  Fluoroscopy Time (in minutes and seconds):  18 seconds  Number of Acquired Images:  None.  PROCEDURE: The risks and benefits of the procedure were discussed with the patient and written consent was  obtained. Overlying skin prepped with Betadine, draped in the usual sterile fashion and infiltrated locally with buffered Lidocaine. An 18 gauge hypodermic needle was used access the right knee joint. Aspiration immediately yielded purulent appearing joint fluid. A total 7.5 cc was aspirated. The patient tolerated the procedure well without immediate complication.  IMPRESSION: Successful aspiration of the left knee joint yielding purulent joint fluid. Specimen sent to pathology for testing.   Electronically Signed   By: Inge Rise M.D.   On: 12/23/2014 11:37    Microbiology: Recent Results (from the past 240  hour(s))  Culture, blood (x 2)     Status: None   Collection Time: 12/22/14  4:45 PM  Result Value Ref Range Status   Specimen Description BLOOD RIGHT ARM  Final   Special Requests BOTTLES DRAWN AEROBIC AND ANAEROBIC 5CC  Final   Culture   Final    METHICILLIN RESISTANT STAPHYLOCOCCUS AUREUS Note: RIFAMPIN AND GENTAMICIN SHOULD NOT BE USED AS SINGLE DRUGS FOR TREATMENT OF STAPH INFECTIONS. CRITICAL RESULT CALLED TO, READ BACK BY AND VERIFIED WITH: DONNA NIEMELA @ 2:27 PM 12/25/14 BY DWEEKS This organism DOES NOT demonstrate inducible  Clindamycin resistance in vitro. Note: Gram Stain Report Called to,Read Back By and Verified With: KARA NARIGON @ 10:03 AM 12/24/14 BY DWEEKS Performed at [facility]    Report Status 12/26/2014 FINAL  Final   Organism ID, Bacteria METHICILLIN RESISTANT STAPHYLOCOCCUS AUREUS  Final      Susceptibility   Methicillin resistant staphylococcus aureus - MIC*    CLINDAMYCIN <=0.25 SENSITIVE Sensitive     ERYTHROMYCIN >=8 RESISTANT Resistant     GENTAMICIN <=0.5 SENSITIVE Sensitive     LEVOFLOXACIN >=8 RESISTANT Resistant     OXACILLIN >=4 RESISTANT Resistant     PENICILLIN >=0.5 RESISTANT Resistant     RIFAMPIN <=0.5 SENSITIVE Sensitive     TRIMETH/SULFA >=320 RESISTANT Resistant     VANCOMYCIN <=0.5 SENSITIVE Sensitive     TETRACYCLINE <=1  SENSITIVE Sensitive     * METHICILLIN RESISTANT STAPHYLOCOCCUS AUREUS  MRSA PCR Screening     Status: Abnormal   Collection Time: 12/22/14  9:51 PM  Result Value Ref Range Status   MRSA by PCR POSITIVE (A) NEGATIVE Final    Comment:        The GeneXpert MRSA Assay (FDA approved for NASAL specimens only), is one component of a comprehensive MRSA colonization surveillance program. It is not intended to diagnose MRSA infection nor to guide or monitor treatment for MRSA infections. RESULT CALLED TO, READ BACK BY AND VERIFIED WITH: ABDUSSALAAM,K RN [phone] AT 0330 SKEEN,P   Culture, blood (x 2)     Status: None   Collection Time: 12/22/14 10:37 PM  Result Value Ref Range Status   Specimen Description BLOOD LEFT ANTECUBITAL  Final   Special Requests BOTTLES DRAWN AEROBIC ONLY 10CC  Final   Culture   Final    NO GROWTH 5 DAYS Performed at [facility]    Report Status 12/29/2014 FINAL  Final  Anaerobic culture     Status: None   Collection Time: 12/23/14 11:11 AM  Result Value Ref Range Status   Specimen Description FLUID SYNOVIAL LEFT KNEE  Final   Special Requests NONE  Final   Gram Stain   Final    ABUNDANT WBC PRESENT,BOTH PMN AND MONONUCLEAR NO ORGANISMS SEEN Performed at [facility]    Culture   Final    NO ANAEROBES ISOLATED Performed at [facility]    Report Status 12/29/2014 FINAL  Final  Body fluid culture     Status: None   Collection Time: 12/23/14 11:11 AM  Result Value Ref Range Status   Specimen Description FLUID SYNOVIAL KNEE LEFT  Final   Special Requests NONE  Final   Gram Stain   Final    ABUNDANT WBC PRESENT, PREDOMINANTLY MONONUCLEAR NO ORGANISMS SEEN Performed at [facility]    Culture   Final    FEW METHICILLIN RESISTANT STAPHYLOCOCCUS AUREUS Note: RIFAMPIN AND GENTAMICIN SHOULD NOT BE USED AS SINGLE DRUGS FOR  TREATMENT OF STAPH INFECTIONS. CRITICAL RESULT CALLED TO, READ BACK BY AND VERIFIED WITH: DONNA NIEMELA  @ 11:49 AM 12/25/14 BY DWEEKS This organism DOES NOT demonstrate inducible  Clindamycin resistance in vitro. Performed at [facility]    Report Status 12/26/2014 FINAL  Final   Organism ID, Bacteria METHICILLIN RESISTANT STAPHYLOCOCCUS AUREUS  Final      Susceptibility   Methicillin resistant staphylococcus aureus - MIC*    CLINDAMYCIN <=0.25 SENSITIVE Sensitive     ERYTHROMYCIN >=8 RESISTANT Resistant     GENTAMICIN <=0.5 SENSITIVE Sensitive     LEVOFLOXACIN >=8 RESISTANT Resistant     OXACILLIN >=4 RESISTANT Resistant     PENICILLIN >=0.5 RESISTANT Resistant     RIFAMPIN <=0.5 SENSITIVE Sensitive     TRIMETH/SULFA >=320 RESISTANT Resistant     VANCOMYCIN 1 SENSITIVE Sensitive     TETRACYCLINE <=1 SENSITIVE Sensitive     * FEW METHICILLIN RESISTANT STAPHYLOCOCCUS AUREUS  Anaerobic culture     Status: None   Collection Time: 12/24/14  8:20 AM  Result Value Ref Range Status   Specimen Description TISSUE LEFT KNEE  Final   Special Requests PATIENT ON FOLLOWING VANCOMYCIN ZOSYN  Final   Gram Stain   Final    ABUNDANT WBC PRESENT, PREDOMINANTLY PMN NO SQUAMOUS EPITHELIAL CELLS SEEN RARE GRAM POSITIVE COCCI IN PAIRS Performed at [facility]    Culture   Final    NO ANAEROBES ISOLATED Performed at [facility]    Report Status 12/29/2014 FINAL  Final  Tissue culture     Status: None   Collection Time: 12/24/14  8:20 AM  Result Value Ref Range Status   Specimen Description TISSUE LEFT KNEE  Final   Special Requests PATIENT ON FOLLOWING VANCOMYCIN ZOSYN  Final   Gram Stain   Final    ABUNDANT WBC PRESENT, PREDOMINANTLY PMN NO SQUAMOUS EPITHELIAL CELLS SEEN RARE GRAM POSITIVE COCCI IN PAIRS Performed at [facility]    Culture   Final    RARE METHICILLIN RESISTANT STAPHYLOCOCCUS AUREUS Note: RIFAMPIN AND GENTAMICIN SHOULD NOT BE USED AS SINGLE DRUGS FOR TREATMENT OF STAPH INFECTIONS. This organism DOES NOT demonstrate inducible Clindamycin  resistance in vitro. CRITICAL RESULT CALLED TO, READ BACK BY AND VERIFIED WITH: DANIELLE SCOTT @  0930 ON 12/27/14 BY KENDR Performed at [facility]    Report Status 12/27/2014 FINAL  Final   Organism ID, Bacteria METHICILLIN RESISTANT STAPHYLOCOCCUS AUREUS  Final      Susceptibility   Methicillin resistant staphylococcus aureus - MIC*    CLINDAMYCIN <=0.25 SENSITIVE Sensitive     ERYTHROMYCIN >=8 RESISTANT Resistant     GENTAMICIN <=0.5 SENSITIVE Sensitive     LEVOFLOXACIN >=8 RESISTANT Resistant     OXACILLIN >=4 RESISTANT Resistant     PENICILLIN >=0.5 RESISTANT Resistant     RIFAMPIN <=0.5 SENSITIVE Sensitive     TRIMETH/SULFA >=320 RESISTANT Resistant     VANCOMYCIN 1 SENSITIVE Sensitive     TETRACYCLINE <=1 SENSITIVE Sensitive     * RARE METHICILLIN RESISTANT STAPHYLOCOCCUS AUREUS  Culture, blood (routine x 2)     Status: None (Preliminary result)   Collection Time: 12/25/14  6:15 AM  Result Value Ref Range Status   Specimen Description BLOOD LEFT ANTECUBITAL  Final   Special Requests   Final    BOTTLES DRAWN AEROBIC AND ANAEROBIC 10CC BLUE 5CC RED   Culture   Final           BLOOD CULTURE  RECEIVED NO GROWTH TO DATE CULTURE WILL BE HELD FOR 5 DAYS BEFORE ISSUING A FINAL NEGATIVE REPORT Performed at [facility]    Report Status PENDING  Incomplete  Culture, blood (routine x 2)     Status: None (Preliminary result)   Collection Time: 12/25/14  6:19 AM  Result Value Ref Range Status   Specimen Description BLOOD RIGHT HAND  Final   Special Requests BOTTLES DRAWN AEROBIC ONLY 10CC  Final   Culture   Final           BLOOD CULTURE RECEIVED NO GROWTH TO DATE CULTURE WILL BE HELD FOR 5 DAYS BEFORE ISSUING A FINAL NEGATIVE REPORT Performed at [facility]    Report Status PENDING  Incomplete     Labs: Basic Metabolic Panel:  Recent Labs Lab 12/24/14 0255 12/25/14 0615 12/26/14 0430 12/28/14 0531 12/29/14 0623  NA 134* 134* 131* 133* 135  K  3.2* 5.2* 4.7 4.4 4.1  CL 103 106 103 106 101  CO2 22 22 21 20 26   GLUCOSE 93 84 116* 96 117*  BUN 17 18 15 20  5*  CREATININE 1.05 1.04 0.87 1.28* 0.90  CALCIUM 7.8* 7.9* 7.7* 7.8* 8.8   Liver Function Tests:  Recent Labs Lab 12/23/14 0310  AST 22  ALT 19  ALKPHOS 70  BILITOT 1.1  PROT 6.7  ALBUMIN 1.9*   No results for input(s): LIPASE, AMYLASE in the last 168 hours. No results for input(s): AMMONIA in the last 168 hours. CBC:  Recent Labs Lab 12/22/14 1742 12/23/14 0310 12/24/14 0255 12/25/14 0615 12/26/14 0430 12/28/14 0531  WBC 12.8* 15.0* 10.3 7.8 5.7 6.5  NEUTROABS 11.2*  --   --   --   --   --   HGB 9.1* 7.7* 7.5* 9.7* 10.4* 9.9*  HCT 26.6* 23.6* 23.0* 27.9* 30.5* 28.8*  MCV 89.3 87.4 87.1 91.8 93.6 92.0  PLT 354 305 286 288 294 321   Cardiac Enzymes:  Recent Labs Lab 12/22/14 2237 12/23/14 0310 12/23/14 1219  TROPONINI 0.03 0.04* 0.06*   BNP: BNP (last 3 results)  Recent Labs  12/23/14 0310  BNP 154.3*    ProBNP (last 3 results) No results for input(s): PROBNP in the last 8760 hours.  CBG:  Recent Labs Lab 12/28/14 1235 12/28/14 1630 12/28/14 2313 12/29/14 0636 12/29/14 1129  GLUCAP 92 115* 140* 143* 166*       Signed:  Kaevon Cotta  [facility] 12/29/2014, 1:31 PM

## 2014-12-29 NOTE — Clinical Social Work Placement (Signed)
   CLINICAL SOCIAL WORK PLACEMENT  NOTE  Date:  12/29/2014  Patient Details  Name: Laura Davenport MRN: [id number] Date of Birth: 1931-01-24  Clinical Social Work is seeking post-discharge placement for this patient at the [place] level of care (*CSW will initial, date and re-position this form in  chart as items are completed):  Yes   Patient/family provided with [facility] Work Department's list of facilities offering this level of care within the geographic area requested by the patient (or if unable, by the patient's family).  Yes   Patient/family informed of their freedom to choose among providers that offer the needed level of care, that participate in Medicare, Medicaid or managed care program needed by the patient, have an available bed and are willing to accept the patient.  Yes   Patient/family informed of [facility]'s ownership interest in [facility] and [facility], as well as of the fact that they are under no obligation to receive care at these facilities.  PASRR submitted to EDS on       PASRR number received on       Existing PASRR number confirmed on 12/26/14     FL2 transmitted to all facilities in geographic area requested by pt/family on 12/26/14     FL2 transmitted to all facilities within larger geographic area on 12/26/14     Patient informed that his/her managed care company has contracts with or will negotiate with certain facilities, including the following:        Yes   Patient/family informed of bed offers received.  Patient chooses bed at [facility]     Physician recommends and patient chooses bed at      Patient to be transferred to [facility] on 12/29/14.  Patient to be transferred to facility by [facility]     Patient family notified on 12/29/14 of transfer.  Name of family member notified:  Patient and patient's youngest son notified at bedside.     PHYSICIAN       Additional  Comment:    _______________________________________________ Caroline Sauger, LCSW 12/29/2014, 3:59 PM [phone]

## 2014-12-29 NOTE — Progress Notes (Signed)
Peripherally Inserted Central Catheter/Midline Placement  The IV Nurse has discussed with the patient and/or persons authorized to consent for the patient, the purpose of this procedure and the potential benefits and risks involved with this procedure.  The benefits include less needle sticks, lab draws from the catheter and patient may be discharged home with the catheter.  Risks include, but not limited to, infection, bleeding, blood clot (thrombus formation), and puncture of an artery; nerve damage and irregular heat beat.  Alternatives to this procedure were also discussed.  PICC/Midline Placement Documentation        Darlyn Read 12/29/2014, 10:48 AM

## 2014-12-29 NOTE — Progress Notes (Signed)
Report called to [facility]. 

## 2014-12-29 NOTE — Discharge Planning (Signed)
Patient to be discharged to [facility]. Patient and patient's youngest son updated at bedside.  Facility: [facility] Report number: [id number] Transportation: EMS ([address]  [place]ubertha Say[place], [place] Cell: [phone]       Fax: [phone] Clinical Social Work: Orthopedics [phone]) and Surgical [phone])

## 2014-12-30 ENCOUNTER — Encounter (HOSPITAL_COMMUNITY): Payer: Self-pay | Admitting: Emergency Medicine

## 2014-12-30 ENCOUNTER — Emergency Department (HOSPITAL_COMMUNITY)
Admission: EM | Admit: 2014-12-30 | Discharge: 2014-12-30 | Disposition: A | Discharge status: Home / Self Care | Payer: Medicare Other | Attending: Emergency Medicine | Admitting: Emergency Medicine

## 2014-12-30 DIAGNOSIS — Z794 Long term (current) use of insulin: Secondary | ICD-10-CM | POA: Diagnosis not present

## 2014-12-30 DIAGNOSIS — I10 Essential (primary) hypertension: Secondary | ICD-10-CM | POA: Insufficient documentation

## 2014-12-30 DIAGNOSIS — Z79899 Other long term (current) drug therapy: Secondary | ICD-10-CM | POA: Diagnosis not present

## 2014-12-30 DIAGNOSIS — Z792 Long term (current) use of antibiotics: Secondary | ICD-10-CM | POA: Diagnosis not present

## 2014-12-30 DIAGNOSIS — K920 Hematemesis: Secondary | ICD-10-CM | POA: Insufficient documentation

## 2014-12-30 DIAGNOSIS — Z7982 Long term (current) use of aspirin: Secondary | ICD-10-CM | POA: Diagnosis not present

## 2014-12-30 DIAGNOSIS — Z8659 Personal history of other mental and behavioral disorders: Secondary | ICD-10-CM | POA: Insufficient documentation

## 2014-12-30 DIAGNOSIS — Z9049 Acquired absence of other specified parts of digestive tract: Secondary | ICD-10-CM | POA: Insufficient documentation

## 2014-12-30 DIAGNOSIS — K922 Gastrointestinal hemorrhage, unspecified: Secondary | ICD-10-CM | POA: Diagnosis present

## 2014-12-30 DIAGNOSIS — E785 Hyperlipidemia, unspecified: Secondary | ICD-10-CM | POA: Insufficient documentation

## 2014-12-30 DIAGNOSIS — E119 Type 2 diabetes mellitus without complications: Secondary | ICD-10-CM | POA: Diagnosis not present

## 2014-12-30 LAB — COMPREHENSIVE METABOLIC PANEL
ALT: 36 U/L — ABNORMAL HIGH (ref 0–35)
AST: 48 U/L — ABNORMAL HIGH (ref 0–37)
Albumin: 1.8 g/dL — ABNORMAL LOW (ref 3.5–5.2)
Alkaline Phosphatase: 65 U/L (ref 39–117)
Anion gap: 5 (ref 5–15)
BUN: 35 mg/dL — ABNORMAL HIGH (ref 6–23)
CO2: 21 mmol/L (ref 19–32)
Calcium: 7.6 mg/dL — ABNORMAL LOW (ref 8.4–10.5)
Chloride: 111 mmol/L (ref 96–112)
Creatinine, Ser: 1.44 mg/dL — ABNORMAL HIGH (ref 0.50–1.10)
GFR calc Af Amer: 38 mL/min — ABNORMAL LOW (ref >90)
GFR calc non Af Amer: 33 mL/min — ABNORMAL LOW (ref >90)
Glucose, Bld: 95 mg/dL (ref 70–99)
Potassium: 4 mmol/L (ref 3.5–5.1)
Sodium: 137 mmol/L (ref 135–145)
Total Bilirubin: 0.8 mg/dL (ref 0.3–1.2)
Total Protein: 5.7 g/dL — ABNORMAL LOW (ref 6.0–8.3)

## 2014-12-30 LAB — CBC
HCT: 35.1 % — ABNORMAL LOW (ref 36.0–46.0)
Hemoglobin: 11.4 g/dL — ABNORMAL LOW (ref 12.0–15.0)
MCH: 29.5 pg (ref 26.0–34.0)
MCHC: 32.5 g/dL (ref 30.0–36.0)
MCV: 90.7 fL (ref 78.0–100.0)
Platelets: 372 10*3/uL (ref 150–400)
RBC: 3.87 MIL/uL (ref 3.87–5.11)
RDW: 14.7 % (ref 11.5–15.5)
WBC: 9 10*3/uL (ref 4.0–10.5)

## 2014-12-30 MED ORDER — HEPARIN SOD (PORK) LOCK FLUSH 100 UNIT/ML IV SOLN
INTRAVENOUS | Status: AC
  Filled 2014-12-30: qty 5

## 2014-12-30 NOTE — ED Notes (Signed)
Pt has been given crackers & drink, no vomiting since arriving in the ED. Pt repositioned in the bed.

## 2014-12-30 NOTE — ED Notes (Signed)
Pt transported back to [facility]

## 2014-12-30 NOTE — ED Notes (Signed)
Attempted to call report to [facility].

## 2014-12-30 NOTE — ED Notes (Signed)
Report called to [facility]. To send someone to get pt.

## 2014-12-30 NOTE — ED Notes (Signed)
Pt brought over from [facility]. Staff reports pt vomited med amount of emesis with blood clots. Pt denies blood in stool. Pt was released from [facility] on Thursday afternoon.

## 2014-12-30 NOTE — ED Notes (Signed)
Pt states was eating got sick & noticed there was blood clots in emesis. Pt denies any pain or having this in the past.

## 2014-12-30 NOTE — ED Provider Notes (Signed)
CSN: [id number]     Arrival date & time 12/30/14  1846 History   First MD Initiated Contact with Patient 12/30/14 1900     Chief Complaint  Patient presents with  . GI Bleeding     (Consider location/radiation/quality/duration/timing/severity/associated sxs/prior Treatment) The history is provided by the patient, the nursing home and a relative.   patient is discharged from [facility] yesterday is a resident at the [facility]. At 5:00 this evening threw up 2 small clots of blood no pool or streaking of red blood. None since. No other complaints. No blood in the bowel movements. Patient was recently admitted at [facility] for a reoperation on her left knee for infection.  Past Medical History  Diagnosis Date  . Diabetes mellitus   . Hypertension   . HLD (hyperlipidemia)   . Depression    Past Surgical History  Procedure Laterality Date  . Cholecystectomy N/A 11/04/2012    Procedure: LAPAROSCOPIC CHOLECYSTECTOMY;  Surgeon: Jamesetta So, MD;  Location: [facility];  Service: General;  Laterality: N/A;  . Knee surgery    . I&d extremity Left 12/24/2014    Procedure: IRRIGATION AND DEBRIDEMENT EXTREMITY AND ARTHROSCOPY KNEE;  Surgeon: Renette Butters, MD;  Location: [facility];  Service: Orthopedics;  Laterality: Left;   Family History  Problem Relation Age of Onset  . Family history unknown: Yes   History  Substance Use Topics  . Smoking status: Never Smoker   . Smokeless tobacco: Not on file  . Alcohol Use: No   OB History    Gravida Para Term Preterm AB TAB SAB Ectopic Multiple Living   10 10 10       9      Review of Systems  Constitutional: Negative for fever.  HENT: Negative for congestion.   Eyes: Negative for redness.  Respiratory: Negative for shortness of breath.   Cardiovascular: Negative for chest pain.  Gastrointestinal: Positive for vomiting. Negative for nausea and abdominal pain.  Genitourinary: Negative for dysuria.  Musculoskeletal: Negative for back  pain.  Skin: Positive for wound.  Neurological: Negative for headaches.  Hematological: Does not bruise/bleed easily.  Psychiatric/Behavioral: Negative for confusion.      Allergies  Oxycodone  Home Medications   Prior to Admission medications   Medication Sig Start Date End Date Taking? Authorizing Provider  acetaminophen (TYLENOL) 325 MG tablet Take 650 mg by mouth every 6 (six) hours as needed for mild pain or moderate pain.    Yes Historical Provider, MD  Amino Acids-Protein Hydrolys (FEEDING SUPPLEMENT, PRO-STAT SUGAR FREE 64,) LIQD Take 30 mLs by mouth at bedtime.   Yes Historical Provider, MD  aspirin EC 81 MG tablet Take 81 mg by mouth every morning.    Yes Historical Provider, MD  atorvastatin (LIPITOR) 80 MG tablet Take 80 mg by mouth daily at 6 PM.   Yes Historical Provider, MD  diltiazem (CARDIZEM CD) 120 MG 24 hr capsule Take 1 capsule (120 mg total) by mouth daily. 12/29/14  Yes Hosie Poisson, MD  feeding supplement, GLUCERNA SHAKE, (GLUCERNA SHAKE) LIQD Take 237 mLs by mouth daily before breakfast.   Yes Historical Provider, MD  hydrocortisone (ANUSOL-HC) 25 MG suppository Place 1 suppository (25 mg total) rectally 2 (two) times daily. 12/29/14  Yes Hosie Poisson, MD  insulin aspart (NOVOLOG) 100 UNIT/ML injection Inject 0-9 Units into the skin 3 (three) times daily with meals. 12/29/14  Yes Hosie Poisson, MD  insulin glargine (LANTUS) 100 UNIT/ML injection Inject 0.2 mLs (  20 Units total) into the skin at bedtime. 12/29/14  Yes Hosie Poisson, MD  oxyCODONE-acetaminophen (PERCOCET/ROXICET) 5-325 MG per tablet Take 1 tablet by mouth every 4 (four) hours as needed for severe pain. 12/24/14  Yes Brittney Claiborne Billings, PA-C  rivaroxaban (XARELTO) 20 MG TABS tablet Take 1 tablet (20 mg total) by mouth daily with supper. 12/29/14  Yes Hosie Poisson, MD  vancomycin 1,500 mg in sodium chloride 0.9 % 500 mL Inject 1,500 mg into the vein daily. 12/29/14 01/21/15 Yes Hosie Poisson, MD   BP 121/57 mmHg   Pulse 95  Temp(Src) 98.4 F (36.9 C) (Oral)  Resp 18  Ht 5\' 7"  (1.702 m)  Wt 195 lb 8 oz (88.678 kg)  BMI 30.61 kg/m2  SpO2 100% Physical Exam  Constitutional: She is oriented to person, place, and time. She appears well-developed and well-nourished. No distress.  HENT:  Head: Normocephalic and atraumatic.  Mouth/Throat: Oropharynx is clear and moist.  Eyes: Conjunctivae and EOM are normal. Pupils are equal, round, and reactive to light.  Neck: Normal range of motion.  Cardiovascular: Normal rate and regular rhythm.   No murmur heard. Pulmonary/Chest: Effort normal and breath sounds normal. No respiratory distress.  Abdominal: Soft. Bowel sounds are normal. There is no tenderness.  Genitourinary: Guaiac negative stool.  Musculoskeletal: Normal range of motion.  Patient left leg is wrapped following the surgical procedure and has a knee brace on place.  Neurological: She is alert and oriented to person, place, and time. No cranial nerve deficit. She exhibits normal muscle tone. Coordination normal.  Skin: Skin is warm. No rash noted.  Nursing note and vitals reviewed.   ED Course  Procedures (including critical care time) Labs Review Labs Reviewed  CBC - Abnormal; Notable for the following:    Hemoglobin 11.4 (*)    HCT 35.1 (*)    All other components within normal limits  COMPREHENSIVE METABOLIC PANEL - Abnormal; Notable for the following:    BUN 35 (*)    Creatinine, Ser 1.44 (*)    Calcium 7.6 (*)    Total Protein 5.7 (*)    Albumin 1.8 (*)    AST 48 (*)    ALT 36 (*)    GFR calc non Af Amer 33 (*)    GFR calc Af Amer 38 (*)    All other components within normal limits   Results for orders placed or performed during the hospital encounter of 12/30/14  CBC  Result Value Ref Range   WBC 9.0 4.0 - 10.5 K/uL   RBC 3.87 3.87 - 5.11 MIL/uL   Hemoglobin 11.4 (L) 12.0 - 15.0 g/dL   HCT 35.1 (L) 36.0 - 46.0 %   MCV 90.7 78.0 - 100.0 fL   MCH 29.5 26.0 - 34.0 pg    MCHC 32.5 30.0 - 36.0 g/dL   RDW 14.7 11.5 - 15.5 %   Platelets 372 150 - 400 K/uL  Comprehensive metabolic panel  Result Value Ref Range   Sodium 137 135 - 145 mmol/L   Potassium 4.0 3.5 - 5.1 mmol/L   Chloride 111 96 - 112 mmol/L   CO2 21 19 - 32 mmol/L   Glucose, Bld 95 70 - 99 mg/dL   BUN 35 (H) 6 - 23 mg/dL   Creatinine, Ser 1.44 (H) 0.50 - 1.10 mg/dL   Calcium 7.6 (L) 8.4 - 10.5 mg/dL   Total Protein 5.7 (L) 6.0 - 8.3 g/dL   Albumin 1.8 (L) 3.5 - 5.2 g/dL  AST 48 (H) 0 - 37 U/L   ALT 36 (H) 0 - 35 U/L   Alkaline Phosphatase 65 39 - 117 U/L   Total Bilirubin 0.8 0.3 - 1.2 mg/dL   GFR calc non Af Amer 33 (L) >90 mL/min   GFR calc Af Amer 38 (L) >90 mL/min   Anion gap 5 5 - 15     Imaging Review No results found.   EKG Interpretation None      MDM   Final diagnoses:  Hematemesis without nausea    Patient from [facility]. Patient had about the 5:00 cuspid up to clots of blood. No pool of blood streaking of blood. No blood in the bowel movements. Patient heart rate around 100 but is been that way looking back on the records now for several days. Patient with recent hospitalization was discharged yesterday for surgery on a left knee infection. Patient otherwise has no complaints. Has no belly pain does not nausea or vomiting.  Patient's hemoglobin and hematocrit is very stable here as stated there is no sniffing change in her tachycardia there is no hypotension. Hemoccult was negative. Extra precaution required because patient is on several toe. The patient is now been observed for almost 4 hours since initial vomiting and there's been no further vomiting.  Patient stable to go back to the nursing facility. If she has any further vomiting of blood or blood in her bowel movements she can return for evaluation. Would recommend a repeat hemoglobin and hematocrit tomorrow.     Fredia Sorrow, MD 12/30/14 2044

## 2014-12-30 NOTE — Discharge Instructions (Signed)
Patient blood work here stable. No evidence of any significant GI bleed. Patient will require close observation this can be done at the [facility]. Would recommend the checking CBC for follow-up of the hemoglobin and hematocrit tomorrow. Any further bleeding at all patient needs to return for evaluation.

## 2014-12-30 NOTE — ED Notes (Signed)
Flushed picc line w/ 10 ml of mormal saline followed by 250 ml of heparin.

## 2014-12-30 NOTE — ED Notes (Signed)
MD at bedside. 

## 2014-12-31 ENCOUNTER — Encounter (HOSPITAL_COMMUNITY)
Admission: RE | Admit: 2014-12-31 | Discharge: 2014-12-31 | Disposition: A | Discharge status: Home / Self Care | Payer: Medicaid Other | Attending: Internal Medicine | Admitting: Internal Medicine

## 2014-12-31 LAB — CBC WITH DIFFERENTIAL/PLATELET
Basophils Absolute: 0 10*3/uL (ref 0.0–0.1)
Basophils Relative: 0 % (ref 0–1)
Eosinophils Absolute: 0.1 10*3/uL (ref 0.0–0.7)
Eosinophils Relative: 1 % (ref 0–5)
HCT: 28.7 % — ABNORMAL LOW (ref 36.0–46.0)
Hemoglobin: 10.1 g/dL — ABNORMAL LOW (ref 12.0–15.0)
Lymphocytes Relative: 16 % (ref 12–46)
Lymphs Abs: 1.3 10*3/uL (ref 0.7–4.0)
MCH: 32.8 pg (ref 26.0–34.0)
MCHC: 35.2 g/dL (ref 30.0–36.0)
MCV: 93.2 fL (ref 78.0–100.0)
Monocytes Absolute: 0.7 10*3/uL (ref 0.1–1.0)
Monocytes Relative: 8 % (ref 3–12)
Neutro Abs: 6.2 10*3/uL (ref 1.7–7.7)
Neutrophils Relative %: 75 % (ref 43–77)
Platelets: 304 10*3/uL (ref 150–400)
RBC: 3.08 MIL/uL — ABNORMAL LOW (ref 3.87–5.11)
RDW: 14.8 % (ref 11.5–15.5)
WBC: 8.3 10*3/uL (ref 4.0–10.5)

## 2014-12-31 LAB — CULTURE, BLOOD (ROUTINE X 2)
Culture: NO GROWTH
Culture: NO GROWTH

## 2015-01-03 ENCOUNTER — Telehealth: Payer: Self-pay | Admitting: Gastroenterology

## 2015-01-03 ENCOUNTER — Encounter (HOSPITAL_COMMUNITY)
Admission: RE | Admit: 2015-01-03 | Discharge: 2015-01-03 | Disposition: A | Discharge status: Home / Self Care | Payer: Medicare Other | Source: Skilled Nursing Facility | Attending: Internal Medicine | Admitting: Internal Medicine

## 2015-01-03 ENCOUNTER — Other Ambulatory Visit: Payer: Self-pay

## 2015-01-03 ENCOUNTER — Telehealth: Payer: Self-pay | Admitting: Internal Medicine

## 2015-01-03 DIAGNOSIS — R109 Unspecified abdominal pain: Secondary | ICD-10-CM

## 2015-01-03 DIAGNOSIS — K920 Hematemesis: Secondary | ICD-10-CM

## 2015-01-03 DIAGNOSIS — M009 Pyogenic arthritis, unspecified: Secondary | ICD-10-CM

## 2015-01-03 LAB — CBC WITH DIFFERENTIAL/PLATELET
Basophils Absolute: 0 10*3/uL (ref 0.0–0.1)
Basophils Relative: 0 % (ref 0–1)
Eosinophils Absolute: 0.1 10*3/uL (ref 0.0–0.7)
Eosinophils Relative: 1 % (ref 0–5)
HCT: 27.3 % — ABNORMAL LOW (ref 36.0–46.0)
Hemoglobin: 10.1 g/dL — ABNORMAL LOW (ref 12.0–15.0)
Lymphocytes Relative: 12 % (ref 12–46)
Lymphs Abs: 1.5 10*3/uL (ref 0.7–4.0)
MCH: 34.6 pg — ABNORMAL HIGH (ref 26.0–34.0)
MCHC: 37 g/dL — ABNORMAL HIGH (ref 30.0–36.0)
MCV: 93.5 fL (ref 78.0–100.0)
Monocytes Absolute: 0.7 10*3/uL (ref 0.1–1.0)
Monocytes Relative: 6 % (ref 3–12)
Neutro Abs: 9.7 10*3/uL — ABNORMAL HIGH (ref 1.7–7.7)
Neutrophils Relative %: 81 % — ABNORMAL HIGH (ref 43–77)
Platelets: 247 10*3/uL (ref 150–400)
RBC: 2.92 MIL/uL — ABNORMAL LOW (ref 3.87–5.11)
RDW: 15.4 % (ref 11.5–15.5)
WBC: 11.9 10*3/uL — ABNORMAL HIGH (ref 4.0–10.5)

## 2015-01-03 MED ORDER — SODIUM CHLORIDE 0.9 % IV SOLN: 6.0000 mg/kg | INTRAVENOUS | Status: DC

## 2015-01-03 NOTE — Telephone Encounter (Signed)
I was called this morning by Dr. Paticia Stack PA about Laura Davenport. 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 She was discharged to [facility] on IV vancomycin. She had a focal area of rash prior to discharge but apparently over this past weekend it became a diffuse rash and her vancomycin was stopped. I discussed the case with my partner, Dr. Linus Salmons, who saw her in the hospital. We decided to change her to IV daptomycin and treat to complete 4 weeks of therapy through 01/21/2015. I will arrange follow-up here.

## 2015-01-03 NOTE — Telephone Encounter (Signed)
Pt orders have been entered in Epic and instructions have been faxed to the [facility] to the attention of [facility] her nurse.   Pt is holding all blood thinner and asa products per Dr. Willey Blade.

## 2015-01-03 NOTE — Telephone Encounter (Signed)
Provided Inez Catalina, RN at [facility] with order form Dr. Megan Salon to start Daptomycin IV Via Picc, 6 mg/kg daily until 01/21/15 with weekly CK levels to be drawn and reported to Dr Scharlene Gloss.  Orders verbalized back.

## 2015-01-03 NOTE — Telephone Encounter (Signed)
PT HAVING HEMATEMESIS AND ABDOMINAL PAIN. AT [facility]. HE FELT SHE WOULD BENEFIT FROM EGD. STILL ON XARELTO. HOLD XARELTO. PLAN FOR EGD FRI MAY 6 AROUND NOON.

## 2015-01-04 DIAGNOSIS — K279 Peptic ulcer, site unspecified, unspecified as acute or chronic, without hemorrhage or perforation: Secondary | ICD-10-CM | POA: Diagnosis not present

## 2015-01-05 ENCOUNTER — Encounter (HOSPITAL_COMMUNITY)
Admission: RE | Admit: 2015-01-05 | Discharge: 2015-01-05 | Disposition: A | Discharge status: Home / Self Care | Payer: Medicare Other | Source: Skilled Nursing Facility | Attending: Internal Medicine | Admitting: Internal Medicine

## 2015-01-05 LAB — CK: Total CK: 61 U/L (ref 38–234)

## 2015-01-06 ENCOUNTER — Ambulatory Visit (HOSPITAL_COMMUNITY)
Admission: RE | Admit: 2015-01-06 | Discharge: 2015-01-06 | Disposition: A | Discharge status: Home / Self Care | Payer: Medicare Other | Source: Ambulatory Visit | Attending: Gastroenterology | Admitting: Gastroenterology

## 2015-01-06 ENCOUNTER — Encounter (HOSPITAL_COMMUNITY): Payer: Self-pay | Admitting: *Deleted

## 2015-01-06 ENCOUNTER — Encounter (HOSPITAL_COMMUNITY)
Admission: RE | Disposition: A | Discharge status: Home / Self Care | Payer: Self-pay | Source: Ambulatory Visit | Attending: Gastroenterology

## 2015-01-06 DIAGNOSIS — R112 Nausea with vomiting, unspecified: Secondary | ICD-10-CM

## 2015-01-06 DIAGNOSIS — Z9049 Acquired absence of other specified parts of digestive tract: Secondary | ICD-10-CM | POA: Diagnosis not present

## 2015-01-06 DIAGNOSIS — R109 Unspecified abdominal pain: Secondary | ICD-10-CM | POA: Insufficient documentation

## 2015-01-06 DIAGNOSIS — K920 Hematemesis: Secondary | ICD-10-CM | POA: Diagnosis present

## 2015-01-06 DIAGNOSIS — Z79899 Other long term (current) drug therapy: Secondary | ICD-10-CM | POA: Insufficient documentation

## 2015-01-06 DIAGNOSIS — Z7901 Long term (current) use of anticoagulants: Secondary | ICD-10-CM | POA: Diagnosis not present

## 2015-01-06 DIAGNOSIS — I10 Essential (primary) hypertension: Secondary | ICD-10-CM | POA: Insufficient documentation

## 2015-01-06 DIAGNOSIS — Z794 Long term (current) use of insulin: Secondary | ICD-10-CM | POA: Insufficient documentation

## 2015-01-06 DIAGNOSIS — K2950 Unspecified chronic gastritis without bleeding: Secondary | ICD-10-CM | POA: Insufficient documentation

## 2015-01-06 DIAGNOSIS — Z7982 Long term (current) use of aspirin: Secondary | ICD-10-CM | POA: Insufficient documentation

## 2015-01-06 DIAGNOSIS — E119 Type 2 diabetes mellitus without complications: Secondary | ICD-10-CM

## 2015-01-06 DIAGNOSIS — Z7952 Long term (current) use of systemic steroids: Secondary | ICD-10-CM | POA: Diagnosis not present

## 2015-01-06 DIAGNOSIS — E785 Hyperlipidemia, unspecified: Secondary | ICD-10-CM | POA: Diagnosis not present

## 2015-01-06 DIAGNOSIS — Z79891 Long term (current) use of opiate analgesic: Secondary | ICD-10-CM | POA: Diagnosis not present

## 2015-01-06 DIAGNOSIS — R1310 Dysphagia, unspecified: Secondary | ICD-10-CM

## 2015-01-06 DIAGNOSIS — K269 Duodenal ulcer, unspecified as acute or chronic, without hemorrhage or perforation: Secondary | ICD-10-CM | POA: Diagnosis not present

## 2015-01-06 DIAGNOSIS — K2980 Duodenitis without bleeding: Secondary | ICD-10-CM | POA: Insufficient documentation

## 2015-01-06 DIAGNOSIS — K2900 Acute gastritis without bleeding: Secondary | ICD-10-CM | POA: Diagnosis not present

## 2015-01-06 DIAGNOSIS — Z792 Long term (current) use of antibiotics: Secondary | ICD-10-CM | POA: Insufficient documentation

## 2015-01-06 DIAGNOSIS — K209 Esophagitis, unspecified: Secondary | ICD-10-CM | POA: Diagnosis not present

## 2015-01-06 DIAGNOSIS — R1013 Epigastric pain: Secondary | ICD-10-CM | POA: Diagnosis present

## 2015-01-06 HISTORY — PX: ESOPHAGOGASTRODUODENOSCOPY: SHX5428

## 2015-01-06 HISTORY — PX: SAVORY DILATION: SHX5439

## 2015-01-06 LAB — GLUCOSE, CAPILLARY: Glucose-Capillary: 128 mg/dL — ABNORMAL HIGH (ref 70–99)

## 2015-01-06 SURGERY — EGD (ESOPHAGOGASTRODUODENOSCOPY)
Anesthesia: Moderate Sedation

## 2015-01-06 MED ORDER — SODIUM CHLORIDE 0.9 % IV SOLN
INTRAVENOUS | Status: DC
  Administered 2015-01-06: 10:00:00 via INTRAVENOUS

## 2015-01-06 MED ORDER — MIDAZOLAM HCL 5 MG/5ML IJ SOLN
INTRAMUSCULAR | Status: AC
  Filled 2015-01-06: qty 10

## 2015-01-06 MED ORDER — MEPERIDINE HCL 100 MG/ML IJ SOLN
INTRAMUSCULAR | Status: AC
  Filled 2015-01-06: qty 2

## 2015-01-06 MED ORDER — LIDOCAINE VISCOUS 2 % MT SOLN
OROMUCOSAL | Status: AC
  Filled 2015-01-06: qty 15

## 2015-01-06 MED ORDER — LIDOCAINE VISCOUS 2 % MT SOLN
OROMUCOSAL | Status: DC | PRN
  Administered 2015-01-06: 4 mL via OROMUCOSAL

## 2015-01-06 MED ORDER — MEPERIDINE HCL 100 MG/ML IJ SOLN
INTRAMUSCULAR | Status: DC | PRN
  Administered 2015-01-06: 25 mg

## 2015-01-06 MED ORDER — STERILE WATER FOR IRRIGATION IR SOLN
Status: DC | PRN
  Administered 2015-01-06: 12:00:00

## 2015-01-06 MED ORDER — MIDAZOLAM HCL 5 MG/5ML IJ SOLN
INTRAMUSCULAR | Status: DC | PRN
  Administered 2015-01-06 (×3): 1 mg via INTRAVENOUS

## 2015-01-06 MED ORDER — ONDANSETRON HCL 4 MG PO TABS: ORAL_TABLET | ORAL | Status: AC

## 2015-01-06 MED ORDER — MINERAL OIL PO OIL
TOPICAL_OIL | ORAL | Status: AC
  Filled 2015-01-06: qty 30

## 2015-01-06 NOTE — Discharge Instructions (Signed)
I dilated your esophagus DUE TO YOUR TROUBLE WITH VOMITING. YOU HAVE SEVERE GASTRITIS, DUODENITIS, AND SMALL BOWEL ULCERS MOST LIKELY DUE TO ASPIRIN USE. ALL OF THESE WILL CAUSE NAUSEA AND VOMITING. I biopsied your stomach AND SMALL BOWEL.   CONTINUE PROTONIX. TAKE 30 MINUTES PRIOR TO MEALS TWICE DAILY FOR 3 MOS THEN ONCE DAILY FOREVER.  HOLD ASPIRIN. RESTART ON MAY 21.  [place] RE-START MAY 14.  YOUR BIOPSY RESULTS WILL BE AVAILABLE IN MY CHART AFTER MAY 9 AND MY OFFICE WILL CONTACT YOU IN 10-14 DAYS WITH YOUR RESULTS.   FOLLOW UP IN 4 MOS.     UPPER ENDOSCOPY AFTER CARE Read the instructions outlined below and refer to this sheet in the next week. These discharge instructions provide you with general information on caring for yourself after you leave the hospital. While your treatment has been planned according to the most current medical practices available, unavoidable complications occasionally occur. If you have any problems or questions after discharge, call DR. Javan Gonzaga, [phone].  ACTIVITY  You may resume your regular activity, but move at a slower pace for the next 24 hours.   Take frequent rest periods for the next 24 hours.   Walking will help get rid of the air and reduce the bloated feeling in your belly (abdomen).   No driving for 24 hours (because of the medicine (anesthesia) used during the test).   You may shower.   Do not sign any important legal documents or operate any machinery for 24 hours (because of the anesthesia used during the test).    NUTRITION  Drink plenty of fluids.   You may resume your normal diet as instructed by your doctor.   Begin with a light meal and progress to your normal diet. Heavy or fried foods are harder to digest and may make you feel sick to your stomach (nauseated).   Avoid alcoholic beverages for 24 hours or as instructed.    MEDICATIONS  You may resume your normal medications.   WHAT YOU CAN EXPECT  TODAY  Some feelings of bloating in the abdomen.   Passage of more gas than usual.    IF YOU HAD A BIOPSY TAKEN DURING THE UPPER ENDOSCOPY:  No aspirin products for 14 days.   Eat a soft diet IF YOU HAVE NAUSEA, BLOATING, ABDOMINAL PAIN, OR VOMITING.    FINDING OUT THE RESULTS OF YOUR TEST Not all test results are available during your visit. DR. Oneida Alar WILL CALL YOU WITHIN 14 DAYS OF YOUR PROCEDUE WITH YOUR RESULTS. Do not assume everything is normal if you have not heard from DR. Kadee Philyaw, CALL HER OFFICE AT [phone].  SEEK IMMEDIATE MEDICAL ATTENTION AND CALL THE OFFICE: [phone] IF:  You have more than a spotting of blood in your stool.   Your belly is swollen (abdominal distention).   You are nauseated or vomiting.   You have a temperature over 101F.   You have abdominal pain or discomfort that is severe or gets worse throughout the day.  Ulcer Disease (Peptic Ulcer, Gastric Ulcer, Duodenal Ulcer) You have an ulcer. This may be in your stomach (gastric ulcer) or in the first part of your small bowel, the duodenum (duodenal ulcer). An ulcer is a break in the lining of the stomach or duodenum. The ulcer causes erosion into the deeper tissue.  CAUSES The stomach has a lining to protect itself from the acid that digests food. The lining can be damaged in two main ways:  The Helico Pylori bacteria (H. Pyolori) can infect the lining of the stomach and cause ulcers.   ASPIRIN/Nonsteroidal, anti-inflammatory medications (NSAIDS) can cause gastric ulcerations.   Smoking tobacco can increase the acid in the stomach. This can lead to ulcers, and will impair healing of ulcers.  Other factors, such as alcohol use and stress may contribute to ulcer formation. Rarely, a tumor or cancer can cause an ulcer.   SYMPTOMS The problems (symptoms) of ulcer disease are usually a burning or gnawing of the mid-upper belly (abdomen). This is often worse on an empty stomach and may get  better with food. This may be associated with feeling sick to your stomach (nausea), bloating, and vomiting. If the ulcer results in bleeding, it can cause:  Black, tarry stools.   Vomiting of bright red blood.   Vomiting coffee-ground-looking materials.   SEVERE ANEMIA   ULCERS  TREATMENT There are a number of medicines used to treat ULCERS including: Antacids.  Proton-pump inhibitors: PROTONIX

## 2015-01-06 NOTE — Op Note (Signed)
[facility] [address] [place], [address]   ENDOSCOPY PROCEDURE REPORT  PATIENT: Laura Davenport, Laura Davenport  MR#: [id number] BIRTHDATE: [birth year] , 84  yrs. old GENDER: female  ENDOSCOPIST: Danie Binder, MD REFFERED Twanna Hy, M.D.  PROCEDURE DATE:  01/09/2015 PROCEDURE:   EGD with biopsy and EGD with dilatation over guidewire   INDICATIONS:1.  dysphagia.   2.  dyspepsia.   3.  epigastric pain. 4.  nausea and vomiting. MEDICATIONS: Demerol 25 mg IV and Versed 3 mg IV TOPICAL ANESTHETIC: Viscous Xylocaine  DESCRIPTION OF PROCEDURE:   After the risks benefits and alternatives of the procedure were thoroughly explained, informed consent was obtained.  The [id number]  endoscope was introduced through the mouth and advanced to the second portion of the duodenum. The instrument was slowly withdrawn as the mucosa was carefully examined.  Prior to withdrawal of the scope, the guidwire was placed.  The esophagus was dilated successfully.  The patient was recovered in endoscopy and discharged home in satisfactory condition.   ESOPHAGUS: MILD ERYTHEMA AND EDEMA IN DISTAL ESOPHAGUS.  NL GE JXN. STOMACH: Severe erosive gastritis (inflammation) was found in the entire examined stomach.   DUODENUM: Moderate duodenal inflammation was found in the 2nd part of the duodenum and duodenal bulb. Multiple medium sized non-bleeding clean-based and deep ulcers with surrounding edema were found in the 2nd part of the duodenum. COLD FORCEPS BIOPSIES OBTAINED.  Dilation was then performed at the gastroesphageal junction Dilator: Savary over guidewire Size(s): 12.8-16 MM.  Resistance: minimal Heme: yes-TRACE  COMPLICATIONS: There were no immediate complications.  ENDOSCOPIC IMPRESSION: 1.   MILD ESOPHAGITIS 2.   HEMATEMESIS/NASUEA/ANOREXIA/ABDOMINAL PAIN DUE TO SEVERE Erosive gastritis, MODERATE DUODENITIS, AND MULIPLPE DUODENAL ULCERS  RECOMMENDATIONS: CARD MODIFIED  DIET. CONTINUE PROTONIX.  TAKE 30 MINUTES PRIOR TO MEALS TWICE DAILY FOR 3 MOS THEN ONCE DAILY FOREVER. HOLD ASPIRIN.  RESTART ON MAY 21. [place] RE-START MAY 14. AWAIT BIOPSY RESULTS. FOLLOW UP IN 4 MOS.   eSigned:  Danie Binder, MD 01/09/15 12:19 PM   CPT CODES: ICD CODES:  The ICD and CPT codes recommended by this software are interpretations from the data that the clinical staff has captured with the software.  The verification of the translation of this report to the ICD and CPT codes and modifiers is the sole responsibility of the health care institution and practicing physician where this report was generated.  [employer] will not be held responsible for the validity of the ICD and CPT codes included on this report.  AMA assumes no liability for data contained or not contained herein. CPT is a Designer, television/film set of the [employer].

## 2015-01-06 NOTE — H&P (Signed)
Primary Care Physician:  [name], MD Primary Gastroenterologist:  Dr. Oneida Alar  Pre-Procedure History & Physical: HPI:  Laura Davenport is a 81 y.o. female here for HEMATEMESIS on Xarelto.  Past Medical History  Diagnosis Date  . Diabetes mellitus   . Hypertension   . HLD (hyperlipidemia)   . Depression     Past Surgical History  Procedure Laterality Date  . Cholecystectomy N/A 11/04/2012    Procedure: LAPAROSCOPIC CHOLECYSTECTOMY;  Surgeon: Jamesetta So, MD;  Location: [facility];  Service: General;  Laterality: N/A;  . Knee surgery    . I&d extremity Left 12/24/2014    Procedure: IRRIGATION AND DEBRIDEMENT EXTREMITY AND ARTHROSCOPY KNEE;  Surgeon: Renette Butters, MD;  Location: [facility];  Service: Orthopedics;  Laterality: Left;    Prior to Admission medications   Medication Sig Start Date End Date Taking? Authorizing Provider  atorvastatin (LIPITOR) 80 MG tablet Take 80 mg by mouth daily at 6 PM.   Yes Historical Provider, MD  diltiazem (CARDIZEM CD) 120 MG 24 hr capsule Take 1 capsule (120 mg total) by mouth daily. 12/29/14  Yes Hosie Poisson, MD  feeding supplement, GLUCERNA SHAKE, (GLUCERNA SHAKE) LIQD Take 237 mLs by mouth daily before breakfast.   Yes Historical Provider, MD  insulin aspart (NOVOLOG) 100 UNIT/ML injection Inject 0-9 Units into the skin 3 (three) times daily with meals. 12/29/14  Yes Hosie Poisson, MD  oxyCODONE-acetaminophen (PERCOCET/ROXICET) 5-325 MG per tablet Take 1 tablet by mouth every 4 (four) hours as needed for severe pain. 12/24/14  Yes Brittney Claiborne Billings, PA-C  pantoprazole (PROTONIX) 40 MG tablet Take 40 mg by mouth daily.   Yes Historical Provider, MD  acetaminophen (TYLENOL) 325 MG tablet Take 650 mg by mouth every 6 (six) hours as needed for mild pain or moderate pain.     Historical Provider, MD  Amino Acids-Protein Hydrolys (FEEDING SUPPLEMENT, PRO-STAT SUGAR FREE 64,) LIQD Take 30 mLs by mouth at bedtime.    Historical Provider, MD  aspirin EC 81 MG  tablet Take 81 mg by mouth every morning.     Historical Provider, MD  DAPTOmycin 532 mg in sodium chloride 0.9 % 100 mL Inject 532 mg into the vein daily. 01/03/15   Michel Bickers, MD  hydrocortisone (ANUSOL-HC) 25 MG suppository Place 1 suppository (25 mg total) rectally 2 (two) times daily. 12/29/14   Hosie Poisson, MD  insulin glargine (LANTUS) 100 UNIT/ML injection Inject 0.2 mLs (20 Units total) into the skin at bedtime. 12/29/14   Hosie Poisson, MD  rivaroxaban (XARELTO) 20 MG TABS tablet Take 1 tablet (20 mg total) by mouth daily with supper. 12/29/14   Hosie Poisson, MD    Allergies as of 01/03/2015 - Review Complete 01/03/2015  Allergen Reaction Noted  . Oxycodone Hives 12/30/2014    Family History  Problem Relation Age of Onset  . Family history unknown: Yes    History   Social History  . Marital Status: Widowed    Spouse Name: N/A  . Number of Children: 92  . Years of Education: N/A   Occupational History  . retired; [employer] History Main Topics  . Smoking status: Never Smoker   . Smokeless tobacco: Not on file  . Alcohol Use: No  . Drug Use: No  . Sexual Activity: Not on file   Other Topics Concern  . Not on file   Social History Narrative   Lives alone   3 daughters & granddaughter nearby  Lost 1 son-strep          Review of Systems: See HPI, otherwise negative ROS   Physical Exam: Pulse 112  Resp 14  SpO2 100% General:   Alert,  pleasant and cooperative in NAD Head:  Normocephalic and atraumatic. Neck:  Supple; Lungs:  Clear throughout to auscultation.    Heart:  Regular rate and rhythm. Abdomen:  Soft, nontender and nondistended. Normal bowel sounds, without guarding, and without rebound.   Neurologic:  Alert and  oriented x4;  grossly normal neurologically.  Impression/Plan:    HEMATEMESIS  PLAN:  EGD TODAY

## 2015-01-08 ENCOUNTER — Encounter (HOSPITAL_COMMUNITY)
Admission: RE | Admit: 2015-01-08 | Discharge: 2015-01-08 | Disposition: A | Discharge status: Home / Self Care | Payer: Medicare Other | Attending: Internal Medicine | Admitting: Internal Medicine

## 2015-01-08 LAB — CLOSTRIDIUM DIFFICILE BY PCR: Toxigenic C. Difficile by PCR: NEGATIVE

## 2015-01-09 ENCOUNTER — Encounter (HOSPITAL_COMMUNITY): Payer: Self-pay | Admitting: Gastroenterology

## 2015-01-09 DIAGNOSIS — S82092D Other fracture of left patella, subsequent encounter for closed fracture with routine healing: Secondary | ICD-10-CM | POA: Diagnosis not present

## 2015-01-12 ENCOUNTER — Encounter (HOSPITAL_COMMUNITY)
Admission: RE | Admit: 2015-01-12 | Discharge: 2015-01-12 | Disposition: A | Discharge status: Home / Self Care | Payer: Medicare Other | Source: Skilled Nursing Facility | Attending: Internal Medicine | Admitting: Internal Medicine

## 2015-01-12 LAB — CK: Total CK: 267 U/L — ABNORMAL HIGH (ref 38–234)

## 2015-01-13 DIAGNOSIS — K279 Peptic ulcer, site unspecified, unspecified as acute or chronic, without hemorrhage or perforation: Secondary | ICD-10-CM | POA: Diagnosis not present

## 2015-01-16 ENCOUNTER — Telehealth: Payer: Self-pay | Admitting: *Deleted

## 2015-01-16 ENCOUNTER — Encounter (HOSPITAL_COMMUNITY)
Admission: RE | Admit: 2015-01-16 | Discharge: 2015-01-16 | Disposition: A | Discharge status: Home / Self Care | Payer: Medicare Other | Source: Skilled Nursing Facility | Attending: Internal Medicine | Admitting: Internal Medicine

## 2015-01-16 LAB — CK: Total CK: 1813 U/L — ABNORMAL HIGH (ref 38–234)

## 2015-01-16 NOTE — Telephone Encounter (Signed)
Received Lab work - CK results = 267, 01/12/15.  RN spoke with Dr. Scharlene Gloss and shared CK information.  Received verbal order from Dr. Linus Salmons; STAT recheck of CK, if still elevated D/C Daptomycin.  Order given to RN on [employer] at [facility] and verbalized back.  STAT results to be call for Dr. Linus Salmons.

## 2015-01-16 NOTE — Telephone Encounter (Signed)
Faxed copy of CK results received.  Shared CK = 1813 with Dr. Linus Salmons.  Daptomycin was already stopped by [facility] per previous order.  PICC line may be d/ced per Dr. Linus Salmons. Verbal order for PICC line removal given to staff at [facility].  Verbalized back the order.

## 2015-01-17 ENCOUNTER — Encounter (HOSPITAL_COMMUNITY)
Admission: RE | Admit: 2015-01-17 | Discharge: 2015-01-17 | Disposition: A | Discharge status: Home / Self Care | Payer: Medicare Other | Source: Skilled Nursing Facility | Attending: Internal Medicine | Admitting: Internal Medicine

## 2015-01-17 LAB — URINALYSIS, ROUTINE W REFLEX MICROSCOPIC
Glucose, UA: NEGATIVE mg/dL
Ketones, ur: NEGATIVE mg/dL
Nitrite: NEGATIVE
Protein, ur: 100 mg/dL — AB
Specific Gravity, Urine: 1.02 (ref 1.005–1.030)
Urobilinogen, UA: 0.2 mg/dL (ref 0.0–1.0)
pH: 7.5 (ref 5.0–8.0)

## 2015-01-17 LAB — URINE MICROSCOPIC-ADD ON

## 2015-01-18 ENCOUNTER — Ambulatory Visit (INDEPENDENT_AMBULATORY_CARE_PROVIDER_SITE_OTHER): Payer: Medicare Other | Admitting: Cardiology

## 2015-01-18 ENCOUNTER — Encounter: Payer: Self-pay | Admitting: Cardiology

## 2015-01-18 ENCOUNTER — Telehealth: Payer: Self-pay | Admitting: Gastroenterology

## 2015-01-18 VITALS — BP 104/56 | HR 69 | Ht 67.0 in | Wt 201.0 lb

## 2015-01-18 DIAGNOSIS — Z8719 Personal history of other diseases of the digestive system: Secondary | ICD-10-CM | POA: Diagnosis not present

## 2015-01-18 DIAGNOSIS — E785 Hyperlipidemia, unspecified: Secondary | ICD-10-CM

## 2015-01-18 DIAGNOSIS — I10 Essential (primary) hypertension: Secondary | ICD-10-CM | POA: Diagnosis not present

## 2015-01-18 DIAGNOSIS — I471 Supraventricular tachycardia: Secondary | ICD-10-CM | POA: Diagnosis not present

## 2015-01-18 NOTE — Patient Instructions (Signed)
Your physician recommends that you schedule a follow-up appointment in: 3 months with Dr.McDowell   STOP Xarelto     Thank you for choosing [facility] !

## 2015-01-18 NOTE — Telephone Encounter (Signed)
Called, could not leave a message. Letter mailed to call.

## 2015-01-18 NOTE — Telephone Encounter (Signed)
Please call pt. HER stomach Bx shows SEVERE EROSIVE GASTRITIS AND DUODENITIS FROM ASA USE.     CONTINUE [place]. TAKE 30 MINUTES PRIOR TO MEALS TWICE DAILY FOR 3 MOS THEN ONCE DAILY FOREVER.  SHE SHOULD'VE STARTED [place] ON MAY 14.  RESTART ASPIRIN ON MAY 21.  FOLLOW UP IN 4 MOS E30 UGIB/DYSPHAGIA.Marland Kitchen

## 2015-01-18 NOTE — Telephone Encounter (Signed)
ON RECALL LIST  °

## 2015-01-18 NOTE — Progress Notes (Signed)
Cardiology Office Note  Date: 01/18/2015   ID: Laura Davenport, DOB [birth year], MRN [id number]  PCP: [name], MD  Primary Cardiologist: Rozann Lesches, MD   Chief Complaint  Patient presents with  . History of atrial fibrillation    History of Present Illness: Laura Davenport is an 81 y.o. female referred for cardiology consultation by Dr. Karleen Hampshire with the hospitalist team after recent hospitalization at [facility] in April. Primary care provider is Dr. Willey Blade. I reviewed the records. She was admitted at that time with sepsis in association with left knee septic joint, MRSA by blood cultures, status post arthroscopy with knee hardware removal, treat with antibiotics per the infectious disease service. She was noted to have reported transient atrial fibrillation in the setting of acute illness with minor troponin I elevations of 0.04 and 0.06 consistent with demand ischemia, LVEF was normal by echocardiogram with no focal wall motion abnormalities. She was placed on Xarelto by the hospitalist team for stroke prophylaxis with CHADSVASC score of 5.  More recently she has been at the [facility] for rehabilitation, I note that she was found to have hematemesis and abdominal pain in early May and referred for EGD with Dr. Oneida Alar. Xarelto was held at that time. Study showed mild esophagitis with severe erosive gastritis, moderate duodenitis, and multiple duodenal ulcers. She is now back on Xarelto.  I reviewed the available telemetry strips and ECGs over the last few months and compared them to old tracings. She has had an intermittently documented SVT, however this has been regular and more consistent with a reentrant tachycardia rather than atrial fibrillation. I discussed this with her today.  She does not endorse any sense of palpitations or chest pain.  States that she has had some blood in her stools recently.    Past Medical History  Diagnosis Date  . Type 2 diabetes mellitus   .  Essential hypertension   . HLD (hyperlipidemia)   . Depression   . Septic arthritis     MRSA, left knee   . GI bleed     Severe gastritis and duodenal ulcers by EGD May 2016     Past Surgical History  Procedure Laterality Date  . Cholecystectomy N/A 11/04/2012    Procedure: LAPAROSCOPIC CHOLECYSTECTOMY;  Surgeon: Jamesetta So, MD;  [facility]: [facility];  Service: General;  Laterality: N/A;  . Knee surgery    . I&d extremity Left 12/24/2014    Procedure: IRRIGATION AND DEBRIDEMENT EXTREMITY AND ARTHROSCOPY KNEE;  Surgeon: Renette Butters, MD;  Location: [place];  Service: Orthopedics;  Laterality: Left;  . Esophagogastroduodenoscopy N/A 01/06/2015    Procedure: ESOPHAGOGASTRODUODENOSCOPY (EGD);  Surgeon: Danie Binder, MD;  [facility];  Service: Endoscopy;  Laterality: N/A;  1130  . Savory dilation  01/06/2015    Procedure: SAVORY DILATION;  Surgeon: Danie Binder, MD;  [facility];  Service: Endoscopy;;    Current Outpatient Prescriptions  Medication Sig Dispense Refill  . acetaminophen (TYLENOL) 325 MG tablet Take 650 mg by mouth every 6 (six) hours as needed for mild pain or moderate pain.     . Amino Acids-Protein Hydrolys (FEEDING SUPPLEMENT, PRO-STAT SUGAR FREE 64,) LIQD Take 30 mLs by mouth at bedtime.    Marland Kitchen atorvastatin (LIPITOR) 80 MG tablet Take 80 mg by mouth daily at 6 PM.    . DAPTOmycin 532 mg in sodium chloride 0.9 % 100 mL Inject 532 mg into the vein daily.    Marland Kitchen  diltiazem (CARDIZEM CD) 120 MG 24 hr capsule Take 1 capsule (120 mg total) by mouth daily. 30 capsule 0  . feeding supplement, GLUCERNA SHAKE, (GLUCERNA SHAKE) LIQD Take 237 mLs by mouth daily before breakfast.    . hydrocortisone (ANUSOL-HC) 25 MG suppository Place 1 suppository (25 mg total) rectally 2 (two) times daily. 12 suppository 0  . insulin aspart (NOVOLOG) 100 UNIT/ML injection Inject 0-9 Units into the skin 3 (three) times daily with meals. 10 mL 11  . insulin glargine (LANTUS) 100  UNIT/ML injection Inject 0.2 mLs (20 Units total) into the skin at bedtime. 10 mL 11  . oxyCODONE-acetaminophen (PERCOCET/ROXICET) 5-325 MG per tablet Take 1 tablet by mouth every 4 (four) hours as needed for severe pain. 30 tablet 0  . pantoprazole (PROTONIX) 40 MG tablet Take 40 mg by mouth daily.     No current facility-administered medications for this visit.    Allergies:  Oxycodone   Social History: The patient  reports that she has never smoked. She does not have any smokeless tobacco history on file. She reports that she does not drink alcohol or use illicit drugs.   Family History: The patient's Family history is unknown by patient.   ROS:  Please see the history of present illness. Otherwise, complete review of systems is positive for fatigue, limited mobility.  All other systems are reviewed and negative.   Physical Exam: VS:  BP 104/56 mmHg  Pulse 69  Ht 5\' 7"  (1.702 m)  Wt 201 lb (91.173 kg)  BMI 31.47 kg/m2  SpO2 99%, BMI Body mass index is 31.47 kg/(m^2).  Wt Readings from Last 3 Encounters:  01/18/15 201 lb (91.173 kg)  12/30/14 195 lb 8 oz (88.678 kg)  12/22/14 195 lb 5.2 oz (88.6 kg)     General: Patient in wheelchair, appears comfortable at rest. HEENT: Conjunctiva and lids normal, oropharynx clear with moist mucosa. Neck: Supple, no elevated JVP or carotid bruits, no thyromegaly. Lungs: Clear to auscultation, nonlabored breathing at rest. Cardiac: Regular rate and rhythm, no S3 or significant systolic murmur. Abdomen: Soft, nontender, bowel sounds present. Extremities: Brace on right leg/knee, distal pulses 2+. Skin: Warm and dry. Musculoskeletal: No kyphosis. Neuropsychiatric: Alert and oriented x3, affect grossly appropriate.   ECG: Recent tracings reviewed as well as those from 2014.  Recent Labwork: 12/23/2014: B Natriuretic Peptide 154.3* 12/30/2014: ALT 36*; AST 48*; BUN 35*; Creatinine 1.44*; Potassium 4.0; Sodium 137 01/03/2015: Hemoglobin 10.1*;  Platelets 247     Component Value Date/Time   CHOL 76 12/23/2014 0310   TRIG 30 12/23/2014 0310   HDL 30* 12/23/2014 0310   CHOLHDL 2.5 12/23/2014 0310   VLDL 6 12/23/2014 0310   LDLCALC 40 12/23/2014 0310    Other Studies Reviewed Today:  Echocardiogram 12/23/2014: Study Conclusions  - Left ventricle: The cavity size was normal. Wall thickness was increased in a pattern of moderate LVH. Systolic function was normal. The estimated ejection fraction was in the range of 55% to 60%. Wall motion was normal; there were no regional wall motion abnormalities. Doppler parameters are consistent with abnormal left ventricular relaxation (grade 1 diastolic dysfunction). - Mitral valve: There was mild regurgitation.   ASSESSMENT AND PLAN:  1. PSVT based on review of ECGs and telemetry. Narrow complex regular rhythm is more consistent with a reentrant tachycardia than atrial fibrillation.  I have discussed this with her, would continue Cardizem CD, and stop Xarelto.  2. Essential hypertension, blood pressure well controlled today.  3. Hyperlipidemia,  on Lipitor.  4. Recent GI bleeding, followed by Dr. Oneida Alar.  Current medicines were reviewed at length with the patient today.   Disposition: FU with me in 3 months.   Signed, Satira Sark, MD, [facility] 01/18/2015 3:08 PM    [place] [facility] [address] [place], [place] [address] Phone: [phone]; Fax: [phone]

## 2015-01-19 ENCOUNTER — Encounter (HOSPITAL_COMMUNITY)
Admission: AD | Admit: 2015-01-19 | Discharge: 2015-01-19 | Disposition: A | Discharge status: Home / Self Care | Payer: Medicare Other | Source: Skilled Nursing Facility | Attending: Internal Medicine | Admitting: Internal Medicine

## 2015-01-19 ENCOUNTER — Ambulatory Visit (INDEPENDENT_AMBULATORY_CARE_PROVIDER_SITE_OTHER): Payer: Medicare Other | Admitting: Internal Medicine

## 2015-01-19 ENCOUNTER — Encounter: Payer: Self-pay | Admitting: Internal Medicine

## 2015-01-19 VITALS — BP 102/67 | HR 86 | Temp 97.7°F

## 2015-01-19 DIAGNOSIS — M009 Pyogenic arthritis, unspecified: Secondary | ICD-10-CM | POA: Diagnosis not present

## 2015-01-19 LAB — CK: Total CK: 878 U/L — ABNORMAL HIGH (ref 38–234)

## 2015-01-19 NOTE — Assessment & Plan Note (Signed)
She seems to have done well clinically with her treatment and had good response. No indication to continue antibiotics. She can return on an when necessary basis.

## 2015-01-19 NOTE — Progress Notes (Signed)
   Subjective:    Patient ID: Laura Davenport, female    DOB: 1931/05/15, 81 y.o.   MRN: [id number]  HPI Laura Davenport is a 81 y.o. female with recent fractured patella and surgery with hardware placement at [facility] who was recovering at [facility] and presented here with knee swelling and found to be infected septic arthritis. Surgery done and removed hardware.She has had no swelling or warmth of her knee. She was initially started on vancomycin however developed a rash and this was changed to daptomycin. I then received a CPK level which was elevated over thousand earlier this week and I had her hold the remaining 3 days of daptomycin. He had her PICC line removed and repeat CPK today was under 1000. No issues with fever or chills.   Review of Systems  Constitutional: Negative for fever, chills and fatigue.  Gastrointestinal: Negative for nausea and diarrhea.  Skin: Negative for rash.  Neurological: Negative for dizziness and light-headedness.       Objective:   Physical Exam  Constitutional: She appears well-developed and well-nourished. No distress.  Eyes: No scleral icterus.  Cardiovascular: Normal rate, regular rhythm and normal heart sounds.   No murmur heard. Skin: No rash noted.          Assessment & Plan:

## 2015-01-20 LAB — URINE CULTURE: Colony Count: >=100000

## 2015-01-26 ENCOUNTER — Encounter (HOSPITAL_COMMUNITY)
Admission: RE | Admit: 2015-01-26 | Discharge: 2015-01-26 | Disposition: A | Discharge status: Home / Self Care | Payer: Medicare Other | Source: Skilled Nursing Facility | Attending: Internal Medicine | Admitting: Internal Medicine

## 2015-01-26 LAB — CK: Total CK: 62 U/L (ref 38–234)

## 2015-01-31 DIAGNOSIS — E161 Other hypoglycemia: Secondary | ICD-10-CM | POA: Diagnosis not present

## 2015-02-02 ENCOUNTER — Ambulatory Visit (HOSPITAL_COMMUNITY): Payer: Medicare Other | Attending: Internal Medicine | Admitting: Physical Therapy

## 2015-02-02 DIAGNOSIS — R2689 Other abnormalities of gait and mobility: Secondary | ICD-10-CM | POA: Insufficient documentation

## 2015-02-02 DIAGNOSIS — R262 Difficulty in walking, not elsewhere classified: Secondary | ICD-10-CM | POA: Insufficient documentation

## 2015-02-02 DIAGNOSIS — M25662 Stiffness of left knee, not elsewhere classified: Secondary | ICD-10-CM | POA: Insufficient documentation

## 2015-02-02 DIAGNOSIS — R29898 Other symptoms and signs involving the musculoskeletal system: Secondary | ICD-10-CM | POA: Insufficient documentation

## 2015-02-09 DIAGNOSIS — A4102 Sepsis due to Methicillin resistant Staphylococcus aureus: Secondary | ICD-10-CM | POA: Diagnosis not present

## 2015-02-09 DIAGNOSIS — K2901 Acute gastritis with bleeding: Secondary | ICD-10-CM | POA: Diagnosis not present

## 2015-02-09 DIAGNOSIS — E119 Type 2 diabetes mellitus without complications: Secondary | ICD-10-CM | POA: Diagnosis not present

## 2015-02-09 DIAGNOSIS — R000 Tachycardia, unspecified: Secondary | ICD-10-CM | POA: Diagnosis not present

## 2015-02-22 ENCOUNTER — Ambulatory Visit (HOSPITAL_COMMUNITY): Payer: Medicare Other | Admitting: Physical Therapy

## 2015-02-22 DIAGNOSIS — R2689 Other abnormalities of gait and mobility: Secondary | ICD-10-CM | POA: Diagnosis not present

## 2015-02-22 DIAGNOSIS — M25662 Stiffness of left knee, not elsewhere classified: Secondary | ICD-10-CM | POA: Diagnosis not present

## 2015-02-22 DIAGNOSIS — R262 Difficulty in walking, not elsewhere classified: Secondary | ICD-10-CM | POA: Diagnosis not present

## 2015-02-22 DIAGNOSIS — R29898 Other symptoms and signs involving the musculoskeletal system: Secondary | ICD-10-CM

## 2015-02-22 NOTE — Therapy (Signed)
[place], [place], [address] Phone: [phone]   Fax:  [phone]  Physical Therapy Evaluation  Patient Details  Name: Laura Davenport MRN: [id number] Date of Birth: [birth year] Referring Provider:  [name], MD  Encounter Date: 02/22/2015      PT End of Session - 02/22/15 1635    Visit Number 1   Number of Visits 24   Date for PT Re-Evaluation 04/23/15   PT Start Time [id number]   PT Stop Time 1518   PT Time Calculation (min) 33 min      Past Medical History  Diagnosis Date  . Type 2 diabetes mellitus   . Essential hypertension   . HLD (hyperlipidemia)   . Depression   . Septic arthritis     MRSA, left knee   . GI bleed     Severe gastritis and duodenal ulcers by EGD May 2016     Past Surgical History  Procedure Laterality Date  . Cholecystectomy N/A 11/04/2012    Procedure: LAPAROSCOPIC CHOLECYSTECTOMY;  Surgeon: Jamesetta So, MD;  Location: [facility];  Service: General;  Laterality: N/A;  . Knee surgery    . I&d extremity Left 12/24/2014    Procedure: IRRIGATION AND DEBRIDEMENT EXTREMITY AND ARTHROSCOPY KNEE;  Surgeon: Renette Butters, MD;  Location: [place];  Service: Orthopedics;  Laterality: Left;  . Esophagogastroduodenoscopy N/A 01/06/2015    Procedure: ESOPHAGOGASTRODUODENOSCOPY (EGD);  Surgeon: Danie Binder, MD;  [facility];  Service: Endoscopy;  Laterality: N/A;  1130  . Savory dilation  01/06/2015    Procedure: SAVORY DILATION;  Surgeon: Danie Binder, MD;  [facility];  Service: Endoscopy;;    There were no vitals filed for this visit.  Visit Diagnosis:  Knee stiffness, left  Weakness of left leg  Poor balance  Difficulty walking      Subjective Assessment - 02/22/15 1442    Subjective [name] states that she fell and fx her Lt knee requiring surgery on 11/18/2014.  She was discharged to a SNF.  Her knee became infected and she had an I and D on 12/24/2014  returning to the SNF.  She has recently been discharged from the SNF but is still very limited in her functioning therefore she has been referred to skilled therapy.    Pertinent History HTN, DM    Limitations Standing;Walking   How long can you sit comfortably? an hour   How long can you stand comfortably? with the walker  5-10 minutes   How long can you walk comfortably? I am trying I can't really do any walking    Currently in Pain? No/denies  worst at night is a 100/10             [facility] PT Assessment - 02/22/15 0001    Assessment   Medical Diagnosis Lt patella fx    Onset Date/Surgical Date --  11/18/2014   Next MD Visit --  unknown   Prior Therapy SNF   Precautions   Precautions Fall   Restrictions   Weight Bearing Restrictions No   Balance Screen   Has the patient fallen in the past 6 months Yes   How many times? 1   Has the patient had a decrease in activity level because of a fear of falling?  Yes   Is the patient reluctant to leave their home because of a fear of falling?  Yes   Home Environment  Living Environment Private residence   Living Arrangements --  lives with daughter    Type of [place] to enter  ramps in the back; stairs in front   [place] One level   Prior Function   Level of [place] Retired   Associate Professor   Overall Cognitive Status Within Functional Limits for tasks assessed   Observation/Other Assessments   Focus on Therapeutic Outcomes (FOTO)  26   ROM / Strength   AROM / PROM / Strength AROM;Strength   AROM   AROM Assessment Site Knee   Right/Left Knee Left   Left Knee Extension 30   Left Knee Flexion 110   Strength   Strength Assessment Site Hip;Knee;Ankle   Right/Left Hip Left   Left Hip Flexion 2+/5   Left Hip Extension 3-/5   Left Hip ABduction 3/5   Right/Left Knee Left   Left Knee Flexion 3+/5   Left Knee Extension 2-/5   Right/Left Ankle Left   Left Ankle Dorsiflexion  3+/5   Left Ankle Plantar Flexion 2-/5   Transfers   Transfers Sit to Stand   Sit to Stand 2: Max assist   Sit to Stand Details (indicate cue type and reason) --  PT leans backwards during transfer.   Ambulation/Gait   Ambulation/Gait No                   [facility] Adult PT Treatment/Exercise - 02/22/15 0001    Exercises   Exercises Knee/Hip   Knee/Hip Exercises: Supine   Quad Sets Left;10 reps   Heel Slides 10 reps   Bridges 10 reps   Straight Leg Raises --  with knee bent x 10                 PT Education - 02/22/15 1635    Education provided Yes   Education Details HEP   Person(s) Educated Patient   Methods Explanation;Verbal cues;Handout   Comprehension Verbalized understanding;Returned demonstration          PT Short Term Goals - 02/22/15 1648    PT SHORT TERM GOAL #1   Title I HEP   Time 7   Period Days   PT SHORT TERM GOAL #2   Title PT to be able to transfer with moderate assist    Time 2   Period Weeks   PT SHORT TERM GOAL #3   Title Pt to be able to straighten knee to -15 to allow a more normalized gt.   Time 4   Period Weeks   PT SHORT TERM GOAL #4   Title Pt to be able to walk with RW and min assist x 50 ft.   Time 4   Period Weeks   PT SHORT TERM GOAL #5   Title Pain to be no greater than a 6/10   Time 4   Period Weeks           PT Long Term Goals - 02/22/15 1649    PT LONG TERM GOAL #1   Title Pt to be able to transfer with supervision   Time 8   Period Weeks   PT LONG TERM GOAL #2   Title Pt to be able to walk with walder and mod I x 10 minutes    Time 8   Period Weeks   PT LONG TERM GOAL #3   Title Pt extension to 5 degrees to allow a more normalized gait.  Time 8   Period Weeks   PT LONG TERM GOAL #4   Title Pt to be able to stand for 20 minutes without the use of the walker to be able to make a small meal    Time 8   Period Weeks   PT LONG TERM GOAL #5   Title Pain to be no greater than a 2/10   Time 8    Period Weeks               Plan - 03-14-2015 1636    Clinical Impression Statement [name] is an 81 yo female who fel fx her Lt patella in March, in April she returned to surgery for an I and D.  She has recently been discharged from the [facility] and referred to PT to maximize her functional ability.  Examination demonstrates that the pt needs max assist for transfers.  Pt is unable to stand without an assistive devices, stiffness in her knee, weakness of the Lt LE.  Ms. Rzepecki will benefit from skilled physcial theapy to maximize her functional ability and improve her quality of life.    Pt will benefit from skilled therapeutic intervention in order to improve on the following deficits Decreased activity tolerance;Decreased balance;Decreased mobility;Decreased strength;Decreased range of motion;Difficulty walking;Pain   Rehab Potential Good   PT Frequency 3x / week   PT Duration 8 weeks   PT Treatment/Interventions Therapeutic exercise;Therapeutic activities;Functional mobility training;Gait training;Patient/family education   PT Next Visit Plan begin heel raise,  functional squat at //.  Standing knee flextion and terminal extension if able.  Standing without UE support and focus on increasing knee extension.    PT Home Exercise Plan given   Consulted and Agree with Plan of Care Patient          G-Codes - 03-14-15 1646    Functional Assessment Tool Used foto   Functional Limitation Mobility: Walking and moving around   Mobility: Walking and Moving Around Current Status [phone]) At least 60 percent but less than 80 percent impaired, limited or restricted   Mobility: Walking and Moving Around Goal Status [phone]) At least 40 percent but less than 60 percent impaired, limited or restricted       Problem List Patient Active Problem List   Diagnosis Date Noted  . AP (abdominal pain)   . Hematemesis without nausea   . Atrial fibrillation and flutter   . AKI (acute kidney  injury)   . Bacteremia   . Essential hypertension   . Septic joint of left knee joint   . Blood poisoning   . Fever 12/22/2014  . History of fractured kneecap 12/22/2014  . Knee fracture, left 12/22/2014  . Sepsis 12/22/2014  . Tachycardia with 141 - 160 beats per minute 12/22/2014  . Diabetes mellitus without complication   . Hypertension   . HLD (hyperlipidemia)   . Depression    Tenny Rauber, [place] CLT [phone] 2015-03-14, 4:50 PM  [place] [address] [place], [place], [address] Phone: [phone]   Fax:  [phone]

## 2015-02-22 NOTE — Patient Instructions (Addendum)
Heel Raise   Rise up on toes of both feet. Hold _3__ seconds. Repeat 10___ times. Do _3__ sessions per day. Apply manual resistance to thighs. Note: If possible, place feet on floor. If unable to do with both legs, do one leg at a time.  Copyright  VHI. All rights reserved.  Toe Raise   Rise up on heels. Hold _3__ seconds. Repeat _10__ times. Do _1__ sessions per day. Place ankle weight on top of each foot. Do with ______ colored band around toes, facing anchor. Note: If possible, place feet on floor. If unable to do with both legs, do one leg at a time.  Copyright  VHI. All rights reserved.  Extension / Flexion   Extend leg as high as is comfortable. Hold __3_ seconds. Bend knee, returning foot to support. Hold __3_ seconds. Repeat _10__ times each leg, Do __3_ sessions per day. Do with ___ lb weight on ankle.  Copyright  VHI. All rights reserved.  Strengthening: Quadriceps Set   Tighten muscles on top of thighs by pushing knees down into surface. Hold _5___ seconds. Repeat _10___ times per set. Do _1___ sets per session. Do __3__ sessions per day.  http://orth.exer.us/602   Copyright  VHI. All rights reserved.  Self-Mobilization: Heel Slide (Supine)   Slide left heel toward buttocks until a gentle stretch is felt. Hold __5__ seconds. Relax. Repeat _10___ times per set. Do _1___ sets per session. Do __3__ sessions per day.  http://orth.exer.us/710   Copyright  VHI. All rights reserved.  Strengthening: Terminal Knee Extension (Supine)   With left  knee over bolster, straighten knee by tightening muscles on top of thigh. Keep bottom of knee on bolster. Repeat _10___ times per set. Do 1____ sets per session. Do3 ____ sessions per day.  http://orth.exer.us/626   Copyright  VHI. All rights reserved.  Knee Extension Mobilization: Towel Prop   With rolled towel under right ankle, place __2__ pound weight across knee. Hold _5-30___ minutes. Repeat __1__ times per  set. Do ___1_ sets per session. Do __2__ sessions per day.  http://orth.exer.us/720   Copyright  VHI. All rights reserved.

## 2015-02-27 DIAGNOSIS — S82002A Unspecified fracture of left patella, initial encounter for closed fracture: Secondary | ICD-10-CM | POA: Diagnosis not present

## 2015-02-27 DIAGNOSIS — S42301A Unspecified fracture of shaft of humerus, right arm, initial encounter for closed fracture: Secondary | ICD-10-CM | POA: Diagnosis not present

## 2015-02-28 ENCOUNTER — Ambulatory Visit (HOSPITAL_COMMUNITY): Payer: Medicare Other | Admitting: Physical Therapy

## 2015-02-28 DIAGNOSIS — R2689 Other abnormalities of gait and mobility: Secondary | ICD-10-CM

## 2015-02-28 DIAGNOSIS — R262 Difficulty in walking, not elsewhere classified: Secondary | ICD-10-CM

## 2015-02-28 DIAGNOSIS — R29898 Other symptoms and signs involving the musculoskeletal system: Secondary | ICD-10-CM

## 2015-02-28 DIAGNOSIS — M25662 Stiffness of left knee, not elsewhere classified: Secondary | ICD-10-CM | POA: Diagnosis not present

## 2015-02-28 NOTE — Therapy (Signed)
[place], [place], [address] Phone: [phone]   Fax:  [phone]  Physical Therapy Treatment  Patient Details  Name: Laura Davenport MRN: [id number] Date of Birth: May 09, 1931 Referring Provider:  [name], MD  Encounter Date: 02/28/2015      PT End of Session - 02/28/15 1103    Visit Number 2   Number of Visits 24   Date for PT Re-Evaluation 04/23/15   PT Start Time [id number]   PT Stop Time 1057   PT Time Calculation (min) 42 min   Activity Tolerance Patient limited by pain   Behavior During Therapy Anxious      Past Medical History  Diagnosis Date  . Type 2 diabetes mellitus   . Essential hypertension   . HLD (hyperlipidemia)   . Depression   . Septic arthritis     MRSA, left knee   . GI bleed     Severe gastritis and duodenal ulcers by EGD May 2016     Past Surgical History  Procedure Laterality Date  . Cholecystectomy N/A 11/04/2012    Procedure: LAPAROSCOPIC CHOLECYSTECTOMY;  Surgeon: Jamesetta So, MD;  [facility];  Service: General;  Laterality: N/A;  . Knee surgery    . I&d extremity Left 12/24/2014    Procedure: IRRIGATION AND DEBRIDEMENT EXTREMITY AND ARTHROSCOPY KNEE;  Surgeon: Renette Butters, MD;  Location: [facility];  Service: Orthopedics;  Laterality: Left;  . Esophagogastroduodenoscopy N/A 01/06/2015    Procedure: ESOPHAGOGASTRODUODENOSCOPY (EGD);  Surgeon: Danie Binder, MD;  [facility];  Service: Endoscopy;  Laterality: N/A;  1130  . Savory dilation  01/06/2015    Procedure: SAVORY DILATION;  Surgeon: Danie Binder, MD;  [facility];  Service: Endoscopy;;    There were no vitals filed for this visit.  Visit Diagnosis:  Knee stiffness, left  Weakness of left leg  Poor balance  Difficulty walking      Subjective Assessment - 02/28/15 1020    Subjective Patient states that she is continuing to have a large amount of pain in her knee, is trying to  stay on top of her pain medications. States that she had a good weekend with grandkids.    Pertinent History HTN, DM    Currently in Pain? Yes   Pain Score 8    Pain Location Knee   Pain Orientation Left                         [facility] Adult PT Treatment/Exercise - 02/28/15 0001    Knee/Hip Exercises: Standing   Heel Raises Limitations attempted, pt self-terminated exercise due to pain    Other Standing Knee Exercises attempted sit to stand with wakler however patient put forth very little effort for transfer, total assist; able to complete sit to stand at parallel bar by pulling on bar, Max(A)   Other Standing Knee Exercises weight shifts with manual faciltation for form x2 minutes; unsupported standing in // bars 3x5 seconds with and without TKEs    Knee/Hip Exercises: Seated   Other Seated Knee/Hip Exercises Forward reaching task with cones to promote improved transfer mechanics 2 rounds                 PT Education - 02/28/15 1103    Education provided Yes   Education Details education regarding transfer mechanics, safety, importance of weight bearing fully through L LE  Person(s) Educated Patient   Methods Explanation   Comprehension Verbalized understanding          PT Short Term Goals - 02/22/15 1648    PT SHORT TERM GOAL #1   Title I HEP   Time 7   Period Days   PT SHORT TERM GOAL #2   Title PT to be able to transfer with moderate assist    Time 2   Period Weeks   PT SHORT TERM GOAL #3   Title Pt to be able to straighten knee to -15 to allow a more normalized gt.   Time 4   Period Weeks   PT SHORT TERM GOAL #4   Title Pt to be able to walk with RW and min assist x 50 ft.   Time 4   Period Weeks   PT SHORT TERM GOAL #5   Title Pain to be no greater than a 6/10   Time 4   Period Weeks           PT Long Term Goals - 02/22/15 1649    PT LONG TERM GOAL #1   Title Pt to be able to transfer with supervision   Time 8   Period Weeks    PT LONG TERM GOAL #2   Title Pt to be able to walk with walder and mod I x 10 minutes    Time 8   Period Weeks   PT LONG TERM GOAL #3   Title Pt extension to 5 degrees to allow a more normalized gait.   Time 8   Period Weeks   PT LONG TERM GOAL #4   Title Pt to be able to stand for 20 minutes without the use of the walker to be able to make a small meal    Time 8   Period Weeks   PT LONG TERM GOAL #5   Title Pain to be no greater than a 2/10   Time 8   Period Weeks               Plan - 02/28/15 1104    Clinical Impression Statement Patient presents with significant pain L LE and appears very anxious regarding mobility in general. Unable to stand without pulling on device or parallel bars today, requited Mod-Max(A) during transfers today. Focus on improving transfer mechanics and standing activity with focus on equal weight bearing, full weight shifts, and attempted LE exercise in standing that was limited by pain. Patient also required frequent rest breaks during session.    Pt will benefit from skilled therapeutic intervention in order to improve on the following deficits Decreased activity tolerance;Decreased balance;Decreased mobility;Decreased strength;Decreased range of motion;Difficulty walking;Pain   Rehab Potential Good   PT Frequency 3x / week   PT Duration 8 weeks   PT Treatment/Interventions Therapeutic exercise;Therapeutic activities;Functional mobility training;Gait training;Patient/family education   PT Next Visit Plan continue to attempt standing exercise, work towards gait, unsupported standing, increase knee extension    PT Home Exercise Plan given   Consulted and Agree with Plan of Care Patient        Problem List Patient Active Problem List   Diagnosis Date Noted  . AP (abdominal pain)   . Hematemesis without nausea   . Atrial fibrillation and flutter   . AKI (acute kidney injury)   . Bacteremia   . Essential hypertension   . Septic joint of  left knee joint   . Blood poisoning   . Fever 12/22/2014  .  History of fractured kneecap 12/22/2014  . Knee fracture, left 12/22/2014  . Sepsis 12/22/2014  . Tachycardia with 141 - 160 beats per minute 12/22/2014  . Diabetes mellitus without complication   . Hypertension   . HLD (hyperlipidemia)   . Depression     Deniece Ree PT, DPT [id number]  [facility] [address] [place], [place], [address] Phone: [phone]   Fax:  [phone]

## 2015-03-07 ENCOUNTER — Ambulatory Visit (HOSPITAL_COMMUNITY): Payer: Medicare Other | Attending: Internal Medicine

## 2015-03-07 DIAGNOSIS — R2689 Other abnormalities of gait and mobility: Secondary | ICD-10-CM | POA: Insufficient documentation

## 2015-03-07 DIAGNOSIS — R29898 Other symptoms and signs involving the musculoskeletal system: Secondary | ICD-10-CM | POA: Diagnosis present

## 2015-03-07 DIAGNOSIS — M25662 Stiffness of left knee, not elsewhere classified: Secondary | ICD-10-CM | POA: Diagnosis present

## 2015-03-07 DIAGNOSIS — R262 Difficulty in walking, not elsewhere classified: Secondary | ICD-10-CM | POA: Insufficient documentation

## 2015-03-07 NOTE — Therapy (Signed)
[facility] [address] [place], [place], [address] Phone: [phone]   Fax:  [phone]  Physical Therapy Treatment  Patient Details  Name: Laura Davenport MRN: [id number] Date of Birth: 12/28/1930 Referring Provider:  [name], MD  Encounter Date: 03/07/2015      PT End of Session - 03/07/15 1600    Visit Number 3   Number of Visits 24   Date for PT Re-Evaluation 04/23/15   PT Start Time 1520   PT Stop Time 1600   PT Time Calculation (min) 40 min   Activity Tolerance Patient limited by pain   Behavior During Therapy Anxious      Past Medical History  Diagnosis Date  . Type 2 diabetes mellitus   . Essential hypertension   . HLD (hyperlipidemia)   . Depression   . Septic arthritis     MRSA, left knee   . GI bleed     Severe gastritis and duodenal ulcers by EGD May 2016     Past Surgical History  Procedure Laterality Date  . Cholecystectomy N/A 11/04/2012    Procedure: LAPAROSCOPIC CHOLECYSTECTOMY;  Surgeon: Jamesetta So, MD;  [facility]: [facility];  Service: General;  Laterality: N/A;  . Knee surgery    . I&d extremity Left 12/24/2014    Procedure: IRRIGATION AND DEBRIDEMENT EXTREMITY AND ARTHROSCOPY KNEE;  Surgeon: Renette Butters, MD;  Location: [facility];  Service: Orthopedics;  Laterality: Left;  . Esophagogastroduodenoscopy N/A 01/06/2015    Procedure: ESOPHAGOGASTRODUODENOSCOPY (EGD);  Surgeon: Danie Binder, MD;  [facility];  Service: Endoscopy;  Laterality: N/A;  1130  . Savory dilation  01/06/2015    Procedure: SAVORY DILATION;  Surgeon: Danie Binder, MD;  [facility];  Service: Endoscopy;;    There were no vitals filed for this visit.  Visit Diagnosis:  Knee stiffness, left  Weakness of left leg  Poor balance  Difficulty walking      Subjective Assessment - 03/07/15 1522    Subjective Pt stated current pain scale 4/10, pain increases with standing   Currently in Pain? Yes   Pain  Score 4    Pain Location Knee   Pain Orientation Left             [facility] Adult PT Treatment/Exercise - 03/07/15 0001    Transfers   Transfers Sit to Stand   Sit to Stand 2: Max assist   Sit to Stand Details (indicate cue type and reason) Pt leans backs during transfers,and sit to stand; cueing for hand and foot placement   Exercises   Exercises Knee/Hip   Knee/Hip Exercises: Stretches   Active Hamstring Stretch Left;3 reps;30 seconds   Active Hamstring Stretch Limitations long sitting with manual stretches   Knee: Self-Stretch to increase Flexion Left   Knee: Self-Stretch Limitations knee drives on 6in step to improve weight bearing Lt foot; 4in step next session   Gastroc Stretch Left;3 reps;30 seconds   Gastroc Stretch Limitations long sit with rope on ankle   Knee/Hip Exercises: Standing   Heel Raises 5 reps   Heel Raises Limitations max therapist facilitatin   Other Standing Knee Exercises Sit to stand with HHA and cueing for techniuqe 7times through session   Other Standing Knee Exercises weight shifting with therapist facilitation   Knee/Hip Exercises: Seated   Other Seated Knee/Hip Exercises ankle pumps on floor 2x10             PT  Education - 03/07/15 1559    Education Details Educated on benefits of ice for pain and edema control; importances of weight bearing Lt LE; safety mechanics with transfers/ sit to stand   Person(s) Educated Patient   Methods Explanation   Comprehension Verbalized understanding          PT Short Term Goals - 03/07/15 1619    PT SHORT TERM GOAL #1   Title I HEP   Status On-going   PT SHORT TERM GOAL #2   Title PT to be able to transfer with moderate assist    PT SHORT TERM GOAL #3   Title Pt to be able to straighten knee to -15 to allow a more normalized gt.   Status On-going   PT SHORT TERM GOAL #4   Title Pt to be able to walk with RW and min assist x 50 ft.   PT SHORT TERM GOAL #5   Title Pain to be no greater than a 6/10            PT Long Term Goals - 03/07/15 1620    PT LONG TERM GOAL #1   Title Pt to be able to transfer with supervision   PT LONG TERM GOAL #2   Title Pt to be able to walk with walder and mod I x 10 minutes    PT LONG TERM GOAL #3   Title Pt extension to 5 degrees to allow a more normalized gait.   PT LONG TERM GOAL #4   Title Pt to be able to stand for 20 minutes without the use of the walker to be able to make a small meal    PT LONG TERM GOAL #5   Title Pain to be no greater than a 2/10               Plan - 03/07/15 1606    Clinical Impression Statement Session focus on education for safety wtih transfers and importance of weight bearing Lt LE.  Pt very resistant to any weight bearing Lt LE per pain and fear of falling.  Pt with tendency to try to pull up to parallel bars rather than push from chair, pt educated on importance of proper UE/LE placement to reduce risk of falls and increased ease while standing.  Pt required several rest breaks  due to fatigue and pain.  Pt did tolerate well Lt LE on step forward lunges to increase weight bearing, unable to bear weight for Rt LE to step forward.   Manual passive stretches complete during rest breaks to Lt gastroc, hamstrings and PROM for extension per pt.'s tolerance to improve flexibilty and increase ROM.  PROM 14 degrees from extension.  Pt educated on benefits of ice on knee for pain and edema control.     PT Next Visit Plan continue to attempt standing exercise, work towards gait, unsupported standing, increase knee extension         Problem List Patient Active Problem List   Diagnosis Date Noted  . AP (abdominal pain)   . Hematemesis without nausea   . Atrial fibrillation and flutter   . AKI (acute kidney injury)   . Bacteremia   . Essential hypertension   . Septic joint of left knee joint   . Blood poisoning   . Fever 12/22/2014  . History of fractured kneecap 12/22/2014  . Knee fracture, left 12/22/2014  .  Sepsis 12/22/2014  . Tachycardia with 141 - 160 beats per minute 12/22/2014  .  Diabetes mellitus without complication   . Hypertension   . HLD (hyperlipidemia)   . Depression    Aldona Lento, PTA  Aldona Lento 03/07/2015, 4:23 PM  [facility] [address] [place], [place], [address] Phone: [phone]   Fax:  [phone]

## 2015-03-09 ENCOUNTER — Ambulatory Visit (HOSPITAL_COMMUNITY): Payer: Medicare Other | Admitting: Physical Therapy

## 2015-03-09 DIAGNOSIS — R29898 Other symptoms and signs involving the musculoskeletal system: Secondary | ICD-10-CM | POA: Diagnosis not present

## 2015-03-09 DIAGNOSIS — K2901 Acute gastritis with bleeding: Secondary | ICD-10-CM | POA: Diagnosis not present

## 2015-03-09 DIAGNOSIS — R2689 Other abnormalities of gait and mobility: Secondary | ICD-10-CM | POA: Diagnosis not present

## 2015-03-09 DIAGNOSIS — M25662 Stiffness of left knee, not elsewhere classified: Secondary | ICD-10-CM

## 2015-03-09 DIAGNOSIS — R262 Difficulty in walking, not elsewhere classified: Secondary | ICD-10-CM

## 2015-03-09 DIAGNOSIS — E119 Type 2 diabetes mellitus without complications: Secondary | ICD-10-CM | POA: Diagnosis not present

## 2015-03-09 DIAGNOSIS — M0020 Other streptococcal arthritis, unspecified joint: Secondary | ICD-10-CM | POA: Diagnosis not present

## 2015-03-09 NOTE — Therapy (Addendum)
[facility] [place], [place], [address] Phone: [phone]   Fax:  [phone]  Physical Therapy Treatment  Patient Details  Name: Laura Davenport MRN: [id number] Date of Birth: [birth year] Referring Provider:  [name], MD  Encounter Date: 03/09/2015      PT End of Session - 03/09/15 1431    Visit Number 4   Number of Visits 24   Date for PT Re-Evaluation 04/23/15   Authorization - Visit Number 4   Authorization - Number of Visits 10   PT Start Time 1020   PT Stop Time 1104   PT Time Calculation (min) 44 min   Activity Tolerance Patient limited by pain   Behavior During Therapy Anxious      Past Medical History  Diagnosis Date  . Type 2 diabetes mellitus   . Essential hypertension   . HLD (hyperlipidemia)   . Depression   . Septic arthritis     MRSA, left knee   . GI bleed     Severe gastritis and duodenal ulcers by EGD May 2016     Past Surgical History  Procedure Laterality Date  . Cholecystectomy N/A 11/04/2012    Procedure: LAPAROSCOPIC CHOLECYSTECTOMY;  Surgeon: Jamesetta So, MD;  [facility];  Service: General;  Laterality: N/A;  . Knee surgery    . I&d extremity Left 12/24/2014    Procedure: IRRIGATION AND DEBRIDEMENT EXTREMITY AND ARTHROSCOPY KNEE;  Surgeon: Renette Butters, MD;  Location: [facility];  Service: Orthopedics;  Laterality: Left;  . Esophagogastroduodenoscopy N/A 01/06/2015    Procedure: ESOPHAGOGASTRODUODENOSCOPY (EGD);  Surgeon: Danie Binder, MD;  [facility];  Service: Endoscopy;  Laterality: N/A;  1130  . Savory dilation  01/06/2015    Procedure: SAVORY DILATION;  Surgeon: Danie Binder, MD;  [facility];  Service: Endoscopy;;    There were no vitals filed for this visit.  Visit Diagnosis:  Knee stiffness, left  Weakness of left leg  Poor balance  Difficulty walking      Subjective Assessment - 03/09/15 1428    Subjective Patient reports continued  pain in her Lt knee with inablity to stand or move very well.  Currently 4/10   Currently in Pain? Yes   Pain Score 4    Pain Location Knee   Pain Orientation Left                         [facility] Adult PT Treatment/Exercise - 03/09/15 0001    Transfers   Transfers Sit to Stand   Sit to Stand 2: Max assist   Sit to Stand Details (indicate cue type and reason) extension posturing   Knee/Hip Exercises: Standing   Forward Lunges Left;10 reps   Forward Lunges Limitations 4" step to encourage weight bearing    Other Standing Knee Exercises weight shifting with therapist facilitation   Knee/Hip Exercises: Seated   Long Arc Quad Left;10 reps;AAROM   Other Seated Knee/Hip Exercises ankle pumps on floor 2x10   Knee/Hip Exercises: Supine   Quad Sets Left;10 reps   Short Arc [employer];Left;10 reps   Heel Slides 10 reps   Manual Therapy   Manual Therapy Soft tissue mobilization;Myofascial release   Soft tissue mobilization Lt knee to improve ROM   Myofascial Release to decrease adhesions                  PT Short  Term Goals - 03/07/15 1619    PT SHORT TERM GOAL #1   Title I HEP   Status On-going   PT SHORT TERM GOAL #2   Title PT to be able to transfer with moderate assist    PT SHORT TERM GOAL #3   Title Pt to be able to straighten knee to -15 to allow a more normalized gt.   Status On-going   PT SHORT TERM GOAL #4   Title Pt to be able to walk with RW and min assist x 50 ft.   PT SHORT TERM GOAL #5   Title Pain to be no greater than a 6/10           PT Long Term Goals - 03/07/15 1620    PT LONG TERM GOAL #1   Title Pt to be able to transfer with supervision   PT LONG TERM GOAL #2   Title Pt to be able to walk with walder and mod I x 10 minutes    PT LONG TERM GOAL #3   Title Pt extension to 5 degrees to allow a more normalized gait.   PT LONG TERM GOAL #4   Title Pt to be able to stand for 20 minutes without the use of the walker to be able  to make a small meal    PT LONG TERM GOAL #5   Title Pain to be no greater than a 2/10               Plan - 03/09/15 1432    Clinical Impression Statement Continued focus on transfers, weight bearing through Lt LE, improving ROM Lt knee and quad strength.  PT continues to be extremely fearful of falling and apprehensive to weight bear through [place].  PT wtih extension contracture at this point and will need more focus on ROM initially along with transfers before progressing and may do well with weight assisted tract system.  Manual completed to Lt knee focusing on decreasing adhesions with myofascial techniuqes, retro to decrease edema and pain and improve ROM.  PT tearful at times during therapy due to c/o pain and fear with transferring.  Pt reported knee with overall reduction in pain at end of session.      PT Next Visit Plan continue with focus on increasing LT knee ROM and strength progressing to standing and gait.         Problem List Patient Active Problem List   Diagnosis Date Noted  . AP (abdominal pain)   . Hematemesis without nausea   . Atrial fibrillation and flutter   . AKI (acute kidney injury)   . Bacteremia   . Essential hypertension   . Septic joint of left knee joint   . Blood poisoning   . Fever 12/22/2014  . History of fractured kneecap 12/22/2014  . Knee fracture, left 12/22/2014  . Sepsis 12/22/2014  . Tachycardia with 141 - 160 beats per minute 12/22/2014  . Diabetes mellitus without complication   . Hypertension   . HLD (hyperlipidemia)   . Depression     Teena Irani, PTA/CLT [phone]  03/09/2015, 3:17 PM  [facility] [address] [place], [place], [address] Phone: [phone]   Fax:  [phone]

## 2015-03-13 ENCOUNTER — Ambulatory Visit (INDEPENDENT_AMBULATORY_CARE_PROVIDER_SITE_OTHER): Payer: Medicare Other | Admitting: Nurse Practitioner

## 2015-03-13 ENCOUNTER — Encounter: Payer: Self-pay | Admitting: Nurse Practitioner

## 2015-03-13 ENCOUNTER — Other Ambulatory Visit: Payer: Self-pay

## 2015-03-13 VITALS — BP 134/78 | HR 76 | Temp 97.9°F | Ht 65.0 in

## 2015-03-13 DIAGNOSIS — R1310 Dysphagia, unspecified: Secondary | ICD-10-CM

## 2015-03-13 DIAGNOSIS — R1314 Dysphagia, pharyngoesophageal phase: Secondary | ICD-10-CM

## 2015-03-13 NOTE — Assessment & Plan Note (Addendum)
81 year old female with a history of dysphagia. Her last endoscopy was completed in May of this year with dilation. The EGD also found duodenal ulcers, gastritis, and esophagitis. It is possible some of her symptoms are because of her erosions and she is on PPI. She and her daughter state she had minimal improvement after the dilation since starting PPI. Continues to have dysphagia symptoms have begun exploring foods which foods cause dysphagia and eliminate them. Tends to be worse with drier larger meats. 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 At this point her symptoms could have a component of age-related dysmotility or weakened esophagus. We will order a barium pill esophagram to further evaluate her esophageal motility and effectiveness. We'll also give her diet instructions for dysphagia diet to include soft foods and chopped meats. However return in 3 months for further evaluation after prolonged PPI and diet changes.

## 2015-03-13 NOTE — Patient Instructions (Addendum)
1. I'm putting in an order for that special x-ray to [place] evaluate how you're swallowing is. 2. Further recommendations to be based on results of that test. 3. Have your labs drawn when you're able. 4. I'm providing him instructions on good foods to eat for people grab difficulty swallowing. 5. Return for follow-up in 3 months.    Dysphagia Swallowing problems (dysphagia) occur when solids and liquids seem to stick in your throat on the way down to your stomach, or the food takes longer to get to the stomach. Other symptoms include regurgitating food, noises coming from the throat, chest discomfort with swallowing, and a feeling of fullness or the feeling of something being stuck in your throat when swallowing. When blockage in your throat is complete, it may be associated with drooling. CAUSES  Problems with swallowing may occur because of problems with the muscles. The food cannot be propelled in the usual manner into your stomach. You may have ulcers, scar tissue, or inflammation in the tube down which food travels from your mouth to your stomach (esophagus), which blocks food from passing normally into the stomach. Causes of inflammation include:  Acid reflux from your stomach into your esophagus.  Infection.  Radiation treatment for cancer.  Medicines taken without enough fluids to wash them down into your stomach. You may have nerve problems that prevent signals from being sent to the muscles of your esophagus to contract and move your food down to your stomach. Globus pharyngeus is a relatively common problem in which there is a sense of an obstruction or difficulty in swallowing, without any physical abnormalities of the swallowing passages being found. This problem usually improves over time with reassurance and testing to rule out other causes. DIAGNOSIS Dysphagia can be diagnosed and its cause can be determined by tests in which you swallow a white substance that helps illuminate the  inside of your throat (contrast medium) while X-rays are taken. Sometimes a flexible telescope that is inserted down your throat (endoscopy) to look at your esophagus and stomach is used. TREATMENT   If the dysphagia is caused by acid reflux or infection, medicines may be used.  If the dysphagia is caused by problems with your swallowing muscles, swallowing therapy may be used to help you strengthen your swallowing muscles.  If the dysphagia is caused by a blockage or mass, procedures to remove the blockage may be done. HOME CARE INSTRUCTIONS  Try to eat soft food that is easier to swallow and check your weight on a daily basis to be sure that it is not decreasing.  Be sure to drink liquids when sitting upright (not lying down). SEEK MEDICAL CARE IF:  You are losing weight because you are unable to swallow.  You are coughing when you drink liquids (aspiration).  You are coughing up partially digested food. SEEK IMMEDIATE MEDICAL CARE IF:  You are unable to swallow your own saliva .  You are having shortness of breath or a fever, or both.  You have a hoarse voice along with difficulty swallowing. MAKE SURE YOU:  Understand these instructions.  Will watch your condition.  Will get help right away if you are not doing well or get worse. Document Released: 08/16/2000 Document Revised: 01/03/2014 Document Reviewed: 02/05/2013 [facility] Patient Information 2015 [place], [place]. This information is not intended to replace advice given to you by your health care provider. Make sure you discuss any questions you have with your health care provider.

## 2015-03-13 NOTE — Progress Notes (Addendum)
Referring Provider: [name], MD Primary Care Physician:  [name], MD Primary GI:  Dr. Oneida Alar  Chief Complaint  Patient presents with  . Dysphagia    HPI:   81 year old female referred by PCP for dysphagia. Endoscopy completed 01/06/2015 for abdominal pain and hematemesis found mild esophagitis and severe erosive gastritis, moderate duodenitis, and multiple duodenal ulcers. Esophageal dilation was also performed successfully. Recommended card modified diet, continue Protonix twice daily for 3 months and once daily forever, hold aspirin until 5/21, on Xarelto until 5/14. Follow-up in 4 months.  Today she states she's still having dysphagia symptoms. Not sure if it's food specific. Does have regurgitation. States symptoms are no better or worse compared to before the EGD. For the last week it's been a little better. Denies abdominal pain and hematemesis. Denies hematochezia and melena. Denies nausea. Eats regular foods (no chopped meats or soft foods diet). Per patient's daughter her symptoms were more pronounced in the last month. They've altered her foods to find foods that do better with the dysphagia symptoms. Denies chest pain, dyspnea, dizziness, lightheadedness, syncope, near syncope. Denies any other upper or lower GI symptoms.   Past Medical History  Diagnosis Date  . Type 2 diabetes mellitus   . Essential hypertension   . HLD (hyperlipidemia)   . Depression   . Septic arthritis     MRSA, left knee   . GI bleed     Severe gastritis and duodenal ulcers by EGD May 2016     Past Surgical History  Procedure Laterality Date  . Cholecystectomy N/A 11/04/2012    Procedure: LAPAROSCOPIC CHOLECYSTECTOMY;  Surgeon: Jamesetta So, MD;  [facility]: [facility];  Service: General;  Laterality: N/A;  . Knee surgery    . I&d extremity Left 12/24/2014    Procedure: IRRIGATION AND DEBRIDEMENT EXTREMITY AND ARTHROSCOPY KNEE;  Surgeon: Renette Butters, MD;  Location: [facility];  Service:  Orthopedics;  Laterality: Left;  . Esophagogastroduodenoscopy N/A 01/06/2015    Procedure: ESOPHAGOGASTRODUODENOSCOPY (EGD);  Surgeon: Danie Binder, MD;  [facility];  Service: Endoscopy;  Laterality: N/A;  1130  . Savory dilation  01/06/2015    Procedure: SAVORY DILATION;  Surgeon: Danie Binder, MD;  [facility];  Service: Endoscopy;;    Current Outpatient Prescriptions  Medication Sig Dispense Refill  . acetaminophen (TYLENOL) 325 MG tablet Take 650 mg by mouth every 6 (six) hours as needed for mild pain or moderate pain.     . Amino Acids-Protein Hydrolys (FEEDING SUPPLEMENT, PRO-STAT SUGAR FREE 64,) LIQD Take 30 mLs by mouth at bedtime.    Marland Kitchen atorvastatin (LIPITOR) 80 MG tablet Take 80 mg by mouth daily at 6 PM.    . diltiazem (CARDIZEM CD) 120 MG 24 hr capsule Take 1 capsule (120 mg total) by mouth daily. 30 capsule 0  . feeding supplement, GLUCERNA SHAKE, (GLUCERNA SHAKE) LIQD Take 237 mLs by mouth daily before breakfast.    . hydrocortisone (ANUSOL-HC) 25 MG suppository Place 1 suppository (25 mg total) rectally 2 (two) times daily. 12 suppository 0  . oxyCODONE-acetaminophen (PERCOCET/ROXICET) 5-325 MG per tablet Take 1 tablet by mouth every 4 (four) hours as needed for severe pain. 30 tablet 0  . pantoprazole (PROTONIX) 40 MG tablet Take 40 mg by mouth daily.    . insulin aspart (NOVOLOG) 100 UNIT/ML injection Inject 0-9 Units into the skin 3 (three) times daily with meals. (Patient not taking: Reported on 03/13/2015) 10 mL 11  .  insulin glargine (LANTUS) 100 UNIT/ML injection Inject 0.2 mLs (20 Units total) into the skin at bedtime. (Patient not taking: Reported on 03/13/2015) 10 mL 11   No current facility-administered medications for this visit.    Allergies as of 03/13/2015 - Review Complete 03/13/2015  Allergen Reaction Noted  . Oxycodone Hives 12/30/2014    Family History  Problem Relation Age of Onset  . Family history unknown: Yes    History    Social History  . Marital Status: Widowed    Spouse Name: N/A  . Number of Children: 53  . Years of Education: N/A   Occupational History  . retired; [employer] History Main Topics  . Smoking status: Never Smoker   . Smokeless tobacco: Not on file  . Alcohol Use: No  . Drug Use: No  . Sexual Activity: Not on file   Other Topics Concern  . None   Social History Narrative   Lives alone   3 daughters & granddaughter nearby   Lost 1 son-strep          Review of Systems: General: Negative for anorexia, weight loss, fever, chills. Eyes: Negative for vision changes.  CV: Negative for chest pain, angina, palpitations, peripheral edema.  Respiratory: Negative for dyspnea at rest, cough, sputum, wheezing.  GI: See history of present illness. Derm: Negative for rash or itching.  Endo: Negative for unusual weight change.    Physical Exam: BP 134/78 mmHg  Pulse 76  Temp(Src) 97.9 F (36.6 C) (Oral)  Ht 5\' 5"  (1.651 m) General:   Alert and oriented. Pleasant and cooperative. Well-nourished and well-developed.  Head:  Normocephalic and atraumatic. Eyes:  Without icterus, sclera clear and conjunctiva pink.  Ears:  Normal auditory acuity. Cardiovascular:  S1, S2 present without murmurs appreciated. Extremities without clubbing or edema. Respiratory:  Clear to auscultation bilaterally. No wheezes, rales, or rhonchi. No distress.  Gastrointestinal:  +BS, soft, non-tender and non-distended. No HSM noted. No guarding or rebound. No masses appreciated.  Rectal:  Deferred  Neurologic:  Alert and oriented x4;  grossly normal neurologically. Psych:  Alert and cooperative. Normal mood and affect. Heme/Lymph/Immune: No excessive bruising noted.    03/13/2015 9:24 AM

## 2015-03-14 ENCOUNTER — Ambulatory Visit (HOSPITAL_COMMUNITY): Payer: Medicare Other | Admitting: Physical Therapy

## 2015-03-14 DIAGNOSIS — R2689 Other abnormalities of gait and mobility: Secondary | ICD-10-CM | POA: Diagnosis not present

## 2015-03-14 DIAGNOSIS — R262 Difficulty in walking, not elsewhere classified: Secondary | ICD-10-CM

## 2015-03-14 DIAGNOSIS — M25662 Stiffness of left knee, not elsewhere classified: Secondary | ICD-10-CM

## 2015-03-14 DIAGNOSIS — R29898 Other symptoms and signs involving the musculoskeletal system: Secondary | ICD-10-CM

## 2015-03-14 NOTE — Therapy (Signed)
[place], [place], [address] Phone: [phone]   Fax:  [phone]  Physical Therapy Treatment  Patient Details  Name: Laura Davenport MRN: [id number] Date of Birth: 1931/03/27 Referring Provider:  [name], MD  Encounter Date: 03/14/2015      PT End of Session - 03/14/15 1129    Visit Number 5   Number of Visits 24   Date for PT Re-Evaluation 04/23/15   Authorization - Visit Number 5   Authorization - Number of Visits 10   PT Start Time 1100   PT Stop Time [id number]   PT Time Calculation (min) 45 min   Activity Tolerance Patient limited by pain   Behavior During Therapy Anxious      Past Medical History  Diagnosis Date  . Type 2 diabetes mellitus   . Essential hypertension   . HLD (hyperlipidemia)   . Depression   . Septic arthritis     MRSA, left knee   . GI bleed     Severe gastritis and duodenal ulcers by EGD May 2016     Past Surgical History  Procedure Laterality Date  . Cholecystectomy N/A 11/04/2012    Procedure: LAPAROSCOPIC CHOLECYSTECTOMY;  Surgeon: Jamesetta So, MD;  Location: [facility];  Service: General;  Laterality: N/A;  . Knee surgery    . I&d extremity Left 12/24/2014    Procedure: IRRIGATION AND DEBRIDEMENT EXTREMITY AND ARTHROSCOPY KNEE;  Surgeon: Renette Butters, MD;  Location: [facility];  Service: Orthopedics;  Laterality: Left;  . Esophagogastroduodenoscopy N/A 01/06/2015    Procedure: ESOPHAGOGASTRODUODENOSCOPY (EGD);  Surgeon: Danie Binder, MD;  [facility];  Service: Endoscopy;  Laterality: N/A;  1130  . Savory dilation  01/06/2015    Procedure: SAVORY DILATION;  Surgeon: Danie Binder, MD;  [facility];  Service: Endoscopy;;    There were no vitals filed for this visit.  Visit Diagnosis:  Knee stiffness, left  Weakness of left leg  Poor balance  Difficulty walking      Subjective Assessment - 03/14/15 1133    Subjective PT states her daughter is  getting frustrated wtih her at home because she feels she's not doing enough.  PT states her grandaughter comes and works with her knee.  Currently 5/10 pain in her Lt knee.     Currently in Pain? Yes   Pain Score 5    Pain Location Knee   Pain Orientation Left            [facility] PT Assessment - 03/14/15 0001    AROM   Right/Left Knee Left   Left Knee Extension 34   Left Knee Flexion 110                     [facility] Adult PT Treatment/Exercise - 03/14/15 1130    Knee/Hip Exercises: Seated   Long Arc Quad Left;10 reps;AAROM   Sit to [employer] 5 reps;with UE support  to walker   Knee/Hip Exercises: Supine   Quad Sets Left;10 reps   Short Arc [employer];Left;10 reps   Heel Slides 10 reps;AAROM   Other Supine Knee/Hip Exercises ankle on bolster with gravity assisted knee extension 5 minutes  28 degrees at end of 5 minutes   Manual Therapy   Manual Therapy Soft tissue mobilization;Myofascial release   Soft tissue mobilization Lt knee to improve ROM   Myofascial Release to decrease adhesions  PT Short Term Goals - 03/07/15 1619    PT SHORT TERM GOAL #1   Title I HEP   Status On-going   PT SHORT TERM GOAL #2   Title PT to be able to transfer with moderate assist    PT SHORT TERM GOAL #3   Title Pt to be able to straighten knee to -15 to allow a more normalized gt.   Status On-going   PT SHORT TERM GOAL #4   Title Pt to be able to walk with RW and min assist x 50 ft.   PT SHORT TERM GOAL #5   Title Pain to be no greater than a 6/10           PT Long Term Goals - 03/07/15 1620    PT LONG TERM GOAL #1   Title Pt to be able to transfer with supervision   PT LONG TERM GOAL #2   Title Pt to be able to walk with walder and mod I x 10 minutes    PT LONG TERM GOAL #3   Title Pt extension to 5 degrees to allow a more normalized gait.   PT LONG TERM GOAL #4   Title Pt to be able to stand for 20 minutes without the use of the walker to be  able to make a small meal    PT LONG TERM GOAL #5   Title Pain to be no greater than a 2/10               Plan - 03/14/15 1135    Clinical Impression Statement Focused majority of session on decreasing adhesions, AAROM and PROM to increase extension.  Initially ROM 34 degrees but able to get extension to 28 degrees at end of session.  Worked on sit to Engineer, manufacturing systems with max cues for hand placement and form.  PT able to stand with mod assist, however unable to shift weight to LT LE.   Instructed patient to complete LAQ daily while sitting and encouraged to keep leg out in extension rther than bent when lying down/sleeping. Discussed ordering JAS brace with patient, daughter and evaluating therapist as this may help regain normal ROM.  ORder faxed to Dr. Willey Blade.   PT Next Visit Plan continue with focus on increasing LT knee ROM and strength progressing to standing and gait.  Obtain measurements and contact JAS rep when order received.          Problem List Patient Active Problem List   Diagnosis Date Noted  . Dysphagia 03/13/2015  . AP (abdominal pain)   . Hematemesis without nausea   . Atrial fibrillation and flutter   . AKI (acute kidney injury)   . Bacteremia   . Essential hypertension   . Septic joint of left knee joint   . Blood poisoning   . Fever 12/22/2014  . History of fractured kneecap 12/22/2014  . Knee fracture, left 12/22/2014  . Sepsis 12/22/2014  . Tachycardia with 141 - 160 beats per minute 12/22/2014  . Diabetes mellitus without complication   . Hypertension   . HLD (hyperlipidemia)   . Depression     Teena Irani, PTA/CLT [phone]  03/14/2015, 11:50 AM  [facility], [place], [address] Phone: [phone]   Fax:  [phone]

## 2015-03-16 ENCOUNTER — Ambulatory Visit (HOSPITAL_COMMUNITY): Payer: Medicare Other | Admitting: Physical Therapy

## 2015-03-16 DIAGNOSIS — M25662 Stiffness of left knee, not elsewhere classified: Secondary | ICD-10-CM | POA: Diagnosis not present

## 2015-03-16 DIAGNOSIS — R262 Difficulty in walking, not elsewhere classified: Secondary | ICD-10-CM

## 2015-03-16 DIAGNOSIS — R29898 Other symptoms and signs involving the musculoskeletal system: Secondary | ICD-10-CM | POA: Diagnosis not present

## 2015-03-16 DIAGNOSIS — R2689 Other abnormalities of gait and mobility: Secondary | ICD-10-CM | POA: Diagnosis not present

## 2015-03-16 NOTE — Therapy (Signed)
[place], [place], [address] Phone: [phone]   Fax:  [phone]  Physical Therapy Treatment  Patient Details  Name: Laura Davenport MRN: [id number] Date of Birth: 07/14/1931 Referring Provider:  [name], MD  Encounter Date: 03/16/2015      PT End of Session - 03/16/15 1202    Visit Number 6   Number of Visits 24   Date for PT Re-Evaluation 04/23/15   Authorization - Visit Number 6   Authorization - Number of Visits 10   PT Start Time 1100   PT Stop Time 1145   PT Time Calculation (min) 45 min   Activity Tolerance Patient limited by pain;Treatment limited secondary to medical complications (Comment)   Behavior During Therapy Anxious      Past Medical History  Diagnosis Date  . Type 2 diabetes mellitus   . Essential hypertension   . HLD (hyperlipidemia)   . Depression   . Septic arthritis     MRSA, left knee   . GI bleed     Severe gastritis and duodenal ulcers by EGD May 2016     Past Surgical History  Procedure Laterality Date  . Cholecystectomy N/A 11/04/2012    Procedure: LAPAROSCOPIC CHOLECYSTECTOMY;  Surgeon: Jamesetta So, MD;  Location: [facility];  Service: General;  Laterality: N/A;  . Knee surgery    . I&d extremity Left 12/24/2014    Procedure: IRRIGATION AND DEBRIDEMENT EXTREMITY AND ARTHROSCOPY KNEE;  Surgeon: Renette Butters, MD;  Location: [facility];  Service: Orthopedics;  Laterality: Left;  . Esophagogastroduodenoscopy N/A 01/06/2015    Procedure: ESOPHAGOGASTRODUODENOSCOPY (EGD);  Surgeon: Danie Binder, MD;  [facility];  Service: Endoscopy;  Laterality: N/A;  1130  . Savory dilation  01/06/2015    Procedure: SAVORY DILATION;  Surgeon: Danie Binder, MD;  [facility];  Service: Endoscopy;;    There were no vitals filed for this visit.  Visit Diagnosis:  Knee stiffness, left  Weakness of left leg  Poor balance  Difficulty walking      Subjective  Assessment - 03/16/15 1200    Subjective Pt reports her knee is sore today.  PT states her knee always hurts, especially at night getting up to nearly 10/10.  Order received for JAS brace.    Currently in Pain? Yes   Pain Score 3    Pain Location Knee   Pain Orientation Left                         [facility] Adult PT Treatment/Exercise - 03/16/15 0001    Knee/Hip Exercises: Supine   Quad Sets Left;10 reps   Short Arc [employer];Left;10 reps   Heel Slides 10 reps;AAROM   Other Supine Knee/Hip Exercises ankle on bolster with gravity assisted knee extension 5 minutes   Manual Therapy   Manual Therapy Soft tissue mobilization;Myofascial release   Soft tissue mobilization Lt knee to improve ROM   Myofascial Release to decrease adhesions                  PT Short Term Goals - 03/07/15 1619    PT SHORT TERM GOAL #1   Title I HEP   Status On-going   PT SHORT TERM GOAL #2   Title PT to be able to transfer with moderate assist    PT SHORT TERM GOAL #3   Title Pt to be  able to straighten knee to -15 to allow a more normalized gt.   Status On-going   PT SHORT TERM GOAL #4   Title Pt to be able to walk with RW and min assist x 50 ft.   PT SHORT TERM GOAL #5   Title Pain to be no greater than a 6/10           PT Long Term Goals - 03/07/15 1620    PT LONG TERM GOAL #1   Title Pt to be able to transfer with supervision   PT LONG TERM GOAL #2   Title Pt to be able to walk with walder and mod I x 10 minutes    PT LONG TERM GOAL #3   Title Pt extension to 5 degrees to allow a more normalized gait.   PT LONG TERM GOAL #4   Title Pt to be able to stand for 20 minutes without the use of the walker to be able to make a small meal    PT LONG TERM GOAL #5   Title Pain to be no greater than a 2/10               Plan - 03/16/15 1203    Clinical Impression Statement JAS order received and Lt LE measured.  Measurement sheet, signed order and demongraphics  sheet faxed to [facility]. Pt had BM during session (wearing depends) so limited mobility today.  Focused on manual and stretching Lt knee.  Able to get to 30 degrees extension only today.     PT Next Visit Plan continue with focus on increasing LT knee ROM and strength progressing to standing and gait. Follow up with JAS until representative has made contact with patient.         Problem List Patient Active Problem List   Diagnosis Date Noted  . Dysphagia 03/13/2015  . AP (abdominal pain)   . Hematemesis without nausea   . Atrial fibrillation and flutter   . AKI (acute kidney injury)   . Bacteremia   . Essential hypertension   . Septic joint of left knee joint   . Blood poisoning   . Fever 12/22/2014  . History of fractured kneecap 12/22/2014  . Knee fracture, left 12/22/2014  . Sepsis 12/22/2014  . Tachycardia with 141 - 160 beats per minute 12/22/2014  . Diabetes mellitus without complication   . Hypertension   . HLD (hyperlipidemia)   . Depression     Teena Irani, PTA/CLT [phone]  03/16/2015, 12:11 PM  [facility] [address] [place], [place], [address] Phone: [phone]   Fax:  [phone]

## 2015-03-17 ENCOUNTER — Ambulatory Visit (HOSPITAL_COMMUNITY)
Admission: RE | Admit: 2015-03-17 | Discharge: 2015-03-17 | Disposition: A | Discharge status: Home / Self Care | Payer: Medicare Other | Source: Ambulatory Visit | Attending: Nurse Practitioner | Admitting: Nurse Practitioner

## 2015-03-17 DIAGNOSIS — R0989 Other specified symptoms and signs involving the circulatory and respiratory systems: Secondary | ICD-10-CM | POA: Diagnosis not present

## 2015-03-17 DIAGNOSIS — R1310 Dysphagia, unspecified: Secondary | ICD-10-CM | POA: Insufficient documentation

## 2015-03-17 DIAGNOSIS — R1314 Dysphagia, pharyngoesophageal phase: Secondary | ICD-10-CM

## 2015-03-17 NOTE — Progress Notes (Signed)
CC'ED TO PCP 

## 2015-03-20 DIAGNOSIS — S82092D Other fracture of left patella, subsequent encounter for closed fracture with routine healing: Secondary | ICD-10-CM | POA: Diagnosis not present

## 2015-03-21 ENCOUNTER — Ambulatory Visit (HOSPITAL_COMMUNITY): Payer: Medicare Other | Admitting: Physical Therapy

## 2015-03-21 DIAGNOSIS — M25662 Stiffness of left knee, not elsewhere classified: Secondary | ICD-10-CM | POA: Diagnosis not present

## 2015-03-21 DIAGNOSIS — R29898 Other symptoms and signs involving the musculoskeletal system: Secondary | ICD-10-CM

## 2015-03-21 DIAGNOSIS — R262 Difficulty in walking, not elsewhere classified: Secondary | ICD-10-CM | POA: Diagnosis not present

## 2015-03-21 DIAGNOSIS — R2689 Other abnormalities of gait and mobility: Secondary | ICD-10-CM

## 2015-03-21 NOTE — Therapy (Signed)
[place], [place], [address] Phone: [phone]   Fax:  [phone]  Physical Therapy Treatment  Patient Details  Name: Laura Davenport MRN: [id number] Date of Birth: June 14, 1931 Referring Provider:  [name], MD  Encounter Date: 03/21/2015      PT End of Session - 03/21/15 1125    Visit Number 7   Number of Visits 43   Authorization - Visit Number 7   Authorization - Number of Visits 10   PT Start Time [id number]   PT Stop Time 1150   PT Time Calculation (min) 45 min   Activity Tolerance Patient tolerated treatment well      Past Medical History  Diagnosis Date  . Type 2 diabetes mellitus   . Essential hypertension   . HLD (hyperlipidemia)   . Depression   . Septic arthritis     MRSA, left knee   . GI bleed     Severe gastritis and duodenal ulcers by EGD May 2016     Past Surgical History  Procedure Laterality Date  . Cholecystectomy N/A 11/04/2012    Procedure: LAPAROSCOPIC CHOLECYSTECTOMY;  Surgeon: Jamesetta So, MD;  Location: [facility];  Service: General;  Laterality: N/A;  . Knee surgery    . I&d extremity Left 12/24/2014    Procedure: IRRIGATION AND DEBRIDEMENT EXTREMITY AND ARTHROSCOPY KNEE;  Surgeon: Renette Butters, MD;  Location: [facility];  Service: Orthopedics;  Laterality: Left;  . Esophagogastroduodenoscopy N/A 01/06/2015    Procedure: ESOPHAGOGASTRODUODENOSCOPY (EGD);  Surgeon: Danie Binder, MD;  [facility];  Service: Endoscopy;  Laterality: N/A;  1130  . Savory dilation  01/06/2015    Procedure: SAVORY DILATION;  Surgeon: Danie Binder, MD;  [facility];  Service: Endoscopy;;    There were no vitals filed for this visit.  Visit Diagnosis:  Knee stiffness, left  Weakness of left leg  Poor balance  Difficulty walking      Subjective Assessment - 03/21/15 1105    Subjective Pt is doing her exercises states she stays sore because of it. Pt states sometimes her  knee hurts other times it doesn't    Currently in Pain? No/denies   Pain Score 0-No pain              [facility] Adult PT Treatment/Exercise - 03/21/15 0001    Knee/Hip Exercises: Seated   Long Arc Quad AAROM;Left;10 reps   Knee/Hip Exercises: Supine   Quad Sets 20 reps   Quad Sets Limitations with russian stim    Heel Slides 10 reps;AAROM   Bridges 10 reps   Straight Leg Raises Strengthening;10 reps   Knee/Hip Exercises: Sidelying   Hip ABduction 10 reps   Other Sidelying Knee/Hip Exercises knee flexion/extenxion x 10    Modalities   Modalities Electrical Stimulation-Russisan   Electrical Stimulation   Electrical Stimulation Location 4 electode on quads   Electrical Stimulation Action quad contraction    Electrical Stimulation Parameters duty 50%; cycle 10/10, Burst 1000 bps treatment 10:00    Electrical Stimulation Goals Strength                  PT Short Term Goals - 03/07/15 1619    PT SHORT TERM GOAL #1   Title I HEP   Status On-going   PT SHORT TERM GOAL #2   Title PT to be able to transfer with moderate assist    PT SHORT TERM GOAL #3  Title Pt to be able to straighten knee to -15 to allow a more normalized gt.   Status On-going   PT SHORT TERM GOAL #4   Title Pt to be able to walk with RW and min assist x 50 ft.   PT SHORT TERM GOAL #5   Title Pain to be no greater than a 6/10           PT Long Term Goals - 03/07/15 1620    PT LONG TERM GOAL #1   Title Pt to be able to transfer with supervision   PT LONG TERM GOAL #2   Title Pt to be able to walk with walder and mod I x 10 minutes    PT LONG TERM GOAL #3   Title Pt extension to 5 degrees to allow a more normalized gait.   PT LONG TERM GOAL #4   Title Pt to be able to stand for 20 minutes without the use of the walker to be able to make a small meal    PT LONG TERM GOAL #5   Title Pain to be no greater than a 2/10               Plan - 03/21/15 1126    Clinical Impression Statement  Pt continues to have only a trace mm with quadricep therefore we added russian stimulation to program.     PT Next Visit Plan increase Russian stim to 15 minutes if pt tolerated stimulation, ( not to sore that evening).  Continue to focus on standing ability, Strength and ROM         Problem List Patient Active Problem List   Diagnosis Date Noted  . Dysphagia 03/13/2015  . AP (abdominal pain)   . Hematemesis without nausea   . Atrial fibrillation and flutter   . AKI (acute kidney injury)   . Bacteremia   . Essential hypertension   . Septic joint of left knee joint   . Blood poisoning   . Fever 12/22/2014  . History of fractured kneecap 12/22/2014  . Knee fracture, left 12/22/2014  . Sepsis 12/22/2014  . Tachycardia with 141 - 160 beats per minute 12/22/2014  . Diabetes mellitus without complication   . Hypertension   . HLD (hyperlipidemia)   . Depression    Treesa Grate, [place] CLT [phone] 03/21/2015, 12:20 PM  [place], [place], [address] Phone: [phone]   Fax:  [phone]

## 2015-03-23 ENCOUNTER — Ambulatory Visit (HOSPITAL_COMMUNITY): Payer: Medicare Other

## 2015-03-23 DIAGNOSIS — R262 Difficulty in walking, not elsewhere classified: Secondary | ICD-10-CM | POA: Diagnosis not present

## 2015-03-23 DIAGNOSIS — M25662 Stiffness of left knee, not elsewhere classified: Secondary | ICD-10-CM

## 2015-03-23 DIAGNOSIS — R2689 Other abnormalities of gait and mobility: Secondary | ICD-10-CM

## 2015-03-23 DIAGNOSIS — R29898 Other symptoms and signs involving the musculoskeletal system: Secondary | ICD-10-CM | POA: Diagnosis not present

## 2015-03-23 NOTE — Therapy (Signed)
[facility] [place], [place], [address] Phone: [phone]   Fax:  [phone]  Physical Therapy Treatment  Patient Details  Name: Laura Davenport MRN: [id number] Date of Birth: December 11, 1930 Referring Provider:  [name], MD  Encounter Date: 03/23/2015      PT End of Session - 03/23/15 1250    Visit Number 8   Number of Visits 24   Date for PT Re-Evaluation 04/23/15   Authorization - Visit Number 8   Authorization - Number of Visits 10   PT Start Time [id number]   PT Stop Time [id number]   PT Time Calculation (min) 30 min   Activity Tolerance Patient limited by pain   Behavior During Therapy Anxious  weepy.       Past Medical History  Diagnosis Date  . Type 2 diabetes mellitus   . Essential hypertension   . HLD (hyperlipidemia)   . Depression   . Septic arthritis     MRSA, left knee   . GI bleed     Severe gastritis and duodenal ulcers by EGD May 2016     Past Surgical History  Procedure Laterality Date  . Cholecystectomy N/A 11/04/2012    Procedure: LAPAROSCOPIC CHOLECYSTECTOMY;  Surgeon: Jamesetta So, MD;  [facility]: [facility];  Service: General;  Laterality: N/A;  . Knee surgery    . I&d extremity Left 12/24/2014    Procedure: IRRIGATION AND DEBRIDEMENT EXTREMITY AND ARTHROSCOPY KNEE;  Surgeon: Renette Butters, MD;  Location: [facility];  Service: Orthopedics;  Laterality: Left;  . Esophagogastroduodenoscopy N/A 01/06/2015    Procedure: ESOPHAGOGASTRODUODENOSCOPY (EGD);  Surgeon: Danie Binder, MD;  [facility];  Service: Endoscopy;  Laterality: N/A;  1130  . Savory dilation  01/06/2015    Procedure: SAVORY DILATION;  Surgeon: Danie Binder, MD;  [facility];  Service: Endoscopy;;    There were no vitals filed for this visit.  Visit Diagnosis:  Knee stiffness, left  Weakness of left leg  Poor balance  Difficulty walking      Subjective Assessment - 03/23/15 1122    Subjective Pt is still try  to do HEP as best she can, but reports that they make her tired. Knee pain remains about the same.                          [facility] Adult PT Treatment/Exercise - 03/23/15 0001    Transfers   Transfers Sit to Stand  Pt reluctant to come up all way, max-total assist.    Knee/Hip Exercises: Stretches   Passive Hamstring Stretch 5 reps  Pt began crying, trying to with stand, ceases all PROM.    Knee/Hip Exercises: Supine   Quad Sets 10 reps;Right;Strengthening   [employer] AAROM;Right;15 reps   Heel Slides 10 reps;AAROM;Right   Bridges --   Straight Leg Raises Strengthening;10 reps;AAROM;Right   Other Supine Knee/Hip Exercises Abduction/adduction heel slides  15x   Manual Therapy   Manual Therapy Edema management   Edema Management manual decongestion  LLE x10 minutes                  PT Short Term Goals - 03/07/15 1619    PT SHORT TERM GOAL #1   Title I HEP   Status On-going   PT SHORT TERM GOAL #2   Title PT to be able to transfer with moderate assist  PT SHORT TERM GOAL #3   Title Pt to be able to straighten knee to -15 to allow a more normalized gt.   Status On-going   PT SHORT TERM GOAL #4   Title Pt to be able to walk with RW and min assist x 50 ft.   PT SHORT TERM GOAL #5   Title Pain to be no greater than a 6/10           PT Long Term Goals - 03/07/15 1620    PT LONG TERM GOAL #1   Title Pt to be able to transfer with supervision   PT LONG TERM GOAL #2   Title Pt to be able to walk with walder and mod I x 10 minutes    PT LONG TERM GOAL #3   Title Pt extension to 5 degrees to allow a more normalized gait.   PT LONG TERM GOAL #4   Title Pt to be able to stand for 20 minutes without the use of the walker to be able to make a small meal    PT LONG TERM GOAL #5   Title Pain to be no greater than a 2/10               Plan - 03/23/15 1252    Clinical Impression Statement Limited treatment time today due to poor pt  tolerance to LLE PROM. BLE Stage IV pitting edema is a growing concern, questioning potential venous-stasis-like degenerative changes to skin integrity, will folow-up c referring provider to see if pt is appropriate for compression garments or other. Pt remains very motivated to progress c therapy, but expresses frustration associated c related fatigue. Will continue c program as previosuly described and liit to pt tolerance.    Pt will benefit from skilled therapeutic intervention in order to improve on the following deficits Decreased activity tolerance;Decreased balance;Decreased mobility;Decreased strength;Decreased range of motion;Difficulty walking;Pain   Rehab Potential Good   PT Frequency 3x / week   PT Duration 8 weeks   PT Treatment/Interventions Therapeutic exercise;Therapeutic activities;Functional mobility training;Gait training;Patient/family education   PT Next Visit Plan increase Russian stim to 15 minutes if pt tolerated stimulation, ( not to sore that evening).  Continue to focus on standing ability, Strength and ROM    PT Home Exercise Plan given   Recommended Other Services Will connect c MD regarding compression for BLE.    Consulted and Agree with Plan of Care Patient        Problem List Patient Active Problem List   Diagnosis Date Noted  . Dysphagia 03/13/2015  . AP (abdominal pain)   . Hematemesis without nausea   . Atrial fibrillation and flutter   . AKI (acute kidney injury)   . Bacteremia   . Essential hypertension   . Septic joint of left knee joint   . Blood poisoning   . Fever 12/22/2014  . History of fractured kneecap 12/22/2014  . Knee fracture, left 12/22/2014  . Sepsis 12/22/2014  . Tachycardia with 141 - 160 beats per minute 12/22/2014  . Diabetes mellitus without complication   . Hypertension   . HLD (hyperlipidemia)   . Depression     Zyrus Hetland C 03/23/2015, 12:57 PM  12:58 PM  Etta Grandchild, PT, DPT [place] License #  [id number]       [facility] [address] [place], [place], [address] Phone: [phone]   Fax:  [phone]

## 2015-03-23 NOTE — Patient Instructions (Signed)
Continue with HEP as best she can tolerate given fluctuations in energy levels.

## 2015-03-28 ENCOUNTER — Ambulatory Visit (HOSPITAL_COMMUNITY): Payer: Medicare Other | Admitting: Physical Therapy

## 2015-03-28 DIAGNOSIS — R29898 Other symptoms and signs involving the musculoskeletal system: Secondary | ICD-10-CM | POA: Diagnosis not present

## 2015-03-28 DIAGNOSIS — R2689 Other abnormalities of gait and mobility: Secondary | ICD-10-CM | POA: Diagnosis not present

## 2015-03-28 DIAGNOSIS — R262 Difficulty in walking, not elsewhere classified: Secondary | ICD-10-CM | POA: Diagnosis not present

## 2015-03-28 DIAGNOSIS — M25662 Stiffness of left knee, not elsewhere classified: Secondary | ICD-10-CM | POA: Diagnosis not present

## 2015-03-28 NOTE — Therapy (Signed)
[facility] [place], [place], [address] Phone: [phone]   Fax:  [phone]  Physical Therapy Treatment  Patient Details  Name: Laura Davenport MRN: [id number] Date of Birth: Nov 25, 1930 Referring Provider:  [name], MD  Encounter Date: 03/28/2015      PT End of Session - 03/28/15 1303    Visit Number 9   Number of Visits 24   Date for PT Re-Evaluation 04/23/15   Authorization - Visit Number 9   Authorization - Number of Visits 10   PT Start Time 1110   PT Stop Time 1200   PT Time Calculation (min) 50 min   Activity Tolerance Patient tolerated treatment well      Past Medical History  Diagnosis Date  . Type 2 diabetes mellitus   . Essential hypertension   . HLD (hyperlipidemia)   . Depression   . Septic arthritis     MRSA, left knee   . GI bleed     Severe gastritis and duodenal ulcers by EGD May 2016     Past Surgical History  Procedure Laterality Date  . Cholecystectomy N/A 11/04/2012    Procedure: LAPAROSCOPIC CHOLECYSTECTOMY;  Surgeon: Jamesetta So, MD;  Location: [facility];  Service: General;  Laterality: N/A;  . Knee surgery    . I&d extremity Left 12/24/2014    Procedure: IRRIGATION AND DEBRIDEMENT EXTREMITY AND ARTHROSCOPY KNEE;  Surgeon: Renette Butters, MD;  Location: [facility];  Service: Orthopedics;  Laterality: Left;  . Esophagogastroduodenoscopy N/A 01/06/2015    Procedure: ESOPHAGOGASTRODUODENOSCOPY (EGD);  Surgeon: Danie Binder, MD;  [facility];  Service: Endoscopy;  Laterality: N/A;  1130  . Savory dilation  01/06/2015    Procedure: SAVORY DILATION;  Surgeon: Danie Binder, MD;  [facility];  Service: Endoscopy;;    There were no vitals filed for this visit.  Visit Diagnosis:  Knee stiffness, left  Weakness of left leg  Poor balance      Subjective Assessment - 03/28/15 1257    Subjective PT states that her Lt knee becomes very painful when she puts weight on  it.  Therapist tried to explain to pt that this is secondary to stiffness and weakness which is caused by not using the knee.    Currently in Pain? Yes   Pain Score 6   with weight bearing.                          [facility] Adult PT Treatment/Exercise - 03/28/15 0001    Knee/Hip Exercises: Standing   Other Standing Knee Exercises gait at // with min assist of one with second person folllowing with wheelchair x 3    Knee/Hip Exercises: Seated   Sit to Sand 10 reps  standing as long as possible trying to pt weight on Lt LE.    Knee/Hip Exercises: Supine   Quad Sets 20 reps   Quad Sets Limitations with russian stimulatin    Modalities   Modalities Retail [employer] Location 4 electode on quads with co-contraction ; 10/10    Electrical Stimulation Goals Strength                PT Education - 03/28/15 1301    Education provided Yes   Education Details Explained to pt that she is physically acting like a parapeligic and does not even use her non  involved leg when transfering.  Explained that she must stand and move to get stronger,( pt tends to do a sliding board transfer when transferring out ot wheelchair.)   Person(s) Educated Patient   Methods Explanation;Demonstration   Comprehension Verbalized understanding          PT Short Term Goals - 03/07/15 1619    PT SHORT TERM GOAL #1   Title I HEP   Status On-going   PT SHORT TERM GOAL #2   Title PT to be able to transfer with moderate assist    PT SHORT TERM GOAL #3   Title Pt to be able to straighten knee to -15 to allow a more normalized gt.   Status On-going   PT SHORT TERM GOAL #4   Title Pt to be able to walk with RW and min assist x 50 ft.   PT SHORT TERM GOAL #5   Title Pain to be no greater than a 6/10           PT Long Term Goals - 03/07/15 1620    PT LONG TERM GOAL #1   Title Pt to be able to transfer with supervision   PT LONG TERM  GOAL #2   Title Pt to be able to walk with walder and mod I x 10 minutes    PT LONG TERM GOAL #3   Title Pt extension to 5 degrees to allow a more normalized gait.   PT LONG TERM GOAL #4   Title Pt to be able to stand for 20 minutes without the use of the walker to be able to make a small meal    PT LONG TERM GOAL #5   Title Pain to be no greater than a 2/10               Plan - 03/28/15 1304    Clinical Impression Statement Pt very excited about walking today.  Pt verbalized understanding that although her LT LE is involved this does not mean that she can not use her Rt LE and arms to assist with transfers.  Therapist encouraged stand pivot transfers rather than sliding transfer.    PT Next Visit Plan Continue with  [place] stim and gt training.         Problem List Patient Active Problem List   Diagnosis Date Noted  . Dysphagia 03/13/2015  . AP (abdominal pain)   . Hematemesis without nausea   . Atrial fibrillation and flutter   . AKI (acute kidney injury)   . Bacteremia   . Essential hypertension   . Septic joint of left knee joint   . Blood poisoning   . Fever 12/22/2014  . History of fractured kneecap 12/22/2014  . Knee fracture, left 12/22/2014  . Sepsis 12/22/2014  . Tachycardia with 141 - 160 beats per minute 12/22/2014  . Diabetes mellitus without complication   . Hypertension   . HLD (hyperlipidemia)   . Depression    Maylasia Shobe, [place] CLT [phone]  03/28/2015, 1:08 PM  [facility] [address] [place], [place], [address] Phone: [phone]   Fax:  [phone]

## 2015-03-29 DIAGNOSIS — S82002A Unspecified fracture of left patella, initial encounter for closed fracture: Secondary | ICD-10-CM | POA: Diagnosis not present

## 2015-03-29 DIAGNOSIS — S42301A Unspecified fracture of shaft of humerus, right arm, initial encounter for closed fracture: Secondary | ICD-10-CM | POA: Diagnosis not present

## 2015-03-30 ENCOUNTER — Ambulatory Visit (HOSPITAL_COMMUNITY): Payer: Medicare Other | Admitting: Physical Therapy

## 2015-03-30 DIAGNOSIS — R2689 Other abnormalities of gait and mobility: Secondary | ICD-10-CM | POA: Diagnosis not present

## 2015-03-30 DIAGNOSIS — R29898 Other symptoms and signs involving the musculoskeletal system: Secondary | ICD-10-CM | POA: Diagnosis not present

## 2015-03-30 DIAGNOSIS — M25662 Stiffness of left knee, not elsewhere classified: Secondary | ICD-10-CM

## 2015-03-30 DIAGNOSIS — R262 Difficulty in walking, not elsewhere classified: Secondary | ICD-10-CM

## 2015-03-30 NOTE — Therapy (Addendum)
[facility] [place], [place], [address] Phone: [phone]   Fax:  [phone]  Physical Therapy Treatment  Patient Details  Name: Laura Davenport MRN: [id number] Date of Birth: 17-Nov-1930 Referring Provider:  [name], MD  Encounter Date: 03/30/2015      PT End of Session - 03/30/15 1256    Visit Number 10   Number of Visits 24   Date for PT Re-Evaluation 04/23/15   Authorization - Visit Number 10   Authorization - Number of Visits 10   PT Start Time 1100   PT Stop Time 1200   PT Time Calculation (min) 60 min   Equipment Utilized During Treatment Gait belt   Activity Tolerance Patient tolerated treatment well   Behavior During Therapy [facility] for tasks assessed/performed      Past Medical History  Diagnosis Date  . Type 2 diabetes mellitus   . Essential hypertension   . HLD (hyperlipidemia)   . Depression   . Septic arthritis     MRSA, left knee   . GI bleed     Severe gastritis and duodenal ulcers by EGD May 2016     Past Surgical History  Procedure Laterality Date  . Cholecystectomy N/A 11/04/2012    Procedure: LAPAROSCOPIC CHOLECYSTECTOMY;  Surgeon: Jamesetta So, MD;  Location: [facility];  Service: General;  Laterality: N/A;  . Knee surgery    . I&d extremity Left 12/24/2014    Procedure: IRRIGATION AND DEBRIDEMENT EXTREMITY AND ARTHROSCOPY KNEE;  Surgeon: Renette Butters, MD;  Location: [facility];  Service: Orthopedics;  Laterality: Left;  . Esophagogastroduodenoscopy N/A 01/06/2015    Procedure: ESOPHAGOGASTRODUODENOSCOPY (EGD);  Surgeon: Danie Binder, MD;  [facility];  Service: Endoscopy;  Laterality: N/A;  1130  . Savory dilation  01/06/2015    Procedure: SAVORY DILATION;  Surgeon: Danie Binder, MD;  [facility];  Service: Endoscopy;;    There were no vitals filed for this visit.  Visit Diagnosis:  Knee stiffness, left  Weakness of left leg  Poor balance  Difficulty  walking      Subjective Assessment - 03/30/15 1136    Subjective Pt states that her knee pain is about a "5" ; Pt was not too sore after last treatment.   Pertinent History HTN, DM    How long can you sit comfortably? an hour   How long can you stand comfortably? with the walker  5-10 minutes   How long can you walk comfortably? I am trying I can't really do any walking    Currently in Pain? Yes   Pain Score 5    Pain Location Leg   Pain Orientation Left   Pain Descriptors / Indicators Aching            [facility] PT Assessment - 03/30/15 0001    Assessment   Medical Diagnosis Lt patella fx    Onset Date/Surgical Date --  11/18/2014   Next MD Visit --  unknown   Prior Therapy SNF   Precautions   Precautions Fall   Restrictions   Weight Bearing Restrictions No   Home Environment   Living Environment Private residence   Living Arrangements --  lives with daughter    Type of [place] to enter  ramps in the back; stairs in front   [place] One level   Prior Function   Level of [place] Independent  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 for tasks assessed   Observation/Other Assessments   Focus on Therapeutic Outcomes (FOTO)  26  was 26   AROM   Left Knee Extension 30   Left Knee Flexion 100   Strength   Left Hip Flexion 2+/5   Left Hip Extension 3-/5   Left Hip ABduction 3/5   Left Knee Flexion 3+/5   Left Knee Extension 2-/5   Left Ankle Dorsiflexion 3+/5   Left Ankle Plantar Flexion 2-/5   Transfers   Transfers Sit to Stand   Sit to Stand 2: Max assist   Ambulation/Gait   Ambulation/Gait No                     [facility] Adult PT Treatment/Exercise - 03/30/15 0001    Ambulation/Gait   Ambulation/Gait Assistance 3: Mod assist   Ambulation/Gait Assistance Details mod of one with someone pushing wheelchair behing the patient.    Ambulation Distance (Feet) 12 Feet   Assistive  device Rolling walker   Pre-Gait Activities Pt ambulated in // to build confidence with min assist x 2    Knee/Hip Exercises: Supine   Quad Sets 20 reps   Heel Slides 20 reps   Heel Slides Limitations on sliding board    Modalities   Modalities Electrical Stimulation   Electrical Stimulation   Electrical Stimulation Location 4 electode on quads   Electrical Stimulation Parameters cocontraction; 1100 bbp;, x 10"   Electrical Stimulation Goals Strength                PT Education - 03/30/15 1142    Education provided Yes   Education Details gt train with rolling walker    Person(s) Educated Patient   Methods Explanation   Comprehension Verbalized understanding;Returned demonstration          PT Short Term Goals - 03/07/15 1619    PT SHORT TERM GOAL #1   Title I HEP   Status On-going   PT SHORT TERM GOAL #2   Title PT to be able to transfer with moderate assist    PT SHORT TERM GOAL #3   Title Pt to be able to straighten knee to -15 to allow a more normalized gt.   Status On-going   PT SHORT TERM GOAL #4   Title Pt to be able to walk with RW and min assist x 50 ft.   PT SHORT TERM GOAL #5   Title Pain to be no greater than a 6/10           PT Long Term Goals - 03/07/15 1620    PT LONG TERM GOAL #1   Title Pt to be able to transfer with supervision   PT LONG TERM GOAL #2   Title Pt to be able to walk with walder and mod I x 10 minutes    PT LONG TERM GOAL #3   Title Pt extension to 5 degrees to allow a more normalized gait.   PT LONG TERM GOAL #4   Title Pt to be able to stand for 20 minutes without the use of the walker to be able to make a small meal    PT LONG TERM GOAL #5   Title Pain to be no greater than a 2/10               Plan - 03/30/15 1256    Clinical Impression Statement Pt only  able to take 4-6 steps with rolling walker before she needs to sit and rest but resting time is minimal.  Pt needs verbal cuing for increased step length, to  keep feet apart and to keep her head up when ambulating.  pt continues to be pleased that she is finally walking a little.  Pt realizes she will never be a community ambulator but would like to be able to walk around in her home.    Pt will benefit from skilled therapeutic intervention in order to improve on the following deficits Decreased activity tolerance;Decreased balance;Decreased mobility;Decreased strength;Decreased range of motion;Difficulty walking;Pain   Rehab Potential Good   PT Frequency 3x / week   PT Duration 8 weeks   PT Treatment/Interventions Therapeutic exercise;Therapeutic activities;Functional mobility training;Gait training;Patient/family education   PT Next Visit Plan Continue with  [place] stim and gt training.           G-Codes - Apr 21, 2015 1300    Functional Assessment Tool Used foto   Functional Limitation Mobility: Walking and moving around   Mobility: Walking and Moving Around Current Status [phone]) At least 60 percent but less than 80 percent impaired, limited or restricted   Mobility: Walking and Moving Around Goal Status [id number]) At least 40 percent but less than 60 percent impaired, limited or restricted      Problem List Patient Active Problem List   Diagnosis Date Noted  . Dysphagia 03/13/2015  . AP (abdominal pain)   . Hematemesis without nausea   . Atrial fibrillation and flutter   . AKI (acute kidney injury)   . Bacteremia   . Essential hypertension   . Septic joint of left knee joint   . Blood poisoning   . Fever 12/22/2014  . History of fractured kneecap 12/22/2014  . Knee fracture, left 12/22/2014  . Sepsis 12/22/2014  . Tachycardia with 141 - 160 beats per minute 12/22/2014  . Diabetes mellitus without complication   . Hypertension   . HLD (hyperlipidemia)   . Depression     Rayetta Humphrey, PT CLT [phone]/28/2016, 1:00 PM  [facility] [address] [place], [place], [address] Phone:  [phone]   Fax:  [phone]    Physical Therapy Progress Note  Dates of Reporting Period 02-22-2015 to Apr 21, 2015  Objective Reports of Subjective Statement: PT unable to walk at home  Objective Measurements: pt has improved in mm but remains very weak.  Is able to take up to 6 steps with walker in therapy at this point was w/c bound  Goal Update: see above   Plan: continue to work with pt on gait training and strengthening to progress pt to be able to ambulate in her home.  Reason Skilled Services are Required: pt has contractures, very anxious ,postural deviations and significant strength and balance deficits.   Rayetta Humphrey, [place] CLT [phone]

## 2015-04-04 DIAGNOSIS — K2901 Acute gastritis with bleeding: Secondary | ICD-10-CM | POA: Diagnosis not present

## 2015-04-04 DIAGNOSIS — E119 Type 2 diabetes mellitus without complications: Secondary | ICD-10-CM | POA: Diagnosis not present

## 2015-04-04 DIAGNOSIS — M0020 Other streptococcal arthritis, unspecified joint: Secondary | ICD-10-CM | POA: Diagnosis not present

## 2015-04-06 ENCOUNTER — Ambulatory Visit (HOSPITAL_COMMUNITY): Payer: Medicare Other | Attending: Internal Medicine | Admitting: Physical Therapy

## 2015-04-06 DIAGNOSIS — R262 Difficulty in walking, not elsewhere classified: Secondary | ICD-10-CM | POA: Insufficient documentation

## 2015-04-06 DIAGNOSIS — R29898 Other symptoms and signs involving the musculoskeletal system: Secondary | ICD-10-CM | POA: Diagnosis present

## 2015-04-06 DIAGNOSIS — M25662 Stiffness of left knee, not elsewhere classified: Secondary | ICD-10-CM | POA: Diagnosis present

## 2015-04-06 DIAGNOSIS — R2689 Other abnormalities of gait and mobility: Secondary | ICD-10-CM | POA: Insufficient documentation

## 2015-04-06 NOTE — Therapy (Signed)
[place], [place], [address] Phone: [phone]   Fax:  [phone]  Physical Therapy Treatment  Patient Details  Name: Laura Davenport MRN: [id number] Date of Birth: [birth year] Referring Provider:  [name], MD  Encounter Date: 04/06/2015      PT End of Session - 04/06/15 1539    Visit Number 11   Number of Visits 24   Date for PT Re-Evaluation 04/29/15   Authorization - Visit Number 11   Authorization - Number of Visits 20   PT Start Time [id number]   PT Stop Time 1432   PT Time Calculation (min) 47 min   Equipment Utilized During Treatment Gait belt   Activity Tolerance Other (comment)  due to anxiety   Behavior During Therapy Anxious      Past Medical History  Diagnosis Date  . Type 2 diabetes mellitus   . Essential hypertension   . HLD (hyperlipidemia)   . Depression   . Septic arthritis     MRSA, left knee   . GI bleed     Severe gastritis and duodenal ulcers by EGD May 2016     Past Surgical History  Procedure Laterality Date  . Cholecystectomy N/A 11/04/2012    Procedure: LAPAROSCOPIC CHOLECYSTECTOMY;  Surgeon: Jamesetta So, MD;  [facility];  Service: General;  Laterality: N/A;  . Knee surgery    . I&d extremity Left 12/24/2014    Procedure: IRRIGATION AND DEBRIDEMENT EXTREMITY AND ARTHROSCOPY KNEE;  Surgeon: Renette Butters, MD;  Location: [facility];  Service: Orthopedics;  Laterality: Left;  . Esophagogastroduodenoscopy N/A 01/06/2015    Procedure: ESOPHAGOGASTRODUODENOSCOPY (EGD);  Surgeon: Danie Binder, MD;  [facility];  Service: Endoscopy;  Laterality: N/A;  1130  . Savory dilation  01/06/2015    Procedure: SAVORY DILATION;  Surgeon: Danie Binder, MD;  [facility];  Service: Endoscopy;;    There were no vitals filed for this visit.  Visit Diagnosis:  Knee stiffness, left  Weakness of left leg  Poor balance  Difficulty walking      Subjective  Assessment - 04/06/15 1535    Subjective Pt states that she is very nervous todsy.    Currently in Pain? Yes   Pain Score 6    Pain Location Leg   Pain Orientation Left            [facility] Adult PT Treatment/Exercise - 04/06/15 0001    Transfers   Transfers Sit to Stand   Sit to Stand 2: Max assist  x10   Ambulation/Gait   Ambulation/Gait Yes   Ambulation/Gait Assistance 3: Mod assist   Ambulation Distance (Feet) 10 Feet  x3 at //; multiple attempts to ambulate with rolling walker but pt becomes to fearful.     --               PT Short Term Goals - 03/07/15 1619    PT SHORT TERM GOAL #1   Title I HEP   Status On-going   PT SHORT TERM GOAL #2   Title PT to be able to transfer with moderate assist    PT SHORT TERM GOAL #3   Title Pt to be able to straighten knee to -15 to allow a more normalized gt.   Status On-going   PT SHORT TERM GOAL #4   Title Pt to be able to walk with RW and min assist x 50 ft.  PT SHORT TERM GOAL #5   Title Pain to be no greater than a 6/10           PT Long Term Goals - 03/07/15 1620    PT LONG TERM GOAL #1   Title Pt to be able to transfer with supervision   PT LONG TERM GOAL #2   Title Pt to be able to walk with walder and mod I x 10 minutes    PT LONG TERM GOAL #3   Title Pt extension to 5 degrees to allow a more normalized gait.   PT LONG TERM GOAL #4   Title Pt to be able to stand for 20 minutes without the use of the walker to be able to make a small meal    PT LONG TERM GOAL #5   Title Pain to be no greater than a 2/10               Plan - 04/06/15 1540    Clinical Impression Statement Pt is extremely anxious today.  Unable to get pt to amublate outside the parallel bars even though she has done this before.  Attempted to gait train pt using the biodex weight bearing support system but pt became too anxious even try to stand with vest on an total support.  Therapist spoke to pt about the need to trust that the  therapist is not going to allow her to fall but patient was to anxious to allow any further treatment.    Pt will benefit from skilled therapeutic intervention in order to improve on the following deficits Decreased activity tolerance;Decreased balance;Decreased mobility;Decreased strength;Decreased range of motion;Difficulty walking;Pain   Rehab Potential Good   PT Frequency 3x / week   PT Duration 8 weeks   PT Treatment/Interventions Therapeutic exercise;Therapeutic activities;Functional mobility training;Gait training;Patient/family education   PT Next Visit Plan Continue with  [place] stim and gt training.         Problem List Patient Active Problem List   Diagnosis Date Noted  . Dysphagia 03/13/2015  . AP (abdominal pain)   . Hematemesis without nausea   . Atrial fibrillation and flutter   . AKI (acute kidney injury)   . Bacteremia   . Essential hypertension   . Septic joint of left knee joint   . Blood poisoning   . Fever 12/22/2014  . History of fractured kneecap 12/22/2014  . Knee fracture, left 12/22/2014  . Sepsis 12/22/2014  . Tachycardia with 141 - 160 beats per minute 12/22/2014  . Diabetes mellitus without complication   . Hypertension   . HLD (hyperlipidemia)   . Depression     Laura Davenport, [place] CLT [phone] 04/06/2015, 3:47 PM  [facility] [address] [place], [place], [address] Phone: [phone]   Fax:  [phone]

## 2015-04-10 DIAGNOSIS — S82002A Unspecified fracture of left patella, initial encounter for closed fracture: Secondary | ICD-10-CM | POA: Diagnosis not present

## 2015-04-11 ENCOUNTER — Ambulatory Visit (HOSPITAL_COMMUNITY): Payer: Medicare Other | Admitting: Physical Therapy

## 2015-04-11 DIAGNOSIS — R262 Difficulty in walking, not elsewhere classified: Secondary | ICD-10-CM | POA: Diagnosis not present

## 2015-04-11 DIAGNOSIS — M25662 Stiffness of left knee, not elsewhere classified: Secondary | ICD-10-CM

## 2015-04-11 DIAGNOSIS — R29898 Other symptoms and signs involving the musculoskeletal system: Secondary | ICD-10-CM | POA: Diagnosis not present

## 2015-04-11 DIAGNOSIS — R2689 Other abnormalities of gait and mobility: Secondary | ICD-10-CM | POA: Diagnosis not present

## 2015-04-11 NOTE — Therapy (Signed)
[place], [place], [address] Phone: [phone]   Fax:  [phone]  Physical Therapy Treatment  Patient Details  Name: Laura Davenport MRN: [id number] Date of Birth: 1931/07/01 Referring Provider:  [name], MD  Encounter Date: 04/11/2015      PT End of Session - 04/11/15 1717    Visit Number 12   Number of Visits 24   Date for PT Re-Evaluation 04/29/15   Authorization - Visit Number 12   Authorization - Number of Visits 20   PT Start Time [id number]   PT Stop Time 1435   PT Time Calculation (min) 52 min   Equipment Utilized During Treatment Gait belt   Activity Tolerance Other (comment)  limited by anxiety    Behavior During Therapy Anxious      Past Medical History  Diagnosis Date  . Type 2 diabetes mellitus   . Essential hypertension   . HLD (hyperlipidemia)   . Depression   . Septic arthritis     MRSA, left knee   . GI bleed     Severe gastritis and duodenal ulcers by EGD May 2016     Past Surgical History  Procedure Laterality Date  . Cholecystectomy N/A 11/04/2012    Procedure: LAPAROSCOPIC CHOLECYSTECTOMY;  Surgeon: Jamesetta So, MD;  [facility];  Service: General;  Laterality: N/A;  . Knee surgery    . I&d extremity Left 12/24/2014    Procedure: IRRIGATION AND DEBRIDEMENT EXTREMITY AND ARTHROSCOPY KNEE;  Surgeon: Renette Butters, MD;  Location: [facility];  Service: Orthopedics;  Laterality: Left;  . Esophagogastroduodenoscopy N/A 01/06/2015    Procedure: ESOPHAGOGASTRODUODENOSCOPY (EGD);  Surgeon: Danie Binder, MD;  [facility];  Service: Endoscopy;  Laterality: N/A;  1130  . Savory dilation  01/06/2015    Procedure: SAVORY DILATION;  Surgeon: Danie Binder, MD;  [facility];  Service: Endoscopy;;    There were no vitals filed for this visit.  Visit Diagnosis:  Knee stiffness, left  Weakness of left leg  Poor balance  Difficulty walking      Subjective  Assessment - 04/11/15 1714    Subjective Patient arrived with JAS brace, asking PT to look at it   Pertinent History HTN, DM    Currently in Pain? No/denies                         [facility] Adult PT Treatment/Exercise - 04/11/15 0001    Ambulation/Gait   Pre-Gait Activities Attempted gait multiple times outside of parallel bars but patient too anxious and fearful; able to ambulate within parallel bars with Mod(A) however too fearful to transition outside of bars    Knee/Hip Exercises: Stretches   Other Knee/Hip Stretches Manual extended stretch of L LE due to significant hamstring tightness/contracture with some soft tissue mobilization of gastroc and hamstrings during stretch    Knee/Hip Exercises: Standing   Other Standing Knee Exercises Sit to stand with Max assist in parallel bars and Max(A) with walker    Other Standing Knee Exercises Stand-pivot transfer with walker and Mod-Max(A)x2                 PT Education - 04/11/15 1716    Education provided Yes   Education Details importance of weight bearing and ambulation in general; cuases of L LE tightness; JAS brace seems ok, should be fine to use as long as it is  not hruting her    Person(s) Educated Patient;Child(ren)   Methods Explanation   Comprehension Verbalized understanding          PT Short Term Goals - 03/07/15 1619    PT SHORT TERM GOAL #1   Title I HEP   Status On-going   PT SHORT TERM GOAL #2   Title PT to be able to transfer with moderate assist    PT SHORT TERM GOAL #3   Title Pt to be able to straighten knee to -15 to allow a more normalized gt.   Status On-going   PT SHORT TERM GOAL #4   Title Pt to be able to walk with RW and min assist x 50 ft.   PT SHORT TERM GOAL #5   Title Pain to be no greater than a 6/10           PT Long Term Goals - 03/07/15 1620    PT LONG TERM GOAL #1   Title Pt to be able to transfer with supervision   PT LONG TERM GOAL #2   Title Pt to be able to  walk with walder and mod I x 10 minutes    PT LONG TERM GOAL #3   Title Pt extension to 5 degrees to allow a more normalized gait.   PT LONG TERM GOAL #4   Title Pt to be able to stand for 20 minutes without the use of the walker to be able to make a small meal    PT LONG TERM GOAL #5   Title Pain to be no greater than a 2/10               Plan - 04/11/15 1718    Clinical Impression Statement Patient continues to be extremetly anxious regarding ambulation in general, also with standing up outside of parallel bars. Attempted multiple times to gait train outside of bars but unable even with 3 people asssting. Able to ambulate inside of bars but very fearful and anxious, unable to transition to walking outside of bars at this time and fearful regarding putting weight down through walker. Able to do stand pivot transfer today with max(A)x2. Static posture and gait definiitely impacted by L LE tightness, whcih also appears to limit how much weight she can comfortable put down through leg as in static stance she is is toe touch due to knee flexion. Educated patient that JAS brace should be OK to wear for now at current settings, will need to be progresseed to assist in reducing L LE tightness.    Pt will benefit from skilled therapeutic intervention in order to improve on the following deficits Decreased activity tolerance;Decreased balance;Decreased mobility;Decreased strength;Decreased range of motion;Difficulty walking;Pain   Rehab Potential Good   PT Frequency 3x / week   PT Duration 8 weeks   PT Treatment/Interventions Therapeutic exercise;Therapeutic activities;Functional mobility training;Gait training;Patient/family education   PT Next Visit Plan Continue with  [place] stim and gt training.    PT Home Exercise Plan given   Consulted and Agree with Plan of Care Patient        Problem List Patient Active Problem List   Diagnosis Date Noted  . Dysphagia 03/13/2015  . AP (abdominal  pain)   . Hematemesis without nausea   . Atrial fibrillation and flutter   . AKI (acute kidney injury)   . Bacteremia   . Essential hypertension   . Septic joint of left knee joint   . Blood poisoning   .  Fever 12/22/2014  . History of fractured kneecap 12/22/2014  . Knee fracture, left 12/22/2014  . Sepsis 12/22/2014  . Tachycardia with 141 - 160 beats per minute 12/22/2014  . Diabetes mellitus without complication   . Hypertension   . HLD (hyperlipidemia)   . Depression     Deniece Ree PT, DPT [id number]  [facility] [address] [place], [place], [address] Phone: [phone]   Fax:  [phone]

## 2015-04-13 ENCOUNTER — Ambulatory Visit (HOSPITAL_COMMUNITY): Payer: Medicare Other

## 2015-04-13 ENCOUNTER — Encounter (HOSPITAL_COMMUNITY): Payer: Self-pay

## 2015-04-13 DIAGNOSIS — R29898 Other symptoms and signs involving the musculoskeletal system: Secondary | ICD-10-CM | POA: Diagnosis not present

## 2015-04-13 DIAGNOSIS — R2689 Other abnormalities of gait and mobility: Secondary | ICD-10-CM

## 2015-04-13 DIAGNOSIS — M25662 Stiffness of left knee, not elsewhere classified: Secondary | ICD-10-CM

## 2015-04-13 DIAGNOSIS — R262 Difficulty in walking, not elsewhere classified: Secondary | ICD-10-CM | POA: Diagnosis not present

## 2015-04-13 NOTE — Therapy (Signed)
[facility], [place], [address] Phone: [phone]   Fax:  [phone]  Physical Therapy Treatment  Patient Details  Name: Laura Davenport MRN: [id number] Date of Birth: [birth year] Referring Provider:  [name], MD  Encounter Date: 04/13/2015      PT End of Session - 04/13/15 1204    Visit Number 13   Number of Visits 24   Date for PT Re-Evaluation 04/29/15   Authorization - Visit Number 13   Authorization - Number of Visits 20   PT Start Time [id number]   PT Stop Time [id number]   PT Time Calculation (min) 40 min   Activity Tolerance Patient tolerated treatment well   Behavior During Therapy Anxious      Past Medical History  Diagnosis Date  . Type 2 diabetes mellitus   . Essential hypertension   . HLD (hyperlipidemia)   . Depression   . Septic arthritis     MRSA, left knee   . GI bleed     Severe gastritis and duodenal ulcers by EGD May 2016     Past Surgical History  Procedure Laterality Date  . Cholecystectomy N/A 11/04/2012    Procedure: LAPAROSCOPIC CHOLECYSTECTOMY;  Surgeon: Jamesetta So, MD;  [facility];  Service: General;  Laterality: N/A;  . Knee surgery    . I&d extremity Left 12/24/2014    Procedure: IRRIGATION AND DEBRIDEMENT EXTREMITY AND ARTHROSCOPY KNEE;  Surgeon: Renette Butters, MD;  Location: [facility];  Service: Orthopedics;  Laterality: Left;  . Esophagogastroduodenoscopy N/A 01/06/2015    Procedure: ESOPHAGOGASTRODUODENOSCOPY (EGD);  Surgeon: Danie Binder, MD;  [facility];  Service: Endoscopy;  Laterality: N/A;  1130  . Savory dilation  01/06/2015    Procedure: SAVORY DILATION;  Surgeon: Danie Binder, MD;  [facility];  Service: Endoscopy;;    There were no vitals filed for this visit.  Visit Diagnosis:  Knee stiffness, left  Weakness of left leg  Poor balance  Difficulty walking      Subjective Assessment - 04/13/15 1151    Subjective Patient  reports she is feeling as good as usual. Reports she has used JAS brace 3x/day for 30 minutes/session and says that it seems to be making a difference, including improved quality of sleep at night.    Pertinent History HTN, DM    Limitations Standing;Walking                         [facility] Adult PT Treatment/Exercise - 04/13/15 0001    Transfers   Transfers Sit to Stand   Sit to Stand 4: Min assist  performed 7x c 40 sec stance; maxA c RW, MinA c parallel bar   Ambulation/Gait   Pre-Gait Activities Weight shifting laterally in //bars  Able to put weight on LLE despite flexion contracture.    Knee/Hip Exercises: Stretches   Passive Hamstring Stretch 5 reps;30 seconds   Other Knee/Hip Stretches Manual extended stretch of L LE due to significant hamstring tightness/contracture with some soft tissue mobilization of gastroc and hamstrings during stretch                 PT Education - 04/13/15 1203    Education provided Yes   Education Details Continue c brace as described, attempt additional WB on LLE while standing and weight shifting.    Person(s) Educated Patient   Methods Explanation;Demonstration   Comprehension  Verbalized understanding;Returned demonstration          PT Short Term Goals - 04/13/15 1210    PT SHORT TERM GOAL #1   Title I HEP   Time 7   Period Days   Status On-going   PT SHORT TERM GOAL #2   Title PT to be able to transfer with moderate assist    Time 2   Period Weeks   PT SHORT TERM GOAL #3   Title Pt to be able to straighten knee to -15 to allow a more normalized gt.   Time 4   Period Weeks   Status On-going   PT SHORT TERM GOAL #4   Title Pt to be able to walk with RW and min assist x 50 ft.   Time 4   Period Weeks   PT SHORT TERM GOAL #5   Title Pain to be no greater than a 6/10   Time 4   Period Weeks           PT Long Term Goals - 04/13/15 1212    PT LONG TERM GOAL #1   Title Pt to be able to transfer with  supervision   Time 8   Period Weeks   PT LONG TERM GOAL #2   Title Pt to be able to walk with walder and mod I x 10 minutes    Time 8   Period Weeks   PT LONG TERM GOAL #3   Title Pt extension to 5 degrees to allow a more normalized gait.   Time 8   Period Weeks   PT LONG TERM GOAL #4   Title Pt to be able to stand for 20 minutes without the use of the walker to be able to make a small meal    Time 8   Period Weeks   PT LONG TERM GOAL #5   Title Pain to be no greater than a 2/10   Time 8   Period Weeks               Plan - 04/13/15 1205    Clinical Impression Statement Pt continues to remain fairly anxious, tearful about falling during attempts to transfer dependent c and s RW. Pt confidence improves c repeated efforts, and once pt is in stance, she is able to remain standing , c most WB on RLE which she says feels good. Pt uttilizes the //bars mostly for pulling up to standing from elevated surface. Using the RW for pushing up to stand proves more dfficult. Pt L knee ROM continues to be quite limiting and painful with knee extension stretches. Pt will continue to benefit from skilled intervention to imrpove strength, ROM, and balance, in order to improve indep funcitonal mobility.    Pt will benefit from skilled therapeutic intervention in order to improve on the following deficits Decreased activity tolerance;Decreased balance;Decreased mobility;Decreased strength;Decreased range of motion;Difficulty walking;Pain   Rehab Potential Good   PT Frequency 3x / week   PT Duration 8 weeks   PT Treatment/Interventions Therapeutic exercise;Therapeutic activities;Functional mobility training;Gait training;Patient/family education   PT Next Visit Plan Continue to improve confidence with standing and transfers. Monitor progress in L knee ROM during use of brace at home.    PT Home Exercise Plan No changes.         Problem List Patient Active Problem List   Diagnosis Date Noted  .  Dysphagia 03/13/2015  . AP (abdominal pain)   . Hematemesis without nausea   .  Atrial fibrillation and flutter   . AKI (acute kidney injury)   . Bacteremia   . Essential hypertension   . Septic joint of left knee joint   . Blood poisoning   . Fever 12/22/2014  . History of fractured kneecap 12/22/2014  . Knee fracture, left 12/22/2014  . Sepsis 12/22/2014  . Tachycardia with 141 - 160 beats per minute 12/22/2014  . Diabetes mellitus without complication   . Hypertension   . HLD (hyperlipidemia)   . Depression     Buccola,Allan C 04/13/2015, 12:22 PM  12:23 PM  Etta Grandchild, PT, DPT [place] License # [id number]       [facility] [address] [place], [place], [address] Phone: [phone]   Fax:  [phone]

## 2015-04-18 ENCOUNTER — Ambulatory Visit (HOSPITAL_COMMUNITY): Payer: Medicare Other | Admitting: Physical Therapy

## 2015-04-18 DIAGNOSIS — M25662 Stiffness of left knee, not elsewhere classified: Secondary | ICD-10-CM | POA: Diagnosis not present

## 2015-04-18 DIAGNOSIS — R29898 Other symptoms and signs involving the musculoskeletal system: Secondary | ICD-10-CM

## 2015-04-18 DIAGNOSIS — R2689 Other abnormalities of gait and mobility: Secondary | ICD-10-CM | POA: Diagnosis not present

## 2015-04-18 DIAGNOSIS — R262 Difficulty in walking, not elsewhere classified: Secondary | ICD-10-CM | POA: Diagnosis not present

## 2015-04-18 NOTE — Therapy (Signed)
[place], [place], [address] Phone: [phone]   Fax:  [phone]  Physical Therapy Treatment  Patient Details  Name: Laura Davenport MRN: [id number] Date of Birth: 03-11-1931 Referring Provider:  [name], MD  Encounter Date: 04/18/2015      PT End of Session - 04/18/15 1006    Visit Number 14   Number of Visits 24   Date for PT Re-Evaluation 04/29/15   Authorization - Visit Number 14   Authorization - Number of Visits 20   PT Start Time 0932   PT Stop Time [id number]   PT Time Calculation (min) 43 min   Activity Tolerance Patient tolerated treatment well   Behavior During Therapy Anxious      Past Medical History  Diagnosis Date  . Type 2 diabetes mellitus   . Essential hypertension   . HLD (hyperlipidemia)   . Depression   . Septic arthritis     MRSA, left knee   . GI bleed     Severe gastritis and duodenal ulcers by EGD May 2016     Past Surgical History  Procedure Laterality Date  . Cholecystectomy N/A 11/04/2012    Procedure: LAPAROSCOPIC CHOLECYSTECTOMY;  Surgeon: Jamesetta So, MD;  [facility];  Service: General;  Laterality: N/A;  . Knee surgery    . I&d extremity Left 12/24/2014    Procedure: IRRIGATION AND DEBRIDEMENT EXTREMITY AND ARTHROSCOPY KNEE;  Surgeon: Renette Butters, MD;  Location: [facility];  Service: Orthopedics;  Laterality: Left;  . Esophagogastroduodenoscopy N/A 01/06/2015    Procedure: ESOPHAGOGASTRODUODENOSCOPY (EGD);  Surgeon: Danie Binder, MD;  [facility];  Service: Endoscopy;  Laterality: N/A;  1130  . Savory dilation  01/06/2015    Procedure: SAVORY DILATION;  Surgeon: Danie Binder, MD;  [facility];  Service: Endoscopy;;    There were no vitals filed for this visit.  Visit Diagnosis:  Knee stiffness, left  Weakness of left leg  Poor balance  Difficulty walking      Subjective Assessment - 04/18/15 1025    Subjective PT states  she's been using her JAS brace 3X day for 30 minutes each.  Pt states her knee is tighter feeling on the top,  not underneath.   Currently in Pain? No/denies            [facility] PT Assessment - 04/18/15 0942    AROM   Left Knee Extension 35   Left Knee Flexion 115   Transfers   Transfers Sit to Stand   Sit to Stand 4: Min assist   Ambulation/Gait   Pre-Gait Activities weight shifting in walker in front of mat                     [facility] Adult PT Treatment/Exercise - 04/18/15 0942    Knee/Hip Exercises: Stretches   Passive Hamstring Stretch 5 reps;30 seconds   Other Knee/Hip Stretches Manual extended stretch of L LE due to significant hamstring tightness/contracture with some soft tissue mobilization of gastroc and hamstrings during stretch    Knee/Hip Exercises: Standing   Other Standing Knee Exercises Stand-pivot transfer with walker and Mod-Max(A)x2    Knee/Hip Exercises: Supine   Short Arc Quad Sets Left;10 reps   Heel Slides 10 reps                  PT Short Term Goals - 04/13/15 1210    PT  SHORT TERM GOAL #1   Title I HEP   Time 7   Period Days   Status On-going   PT SHORT TERM GOAL #2   Title PT to be able to transfer with moderate assist    Time 2   Period Weeks   PT SHORT TERM GOAL #3   Title Pt to be able to straighten knee to -15 to allow a more normalized gt.   Time 4   Period Weeks   Status On-going   PT SHORT TERM GOAL #4   Title Pt to be able to walk with RW and min assist x 50 ft.   Time 4   Period Weeks   PT SHORT TERM GOAL #5   Title Pain to be no greater than a 6/10   Time 4   Period Weeks           PT Long Term Goals - 04/13/15 1212    PT LONG TERM GOAL #1   Title Pt to be able to transfer with supervision   Time 8   Period Weeks   PT LONG TERM GOAL #2   Title Pt to be able to walk with walder and mod I x 10 minutes    Time 8   Period Weeks   PT LONG TERM GOAL #3   Title Pt extension to 5 degrees to allow a  more normalized gait.   Time 8   Period Weeks   PT LONG TERM GOAL #4   Title Pt to be able to stand for 20 minutes without the use of the walker to be able to make a small meal    Time 8   Period Weeks   PT LONG TERM GOAL #5   Title Pain to be no greater than a 2/10   Time 8   Period Weeks               Plan - 04/18/15 1006    Clinical Impression Statement Focused on improving standing posture, weight bearing into Lt LE and ROM of LT knee today.   Lt knee AROM 35-115 degrees today.  Pt stood and remained good standing posture with weight bearing through LT LE on initial try; attempts declined in quality following first attempt.  PT tends to pull up with walker rather than pushing up with UE's.  Fear of pain/falling prevents increased participation or attempting actvitieis out of patients comfort zone. Pt also with poor actvitiy tolerance.      PT Treatment/Interventions Therapeutic exercise;Therapeutic activities;Functional mobility training;Gait training;Patient/family education   PT Next Visit Plan Continue to improve confidence with standing and transfers. Monitor progress in L knee ROM during use of brace at home.   Add nustep next visit to increase activity tolerance and Lt knee mobility.   PT Home Exercise Plan No changes.         Problem List Patient Active Problem List   Diagnosis Date Noted  . Dysphagia 03/13/2015  . AP (abdominal pain)   . Hematemesis without nausea   . Atrial fibrillation and flutter   . AKI (acute kidney injury)   . Bacteremia   . Essential hypertension   . Septic joint of left knee joint   . Blood poisoning   . Fever 12/22/2014  . History of fractured kneecap 12/22/2014  . Knee fracture, left 12/22/2014  . Sepsis 12/22/2014  . Tachycardia with 141 - 160 beats per minute 12/22/2014  . Diabetes mellitus without complication   .  Hypertension   . HLD (hyperlipidemia)   . Depression     Teena Irani, PTA/CLT [phone]  04/18/2015,  10:26 AM  [place], [place], [address] Phone: [phone]   Fax:  [phone]

## 2015-04-19 ENCOUNTER — Ambulatory Visit (INDEPENDENT_AMBULATORY_CARE_PROVIDER_SITE_OTHER): Payer: Medicare Other | Admitting: Cardiology

## 2015-04-19 ENCOUNTER — Encounter: Payer: Self-pay | Admitting: Cardiology

## 2015-04-19 VITALS — BP 120/72 | HR 90 | Ht 65.0 in | Wt 148.0 lb

## 2015-04-19 DIAGNOSIS — I471 Supraventricular tachycardia: Secondary | ICD-10-CM | POA: Diagnosis not present

## 2015-04-19 NOTE — Patient Instructions (Signed)
,  Your physician wants you to follow-up in: 6 months with Dr Ferne Reus will receive a reminder letter in the mail two months in advance. If you don't receive a letter, please call our office to schedule the follow-up appointment.     Your physician recommends that you continue on your current medications as directed. Please refer to the Current Medication list given to you today.     Thank you for choosing [facility] !

## 2015-04-19 NOTE — Progress Notes (Signed)
Cardiology Office Note  Date: 04/19/2015   ID: MAREDITH SLAVICK, DOB [birth year], MRN [id number]  PCP: [name], MD  Primary Cardiologist: Rozann Lesches, MD   Chief Complaint  Patient presents with  . PSVT    History of Present Illness: ERCELLE CONTESSA is an 81 y.o. female last seen in May. She presents for a routine follow-up visit. She does not report any problems with chest pain or palpitations. At this point she continues to work with physical therapy trying to improve her leg strength and flexibility. She is in a wheelchair today.  I reviewed her medications, she remains on Cardizem CD.   Past Medical History  Diagnosis Date  . Type 2 diabetes mellitus   . Essential hypertension   . HLD (hyperlipidemia)   . Depression   . Septic arthritis     MRSA, left knee   . GI bleed     Severe gastritis and duodenal ulcers by EGD May 2016   . PSVT (paroxysmal supraventricular tachycardia)      Current Outpatient Prescriptions  Medication Sig Dispense Refill  . acetaminophen (TYLENOL) 325 MG tablet Take 650 mg by mouth every 6 (six) hours as needed for mild pain or moderate pain.     Marland Kitchen atorvastatin (LIPITOR) 80 MG tablet Take 80 mg by mouth daily at 6 PM.    . diltiazem (CARDIZEM CD) 120 MG 24 hr capsule Take 1 capsule (120 mg total) by mouth daily. 30 capsule 0  . losartan (COZAAR) 100 MG tablet     . ONE TOUCH ULTRA TEST test strip     . ONETOUCH DELICA LANCETS [id number] MISC     . pantoprazole (PROTONIX) 40 MG tablet      No current facility-administered medications for this visit.    Allergies:  Oxycodone   Social History: The patient  reports that she has never smoked. She does not have any smokeless tobacco history on file. She reports that she does not drink alcohol or use illicit drugs.   ROS:  Please see the history of present illness. Otherwise, complete review of systems is positive for leg swelling and stiffness.  All other systems are reviewed and negative.    Physical Exam: VS:  BP 120/72 mmHg  Pulse 90  Ht 5\' 5"  (1.651 m)  Wt 148 lb (67.132 kg)  BMI 24.63 kg/m2  SpO2 99%, BMI Body mass index is 24.63 kg/(m^2).  Wt Readings from Last 3 Encounters:  04/19/15 148 lb (67.132 kg)  01/18/15 201 lb (91.173 kg)  12/30/14 195 lb 8 oz (88.678 kg)     General: Patient in wheelchair, appears comfortable at rest. HEENT: Conjunctiva and lids normal, oropharynx clear with moist mucosa. Neck: Supple, no elevated JVP or carotid bruits, no thyromegaly. Lungs: Clear to auscultation, nonlabored breathing at rest. Cardiac: Regular rate and rhythm, no S3 or significant systolic murmur. Abdomen: Soft, nontender, bowel sounds present. Extremities: Brace on right leg/knee, distal pulses 2+. Skin: Warm and dry. Musculoskeletal: No kyphosis. Neuropsychiatric: Alert and oriented x3, affect grossly appropriate.   ECG: ECG is not ordered today.  Recent Labwork: 12/23/2014: B Natriuretic Peptide 154.3* 12/30/2014: ALT 36*; AST 48*; BUN 35*; Creatinine, Ser 1.44*; Potassium 4.0; Sodium 137 01/03/2015: Hemoglobin 10.1*; Platelets 247     Component Value Date/Time   CHOL 76 12/23/2014 0310   TRIG 30 12/23/2014 0310   HDL 30* 12/23/2014 0310   CHOLHDL 2.5 12/23/2014 0310   VLDL 6 12/23/2014 0310   LDLCALC 40 12/23/2014  0310    ASSESSMENT AND PLAN:  PSVT based on review of previous telemetry strips and ECG. Continue Cardizem CD for now. We will continue to observe for any other rhythm changes that might necessitate a change in medical regimen.  Current medicines were reviewed at length with the patient today.   Disposition: FU with me in 6 months.   Signed, Satira Sark, MD, [facility] 04/19/2015 11:03 AM    [facility]. [address] [place], [place] [address] Phone: [phone]; Fax: [phone]

## 2015-04-20 ENCOUNTER — Ambulatory Visit (HOSPITAL_COMMUNITY): Payer: Medicare Other | Admitting: Physical Therapy

## 2015-04-20 DIAGNOSIS — R262 Difficulty in walking, not elsewhere classified: Secondary | ICD-10-CM

## 2015-04-20 DIAGNOSIS — M25662 Stiffness of left knee, not elsewhere classified: Secondary | ICD-10-CM | POA: Diagnosis not present

## 2015-04-20 DIAGNOSIS — R2689 Other abnormalities of gait and mobility: Secondary | ICD-10-CM | POA: Diagnosis not present

## 2015-04-20 DIAGNOSIS — R29898 Other symptoms and signs involving the musculoskeletal system: Secondary | ICD-10-CM

## 2015-04-20 NOTE — Therapy (Signed)
[place], [place], [address] Phone: [phone]   Fax:  [phone]  Physical Therapy Treatment  Patient Details  Name: Laura Davenport MRN: [id number] Date of Birth: 04/02/1931 Referring Provider:  [name], MD  Encounter Date: 04/20/2015      PT End of Session - 04/20/15 1150    Visit Number 15   Number of Visits 24   Date for PT Re-Evaluation 04/29/15   Authorization Type medicare   Authorization - Visit Number 15   Authorization - Number of Visits 20   PT Start Time [id number]   PT Stop Time 1145   PT Time Calculation (min) 40 min   Activity Tolerance Patient tolerated treatment well   Behavior During Therapy Anxious      Past Medical History  Diagnosis Date  . Type 2 diabetes mellitus   . Essential hypertension   . HLD (hyperlipidemia)   . Depression   . Septic arthritis     MRSA, left knee   . GI bleed     Severe gastritis and duodenal ulcers by EGD May 2016   . PSVT (paroxysmal supraventricular tachycardia)     Past Surgical History  Procedure Laterality Date  . Cholecystectomy N/A 11/04/2012    Procedure: LAPAROSCOPIC CHOLECYSTECTOMY;  Surgeon: Jamesetta So, MD;  [facility];  Service: General;  Laterality: N/A;  . Knee surgery    . I&d extremity Left 12/24/2014    Procedure: IRRIGATION AND DEBRIDEMENT EXTREMITY AND ARTHROSCOPY KNEE;  Surgeon: Renette Butters, MD;  Location: [facility];  Service: Orthopedics;  Laterality: Left;  . Esophagogastroduodenoscopy N/A 01/06/2015    Procedure: ESOPHAGOGASTRODUODENOSCOPY (EGD);  Surgeon: Danie Binder, MD;  [facility];  Service: Endoscopy;  Laterality: N/A;  1130  . Savory dilation  01/06/2015    Procedure: SAVORY DILATION;  Surgeon: Danie Binder, MD;  [facility];  Service: Endoscopy;;    There were no vitals filed for this visit.  Visit Diagnosis:  Knee stiffness, left  Weakness of left leg  Poor balance  Difficulty  walking      Subjective Assessment - 04/20/15 1149    Subjective Pt comes today with HR's on wheelchair and without foot plates.  States she's been working her leg as much as she can.   Currently in Pain? No/denies                         [facility] Adult PT Treatment/Exercise - 04/20/15 1119    Transfers   Transfers Sit to Stand   Sit to Stand 4: Min assist   Ambulation/Gait   Ambulation/Gait No   Pre-Gait Activities sit to stand from wheelchair in parallel bars; transfer safety, tolerance and weight shifting to Lt LE.   Knee/Hip Exercises: Stretches   Passive Hamstring Stretch 5 reps;30 seconds   Other Knee/Hip Stretches extended stretch of L LE seated with Lt propped on 14" step/5# weight over knee    Manual Therapy   Manual Therapy Joint mobilization   Joint Mobilization to increase knee extension                  PT Short Term Goals - 04/13/15 1210    PT SHORT TERM GOAL #1   Title I HEP   Time 7   Period Days   Status On-going   PT SHORT TERM GOAL #2   Title PT to be able  to transfer with moderate assist    Time 2   Period Weeks   PT SHORT TERM GOAL #3   Title Pt to be able to straighten knee to -15 to allow a more normalized gt.   Time 4   Period Weeks   Status On-going   PT SHORT TERM GOAL #4   Title Pt to be able to walk with RW and min assist x 50 ft.   Time 4   Period Weeks   PT SHORT TERM GOAL #5   Title Pain to be no greater than a 6/10   Time 4   Period Weeks           PT Long Term Goals - 04/13/15 1212    PT LONG TERM GOAL #1   Title Pt to be able to transfer with supervision   Time 8   Period Weeks   PT LONG TERM GOAL #2   Title Pt to be able to walk with walder and mod I x 10 minutes    Time 8   Period Weeks   PT LONG TERM GOAL #3   Title Pt extension to 5 degrees to allow a more normalized gait.   Time 8   Period Weeks   PT LONG TERM GOAL #4   Title Pt to be able to stand for 20 minutes without the use of the  walker to be able to make a small meal    Time 8   Period Weeks   PT LONG TERM GOAL #5   Title Pain to be no greater than a 2/10   Time 8   Period Weeks               Plan - 04/20/15 1150    Clinical Impression Statement Unable to add nustep this session due to time contraints.  Appears patient's fear is major limitation at this point.  Spoke with patient regarding this, encouraging her to trust her and assured her of her safety.  PT also tends to use her UE's to lift her Lt LE instead of utilizing her muscles.  PT requires alot of encouragement.  Worked on transfer safety, outlining the steps standing to/from wheelchair.       Rehab Potential Good   PT Treatment/Interventions --   PT Next Visit Plan Continue to improve confidence with standing and transfers. Monitor progress in L knee ROM during use of brace at home.   Add nustep next visit to increase activity tolerance and Lt knee mobility.   PT Home Exercise Plan --        Problem List Patient Active Problem List   Diagnosis Date Noted  . Dysphagia 03/13/2015  . AP (abdominal pain)   . Hematemesis without nausea   . Atrial fibrillation and flutter   . AKI (acute kidney injury)   . Bacteremia   . Essential hypertension   . Septic joint of left knee joint   . Blood poisoning   . Fever 12/22/2014  . History of fractured kneecap 12/22/2014  . Knee fracture, left 12/22/2014  . Sepsis 12/22/2014  . Tachycardia with 141 - 160 beats per minute 12/22/2014  . Diabetes mellitus without complication   . Hypertension   . HLD (hyperlipidemia)   . Depression     Teena Irani, PTA/CLT [phone]  04/20/2015, 1:06 PM  [facility] [address] [place], [place], [address] Phone: [phone]   Fax:  [phone]

## 2015-04-25 ENCOUNTER — Ambulatory Visit (HOSPITAL_COMMUNITY): Payer: Medicare Other | Admitting: Physical Therapy

## 2015-04-25 DIAGNOSIS — M25662 Stiffness of left knee, not elsewhere classified: Secondary | ICD-10-CM | POA: Diagnosis not present

## 2015-04-25 DIAGNOSIS — R262 Difficulty in walking, not elsewhere classified: Secondary | ICD-10-CM

## 2015-04-25 DIAGNOSIS — R29898 Other symptoms and signs involving the musculoskeletal system: Secondary | ICD-10-CM | POA: Diagnosis not present

## 2015-04-25 DIAGNOSIS — R2689 Other abnormalities of gait and mobility: Secondary | ICD-10-CM | POA: Diagnosis not present

## 2015-04-25 NOTE — Therapy (Signed)
[place], [place], [address] Phone: [phone]   Fax:  [phone]  Physical Therapy Treatment  Patient Details  Name: Laura Davenport MRN: [id number] Date of Birth: 1931/01/10 Referring Provider:  [name], MD  Encounter Date: 04/25/2015      PT End of Session - 04/25/15 1155    Visit Number 16   Number of Visits 24   Date for PT Re-Evaluation 04/29/15   Authorization Type medicare   Authorization - Visit Number 16   Authorization - Number of Visits 20   PT Start Time 1100   PT Stop Time 1150   PT Time Calculation (min) 50 min   Activity Tolerance Patient tolerated treatment well   Behavior During Therapy Anxious      Past Medical History  Diagnosis Date  . Type 2 diabetes mellitus   . Essential hypertension   . HLD (hyperlipidemia)   . Depression   . Septic arthritis     MRSA, left knee   . GI bleed     Severe gastritis and duodenal ulcers by EGD May 2016   . PSVT (paroxysmal supraventricular tachycardia)     Past Surgical History  Procedure Laterality Date  . Cholecystectomy N/A 11/04/2012    Procedure: LAPAROSCOPIC CHOLECYSTECTOMY;  Surgeon: Jamesetta So, MD;  [facility];  Service: General;  Laterality: N/A;  . Knee surgery    . I&d extremity Left 12/24/2014    Procedure: IRRIGATION AND DEBRIDEMENT EXTREMITY AND ARTHROSCOPY KNEE;  Surgeon: Renette Butters, MD;  Location: [facility];  Service: Orthopedics;  Laterality: Left;  . Esophagogastroduodenoscopy N/A 01/06/2015    Procedure: ESOPHAGOGASTRODUODENOSCOPY (EGD);  Surgeon: Danie Binder, MD;  [facility];  Service: Endoscopy;  Laterality: N/A;  1130  . Savory dilation  01/06/2015    Procedure: SAVORY DILATION;  Surgeon: Danie Binder, MD;  [facility];  Service: Endoscopy;;    There were no vitals filed for this visit.  Visit Diagnosis:  Knee stiffness, left  Weakness of left leg  Poor balance  Difficulty  walking      Subjective Assessment - 04/25/15 1102    Subjective Pt states she had pain early this morning but took a tylenol and got rid of it.  States her LT knee hurts more at night than any other time.   Pt states she 's been doing her HEP and her daughter brought over a portable step machine.   Currently in Pain? No/denies            [facility] PT Assessment - 04/25/15 0001    AROM   Left Knee Extension 25                     [facility] Adult PT Treatment/Exercise - 04/25/15 1112    Knee/Hip Exercises: Stretches   Other Knee/Hip Stretches extended stretch of L LE seated with Lt propped on 14" step/5# weight over knee    Knee/Hip Exercises: Standing   Gait Training in parallel bars 1RT   Knee/Hip Exercises: Seated   Long Arc Quad Left;10 reps;AAROM   Manual Therapy   Manual Therapy Myofascial release;Joint mobilization   Joint Mobilization to increase knee extension   Myofascial Release to decrease scar tissue and adhesions                  PT Short Term Goals - 04/13/15 1210    PT SHORT TERM GOAL #  1   Title I HEP   Time 7   Period Days   Status On-going   PT SHORT TERM GOAL #2   Title PT to be able to transfer with moderate assist    Time 2   Period Weeks   PT SHORT TERM GOAL #3   Title Pt to be able to straighten knee to -15 to allow a more normalized gt.   Time 4   Period Weeks   Status On-going   PT SHORT TERM GOAL #4   Title Pt to be able to walk with RW and min assist x 50 ft.   Time 4   Period Weeks   PT SHORT TERM GOAL #5   Title Pain to be no greater than a 6/10   Time 4   Period Weeks           PT Long Term Goals - 04/13/15 1212    PT LONG TERM GOAL #1   Title Pt to be able to transfer with supervision   Time 8   Period Weeks   PT LONG TERM GOAL #2   Title Pt to be able to walk with walder and mod I x 10 minutes    Time 8   Period Weeks   PT LONG TERM GOAL #3   Title Pt extension to 5 degrees to allow a more normalized  gait.   Time 8   Period Weeks   PT LONG TERM GOAL #4   Title Pt to be able to stand for 20 minutes without the use of the walker to be able to make a small meal    Time 8   Period Weeks   PT LONG TERM GOAL #5   Title Pain to be no greater than a 2/10   Time 8   Period Weeks               Plan - 04/25/15 1155    Clinical Impression Statement Pt able to complete ambulation length of parallel bars X 2 with rest between. Pt uses UE's rather than shifting weight through LT LE and drags Rt LE through.  continued work on transfer safety with patient able to complete all steps except initially locking brakes on wheelchair.    Fear and Lt knee discomfort continues to limit progress.   MFR completed with most restictions around top and inferior aspect of knee.  ROM measured at 25 degrees following manual.     PT Next Visit Plan Continue to improve confidence with standing and transfers. Monitor progress in L knee ROM during use of brace at home.   Add nustep next visit to increase activity tolerance and Lt knee mobility.        Problem List Patient Active Problem List   Diagnosis Date Noted  . Dysphagia 03/13/2015  . AP (abdominal pain)   . Hematemesis without nausea   . Atrial fibrillation and flutter   . AKI (acute kidney injury)   . Bacteremia   . Essential hypertension   . Septic joint of left knee joint   . Blood poisoning   . Fever 12/22/2014  . History of fractured kneecap 12/22/2014  . Knee fracture, left 12/22/2014  . Sepsis 12/22/2014  . Tachycardia with 141 - 160 beats per minute 12/22/2014  . Diabetes mellitus without complication   . Hypertension   . HLD (hyperlipidemia)   . Depression     Teena Irani, PTA/CLT [phone]  04/25/2015, 12:01 PM  [place], [place], [address] Phone: [phone]   Fax:  [phone]

## 2015-04-27 ENCOUNTER — Ambulatory Visit (HOSPITAL_COMMUNITY): Payer: Medicare Other | Admitting: Physical Therapy

## 2015-04-27 DIAGNOSIS — R2689 Other abnormalities of gait and mobility: Secondary | ICD-10-CM

## 2015-04-27 DIAGNOSIS — R29898 Other symptoms and signs involving the musculoskeletal system: Secondary | ICD-10-CM | POA: Diagnosis not present

## 2015-04-27 DIAGNOSIS — M25662 Stiffness of left knee, not elsewhere classified: Secondary | ICD-10-CM

## 2015-04-27 DIAGNOSIS — R262 Difficulty in walking, not elsewhere classified: Secondary | ICD-10-CM | POA: Diagnosis not present

## 2015-04-27 NOTE — Therapy (Addendum)
[place], [place], [address] Phone: [phone]   Fax:  [phone]  Physical Therapy Treatment  Patient Details  Name: Laura Davenport MRN: [id number] Date of Birth: Jul 26, 1931 Referring Provider:  [name], MD  Encounter Date: 04/27/2015      PT End of Session - 04/27/15 1156    Visit Number 17   Number of Visits 24   Date for PT Re-Evaluation 04/29/15   Authorization Type medicare   Authorization - Visit Number 17   Authorization - Number of Visits 20   PT Start Time 1104   PT Stop Time 1155   PT Time Calculation (min) 51 min   Equipment Utilized During Treatment Gait belt   Activity Tolerance Patient tolerated treatment well   Behavior During Therapy Anxious      Past Medical History  Diagnosis Date  . Type 2 diabetes mellitus   . Essential hypertension   . HLD (hyperlipidemia)   . Depression   . Septic arthritis     MRSA, left knee   . GI bleed     Severe gastritis and duodenal ulcers by EGD May 2016   . PSVT (paroxysmal supraventricular tachycardia)     Past Surgical History  Procedure Laterality Date  . Cholecystectomy N/A 11/04/2012    Procedure: LAPAROSCOPIC CHOLECYSTECTOMY;  Surgeon: Jamesetta So, MD;  [facility];  Service: General;  Laterality: N/A;  . Knee surgery    . I&d extremity Left 12/24/2014    Procedure: IRRIGATION AND DEBRIDEMENT EXTREMITY AND ARTHROSCOPY KNEE;  Surgeon: Renette Butters, MD;  Location: [facility];  Service: Orthopedics;  Laterality: Left;  . Esophagogastroduodenoscopy N/A 01/06/2015    Procedure: ESOPHAGOGASTRODUODENOSCOPY (EGD);  Surgeon: Danie Binder, MD;  [facility];  Service: Endoscopy;  Laterality: N/A;  1130  . Savory dilation  01/06/2015    Procedure: SAVORY DILATION;  Surgeon: Danie Binder, MD;  [facility];  Service: Endoscopy;;    There were no vitals filed for this visit.  Visit Diagnosis:  Knee stiffness,  left  Weakness of left leg  Poor balance  Difficulty walking      Subjective Assessment - 04/27/15 1153    Subjective Pt states shes been working her legs alot.  STates her knee still feels stiff.   Currently in Pain? No/denies                         [facility] Adult PT Treatment/Exercise - 04/27/15 0001    Transfers   Transfers Sit to Stand   Sit to Stand 4: Min assist   Knee/Hip Exercises: Aerobic   Nustep 8 minutes level 2 hills #2 LE only   Knee/Hip Exercises: Standing   Gait Training in parallel bars 2X   Other Standing Knee Exercises Stand-pivot transfer with walker and ModA)                  PT Short Term Goals - 04/13/15 1210    PT SHORT TERM GOAL #1   Title I HEP   Time 7   Period Days   Status On-going   PT SHORT TERM GOAL #2   Title PT to be able to transfer with moderate assist    Time 2   Period Weeks   PT SHORT TERM GOAL #3   Title Pt to be able to straighten knee to -15 to allow a more normalized gt.  Time 4   Period Weeks   Status On-going   PT SHORT TERM GOAL #4   Title Pt to be able to walk with RW and min assist x 50 ft.   Time 4   Period Weeks   PT SHORT TERM GOAL #5   Title Pain to be no greater than a 6/10   Time 4   Period Weeks           PT Long Term Goals - 04/13/15 1212    PT LONG TERM GOAL #1   Title Pt to be able to transfer with supervision   Time 8   Period Weeks   PT LONG TERM GOAL #2   Title Pt to be able to walk with walder and mod I x 10 minutes    Time 8   Period Weeks   PT LONG TERM GOAL #3   Title Pt extension to 5 degrees to allow a more normalized gait.   Time 8   Period Weeks   PT LONG TERM GOAL #4   Title Pt to be able to stand for 20 minutes without the use of the walker to be able to make a small meal    Time 8   Period Weeks   PT LONG TERM GOAL #5   Title Pain to be no greater than a 2/10   Time 8   Period Weeks               Plan - 04/27/15 1157    Clinical  Impression Statement Pt with difficulty advancing Rt LE initially in parallel bars.  Pt with inability to increase weight load through LT LE, thus unable to advance Rt LE.  Pt with minimal progress as her fear of falling and pain tolerance with stretching/manual with Lt knee limiting progression.  Pt requires max encouragement throughout treatment.  Mod assist with stand pivot transfer today.  Began nustep with AAROM to begin task as patient unable to extend Lt pedal.      PT Next Visit Plan Continue to improve confidence with standing and transfers. Monitor progress in L knee ROM during use of brace at home.  Re-evaluate next session (4 weeks)        Problem List Patient Active Problem List   Diagnosis Date Noted  . Dysphagia 03/13/2015  . AP (abdominal pain)   . Hematemesis without nausea   . Atrial fibrillation and flutter   . AKI (acute kidney injury)   . Bacteremia   . Essential hypertension   . Septic joint of left knee joint   . Blood poisoning   . Fever 12/22/2014  . History of fractured kneecap 12/22/2014  . Knee fracture, left 12/22/2014  . Sepsis 12/22/2014  . Tachycardia with 141 - 160 beats per minute 12/22/2014  . Diabetes mellitus without complication   . Hypertension   . HLD (hyperlipidemia)   . Depression     Teena Irani, PTA/CLT [phone]  04/27/2015, 12:09 PM  [facility] [address] [place], [place], [address] Phone: [phone]   Fax:  [phone]

## 2015-04-29 DIAGNOSIS — S42301A Unspecified fracture of shaft of humerus, right arm, initial encounter for closed fracture: Secondary | ICD-10-CM | POA: Diagnosis not present

## 2015-04-29 DIAGNOSIS — S82002A Unspecified fracture of left patella, initial encounter for closed fracture: Secondary | ICD-10-CM | POA: Diagnosis not present

## 2015-05-02 ENCOUNTER — Ambulatory Visit (HOSPITAL_COMMUNITY): Payer: Medicare Other | Admitting: Physical Therapy

## 2015-05-02 DIAGNOSIS — R2689 Other abnormalities of gait and mobility: Secondary | ICD-10-CM | POA: Diagnosis not present

## 2015-05-02 DIAGNOSIS — R29898 Other symptoms and signs involving the musculoskeletal system: Secondary | ICD-10-CM | POA: Diagnosis not present

## 2015-05-02 DIAGNOSIS — R262 Difficulty in walking, not elsewhere classified: Secondary | ICD-10-CM | POA: Diagnosis not present

## 2015-05-02 DIAGNOSIS — M25662 Stiffness of left knee, not elsewhere classified: Secondary | ICD-10-CM | POA: Diagnosis not present

## 2015-05-02 NOTE — Patient Instructions (Signed)
Adduction With Resistance   Spread legs and place a pillow  on inside of thighs. Squeeze legs together  for __5_ seconds. Repeat __10_ times. Do __3_ sessions per day. Note: If possible, place feet on floor.  Copyright  VHI. All rights reserved.  Triceps Dip   Feet on floor, hands on armrests, extend arms to lift buttocks from chair. Hold 3___ seconds. Repeat _10__ times. Do _3__ sessions per day.  Copyright  VHI. All rights reserved.  Bridging   Slowly raise buttocks from floor, keeping stomach tight. Repeat __10__ times per set. Do _1___ sets per session. Do __2__ sessions per day.  http://orth.exer.us/1096   Copyright  VHI. All rights reserved.

## 2015-05-02 NOTE — Therapy (Signed)
[facility] [place], [place], [address] Phone: [phone]   Fax:  [phone]  Physical Therapy Treatment  Patient Details  Name: Laura Davenport MRN: [id number] Date of Birth: [birth year] Referring Provider:  [name], MD  Encounter Date: 05/02/2015      PT End of Session - 05/02/15 1202    Visit Number 18   Number of Visits 18   Authorization Type medicare   Authorization - Visit Number 18   Authorization - Number of Visits 18   PT Start Time 9373   PT Stop Time 1145   PT Time Calculation (min) 43 min   Equipment Utilized During Treatment Gait belt   Activity Tolerance Other (comment)  limited due to fear.   Behavior During Therapy Anxious      Past Medical History  Diagnosis Date  . Type 2 diabetes mellitus   . Essential hypertension   . HLD (hyperlipidemia)   . Depression   . Septic arthritis     MRSA, left knee   . GI bleed     Severe gastritis and duodenal ulcers by EGD May 2016   . PSVT (paroxysmal supraventricular tachycardia)     Past Surgical History  Procedure Laterality Date  . Cholecystectomy N/A 11/04/2012    Procedure: LAPAROSCOPIC CHOLECYSTECTOMY;  Surgeon: Jamesetta So, MD;  Location: [facility];  Service: General;  Laterality: N/A;  . Knee surgery    . I&d extremity Left 12/24/2014    Procedure: IRRIGATION AND DEBRIDEMENT EXTREMITY AND ARTHROSCOPY KNEE;  Surgeon: Renette Butters, MD;  Location: [facility];  Service: Orthopedics;  Laterality: Left;  . Esophagogastroduodenoscopy N/A 01/06/2015    Procedure: ESOPHAGOGASTRODUODENOSCOPY (EGD);  Surgeon: Danie Binder, MD;  [facility];  Service: Endoscopy;  Laterality: N/A;  1130  . Savory dilation  01/06/2015    Procedure: SAVORY DILATION;  Surgeon: Danie Binder, MD;  [facility];  Service: Endoscopy;;    There were no vitals filed for this visit.  Visit Diagnosis:  Knee stiffness, left  Weakness of left leg  Poor  balance  Difficulty walking      Subjective Assessment - 05/02/15 1102    Subjective No complaints             [facility] PT Assessment - 05/02/15 0001    Assessment   Medical Diagnosis Lt patella fx    Onset Date/Surgical Date --  11/18/2014   Next MD Visit --  unknown   Prior Therapy SNF   Precautions   Precautions Fall   Restrictions   Weight Bearing Restrictions No   Home Environment   Living Environment Private residence   Living Arrangements --  lives with daughter    Type of [place] to enter  ramps in the back; stairs in front   [place] One level   Prior Function   Level of Independence Independent   Vocation Retired   Associate Professor   Overall Cognitive Status Within Functional Limits for tasks assessed   Observation/Other Assessments   Focus on Therapeutic Outcomes (FOTO)  26  was 26   AROM   Left Knee Extension 35  was 34   Left Knee Flexion 120  was 110   Strength   Left Hip Flexion 2+/5  was 2+/5   Left Hip Extension 3-/5   Left Hip ABduction 2+/5  was 3/5    Left Knee Flexion 2/5  Left Knee Extension 2-/5  was 2-/5   Left Ankle Dorsiflexion 3+/5   Left Ankle Plantar Flexion 2-/5   Transfers   Transfers Sit to Stand   Sit to Stand 2: Max assist  was Max assist. At elevated height pt is able with supervisi   Ambulation/Gait   Ambulation/Gait No   Ambulation/Gait Assistance 3: Mod assist   Assistive device Rolling walker                     [facility] Adult PT Treatment/Exercise - 05/02/15 0001    Ambulation/Gait   Ambulation Distance (Feet) 12 Feet  in parallel bar. x2     Tricep dips x 10 Hip adduction x 10            PT Education - 05/02/15 1202    Education provided Yes   Education Details for HEP; as well as the importance of controlling her fear and how her fear of falling is prohibiting any improvement.    Person(s) Educated Patient   Methods Explanation;Demonstration;Handout    Comprehension Verbalized understanding          PT Short Term Goals - 05/02/15 1206    PT SHORT TERM GOAL #1   Title I HEP   Time 7   Period Weeks   Status On-going   PT SHORT TERM GOAL #2   Title PT to be able to transfer with moderate assist    Time 2   Period Weeks   Status On-going   PT SHORT TERM GOAL #3   Title Pt to be able to straighten knee to -15 to allow a more normalized gt.   Time 4   Period Weeks   Status Not Met   PT SHORT TERM GOAL #4   Title Pt to be able to walk with RW and min assist x 50 ft.   Time 4   Period Weeks   Status Not Met   PT SHORT TERM GOAL #5   Title Pain to be no greater than a 6/10   Time 4   Period Weeks   Status Not Met           PT Long Term Goals - 05/02/15 1207    PT LONG TERM GOAL #1   Title Pt to be able to transfer with supervision   Time 8   Period Weeks   Status Achieved   PT LONG TERM GOAL #2   Title Pt to be able to walk with walder and mod I x 10 minutes    Time 8   Period Weeks   Status Not Met   PT LONG TERM GOAL #3   Title Pt extension to 5 degrees to allow a more normalized gait.   Time 8   Period Weeks   Status Not Met   PT LONG TERM GOAL #4   Title Pt to be able to stand for 20 minutes without the use of the walker to be able to make a small meal    Time 8   Period Weeks   PT LONG TERM GOAL #5   Title Pain to be no greater than a 2/10   Time 8   Period Weeks   Status Not Met               Plan - 05/02/15 1204    Clinical Impression Statement Pt continues to be very fearful of any activity outside of the parallel bars.  Pt states  that it is too painful to exercise her leg.  Reassessment shows no improvement therefore we will discharge at this time.    PT Next Visit Plan discharge pt to HEP.            G-Codes - 05-05-2015 10/05/1206    Functional Assessment Tool Used foto   Functional Limitation Mobility: Walking and moving around   Mobility: Walking and Moving Around Goal Status [phone])  At least 40 percent but less than 60 percent impaired, limited or restricted   Mobility: Walking and Moving Around Discharge Status [phone]) At least 60 percent but less than 80 percent impaired, limited or restricted      Problem List Patient Active Problem List   Diagnosis Date Noted  . Dysphagia 03/13/2015  . AP (abdominal pain)   . Hematemesis without nausea   . Atrial fibrillation and flutter   . AKI (acute kidney injury)   . Bacteremia   . Essential hypertension   . Septic joint of left knee joint   . Blood poisoning   . Fever 12/22/2014  . History of fractured kneecap 12/22/2014  . Knee fracture, left 12/22/2014  . Sepsis 12/22/2014  . Tachycardia with 141 - 160 beats per minute 12/22/2014  . Diabetes mellitus without complication   . Hypertension   . HLD (hyperlipidemia)   . [place], [place] [phone] 05/05/15, 12:10 PM  [facility] [address] [place], [place], [address] Phone: [phone]   Fax:  [phone]   PHYSICAL THERAPY DISCHARGE SUMMARY  Visits from Start of Care: 18  Current functional level related to goals / functional outcomes: same   Remaining deficits: same   Education / Equipment: HEP   Plan: Patient agrees to discharge.  Patient goals were not met. Patient is being discharged due to lack of progress.  ?????   .   Rayetta Humphrey, [place] CLT [phone]

## 2015-05-03 DIAGNOSIS — E785 Hyperlipidemia, unspecified: Secondary | ICD-10-CM | POA: Diagnosis not present

## 2015-05-03 DIAGNOSIS — Z79899 Other long term (current) drug therapy: Secondary | ICD-10-CM | POA: Diagnosis not present

## 2015-05-03 DIAGNOSIS — E119 Type 2 diabetes mellitus without complications: Secondary | ICD-10-CM | POA: Diagnosis not present

## 2015-05-03 DIAGNOSIS — I10 Essential (primary) hypertension: Secondary | ICD-10-CM | POA: Diagnosis not present

## 2015-05-09 ENCOUNTER — Encounter (HOSPITAL_COMMUNITY)
Admission: RE | Admit: 2015-05-09 | Discharge: 2015-05-09 | Disposition: A | Discharge status: Home / Self Care | Payer: Medicare Other | Source: Ambulatory Visit | Attending: Internal Medicine | Admitting: Internal Medicine

## 2015-05-09 ENCOUNTER — Encounter: Payer: Self-pay | Admitting: Nurse Practitioner

## 2015-05-09 DIAGNOSIS — D649 Anemia, unspecified: Secondary | ICD-10-CM | POA: Diagnosis present

## 2015-05-09 LAB — PREPARE RBC (CROSSMATCH)

## 2015-05-09 LAB — HEMOGLOBIN AND HEMATOCRIT, BLOOD
HCT: 15 % — ABNORMAL LOW (ref 36.0–46.0)
Hemoglobin: 6.8 g/dL — CL (ref 12.0–15.0)

## 2015-05-09 LAB — ABO/RH: ABO/RH(D): A POS

## 2015-05-09 NOTE — Progress Notes (Signed)
Results for NORABELLE, SALES (MRN [id number]) as of 05/09/2015 14:30  Ref. Range 05/09/2015 10:34  Hemoglobin Latest Ref Range: 12.0-15.0 g/dL 6.8 (LL)  HCT Latest Ref Range: 36.0-46.0 % 15.0 (L)  Sample Expiration Unknown   Antibody Screen Unknown   ABO/RH(D) Unknown A POS  Pt scheduled in AM at 0900 for PRBC transfusion x2 units. R Yaziel Brandon RN

## 2015-05-09 NOTE — Progress Notes (Signed)
Results for Laura Davenport, Laura Davenport (MRN [id number]) as of 05/09/2015 14:30  Ref. Range 05/09/2015 10:34  Hemoglobin Latest Ref Range: 12.0-15.0 g/dL 6.8 (LL)  HCT Latest Ref Range: 36.0-46.0 % 15.0 (L)  Sample Expiration Unknown   Antibody Screen Unknown   ABO/RH(D) Unknown A POS

## 2015-05-10 ENCOUNTER — Encounter (HOSPITAL_COMMUNITY)
Admission: RE | Admit: 2015-05-10 | Discharge: 2015-05-10 | Disposition: A | Discharge status: Home / Self Care | Payer: Medicare Other | Source: Ambulatory Visit | Attending: Internal Medicine | Admitting: Internal Medicine

## 2015-05-10 ENCOUNTER — Encounter (HOSPITAL_COMMUNITY): Payer: Self-pay

## 2015-05-10 DIAGNOSIS — D649 Anemia, unspecified: Secondary | ICD-10-CM | POA: Diagnosis not present

## 2015-05-10 LAB — HEMOGLOBIN AND HEMATOCRIT, BLOOD
HCT: 30.4 % — ABNORMAL LOW (ref 36.0–46.0)
Hemoglobin: 10.7 g/dL — ABNORMAL LOW (ref 12.0–15.0)

## 2015-05-10 MED ORDER — SODIUM CHLORIDE 0.9 % IV SOLN
Freq: Once | INTRAVENOUS | Status: AC
  Administered 2015-05-10: 250 mL via INTRAVENOUS

## 2015-05-10 MED ORDER — SODIUM CHLORIDE 0.9 % IV SOLN: Freq: Once | INTRAVENOUS | Status: DC

## 2015-05-10 NOTE — Progress Notes (Signed)
Results for Laura Davenport, Laura Davenport (MRN [id number]) as of 05/10/2015 16:02  H&H post transfusion 9/7/20016. Tolerated 2 units of PC well.   Ref. Range 05/10/2015 13:00  Hemoglobin Latest Ref Range: 12.0-15.0 g/dL 10.7 (L)  HCT Latest Ref Range: 36.0-46.0 % 30.4 (L)

## 2015-05-10 NOTE — Discharge Instructions (Signed)

## 2015-05-11 DIAGNOSIS — I10 Essential (primary) hypertension: Secondary | ICD-10-CM | POA: Diagnosis not present

## 2015-05-11 DIAGNOSIS — E46 Unspecified protein-calorie malnutrition: Secondary | ICD-10-CM | POA: Diagnosis not present

## 2015-05-11 LAB — TYPE AND SCREEN
ABO/RH(D): A POS
Antibody Screen: NEGATIVE
Unit division: 0
Unit division: 0
Unit division: 0
Unit division: 0

## 2015-05-24 ENCOUNTER — Telehealth (HOSPITAL_COMMUNITY): Payer: Self-pay | Admitting: Physical Therapy

## 2015-05-24 NOTE — Telephone Encounter (Signed)
Spoke to one of patient's daughters, who was wondering if patient should still be wearing JAS brace; advised the daughter that patient should continue to wear it 3x/day for approximately 30-45 minutes per JAS protocol as long as the brace is not causing any skin breakdown or sores. Daughter reported that there is no skin breakdown secondary to brace wear and they will resume putting it on daily, reported that patient finds brace fairly comfortable overall.  Deniece Ree PT, DPT [phone]

## 2015-05-30 DIAGNOSIS — S42301A Unspecified fracture of shaft of humerus, right arm, initial encounter for closed fracture: Secondary | ICD-10-CM | POA: Diagnosis not present

## 2015-05-30 DIAGNOSIS — S82002A Unspecified fracture of left patella, initial encounter for closed fracture: Secondary | ICD-10-CM | POA: Diagnosis not present

## 2015-06-06 DIAGNOSIS — E785 Hyperlipidemia, unspecified: Secondary | ICD-10-CM | POA: Diagnosis not present

## 2015-06-06 DIAGNOSIS — I10 Essential (primary) hypertension: Secondary | ICD-10-CM | POA: Diagnosis not present

## 2015-06-06 DIAGNOSIS — Z79899 Other long term (current) drug therapy: Secondary | ICD-10-CM | POA: Diagnosis not present

## 2015-06-06 DIAGNOSIS — E119 Type 2 diabetes mellitus without complications: Secondary | ICD-10-CM | POA: Diagnosis not present

## 2015-06-13 DIAGNOSIS — D539 Nutritional anemia, unspecified: Secondary | ICD-10-CM | POA: Diagnosis not present

## 2015-06-13 DIAGNOSIS — Z23 Encounter for immunization: Secondary | ICD-10-CM | POA: Diagnosis not present

## 2015-06-13 DIAGNOSIS — E46 Unspecified protein-calorie malnutrition: Secondary | ICD-10-CM | POA: Diagnosis not present

## 2015-06-13 DIAGNOSIS — E119 Type 2 diabetes mellitus without complications: Secondary | ICD-10-CM | POA: Diagnosis not present

## 2015-06-13 DIAGNOSIS — I10 Essential (primary) hypertension: Secondary | ICD-10-CM | POA: Diagnosis not present

## 2015-06-20 ENCOUNTER — Ambulatory Visit: Payer: Self-pay | Admitting: Nurse Practitioner

## 2015-06-21 ENCOUNTER — Encounter: Payer: Self-pay | Admitting: Nurse Practitioner

## 2015-06-21 ENCOUNTER — Ambulatory Visit (INDEPENDENT_AMBULATORY_CARE_PROVIDER_SITE_OTHER): Payer: Medicare Other | Admitting: Nurse Practitioner

## 2015-06-21 VITALS — BP 105/68 | HR 118 | Temp 98.3°F | Ht 67.0 in

## 2015-06-21 DIAGNOSIS — R1310 Dysphagia, unspecified: Secondary | ICD-10-CM | POA: Diagnosis not present

## 2015-06-21 NOTE — Patient Instructions (Signed)
1. Continue following her precautions as you have including chewing adequately, eating slowly, eating smaller meals, eating softer foods. 2. Continue to explore foods that you enjoy that you're able to tolerate. 3. Return for follow-up in 6 months.

## 2015-06-21 NOTE — Progress Notes (Signed)
cc'ed to pcp °

## 2015-06-21 NOTE — Progress Notes (Signed)
Referring Provider: [name], MD Primary Care Physician:  [name], MD Primary GI:  Dr. Oneida Alar  Chief Complaint  Patient presents with  . Follow-up    HPI:   81 year old female presents for follow-up on dysphagia. Last seen in our office 03/13/2015 with persistent dysphagia symptoms. At that time it was determined that some of her symptoms could be because of erosions and was recently started on a PPI status post esophageal dilation. She and her family were working to eliminate foods that are known triggers of her symptoms especially drier, larger meats. Recommend she continue her PPI for long-term effectiveness, barium pill esophagram. Esophagram showed primary esophageal weighs intact although secondary and tertiary waves were present as well. No obvious stricture or narrowing.  Today she states she's doing better. Dysphagia symptoms improved. Eating more approximately 75% per daughter. Exploring foods she enjoys that she can tolerate. Still taking Protonix.No GERD symptoms at this time. Having regular bowel movements. Denies abdominal pain, N/V, hematochezia, and melena. Energy level improved. Seeing PT for knee problems with goal of ambulation. Denies dizziness, lightheadedness, syncope, near syncope. Denies any other upper or lower GI symptoms.   Past Medical History  Diagnosis Date  . Type 2 diabetes mellitus ([place])   . Essential hypertension   . HLD (hyperlipidemia)   . Depression   . Septic arthritis ([facility])     MRSA, left knee   . GI bleed     Severe gastritis and duodenal ulcers by EGD May 2016   . PSVT (paroxysmal supraventricular tachycardia) [facility])     Past Surgical History  Procedure Laterality Date  . Cholecystectomy N/A 11/04/2012    Procedure: LAPAROSCOPIC CHOLECYSTECTOMY;  Surgeon: Jamesetta So, MD;  Location: [facility];  Service: General;  Laterality: N/A;  . Knee surgery    . I&d extremity Left 12/24/2014    Procedure: IRRIGATION AND DEBRIDEMENT EXTREMITY AND  ARTHROSCOPY KNEE;  Surgeon: Renette Butters, MD;  Location: [facility];  Service: Orthopedics;  Laterality: Left;  . Esophagogastroduodenoscopy N/A 01/06/2015    SLF: mild esophagitis  . Savory dilation  01/06/2015    Procedure: SAVORY DILATION;  Surgeon: Danie Binder, MD;  [facility];  Service: Endoscopy;;    Current Outpatient Prescriptions  Medication Sig Dispense Refill  . acetaminophen (TYLENOL) 325 MG tablet Take 650 mg by mouth every 6 (six) hours as needed for mild pain or moderate pain.     Marland Kitchen atorvastatin (LIPITOR) 80 MG tablet Take 80 mg by mouth daily at 6 PM.    . diltiazem (CARDIZEM CD) 120 MG 24 hr capsule Take 1 capsule (120 mg total) by mouth daily. 30 capsule 0  . ferrous sulfate 325 (65 FE) MG tablet Take 325 mg by mouth daily with breakfast.    . losartan (COZAAR) 100 MG tablet     . ONE TOUCH ULTRA TEST test strip     . ONETOUCH DELICA LANCETS [id number] MISC     . pantoprazole (PROTONIX) 40 MG tablet      No current facility-administered medications for this visit.    Allergies as of 06/21/2015 - Review Complete 06/21/2015  Allergen Reaction Noted  . Oxycodone Hives 12/30/2014    Family History  Problem Relation Age of Onset  . Family history unknown: Yes    Social History   Social History  . Marital Status: Widowed    Spouse Name: N/A  . Number of Children: 9  . Years of Education: N/A   Occupational  History  . retired; [place] History Main Topics  . Smoking status: Never Smoker   . Smokeless tobacco: None  . Alcohol Use: No  . Drug Use: No  . Sexual Activity: Not Asked   Other Topics Concern  . None   Social History Narrative   Lives alone   3 daughters & granddaughter nearby   Lost 1 son-strep          Review of Systems: 10-point ROS negative except as per HPI.  Physical Exam: BP 105/68 mmHg  Pulse 118  Temp(Src) 98.3 F (36.8 C) (Oral)  Ht 5\' 7"  (1.702 m) General:   Alert and oriented. Pleasant and  cooperative. Well-nourished and well-developed. Pleasantly conversational and joking.  Head:  Normocephalic and atraumatic. Cardiovascular:  S1, S2 present without murmurs appreciated. Extremities without clubbing or edema. Respiratory:  Clear to auscultation bilaterally. No wheezes, rales, or rhonchi. No distress.  Gastrointestinal:  +BS, soft, non-tender and non-distended. No HSM noted. No guarding or rebound. No masses appreciated.  Rectal:  Deferred  Musculoskalatal:  Wheelchair bound. Neurologic:  Alert and oriented x4;  grossly normal neurologically. Psych:  Alert and cooperative. Normal mood and affect. Heme/Lymph/Immune: No excessive bruising noted.    06/21/2015 9:20 AM

## 2015-06-21 NOTE — Assessment & Plan Note (Signed)
Dysphagia well-controlled previously discussed precautions including softer foods, chewing adequately, eating slowly, eating smaller meals. Appetite is improved drastically with subsequent improvement in energy level. Per her daughter she has begun exploring other foods that meet criteria for her precautions she enjoys. At this point we'll have her return for follow-up in 6 months for longer-term evaluation.

## 2015-06-22 DIAGNOSIS — I83009 Varicose veins of unspecified lower extremity with ulcer of unspecified site: Secondary | ICD-10-CM | POA: Diagnosis not present

## 2015-06-22 DIAGNOSIS — I872 Venous insufficiency (chronic) (peripheral): Secondary | ICD-10-CM | POA: Diagnosis not present

## 2015-06-29 DIAGNOSIS — S42301A Unspecified fracture of shaft of humerus, right arm, initial encounter for closed fracture: Secondary | ICD-10-CM | POA: Diagnosis not present

## 2015-06-29 DIAGNOSIS — S82002A Unspecified fracture of left patella, initial encounter for closed fracture: Secondary | ICD-10-CM | POA: Diagnosis not present

## 2015-07-04 DIAGNOSIS — E119 Type 2 diabetes mellitus without complications: Secondary | ICD-10-CM | POA: Diagnosis not present

## 2015-07-05 DIAGNOSIS — N183 Chronic kidney disease, stage 3 (moderate): Secondary | ICD-10-CM | POA: Diagnosis not present

## 2015-07-05 DIAGNOSIS — Z79899 Other long term (current) drug therapy: Secondary | ICD-10-CM | POA: Diagnosis not present

## 2015-07-05 DIAGNOSIS — D649 Anemia, unspecified: Secondary | ICD-10-CM | POA: Diagnosis not present

## 2015-07-13 DIAGNOSIS — D649 Anemia, unspecified: Secondary | ICD-10-CM | POA: Diagnosis not present

## 2015-07-13 DIAGNOSIS — N183 Chronic kidney disease, stage 3 (moderate): Secondary | ICD-10-CM | POA: Diagnosis not present

## 2015-07-13 DIAGNOSIS — I10 Essential (primary) hypertension: Secondary | ICD-10-CM | POA: Diagnosis not present

## 2015-07-20 DIAGNOSIS — I872 Venous insufficiency (chronic) (peripheral): Secondary | ICD-10-CM | POA: Diagnosis not present

## 2015-07-30 DIAGNOSIS — S82002A Unspecified fracture of left patella, initial encounter for closed fracture: Secondary | ICD-10-CM | POA: Diagnosis not present

## 2015-07-30 DIAGNOSIS — S42301A Unspecified fracture of shaft of humerus, right arm, initial encounter for closed fracture: Secondary | ICD-10-CM | POA: Diagnosis not present

## 2015-07-31 ENCOUNTER — Encounter (HOSPITAL_COMMUNITY): Payer: Medicare Other | Attending: Internal Medicine | Admitting: Hematology & Oncology

## 2015-07-31 ENCOUNTER — Encounter (HOSPITAL_COMMUNITY): Payer: Self-pay | Admitting: Hematology & Oncology

## 2015-07-31 VITALS — BP 153/75 | HR 114 | Temp 98.7°F | Resp 16 | Ht 65.0 in | Wt 142.0 lb

## 2015-07-31 DIAGNOSIS — D649 Anemia, unspecified: Secondary | ICD-10-CM | POA: Diagnosis not present

## 2015-07-31 LAB — CBC WITH DIFFERENTIAL/PLATELET
Basophils Absolute: 0 10*3/uL (ref 0.0–0.1)
Basophils Relative: 0 %
Eosinophils Absolute: 0.1 10*3/uL (ref 0.0–0.7)
Eosinophils Relative: 1 %
HCT: 22 % — ABNORMAL LOW (ref 36.0–46.0)
Hemoglobin: 7.1 g/dL — ABNORMAL LOW (ref 12.0–15.0)
Lymphocytes Relative: 37 %
Lymphs Abs: 3 10*3/uL (ref 0.7–4.0)
MCH: 30.1 pg (ref 26.0–34.0)
MCHC: 32.3 g/dL (ref 30.0–36.0)
MCV: 93.2 fL (ref 78.0–100.0)
Monocytes Absolute: 0.6 10*3/uL (ref 0.1–1.0)
Monocytes Relative: 8 %
Neutro Abs: 4.3 10*3/uL (ref 1.7–7.7)
Neutrophils Relative %: 54 %
Platelets: 330 10*3/uL (ref 150–400)
RBC: 2.36 MIL/uL — ABNORMAL LOW (ref 3.87–5.11)
RDW: 16.6 % — ABNORMAL HIGH (ref 11.5–15.5)
WBC: 8.1 10*3/uL (ref 4.0–10.5)

## 2015-07-31 LAB — SEDIMENTATION RATE: Sed Rate: 138 mm/hr — ABNORMAL HIGH (ref 0–22)

## 2015-07-31 LAB — FERRITIN: Ferritin: 444 ng/mL — ABNORMAL HIGH (ref 11–307)

## 2015-07-31 LAB — COMPREHENSIVE METABOLIC PANEL
ALT: 10 U/L — ABNORMAL LOW (ref 14–54)
AST: 16 U/L (ref 15–41)
Albumin: 2.1 g/dL — ABNORMAL LOW (ref 3.5–5.0)
Alkaline Phosphatase: 69 U/L (ref 38–126)
Anion gap: 4 — ABNORMAL LOW (ref 5–15)
BUN: 30 mg/dL — ABNORMAL HIGH (ref 6–20)
CO2: 24 mmol/L (ref 22–32)
Calcium: 8.1 mg/dL — ABNORMAL LOW (ref 8.9–10.3)
Chloride: 113 mmol/L — ABNORMAL HIGH (ref 101–111)
Creatinine, Ser: 1.67 mg/dL — ABNORMAL HIGH (ref 0.44–1.00)
GFR calc Af Amer: 32 mL/min — ABNORMAL LOW (ref >60)
GFR calc non Af Amer: 27 mL/min — ABNORMAL LOW (ref >60)
Glucose, Bld: 84 mg/dL (ref 65–99)
Potassium: 4.2 mmol/L (ref 3.5–5.1)
Sodium: 141 mmol/L (ref 135–145)
Total Bilirubin: 0.4 mg/dL (ref 0.3–1.2)
Total Protein: 7.5 g/dL (ref 6.5–8.1)

## 2015-07-31 LAB — RETICULOCYTES
RBC.: 2.36 MIL/uL — ABNORMAL LOW (ref 3.87–5.11)
Retic Count, Absolute: 42.5 10*3/uL (ref 19.0–186.0)
Retic Ct Pct: 1.8 % (ref 0.4–3.1)

## 2015-07-31 LAB — FOLATE: Folate: 18.3 ng/mL (ref >5.9)

## 2015-07-31 LAB — TSH: TSH: 2.933 u[IU]/mL (ref 0.350–4.500)

## 2015-07-31 LAB — VITAMIN B12: Vitamin B-12: 196 pg/mL (ref 180–914)

## 2015-07-31 LAB — C-REACTIVE PROTEIN: CRP: 3.8 mg/dL — ABNORMAL HIGH (ref <1.0)

## 2015-07-31 LAB — PREPARE RBC (CROSSMATCH)

## 2015-07-31 LAB — LACTATE DEHYDROGENASE: LDH: 215 U/L — ABNORMAL HIGH (ref 98–192)

## 2015-07-31 NOTE — Patient Instructions (Addendum)
[place] at [facility] Discharge Instructions  RECOMMENDATIONS MADE BY THE CONSULTANT AND ANY TEST RESULTS WILL BE SENT TO YOUR REFERRING PHYSICIAN.   Exam completed by Dr Whitney Muse today Blood work today Mammogram scheduled You can stop taking your oral iron pill. Return to see the doctor in 2 weeks  Please call the clinic if you have any questions or concerns   Thank you for choosing [place] at [facility] to provide your oncology and hematology care.  To afford each patient quality time with our provider, please arrive at least 15 minutes before your scheduled appointment time.    You need to re-schedule your appointment should you arrive 10 or more minutes late.  We strive to give you quality time with our providers, and arriving late affects you and other patients whose appointments are after yours.  Also, if you no show three or more times for appointments you may be dismissed from the clinic at the providers discretion.     Again, thank you for choosing [facility].  Our hope is that these requests will decrease the amount of time that you wait before being seen by our physicians.       _____________________________________________________________  Should you have questions after your visit to [facility], please contact our office at [phone] between the hours of 8:30 a.m. and 4:30 p.m.  Voicemails left after 4:30 p.m. will not be returned until the following business day.  For prescription refill requests, have your pharmacy contact our office.

## 2015-07-31 NOTE — Progress Notes (Signed)
[place] at [place] NOTE  Patient Care Team: Laura Davenport as PCP - General (Internal Medicine) Laura Davenport as Consulting Physician (Gastroenterology)  CHIEF COMPLAINTS/PURPOSE OF CONSULTATION:  Anemia  Upper endoscopy, EGD, but no colon seen Severe erosive gastritis, moderate duodenitis  HISTORY OF PRESENTING ILLNESS:   Laura Davenport 81 y.o. female is here because of anemia  Laura Davenport is in a wheelchair. She is here today with her daughter, who says "I'm Laura Davenport too." She's the oldest child.  Laura Davenport says when she went to see Laura Davenport 2-3 weeks ago, he didn't like her blood count. She says he told her that he was going to send her over here. She confirms that she has had to have a blood transfusion before.  She hasn't had a colonoscopy that she knows of, and hasn't found any blood in her stool that she knows of. Her daughter says that her appetite is getting better; in the last 6 weeks, it has improved 85%.  Her daughter states that she's eating a whole lot better and a whole lot more frequently. Now she is eating meals with snacks between meals.  Laura Davenport lost her appetite when she was in the [place] center. She weighed close to 300 when she went in, but she's lost a lot of weight since then. She says she was in the [place] center from the end of February and came home May the 25th. In between that, her knee got infected; had to check that out at [facility]; she received an antibiotic she was allergic to. 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   Her daughter states that Laura Davenport's had a rough year. Laura Davenport still cannot get up and walk on her own. Her daughter is taking care of her at the moment and resides with her.  Laura Davenport had physical therapy in two places, at [facility] and elsewhere. Her daughter says "tell 'em why you stopped taking physical therapy" and Laura Davenport says  "I just stopped taking it." She says she was kicked out of physical therapy because they told her she "had to go" because she was not progressing.  In terms of hobbies, she says "I like church." She says she/s involved in a lot of church groups. She also likes word puzzles.  She has had anemia dating back to at least 2014. Counts significantly worsened during her hospital stay for her septic joint. Since that time counts have ranged from 11.4 to 6.8.   MEDICAL HISTORY:  Past Medical History  Diagnosis Date  . Type 2 diabetes mellitus ([place])   . Essential hypertension   . HLD (hyperlipidemia)   . Depression   . Septic arthritis (HCC)     MRSA, left knee   . GI bleed     Severe gastritis and duodenal ulcers by EGD May 2016   . PSVT (paroxysmal supraventricular tachycardia) ([place])     SURGICAL HISTORY: Past Surgical History  Procedure Laterality Date  . Cholecystectomy N/A 11/04/2012    Procedure: LAPAROSCOPIC CHOLECYSTECTOMY;  Surgeon: Laura Davenport;  Location: [facility];  Service: General;  Laterality: N/A;  . Knee surgery    . I&d extremity Left 12/24/2014    Procedure: IRRIGATION AND DEBRIDEMENT EXTREMITY AND ARTHROSCOPY KNEE;  Surgeon: Laura Davenport;  Location: [place];  Service: Orthopedics;  Laterality: Left;  . Esophagogastroduodenoscopy N/A  01/06/2015    SLF: mild esophagitis  . Savory dilation  01/06/2015    Procedure: SAVORY DILATION;  Surgeon: Laura Davenport;  [facility];  Service: Endoscopy;;    SOCIAL HISTORY: Social History   Social History  . Marital Status: Widowed    Spouse Name: N/A  . Number of Children: 31  . Years of Education: N/A   Occupational History  . retired; [place] History Main Topics  . Smoking status: Never Smoker   . Smokeless tobacco: Not on file  . Alcohol Use: No  . Drug Use: No  . Sexual Activity: Not on file   Other Topics Concern  . Not on file   Social History Narrative   Lives alone   3  daughters & granddaughter nearby   Lost 1 son-strep         She had 10 children, but 1 passed away. 9 living. She says she has close to 35 grandchildren Her daughter reports 8 great-grandchildren. Some of her descendants live in [place], one lives in [place], a couple live in [place]; but most are close by. One of her sons lived in [place], and one daughter lived in [place]. She says some of the younger ones went to college, and they all finished high school.  She is widowed; her husband passed about 15 years ago. She never smoked; she denies any problems with alcohol. 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 She says she "did a little bit of everything." She used to work at [employer] here. 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  She says she doesn't remember her mother or father; her grandmother raised her. She says she never knew them. She also had no brothers and sisters that she knew of, but might have had them. Her grandmother lived to be in her late 72's or 84's; her mother's mother. Her daughter ([name]'s aunt) took care of [name] after that.  In terms of hobbies, she says "I like church." She says she/s involved in a lot of church groups. She also likes word puzzles.  FAMILY HISTORY: Family History  Problem Relation Age of Onset  . Family history unknown: Yes   She says she doesn't remember her mother or father; her grandmother raised her. She says she never knew them. She also had no brothers and sisters that she knew of, but might have had them. Her grandmother lived to be in her late 10's or 34's; her mother's mother. Her daughter ([name]) took care of [name] after that.  indicated that her mother is deceased. She indicated that her father is deceased.   ALLERGIES:  is allergic to oxycodone.  MEDICATIONS:  Current Outpatient Prescriptions    Medication Sig Dispense Refill  . acetaminophen (TYLENOL) 325 MG tablet Take 650 mg by mouth every 6 (six) hours as needed for mild pain or moderate pain.     . clobetasol cream (TEMOVATE) 0.05 %     . diltiazem (CARDIZEM CD) 120 MG 24 hr capsule Take 1 capsule (120 mg total) by mouth daily. 30 capsule 0  . losartan (COZAAR) 100 MG tablet     . pantoprazole (PROTONIX) 40 MG tablet     . atorvastatin (LIPITOR) 80 MG tablet Take 80 mg by mouth daily at 6 PM.     No current facility-administered medications for this visit.  Review of Systems  Constitutional: Negative.   HENT: Negative.   Eyes: Negative.   Respiratory: Negative.   Cardiovascular: Negative.   Gastrointestinal: Negative.   Genitourinary: Negative.   Musculoskeletal: Positive for joint pain.       Pain in the injured left knee. 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  Skin: Negative.   Neurological: Positive for tremors.  Endo/Heme/Allergies: Negative.   Psychiatric/Behavioral: Negative.   All other systems reviewed and are negative.  14 point ROS was done and is otherwise as detailed above or in HPI  PHYSICAL EXAMINATION: ECOG PERFORMANCE STATUS: 2 - Symptomatic, <50% confined to bed  Filed Vitals:   07/31/15 1442  BP: 153/75  Pulse: 114  Temp: 98.7 F (37.1 C)  Resp: 16   Filed Weights   07/31/15 1442  Weight: 142 lb (64.411 kg)   Physical Exam  Constitutional: She is oriented to person, place, and time and well-developed, well-nourished, and in no distress.  Exam performed in wheelchair because she cannot ambulate to the table.  She has a tremor as well.  HENT:  Head: Normocephalic and atraumatic.  Mouth/Throat: Oropharynx is clear and moist.  Eyes: Conjunctivae and EOM are normal. Pupils are equal, round, and reactive to light.  Neck: Normal range of motion. Neck supple. No tracheal deviation present. No thyromegaly present.  Cardiovascular: Normal rate, regular rhythm and normal heart  sounds.   Pulmonary/Chest: Effort normal and breath sounds normal.  Abdominal: Soft. Bowel sounds are normal. She exhibits no distension and no mass. There is no tenderness. There is no rebound and no guarding.  Musculoskeletal: She exhibits no edema.  Left knee injury; surgical incision site well healed. Her joint is frozen with little extension. knee swollen  Lymphadenopathy:    She has no cervical adenopathy.  Neurological: She is alert and oriented to person, place, and time. No cranial nerve deficit.  Head tremor   Skin: Skin is warm and dry.  Psychiatric: Mood, memory, affect and judgment normal.  Nursing note and vitals reviewed.   LABORATORY DATA:  I have reviewed the data as listed Lab Results  Component Value Date   WBC 8.1 07/31/2015   HGB 7.1* 07/31/2015   HCT 22.0* 07/31/2015   MCV 93.2 07/31/2015   PLT 330 07/31/2015   CMP     Component Value Date/Time   NA 141 07/31/2015 1500   K 4.2 07/31/2015 1500   CL 113* 07/31/2015 1500   CO2 24 07/31/2015 1500   GLUCOSE 84 07/31/2015 1500   BUN 30* 07/31/2015 1500   CREATININE 1.67* 07/31/2015 1500   CALCIUM 8.1* 07/31/2015 1500   PROT 7.5 07/31/2015 1500   ALBUMIN 2.1* 07/31/2015 1500   AST 16 07/31/2015 1500   ALT 10* 07/31/2015 1500   ALKPHOS 69 07/31/2015 1500   BILITOT 0.4 07/31/2015 1500   GFRNONAA 27* 07/31/2015 1500   GFRAA 32* 07/31/2015 1500    ASSESSMENT & PLAN:  Anemia CKD, Stage III EGD with severe erosive gastritis  I discussed anemia with the patient and her daughter, potential causes including CKD, MDS and nutritional deficiencies. I advised them I would recommend proceeding with a peripheral evaluation today. I discussed that the patient may have multiple contributors to her anemia.  Given her hgb today I have recommended a transfusion of PRBC's and she is agreeable.  We will arrange for this in the next several days.  She is currently asymptomatic.   She will need to have hemoccults.  I  will also schedule her for a mammogram.  We briefly addressed BMBX in the workup of anemia.  We will proceed with peripheral studies first. I advised them that realistically we may not need to pursue a marrow pending the results of todays studies.  I will see her back in 2 weeks to review and for additional recommendations.   Orders Placed This Encounter  Procedures  . CBC with Differential  . Comprehensive metabolic panel  . Sedimentation rate  . Reticulocytes  . Lactate dehydrogenase  . IgG, IgA, IgM  . Immunofixation electrophoresis  . Protein electrophoresis, serum  . Kappa/lambda light chains  . Erythropoietin  . Vitamin B12  . Folate  . Ferritin  . Haptoglobin  . TSH  . Copper, serum  . C-reactive protein  . Practitioner attestation of consent    I, the ordering practitioner, attest that I have discussed with the patient the benefits, risks, side effects, alternatives, likelihood of achieving goals and potential problems during recovery for the procedure listed.    Standing Status: Future     Number of Occurrences: 1     Standing Expiration Date: 07/30/2016    Order Specific Question:  Procedure    Answer:  Blood Product(s)  . Complete patient signature process for consent form    Standing Status: Future     Number of Occurrences: 1     Standing Expiration Date: 07/30/2016  . Care order/instruction    Transfuse Parameters    Standing Status: Future     Number of Occurrences: 1     Standing Expiration Date: 07/30/2016  . Prepare RBC    Standing Status: Standing     Number of Occurrences: 1     Standing Expiration Date:     Order Specific Question:  # of Units    Answer:  2 units    Order Specific Question:  Transfusion Indications    Answer:  Symptomatic Anemia    Order Specific Question:  If emergent release call blood bank    Answer:  [facility] [phone]  . Type and screen    Standing Status: Future     Number of Occurrences: 1     Standing Expiration  Date: 07/30/2016   All questions were answered. The patient knows to call the clinic with any problems, questions or concerns.  This document serves as a record of services personally performed by Ancil Linsey, Davenport. It was created on her behalf by Toni Amend, a trained medical scribe. The creation of this record is based on the scribe's personal observations and the provider's statements to them. This document has been checked and approved by the attending provider.  I have reviewed the above documentation for accuracy and completeness, and I agree with the above.  This note was electronically signed.    Molli Hazard, Davenport  07/31/2015 8:03 PM

## 2015-07-31 NOTE — Progress Notes (Signed)
..  Laura Davenport's reason for visit today is for labs as scheduled per MD orders.  Venipuncture performed with a 23 gauge butterfly needle to R Antecubital.  Margaretha Seeds tolerated procedure well and without incident; questions were answered and patient was discharged.

## 2015-08-01 LAB — KAPPA/LAMBDA LIGHT CHAINS
Kappa free light chain: 153.89 mg/L — ABNORMAL HIGH (ref 3.30–19.40)
Kappa, lambda light chain ratio: 1.61 (ref 0.26–1.65)
Lambda free light chains: 95.46 mg/L — ABNORMAL HIGH (ref 5.71–26.30)

## 2015-08-01 LAB — IGG, IGA, IGM
IgA: 443 mg/dL — ABNORMAL HIGH (ref 64–422)
IgG (Immunoglobin G), Serum: 3072 mg/dL — ABNORMAL HIGH (ref 700–1600)
IgM, Serum: 135 mg/dL (ref 26–217)

## 2015-08-01 LAB — HAPTOGLOBIN: Haptoglobin: 140 mg/dL (ref 34–200)

## 2015-08-01 LAB — ERYTHROPOIETIN: Erythropoietin: 16.3 m[IU]/mL (ref 2.6–18.5)

## 2015-08-02 ENCOUNTER — Other Ambulatory Visit (HOSPITAL_COMMUNITY): Payer: Self-pay | Admitting: *Deleted

## 2015-08-02 DIAGNOSIS — D649 Anemia, unspecified: Secondary | ICD-10-CM

## 2015-08-02 LAB — PROTEIN ELECTROPHORESIS, SERUM
A/G Ratio: 0.5 — ABNORMAL LOW (ref 0.7–1.7)
Albumin ELP: 2.4 g/dL — ABNORMAL LOW (ref 2.9–4.4)
Alpha-1-Globulin: 0.2 g/dL (ref 0.0–0.4)
Alpha-2-Globulin: 0.7 g/dL (ref 0.4–1.0)
Beta Globulin: 1.1 g/dL (ref 0.7–1.3)
Gamma Globulin: 2.9 g/dL — ABNORMAL HIGH (ref 0.4–1.8)
Globulin, Total: 4.8 g/dL — ABNORMAL HIGH (ref 2.2–3.9)
Total Protein ELP: 7.2 g/dL (ref 6.0–8.5)

## 2015-08-02 LAB — IMMUNOFIXATION ELECTROPHORESIS
IgA: 446 mg/dL — ABNORMAL HIGH (ref 64–422)
IgG (Immunoglobin G), Serum: 3060 mg/dL — ABNORMAL HIGH (ref 700–1600)
IgM, Serum: 136 mg/dL (ref 26–217)
Total Protein ELP: 7.2 g/dL (ref 6.0–8.5)

## 2015-08-02 LAB — COPPER, SERUM: Copper: 128 ug/dL (ref 72–166)

## 2015-08-03 ENCOUNTER — Encounter (HOSPITAL_COMMUNITY): Payer: Medicare Other | Attending: Internal Medicine

## 2015-08-03 ENCOUNTER — Encounter (HOSPITAL_COMMUNITY): Payer: Self-pay

## 2015-08-03 ENCOUNTER — Other Ambulatory Visit (HOSPITAL_COMMUNITY): Payer: Self-pay | Admitting: Hematology & Oncology

## 2015-08-03 ENCOUNTER — Encounter (HOSPITAL_BASED_OUTPATIENT_CLINIC_OR_DEPARTMENT_OTHER): Payer: Medicare Other

## 2015-08-03 VITALS — BP 155/69 | HR 97 | Temp 98.4°F | Resp 18

## 2015-08-03 DIAGNOSIS — D649 Anemia, unspecified: Secondary | ICD-10-CM | POA: Diagnosis present

## 2015-08-03 DIAGNOSIS — Z139 Encounter for screening, unspecified: Secondary | ICD-10-CM

## 2015-08-03 MED ORDER — DIPHENHYDRAMINE HCL 25 MG PO CAPS
ORAL_CAPSULE | ORAL | Status: AC
  Filled 2015-08-03: qty 1

## 2015-08-03 MED ORDER — ACETAMINOPHEN 325 MG PO TABS
ORAL_TABLET | ORAL | Status: AC
  Filled 2015-08-03: qty 2

## 2015-08-03 MED ORDER — SODIUM CHLORIDE 0.9 % IV SOLN
250.0000 mL | Freq: Once | INTRAVENOUS | Status: AC
  Administered 2015-08-03: 250 mL via INTRAVENOUS

## 2015-08-03 MED ORDER — DIPHENHYDRAMINE HCL 25 MG PO CAPS
25.0000 mg | ORAL_CAPSULE | Freq: Once | ORAL | Status: AC
  Administered 2015-08-03: 25 mg via ORAL

## 2015-08-03 MED ORDER — ACETAMINOPHEN 325 MG PO TABS
650.0000 mg | ORAL_TABLET | Freq: Once | ORAL | Status: AC
  Administered 2015-08-03: 650 mg via ORAL

## 2015-08-03 MED ORDER — SODIUM CHLORIDE 0.9 % IJ SOLN
10.0000 mL | INTRAMUSCULAR | Status: AC | PRN
  Administered 2015-08-03: 10 mL

## 2015-08-03 NOTE — Patient Instructions (Signed)
[place] at [facility] Discharge Instructions  RECOMMENDATIONS MADE BY THE CONSULTANT AND ANY TEST RESULTS WILL BE SENT TO YOUR REFERRING PHYSICIAN.  Blood transfusion today. Return as scheduled for lab work and office visit.   Thank you for choosing [place] at [facility] to provide your oncology and hematology care.  To afford each patient quality time with our provider, please arrive at least 15 minutes before your scheduled appointment time.    You need to re-schedule your appointment should you arrive 10 or more minutes late.  We strive to give you quality time with our providers, and arriving late affects you and other patients whose appointments are after yours.  Also, if you no show three or more times for appointments you may be dismissed from the clinic at the providers discretion.     Again, thank you for choosing [facility].  Our hope is that these requests will decrease the amount of time that you wait before being seen by our physicians.       _____________________________________________________________  Should you have questions after your visit to [facility], please contact our office at [phone] between the hours of 8:30 a.m. and 4:30 p.m.  Voicemails left after 4:30 p.m. will not be returned until the following business day.  For prescription refill requests, have your pharmacy contact our office.

## 2015-08-03 NOTE — Progress Notes (Signed)
LABS DRAWN

## 2015-08-04 ENCOUNTER — Telehealth (HOSPITAL_COMMUNITY): Payer: Self-pay | Admitting: Hematology & Oncology

## 2015-08-04 ENCOUNTER — Other Ambulatory Visit (HOSPITAL_COMMUNITY): Payer: Self-pay | Admitting: Hematology & Oncology

## 2015-08-04 DIAGNOSIS — Z1231 Encounter for screening mammogram for malignant neoplasm of breast: Secondary | ICD-10-CM

## 2015-08-04 LAB — TYPE AND SCREEN
ABO/RH(D): A POS
Antibody Screen: NEGATIVE
Unit division: 0
Unit division: 0
Unit division: 0

## 2015-08-04 LAB — RHEUMATOID FACTOR: Rhuematoid fact SerPl-aCnc: 11.5 IU/mL (ref 0.0–13.9)

## 2015-08-06 LAB — MISC LABCORP TEST (SEND OUT)

## 2015-08-11 ENCOUNTER — Ambulatory Visit (HOSPITAL_COMMUNITY)
Admission: RE | Admit: 2015-08-11 | Discharge: 2015-08-11 | Disposition: A | Discharge status: Home / Self Care | Payer: Medicare Other | Source: Ambulatory Visit | Attending: Hematology & Oncology | Admitting: Hematology & Oncology

## 2015-08-11 DIAGNOSIS — Z1231 Encounter for screening mammogram for malignant neoplasm of breast: Secondary | ICD-10-CM

## 2015-08-15 DIAGNOSIS — I10 Essential (primary) hypertension: Secondary | ICD-10-CM | POA: Diagnosis not present

## 2015-08-15 DIAGNOSIS — E119 Type 2 diabetes mellitus without complications: Secondary | ICD-10-CM | POA: Diagnosis not present

## 2015-08-15 DIAGNOSIS — Z79899 Other long term (current) drug therapy: Secondary | ICD-10-CM | POA: Diagnosis not present

## 2015-08-15 DIAGNOSIS — E785 Hyperlipidemia, unspecified: Secondary | ICD-10-CM | POA: Diagnosis not present

## 2015-08-18 ENCOUNTER — Ambulatory Visit (HOSPITAL_COMMUNITY): Payer: Self-pay | Admitting: Hematology & Oncology

## 2015-08-18 NOTE — Progress Notes (Signed)
This encounter was created in error - please disregard.

## 2015-08-22 DIAGNOSIS — D649 Anemia, unspecified: Secondary | ICD-10-CM | POA: Diagnosis not present

## 2015-08-22 DIAGNOSIS — E119 Type 2 diabetes mellitus without complications: Secondary | ICD-10-CM | POA: Diagnosis not present

## 2015-08-22 DIAGNOSIS — E46 Unspecified protein-calorie malnutrition: Secondary | ICD-10-CM | POA: Diagnosis not present

## 2015-08-22 DIAGNOSIS — N183 Chronic kidney disease, stage 3 (moderate): Secondary | ICD-10-CM | POA: Diagnosis not present

## 2015-08-24 ENCOUNTER — Encounter (HOSPITAL_COMMUNITY): Payer: Self-pay

## 2015-08-29 DIAGNOSIS — S42301A Unspecified fracture of shaft of humerus, right arm, initial encounter for closed fracture: Secondary | ICD-10-CM | POA: Diagnosis not present

## 2015-08-29 DIAGNOSIS — S82002A Unspecified fracture of left patella, initial encounter for closed fracture: Secondary | ICD-10-CM | POA: Diagnosis not present

## 2015-09-28 DIAGNOSIS — E119 Type 2 diabetes mellitus without complications: Secondary | ICD-10-CM | POA: Diagnosis not present

## 2015-09-28 DIAGNOSIS — I10 Essential (primary) hypertension: Secondary | ICD-10-CM | POA: Diagnosis not present

## 2015-09-28 DIAGNOSIS — Z79899 Other long term (current) drug therapy: Secondary | ICD-10-CM | POA: Diagnosis not present

## 2015-09-28 DIAGNOSIS — E785 Hyperlipidemia, unspecified: Secondary | ICD-10-CM | POA: Diagnosis not present

## 2015-09-29 DIAGNOSIS — S42301A Unspecified fracture of shaft of humerus, right arm, initial encounter for closed fracture: Secondary | ICD-10-CM | POA: Diagnosis not present

## 2015-09-29 DIAGNOSIS — S82002A Unspecified fracture of left patella, initial encounter for closed fracture: Secondary | ICD-10-CM | POA: Diagnosis not present

## 2015-10-05 DIAGNOSIS — E46 Unspecified protein-calorie malnutrition: Secondary | ICD-10-CM | POA: Diagnosis not present

## 2015-10-05 DIAGNOSIS — I10 Essential (primary) hypertension: Secondary | ICD-10-CM | POA: Diagnosis not present

## 2015-10-07 DIAGNOSIS — E119 Type 2 diabetes mellitus without complications: Secondary | ICD-10-CM | POA: Diagnosis not present

## 2015-10-25 ENCOUNTER — Encounter (HOSPITAL_COMMUNITY): Payer: Self-pay | Admitting: Hematology & Oncology

## 2015-10-25 ENCOUNTER — Encounter (HOSPITAL_COMMUNITY): Payer: Medicare Other | Attending: Internal Medicine | Admitting: Hematology & Oncology

## 2015-10-25 VITALS — BP 165/76 | HR 98 | Temp 98.7°F | Resp 18 | Ht 65.0 in | Wt 138.0 lb

## 2015-10-25 DIAGNOSIS — N183 Chronic kidney disease, stage 3 (moderate): Secondary | ICD-10-CM

## 2015-10-25 DIAGNOSIS — D890 Polyclonal hypergammaglobulinemia: Secondary | ICD-10-CM

## 2015-10-25 DIAGNOSIS — D649 Anemia, unspecified: Secondary | ICD-10-CM

## 2015-10-25 DIAGNOSIS — K2980 Duodenitis without bleeding: Secondary | ICD-10-CM

## 2015-10-25 DIAGNOSIS — D519 Vitamin B12 deficiency anemia, unspecified: Secondary | ICD-10-CM

## 2015-10-25 DIAGNOSIS — K2900 Acute gastritis without bleeding: Secondary | ICD-10-CM

## 2015-10-25 DIAGNOSIS — D638 Anemia in other chronic diseases classified elsewhere: Secondary | ICD-10-CM

## 2015-10-25 HISTORY — DX: Vitamin B12 deficiency anemia, unspecified: D51.9

## 2015-10-25 LAB — CBC WITH DIFFERENTIAL/PLATELET
Basophils Absolute: 0 10*3/uL (ref 0.0–0.1)
Basophils Relative: 0 %
Eosinophils Absolute: 0.1 10*3/uL (ref 0.0–0.7)
Eosinophils Relative: 2 %
HCT: 24.6 % — ABNORMAL LOW (ref 36.0–46.0)
Hemoglobin: 8.9 g/dL — ABNORMAL LOW (ref 12.0–15.0)
Lymphocytes Relative: 48 %
Lymphs Abs: 3.2 10*3/uL (ref 0.7–4.0)
MCH: 36.2 pg — ABNORMAL HIGH (ref 26.0–34.0)
MCHC: 36.2 g/dL — ABNORMAL HIGH (ref 30.0–36.0)
MCV: 100 fL (ref 78.0–100.0)
Monocytes Absolute: 0.4 10*3/uL (ref 0.1–1.0)
Monocytes Relative: 6 %
Neutro Abs: 2.9 10*3/uL (ref 1.7–7.7)
Neutrophils Relative %: 44 %
Platelets: 282 10*3/uL (ref 150–400)
RBC: 2.46 MIL/uL — ABNORMAL LOW (ref 3.87–5.11)
RDW: 16.1 % — ABNORMAL HIGH (ref 11.5–15.5)
WBC: 6.6 10*3/uL (ref 4.0–10.5)

## 2015-10-25 LAB — SAMPLE TO BLOOD BANK

## 2015-10-25 NOTE — Progress Notes (Signed)
[place] at [facility] Progress Note  Patient Care Team: [name], MD as PCP - General (Internal Medicine) Danie Binder, MD as Consulting Physician (Gastroenterology)  CHIEF COMPLAINTS:  Anemia CKD, Stage III Upper endoscopy, EGD, but no colon seen Severe erosive gastritis, moderate duodenitis ESR 138, Polyclonal gammopathy Normal TSH, copper level, B12, folate Erythropoietin level 16.3 mIU/ml Low B12 196 pg/ml  HISTORY OF PRESENTING ILLNESS:   Laura Davenport 82 y.o. female is here because of anemia. She was originally seen back in November and after a PRBC transfusion was lost to follow-up.  She saw Dr. Willey Blade in the interim who sent her back to [place] for ongoing follow-up.  Laura Davenport is accompanied by her daughter and presents in a wheelchair. I personally reviewed and went over anemia at length with the patient and her daughter.   Her daughter was curious if the anemia could be controlled by diet or if it was caused by her previous knee surgery.  She is eating pretty good and is without complaints at this time.   She denies SOB, CP, nausea or vomiting.   MEDICAL HISTORY:  Past Medical History  Diagnosis Date  . Type 2 diabetes mellitus ([place])   . Essential hypertension   . HLD (hyperlipidemia)   . Depression   . Septic arthritis (HCC)     MRSA, left knee   . GI bleed     Severe gastritis and duodenal ulcers by EGD May 2016   . PSVT (paroxysmal supraventricular tachycardia) ([place])     SURGICAL HISTORY: Past Surgical History  Procedure Laterality Date  . Cholecystectomy N/A 11/04/2012    Procedure: LAPAROSCOPIC CHOLECYSTECTOMY;  Surgeon: Jamesetta So, MD;  Location: [facility];  Service: General;  Laterality: N/A;  . Knee surgery    . I&d extremity Left 12/24/2014    Procedure: IRRIGATION AND DEBRIDEMENT EXTREMITY AND ARTHROSCOPY KNEE;  Surgeon: Renette Butters, MD;  Location: [place];  Service: Orthopedics;  Laterality: Left;  .  Esophagogastroduodenoscopy N/A 01/06/2015    SLF: mild esophagitis  . Savory dilation  01/06/2015    Procedure: SAVORY DILATION;  Surgeon: Danie Binder, MD;  [facility];  Service: Endoscopy;;    SOCIAL HISTORY: Social History   Social History  . Marital Status: Widowed    Spouse Name: N/A  . Number of Children: 87  . Years of Education: N/A   Occupational History  . retired; [place] History Main Topics  . Smoking status: Never Smoker   . Smokeless tobacco: Not on file  . Alcohol Use: No  . Drug Use: No  . Sexual Activity: Not on file   Other Topics Concern  . Not on file   Social History Narrative   Lives alone   3 daughters & granddaughter nearby   Lost 1 son-strep         She had 10 children, but 1 passed away. 9 living. She says she has close to 35 grandchildren Her daughter reports 8 great-grandchildren. Some of her descendants live in [place], one lives in [place], a couple live in [place]; but most are close by. One of her sons lived in [place], and one daughter lived in [place]. She says some of the younger ones went to college, and they all finished high school.  She is widowed; her husband passed about 15 years ago. She never smoked; she denies any problems with alcohol. 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 She says she "did a little bit of everything." She used to work at [employer] here. 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  She says she doesn't remember her mother or father; her grandmother raised her. She says she never knew them. She also had no brothers and sisters that she knew of, but might have had them. Her grandmother lived to be in her late 44's or 33's; her mother's mother. Her daughter (Ms. Cousar's aunt) took care of Ms. Bruyere after that.  In terms of hobbies, she says "I like church." She says she/s involved in a  lot of church groups. She also likes word puzzles.  FAMILY HISTORY: Family History  Problem Relation Age of Onset  . Family history unknown: Yes   She says she doesn't remember her mother or father; her grandmother raised her. She says she never knew them. She also had no brothers and sisters that she knew of, but might have had them. Her grandmother lived to be in her late 71's or 3's; her mother's mother. Her daughter (Ms. Ensey's aunt) took care of Ms. Valek after that.  indicated that her mother is deceased. She indicated that her father is deceased.   ALLERGIES:  is allergic to oxycodone.  MEDICATIONS:  Current Outpatient Prescriptions  Medication Sig Dispense Refill  . acetaminophen (TYLENOL) 325 MG tablet Take 650 mg by mouth every 6 (six) hours as needed for mild pain or moderate pain.     Marland Kitchen atorvastatin (LIPITOR) 80 MG tablet Take 80 mg by mouth daily at 6 PM.    . clobetasol cream (TEMOVATE) 0.05 %     . diltiazem (CARDIZEM CD) 120 MG 24 hr capsule Take 1 capsule (120 mg total) by mouth daily. 30 capsule 0  . losartan (COZAAR) 100 MG tablet     . pantoprazole (PROTONIX) 40 MG tablet      No current facility-administered medications for this visit.    Review of Systems  Constitutional: Negative.   HENT: Negative.   Eyes: Negative.   Respiratory: Negative.   Cardiovascular: Negative.   Gastrointestinal: Negative.   Genitourinary: Negative.   Musculoskeletal: Positive for joint pain.       Pain in the injured left knee. 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  Skin: Negative.   Neurological: Positive for tremors.  Endo/Heme/Allergies: Negative.   Psychiatric/Behavioral: Negative.   All other systems reviewed and are negative.  14 point ROS was done and is otherwise as detailed above or in HPI  PHYSICAL EXAMINATION: ECOG PERFORMANCE STATUS: 2 - Symptomatic, <50% confined to bed  Filed Vitals:   10/25/15 1500  BP: 165/76  Pulse: 98  Temp: 98.7  F (37.1 C)  Resp: 18   Filed Weights   10/25/15 1500  Weight: 138 lb (62.596 kg)   Physical Exam  Constitutional: She is oriented to person, place, and time and well-developed, well-nourished, and in no distress.  Exam performed in wheelchair because she cannot ambulate to the table. Wears glasses. She has a tremor as well.  HENT:  Head: Normocephalic and atraumatic.  Mouth/Throat: Oropharynx is clear and moist.  Eyes: Conjunctivae and EOM are normal. Pupils are equal, round, and reactive to light.  Neck: Normal range of motion. Neck supple. No tracheal deviation present. No thyromegaly present.  Cardiovascular: Normal rate, regular rhythm and normal heart sounds.   Pulmonary/Chest: Effort normal and breath sounds  normal.  Abdominal: Soft. Bowel sounds are normal. She exhibits no distension and no mass. There is no tenderness. There is no rebound and no guarding.  Musculoskeletal: She exhibits no edema.  Left knee injury; surgical incision site well healed. Her joint is frozen with little extension. knee swollen  Lymphadenopathy:    She has no cervical adenopathy.  Neurological: She is alert and oriented to person, place, and time. No cranial nerve deficit.  Head tremor  Skin: Skin is warm and dry.  Psychiatric: Mood, memory, affect and judgment normal.  Nursing note and vitals reviewed.   LABORATORY DATA:  I have reviewed the data as listed Lab Results  Component Value Date   WBC 8.1 07/31/2015   HGB 7.1* 07/31/2015   HCT 22.0* 07/31/2015   MCV 93.2 07/31/2015   PLT 330 07/31/2015   CMP     Component Value Date/Time   NA 141 07/31/2015 1500   K 4.2 07/31/2015 1500   CL 113* 07/31/2015 1500   CO2 24 07/31/2015 1500   GLUCOSE 84 07/31/2015 1500   BUN 30* 07/31/2015 1500   CREATININE 1.67* 07/31/2015 1500   CALCIUM 8.1* 07/31/2015 1500   PROT 7.5 07/31/2015 1500   ALBUMIN 2.1* 07/31/2015 1500   AST 16 07/31/2015 1500   ALT 10* 07/31/2015 1500   ALKPHOS 69  07/31/2015 1500   BILITOT 0.4 07/31/2015 1500   GFRNONAA 27* 07/31/2015 1500   GFRAA 32* 07/31/2015 1500    ASSESSMENT & PLAN:  Anemia CKD, Stage III Upper endoscopy, EGD, but no colon seen Severe erosive gastritis, moderate duodenitis ESR 138, Polyclonal gammopathy, elevated CRP Normal RF, normal CCP antibodies Normal TSH, copper level, B12, folate Erythropoietin level 16.3 mIU/ml Low B12 196 pg/ml   I discussed anemia with the patient and her daughter, potential causes including CKD, MDS and nutritional deficiencies. We reviewed the laboratory studies from her visit with [place] last year. She has B12 deficiency and I have recommended B12 replacement, she is agreeable.   SED rate was markedly elevated at 138. She has a severe anemia. Hemoccults have not been recently done and these will be certainly be needed.   I have ordered a CBC today. Based on these results, we will schedule her for a blood transfusion if needed.  We discussed multiple options for proceeding including following counts closely with transfusions as needed. We discussed the risks of iron overload with ongoing transfusions. We discussed BMBX and the information it may provide. Bot the patient and her daughter are willing to proceed with a BMBX for completion.   She will return after her BMBX to discuss the results.   Orders Placed This Encounter  Procedures  . CBC with Differential    Standing Status: Future     Number of Occurrences: 1     Standing Expiration Date: 10/24/2016  . Sample to Blood Bank    Standing Status: Future     Number of Occurrences: 1     Standing Expiration Date: 10/24/2016   All questions were answered. The patient knows to call the clinic with any problems, questions or concerns.  This document serves as a record of services personally performed by Ancil Linsey, MD. It was created on her behalf by Arlyce Harman, a trained medical scribe. The creation of this record is based on the  scribe's personal observations and the provider's statements to them. This document has been checked and approved by the attending provider.  I have reviewed the above documentation for  accuracy and completeness, and I agree with the above.  This note was electronically signed.    Molli Hazard, MD  10/25/2015 3:21 PM

## 2015-10-25 NOTE — Progress Notes (Signed)
Laura Davenport's reason for visit today is for labs as scheduled per MD orders.  Venipuncture performed with a 23 gauge butterfly needle to R Antecubital.  Laura Davenport tolerated procedure well and without incident; questions were answered and patient was discharged.    Notified pts daughter that her hemoglobin is 8.9 that she does not need a transfusion. Verbalized understanding

## 2015-10-25 NOTE — Patient Instructions (Signed)
[place] at [facility] Discharge Instructions  RECOMMENDATIONS MADE BY THE CONSULTANT AND ANY TEST RESULTS WILL BE SENT TO YOUR REFERRING PHYSICIAN.    Exam and discussion by Dr Whitney Muse today Blood work today Bone marrow biopsy, this will be done at [facility] Return to see the doctor about 2 weeks after your BMBX Please call the clinic if you have any questions or concerns   Thank you for choosing [place] at [facility] to provide your oncology and hematology care.  To afford each patient quality time with our provider, please arrive at least 15 minutes before your scheduled appointment time.   Beginning January 23rd 2017 lab work for the [facility] will be done in the  Main lab at [facility] on 1st floor. If you have a lab appointment with the [facility] please come in thru the  Main Entrance and check in at the main information desk  You need to re-schedule your appointment should you arrive 10 or more minutes late.  We strive to give you quality time with our providers, and arriving late affects you and other patients whose appointments are after yours.  Also, if you no show three or more times for appointments you may be dismissed from the clinic at the providers discretion.     Again, thank you for choosing [facility].  Our hope is that these requests will decrease the amount of time that you wait before being seen by our physicians.       _____________________________________________________________  Should you have questions after your visit to [facility], please contact our office at [phone] between the hours of 8:30 a.m. and 4:30 p.m.  Voicemails left after 4:30 p.m. will not be returned until the following business day.  For prescription refill requests, have your pharmacy contact our office.        Bone Marrow Aspiration and Bone Marrow Biopsy Bone marrow aspiration and bone  marrow biopsy are procedures that are done to diagnose blood disorders. You may also have one of these procedures to help diagnose infections or some types of cancer. Bone marrow is the soft tissue that is inside your bones. Blood cells are produced in bone marrow. For bone marrow aspiration, a sample of tissue in liquid form is removed from inside your bone. For a bone marrow biopsy, a small core of bone marrow tissue is removed. Then these samples are examined under a microscope or tested in a lab. You may need these procedures if you have an abnormal complete blood count (CBC). The aspiration or biopsy sample is usually taken from the top of your hip bone. Sometimes, an aspiration sample is taken from your chest bone (sternum). LET [facility] CARE PROVIDER KNOW ABOUT:  Any allergies you have.  All medicines you are taking, including vitamins, herbs, eye drops, creams, and over-the-counter medicines.  Previous problems you or members of your family have had with the use of anesthetics.  Any blood disorders you have.  Previous surgeries you have had.  Any medical conditions you may have.  Whether you are pregnant or you think that you may be pregnant. RISKS AND COMPLICATIONS Generally, this is a safe procedure. However, problems may occur, including:  Infection.  Bleeding. BEFORE THE PROCEDURE  Ask your health care provider about:  Changing or stopping your regular medicines. This is especially important if you are taking diabetes medicines or blood thinners.  Taking  medicines such as aspirin and ibuprofen. These medicines can thin your blood. Do not take these medicines before your procedure if your health care provider instructs you not to.  Plan to have someone take you home after the procedure.  If you go home right after the procedure, plan to have someone with you for 24 hours. PROCEDURE   An IV tube may be inserted into one of your veins.  The injection site will be  cleaned with a germ-killing solution (antiseptic).  You will be given one or more of the following:  A medicine that helps you relax (sedative).  A medicine that numbs the area (local anesthetic).  The bone marrow sample will be removed as follows:  For an aspiration, a hollow needle will be inserted through your skin and into your bone. Bone marrow fluid will be drawn up into a syringe.  For a biopsy, your health care provider will use a hollow needle to remove a core of tissue from your bone marrow.  The needle will be removed.  A bandage (dressing) will be placed over the insertion site and taped in place. The procedure may vary among health care providers and hospitals. AFTER THE PROCEDURE  Your blood pressure, heart rate, breathing rate, and blood oxygen level will be monitored often until the medicines you were given have worn off.  Return to your normal activities as directed by your health care provider.   This information is not intended to replace advice given to you by your health care provider. Make sure you discuss any questions you have with your health care provider.   Document Released: 08/22/2004 Document Revised: 01/03/2015 Document Reviewed: 08/10/2014 Elsevier Interactive Patient Education [employer].

## 2015-10-27 ENCOUNTER — Encounter: Payer: Self-pay | Admitting: Cardiology

## 2015-10-27 ENCOUNTER — Ambulatory Visit (INDEPENDENT_AMBULATORY_CARE_PROVIDER_SITE_OTHER): Payer: Medicare Other | Admitting: Cardiology

## 2015-10-27 VITALS — BP 154/82 | HR 98 | Ht 66.0 in | Wt 136.0 lb

## 2015-10-27 DIAGNOSIS — I471 Supraventricular tachycardia: Secondary | ICD-10-CM

## 2015-10-27 DIAGNOSIS — I10 Essential (primary) hypertension: Secondary | ICD-10-CM

## 2015-10-27 NOTE — Progress Notes (Signed)
Cardiology Office Note  Date: 10/27/2015   ID: Laura Davenport, DOB [birth year], MRN [id number]  PCP: [name], MD  Primary Cardiologist: Rozann Lesches, MD   Chief Complaint  Patient presents with  . PSVT    History of Present Illness: Laura Davenport is an 82 y.o. female last seen in August 2016. She presents for a routine follow-up visit. From a cardiac perspective she does not report any sense of palpitations or racing heartbeat. I reviewed her medications and she continues on diltiazem CD 120 mg daily. She has had no dizziness or syncope.  She continues to follow in the hematology/oncology clinic with history of CKD and chronic anemia.  Last ECG and echocardiogram are detailed below.  Past Medical History  Diagnosis Date  . Type 2 diabetes mellitus ([place])   . Essential hypertension   . HLD (hyperlipidemia)   . Depression   . Septic arthritis (HCC)     MRSA, left knee   . GI bleed     Severe gastritis and duodenal ulcers by EGD May 2016   . PSVT (paroxysmal supraventricular tachycardia) (HCC)     Current Outpatient Prescriptions  Medication Sig Dispense Refill  . acetaminophen (TYLENOL) 325 MG tablet Take 650 mg by mouth every 6 (six) hours as needed for mild pain or moderate pain.     Marland Kitchen diltiazem (CARDIZEM CD) 120 MG 24 hr capsule Take 1 capsule (120 mg total) by mouth daily. 30 capsule 0  . pantoprazole (PROTONIX) 40 MG tablet Take 40 mg by mouth daily.     Marland Kitchen losartan (COZAAR) 100 MG tablet Take 100 mg by mouth daily.      No current facility-administered medications for this visit.   Allergies:  Oxycodone   Social History: The patient  reports that she has never smoked. She does not have any smokeless tobacco history on file. She reports that she does not drink alcohol or use illicit drugs.   ROS:  Please see the history of present illness. Otherwise, complete review of systems is positive for chronic fatigue.  All other systems are reviewed and negative.    Physical Exam: VS:  BP 154/82 mmHg  Pulse 98  Ht 5\' 6"  (1.676 m)  Wt 136 lb (61.689 kg)  BMI 21.96 kg/m2  SpO2 95%, BMI Body mass index is 21.96 kg/(m^2).  Wt Readings from Last 3 Encounters:  10/27/15 136 lb (61.689 kg)  10/25/15 138 lb (62.596 kg)  07/31/15 142 lb (64.411 kg)    General: Patient in wheelchair, appears comfortable at rest. HEENT: Conjunctiva and lids normal, oropharynx clear with moist mucosa. Neck: Supple, no elevated JVP or carotid bruits, no thyromegaly. Lungs: Clear to auscultation, nonlabored breathing at rest. Cardiac: Regular rate and rhythm, no S3 or significant systolic murmur. Abdomen: Soft, nontender, bowel sounds present. Extremities: Brace on right leg/knee, distal pulses 2+.  ECG: I personally reviewed the prior tracing from 12/23/2014 which showed sinus tachycardia with PACs and low voltage.  Recent Labwork: 12/23/2014: B Natriuretic Peptide 154.3* 07/31/2015: ALT 10*; AST 16; BUN 30*; Creatinine, Ser 1.67*; Potassium 4.2; Sodium 141; TSH 2.933 10/25/2015: Hemoglobin 8.9*; Platelets 282     Component Value Date/Time   CHOL 76 12/23/2014 0310   TRIG 30 12/23/2014 0310   HDL 30* 12/23/2014 0310   CHOLHDL 2.5 12/23/2014 0310   VLDL 6 12/23/2014 0310   LDLCALC 40 12/23/2014 0310    Other Studies Reviewed Today:  Echocardiogram 12/23/2014: Study Conclusions  - Left ventricle: The cavity  size was normal. Wall thickness was increased in a pattern of moderate LVH. Systolic function was normal. The estimated ejection fraction was in the range of 55% to 60%. Wall motion was normal; there were no regional wall motion abnormalities. Doppler parameters are consistent with abnormal left ventricular relaxation (grade 1 diastolic dysfunction). - Mitral valve: There was mild regurgitation.  Assessment and Plan:  1. History of PSVT, currently quiescent in terms of symptoms. She will continue on present dose of Cardizem CD. This could  always be further advanced for breakthrough symptoms.  2. Essential hypertension. Blood pressure is elevated today. She is also on Cozaar. Keep regular follow-up with Dr. Willey Blade.  Current medicines were reviewed with the patient today.  Disposition: FU with me in 6 months.   Signed, Satira Sark, MD, [facility] 10/27/2015 1:07 PM     [facility] at [facility] [address], [place], [place] [address] Phone: [phone]; Fax: [phone]

## 2015-10-27 NOTE — Patient Instructions (Signed)
Your physician wants you to follow-up in: 6 months with Dr McDowell You will receive a reminder letter in the mail two months in advance. If you don't receive a letter, please call our office to schedule the follow-up appointment.     Your physician recommends that you continue on your current medications as directed. Please refer to the Current Medication list given to you today.    If you need a refill on your cardiac medications before your next appointment, please call your pharmacy.     Thank you for choosing [facility] !        

## 2015-10-30 ENCOUNTER — Encounter (HOSPITAL_COMMUNITY): Payer: Self-pay

## 2015-10-30 ENCOUNTER — Encounter (HOSPITAL_BASED_OUTPATIENT_CLINIC_OR_DEPARTMENT_OTHER): Payer: Medicare Other

## 2015-10-30 VITALS — BP 146/87 | HR 110 | Temp 98.7°F | Resp 18

## 2015-10-30 DIAGNOSIS — D519 Vitamin B12 deficiency anemia, unspecified: Secondary | ICD-10-CM | POA: Diagnosis not present

## 2015-10-30 DIAGNOSIS — S42301A Unspecified fracture of shaft of humerus, right arm, initial encounter for closed fracture: Secondary | ICD-10-CM | POA: Diagnosis not present

## 2015-10-30 DIAGNOSIS — S82002A Unspecified fracture of left patella, initial encounter for closed fracture: Secondary | ICD-10-CM | POA: Diagnosis not present

## 2015-10-30 MED ORDER — CYANOCOBALAMIN 1000 MCG/ML IJ SOLN
INTRAMUSCULAR | Status: AC
  Filled 2015-10-30: qty 1

## 2015-10-30 MED ORDER — CYANOCOBALAMIN 1000 MCG/ML IJ SOLN
1000.0000 ug | INTRAMUSCULAR | Status: AC
  Administered 2015-10-30: 1000 ug via INTRAMUSCULAR

## 2015-10-30 NOTE — Patient Instructions (Signed)
..  [place] at [facility] Discharge Instructions  RECOMMENDATIONS MADE BY THE CONSULTANT AND ANY TEST RESULTS WILL BE SENT TO YOUR REFERRING PHYSICIAN.  Vitamin b12 shot today and weekly for 4 weeks  Thank you for choosing [place] at [facility] to provide your oncology and hematology care.  To afford each patient quality time with our provider, please arrive at least 15 minutes before your scheduled appointment time.   Beginning January 23rd 2017 lab work for the [facility] will be done in the  Main lab at [facility] on 1st floor. If you have a lab appointment with the [facility] please come in thru the  Main Entrance and check in at the main information desk  You need to re-schedule your appointment should you arrive 10 or more minutes late.  We strive to give you quality time with our providers, and arriving late affects you and other patients whose appointments are after yours.  Also, if you no show three or more times for appointments you may be dismissed from the clinic at the providers discretion.     Again, thank you for choosing [facility].  Our hope is that these requests will decrease the amount of time that you wait before being seen by our physicians.       _____________________________________________________________  Should you have questions after your visit to [facility], please contact our office at [phone] between the hours of 8:30 a.m. and 4:30 p.m.  Voicemails left after 4:30 p.m. will not be returned until the following business day.  For prescription refill requests, have your pharmacy contact our office.

## 2015-10-30 NOTE — Progress Notes (Signed)
..  Laura Davenport presents today for injection per the provider's orders.  Vitamin b12 administration without incident; see MAR for injection details.  Patient tolerated procedure well and without incident.  No questions or complaints noted at this time.

## 2015-11-06 ENCOUNTER — Ambulatory Visit (HOSPITAL_COMMUNITY): Payer: Self-pay

## 2015-11-09 ENCOUNTER — Encounter (HOSPITAL_COMMUNITY): Payer: Self-pay | Admitting: Hematology & Oncology

## 2015-11-09 ENCOUNTER — Encounter (HOSPITAL_COMMUNITY): Payer: Medicare Other

## 2015-11-09 ENCOUNTER — Encounter (HOSPITAL_COMMUNITY): Payer: Medicare Other | Attending: Internal Medicine | Admitting: Hematology & Oncology

## 2015-11-09 VITALS — BP 168/78 | HR 96 | Temp 97.6°F | Resp 18

## 2015-11-09 DIAGNOSIS — D649 Anemia, unspecified: Secondary | ICD-10-CM | POA: Diagnosis present

## 2015-11-09 DIAGNOSIS — E538 Deficiency of other specified B group vitamins: Secondary | ICD-10-CM | POA: Diagnosis not present

## 2015-11-09 DIAGNOSIS — N183 Chronic kidney disease, stage 3 (moderate): Secondary | ICD-10-CM | POA: Diagnosis not present

## 2015-11-09 DIAGNOSIS — D519 Vitamin B12 deficiency anemia, unspecified: Secondary | ICD-10-CM

## 2015-11-09 LAB — CBC WITH DIFFERENTIAL/PLATELET
Basophils Absolute: 0 10*3/uL (ref 0.0–0.1)
Basophils Relative: 0 %
Eosinophils Absolute: 0.1 10*3/uL (ref 0.0–0.7)
Eosinophils Relative: 1 %
HCT: 26.6 % — ABNORMAL LOW (ref 36.0–46.0)
Hemoglobin: 9.2 g/dL — ABNORMAL LOW (ref 12.0–15.0)
Lymphocytes Relative: 36 %
Lymphs Abs: 2.9 10*3/uL (ref 0.7–4.0)
MCH: 34.6 pg — ABNORMAL HIGH (ref 26.0–34.0)
MCHC: 34.6 g/dL (ref 30.0–36.0)
MCV: 100 fL (ref 78.0–100.0)
Monocytes Absolute: 0.4 10*3/uL (ref 0.1–1.0)
Monocytes Relative: 6 %
Neutro Abs: 4.5 10*3/uL (ref 1.7–7.7)
Neutrophils Relative %: 57 %
Platelets: 282 10*3/uL (ref 150–400)
RBC: 2.66 MIL/uL — ABNORMAL LOW (ref 3.87–5.11)
RDW: 15.7 % — ABNORMAL HIGH (ref 11.5–15.5)
WBC: 8 10*3/uL (ref 4.0–10.5)

## 2015-11-09 LAB — SAMPLE TO BLOOD BANK

## 2015-11-09 MED ORDER — CYANOCOBALAMIN 1000 MCG/ML IJ SOLN
INTRAMUSCULAR | Status: AC
  Filled 2015-11-09: qty 1

## 2015-11-09 MED ORDER — CYANOCOBALAMIN 1000 MCG/ML IJ SOLN
1000.0000 ug | INTRAMUSCULAR | Status: AC
  Administered 2015-11-09: 1000 ug via INTRAMUSCULAR

## 2015-11-09 NOTE — Progress Notes (Signed)
[place] at [facility] Progress Note  Patient Care Team: Laura Noble, MD as PCP - General (Internal Medicine) Laura Binder, MD as Consulting Physician (Gastroenterology)  CHIEF COMPLAINTS:  Anemia CKD, Stage III Upper endoscopy, EGD, but no colon seen Severe erosive gastritis, moderate duodenitis ESR 138, Polyclonal gammopathy Normal TSH, copper level, B12, folate Erythropoietin level 16.3 mIU/ml Low B12 196 pg/ml  HISTORY OF PRESENTING ILLNESS:   Laura Davenport 82 y.o. female is here because of anemia. She was originally seen back in November and after a PRBC transfusion was lost to follow-up.  She saw Dr. Willey Davenport in the interim who sent her back to [place] for ongoing follow-up. She has B12 deficiency and is on B12 and has been compliant.  Laura Davenport was here with her daughter and presented in a wheelchair.   She is going to get a B12 shot today. She will get a B12 shot once a week for two weeks then she will receive them once a month.  She said that she feels pretty good and that her appetite is getting better and she is eating.   She states that she still has her bad knee and that it is not getting any better. She does not anticipate that it will.  MEDICAL HISTORY:  Past Medical History  Diagnosis Date  . Type 2 diabetes mellitus ([place])   . Essential hypertension   . HLD (hyperlipidemia)   . Depression   . Septic arthritis (HCC)     MRSA, left knee   . GI bleed     Severe gastritis and duodenal ulcers by EGD May 2016   . PSVT (paroxysmal supraventricular tachycardia) ([place])     SURGICAL HISTORY: Past Surgical History  Procedure Laterality Date  . Cholecystectomy N/A 11/04/2012    Procedure: LAPAROSCOPIC CHOLECYSTECTOMY;  Surgeon: Laura So, MD;  Location: [facility];  Service: General;  Laterality: N/A;  . Knee surgery    . I&d extremity Left 12/24/2014    Procedure: IRRIGATION AND DEBRIDEMENT EXTREMITY AND ARTHROSCOPY KNEE;  Surgeon: Laura Butters,  MD;  Location: [place];  Service: Orthopedics;  Laterality: Left;  . Esophagogastroduodenoscopy N/A 01/06/2015    SLF: mild esophagitis  . Savory dilation  01/06/2015    Procedure: SAVORY DILATION;  Surgeon: Laura Binder, MD;  Location: [facility];  Service: Endoscopy;;    SOCIAL HISTORY: Social History   Social History  . Marital Status: Widowed    Spouse Name: N/A  . Number of Children: 38  . Years of Education: N/A   Occupational History  . retired; [place] History Main Topics  . Smoking status: Never Smoker   . Smokeless tobacco: Not on file  . Alcohol Use: No  . Drug Use: No  . Sexual Activity: Not on file   Other Topics Concern  . Not on file   Social History Narrative   Lives alone   3 daughters & granddaughter nearby   Lost 1 son-strep         She had 10 children, but 1 passed away. 9 living. She says she has close to 35 grandchildren Her daughter reports 8 great-grandchildren. Some of her descendants live in [place], one lives in [place], a couple live in [place]; but most are close by. One of her sons lived in [place], and one daughter lived in [place]. She says some of the younger ones went to college, and they all  finished high school.  She is widowed; her husband passed about 15 years ago. She never smoked; she denies any problems with alcohol. 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 She says she "did a little bit of everything." She used to work at [employer] here. 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  She says she doesn't remember her mother or father; her grandmother raised her. She says she never knew them. She also had no brothers and sisters that she knew of, but might have had them. Her grandmother lived to be in her late 3's or 83's; her mother's mother. Her daughter (Laura Davenport's aunt) took care of Laura Davenport after that.  In  terms of hobbies, she says "I like church." She says she/s involved in a lot of church groups. She also likes word puzzles.  FAMILY HISTORY: Family History  Problem Relation Age of Onset  . Family history unknown: Yes   She says she doesn't remember her mother or father; her grandmother raised her. She says she never knew them. She also had no brothers and sisters that she knew of, but might have had them. Her grandmother lived to be in her late 40's or 7's; her mother's mother. Her daughter (Laura Davenport's aunt) took care of Laura Davenport after that.  indicated that her mother is deceased. She indicated that her father is deceased.   ALLERGIES:  is allergic to oxycodone.  MEDICATIONS:  Current Outpatient Prescriptions  Medication Sig Dispense Refill  . acetaminophen (TYLENOL) 325 MG tablet Take 650 mg by mouth every 6 (six) hours as needed for mild pain or moderate pain.     Marland Kitchen diltiazem (CARDIZEM CD) 120 MG 24 hr capsule Take 1 capsule (120 mg total) by mouth daily. 30 capsule 0  . losartan (COZAAR) 100 MG tablet Take 100 mg by mouth daily.     . pantoprazole (PROTONIX) 40 MG tablet Take 40 mg by mouth daily.      No current facility-administered medications for this visit.    Review of Systems  Constitutional: Negative.   HENT: Negative.   Eyes: Negative.   Respiratory: Negative.   Cardiovascular: Negative.   Gastrointestinal: Negative.   Genitourinary: Negative.   Musculoskeletal: Positive for joint pain.       Pain in the injured left knee. 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  Skin: Negative.   Endo/Heme/Allergies: Negative.   Psychiatric/Behavioral: Negative.   All other systems reviewed and are negative.  14 point ROS was done and is otherwise as detailed above or in HPI  PHYSICAL EXAMINATION: ECOG PERFORMANCE STATUS: 2 - Symptomatic, <50% confined to bed  Filed Vitals:   11/09/15 0928  BP: 168/78  Pulse: 96  Temp: 97.6 F (36.4 C)  Resp: 18    There were no vitals filed for this visit. Physical Exam  Constitutional: She is oriented to person, place, and time and well-developed, well-nourished, and in no distress.  Exam performed in wheelchair because she cannot ambulate to the table. Wears glasses.   HENT:  Head: Normocephalic and atraumatic.  Mouth/Throat: Oropharynx is clear and moist.  Eyes: Conjunctivae and EOM are normal. Pupils are equal, round, and reactive to light.  Neck: Normal range of motion. Neck supple. No tracheal deviation present. No thyromegaly present.  Cardiovascular: Normal rate, regular rhythm and normal heart sounds.   Pulmonary/Chest: Effort normal and breath sounds normal.  Abdominal: Soft. Bowel sounds  are normal. She exhibits no distension and no mass. There is no tenderness. There is no rebound and no guarding.  Musculoskeletal: She exhibits no edema.  Left knee injury; surgical incision site well healed. Her joint is frozen with little extension. knee swollen  Lymphadenopathy:    She has no cervical adenopathy.  Neurological: She is alert and oriented to person, place, and time. No cranial nerve deficit.  Skin: Skin is warm and dry.  Psychiatric: Mood, memory, affect and judgment normal.  Nursing note and vitals reviewed.   LABORATORY DATA:  I have reviewed the data as listed Lab Results  Component Value Date   WBC 8.0 11/09/2015   HGB 9.2* 11/09/2015   HCT 26.6* 11/09/2015   MCV 100.0 11/09/2015   PLT 282 11/09/2015   CMP     Component Value Date/Time   NA 141 07/31/2015 1500   K 4.2 07/31/2015 1500   CL 113* 07/31/2015 1500   CO2 24 07/31/2015 1500   GLUCOSE 84 07/31/2015 1500   BUN 30* 07/31/2015 1500   CREATININE 1.67* 07/31/2015 1500   CALCIUM 8.1* 07/31/2015 1500   PROT 7.5 07/31/2015 1500   ALBUMIN 2.1* 07/31/2015 1500   AST 16 07/31/2015 1500   ALT 10* 07/31/2015 1500   ALKPHOS 69 07/31/2015 1500   BILITOT 0.4 07/31/2015 1500   GFRNONAA 27* 07/31/2015 1500   GFRAA  32* 07/31/2015 1500     ASSESSMENT & PLAN:  Anemia CKD, Stage III Upper endoscopy, EGD, but no colon seen Severe erosive gastritis, moderate duodenitis ESR 138, Polyclonal gammopathy, elevated CRP Normal RF, normal CCP antibodies Normal TSH, copper level, B12, folate Erythropoietin level 16.3 mIU/ml B12 deficiency 196 pg/ml   I discussed anemia with the patient and her daughter, potential causes including CKD, MDS and nutritional deficiencies. We reviewed the laboratory studies from her visit with [place] last year. She has B12 deficiency and I have recommended B12 replacement, she is currently on B12 replacement and will continue as prescribed.   SED rate was markedly elevated at 138. She has a severe anemia. Hemoccults have not been recently done and these will be certainly be needed.   We discussed BMBX at her last visit and she is agreeable however we have decided to hold off until she has completed her weekly B12 therapy and counts are re-evaluated. If improved we will hold off and pursue further evaluation of her ESR and pursue hemoccults.  She will return in 4 weeks for a follow up and to discuss lab results.   Orders Placed This Encounter  Procedures  . CBC with Differential    Standing Status: Future     Number of Occurrences: 1     Standing Expiration Date: 11/08/2016  . CBC with Differential    Standing Status: Future     Number of Occurrences:      Standing Expiration Date: 11/08/2016  . Vitamin B12    Standing Status: Future     Number of Occurrences:      Standing Expiration Date: 11/08/2016  . Folate    Standing Status: Future     Number of Occurrences:      Standing Expiration Date: 11/08/2016  . Sample to Blood Bank    Standing Status: Future     Number of Occurrences: 1     Standing Expiration Date: 11/08/2016   All questions were answered. The patient knows to call the clinic with any problems, questions or concerns.  This document serves as a record  of services  personally performed by Ancil Linsey, MD. It was created on her behalf by Kandace Blitz, a trained medical scribe. The creation of this record is based on the scribe's personal observations and the provider's statements to them. This document has been checked and approved by the attending provider.  I have reviewed the above documentation for accuracy and completeness, and I agree with the above.  This note was electronically signed.    Molli Hazard, MD  11/09/2015 10:26 AM

## 2015-11-09 NOTE — Patient Instructions (Addendum)
[place] at [facility] Discharge Instructions  RECOMMENDATIONS MADE BY THE CONSULTANT AND ANY TEST RESULTS WILL BE SENT TO YOUR REFERRING PHYSICIAN.   Exam and discussion by Dr Whitney Muse today Labs today B12 today B12 weekly x2 more then monthly   We are going to get your B12 replaced first before we do a BMBX Return to see the doctor in 4 weeks Please call the clinic if you have any questions or concerns     Thank you for choosing [place] at [facility] to provide your oncology and hematology care.  To afford each patient quality time with our provider, please arrive at least 15 minutes before your scheduled appointment time.   Beginning January 23rd 2017 lab work for the [facility] will be done in the  Main lab at [facility] on 1st floor. If you have a lab appointment with the [place] please come in thru the  Main Entrance and check in at the main information desk  You need to re-schedule your appointment should you arrive 10 or more minutes late.  We strive to give you quality time with our providers, and arriving late affects you and other patients whose appointments are after yours.  Also, if you no show three or more times for appointments you may be dismissed from the clinic at the providers discretion.     Again, thank you for choosing [facility].  Our hope is that these requests will decrease the amount of time that you wait before being seen by our physicians.       _____________________________________________________________  Should you have questions after your visit to [facility], please contact our office at [phone] between the hours of 8:30 a.m. and 4:30 p.m.  Voicemails left after 4:30 p.m. will not be returned until the following business day.  For prescription refill requests, have your pharmacy contact our office.         Resources For Cancer Patients and their  Caregivers ? American Cancer Society: Can assist with transportation, wigs, general needs, runs Look Good Feel Better.        207-366-1576 ? Cancer Care: Provides financial assistance, online support groups, medication/co-pay assistance.  1-800-813-HOPE 360 658 2654) ? [place] Assists [place] cancer patients and their families through emotional , educational and financial support.  ([phone] 067-6783 ? [place] DSS Where to apply for food stamps, Medicaid and utility assistance. 925-868-1234 ? RCATS: Transportation to medical appointments. 775-600-9253 ? Social Security Administration: May apply for disability if have a Stage IV cancer. [phone] ? [employer], Disability and Transit Services: Assists with nutrition, care and transit needs. [phone]

## 2015-11-09 NOTE — Progress Notes (Signed)
..  Laura Davenport presents today for injection per the provider's orders.  Vitamin b12 administration without incident; see MAR for injection details.  Patient tolerated procedure well and without incident.  No questions or complaints noted at this time.

## 2015-11-10 DIAGNOSIS — Z79899 Other long term (current) drug therapy: Secondary | ICD-10-CM | POA: Diagnosis not present

## 2015-11-10 DIAGNOSIS — I10 Essential (primary) hypertension: Secondary | ICD-10-CM | POA: Diagnosis not present

## 2015-11-10 DIAGNOSIS — N183 Chronic kidney disease, stage 3 (moderate): Secondary | ICD-10-CM | POA: Diagnosis not present

## 2015-11-10 DIAGNOSIS — E119 Type 2 diabetes mellitus without complications: Secondary | ICD-10-CM | POA: Diagnosis not present

## 2015-11-10 DIAGNOSIS — I482 Chronic atrial fibrillation: Secondary | ICD-10-CM | POA: Diagnosis not present

## 2015-11-13 ENCOUNTER — Ambulatory Visit (HOSPITAL_COMMUNITY): Payer: Self-pay

## 2015-11-14 DIAGNOSIS — E46 Unspecified protein-calorie malnutrition: Secondary | ICD-10-CM | POA: Diagnosis not present

## 2015-11-14 DIAGNOSIS — L500 Allergic urticaria: Secondary | ICD-10-CM | POA: Diagnosis not present

## 2015-11-14 DIAGNOSIS — D539 Nutritional anemia, unspecified: Secondary | ICD-10-CM | POA: Diagnosis not present

## 2015-11-14 DIAGNOSIS — E119 Type 2 diabetes mellitus without complications: Secondary | ICD-10-CM | POA: Diagnosis not present

## 2015-11-16 ENCOUNTER — Encounter (HOSPITAL_BASED_OUTPATIENT_CLINIC_OR_DEPARTMENT_OTHER): Payer: Medicare Other

## 2015-11-16 ENCOUNTER — Encounter (HOSPITAL_COMMUNITY): Payer: Self-pay

## 2015-11-16 VITALS — BP 144/86 | HR 95 | Temp 98.7°F | Resp 18

## 2015-11-16 DIAGNOSIS — D519 Vitamin B12 deficiency anemia, unspecified: Secondary | ICD-10-CM | POA: Diagnosis not present

## 2015-11-16 MED ORDER — CYANOCOBALAMIN 1000 MCG/ML IJ SOLN
1000.0000 ug | INTRAMUSCULAR | Status: AC
  Administered 2015-11-16: 1000 ug via INTRAMUSCULAR

## 2015-11-16 MED ORDER — CYANOCOBALAMIN 1000 MCG/ML IJ SOLN
INTRAMUSCULAR | Status: AC
  Filled 2015-11-16: qty 1

## 2015-11-16 NOTE — Progress Notes (Signed)
Margaretha Seeds presents today for injection per the provider's orders.  Vitamin b12 administration without incident; see MAR for injection details.  Patient tolerated procedure well and without incident.  No questions or complaints noted at this time.

## 2015-11-20 ENCOUNTER — Ambulatory Visit (HOSPITAL_COMMUNITY): Payer: Self-pay

## 2015-11-21 DIAGNOSIS — H5203 Hypermetropia, bilateral: Secondary | ICD-10-CM | POA: Diagnosis not present

## 2015-11-21 DIAGNOSIS — H2513 Age-related nuclear cataract, bilateral: Secondary | ICD-10-CM | POA: Diagnosis not present

## 2015-11-21 DIAGNOSIS — H52222 Regular astigmatism, left eye: Secondary | ICD-10-CM | POA: Diagnosis not present

## 2015-11-21 DIAGNOSIS — H524 Presbyopia: Secondary | ICD-10-CM | POA: Diagnosis not present

## 2015-11-23 ENCOUNTER — Encounter (HOSPITAL_BASED_OUTPATIENT_CLINIC_OR_DEPARTMENT_OTHER): Payer: Medicare Other

## 2015-11-23 VITALS — BP 159/80 | HR 81 | Temp 98.9°F | Resp 16

## 2015-11-23 DIAGNOSIS — D519 Vitamin B12 deficiency anemia, unspecified: Secondary | ICD-10-CM

## 2015-11-23 MED ORDER — CYANOCOBALAMIN 1000 MCG/ML IJ SOLN
1000.0000 ug | Freq: Once | INTRAMUSCULAR | Status: AC
  Administered 2015-11-23: 1000 ug via INTRAMUSCULAR
  Filled 2015-11-23: qty 1

## 2015-11-23 NOTE — Patient Instructions (Signed)
[place] at [facility] Discharge Instructions  RECOMMENDATIONS MADE BY THE CONSULTANT AND ANY TEST RESULTS WILL BE SENT TO YOUR REFERRING PHYSICIAN.  Vitamin B12 1000 mcg injection given as ordered. Return monthly for B12 injection.  Thank you for choosing [place] at [facility] to provide your oncology and hematology care.  To afford each patient quality time with our provider, please arrive at least 15 minutes before your scheduled appointment time.   Beginning January 23rd 2017 lab work for the [facility] will be done in the  Main lab at [facility] on 1st floor. If you have a lab appointment with the [place] please come in thru the  Main Entrance and check in at the main information desk  You need to re-schedule your appointment should you arrive 10 or more minutes late.  We strive to give you quality time with our providers, and arriving late affects you and other patients whose appointments are after yours.  Also, if you no show three or more times for appointments you may be dismissed from the clinic at the providers discretion.     Again, thank you for choosing [facility].  Our hope is that these requests will decrease the amount of time that you wait before being seen by our physicians.       _____________________________________________________________  Should you have questions after your visit to [facility] please contact our office at [phone] between the hours of 8:30 a.m. and 4:30 p.m.  Voicemails left after 4:30 p.m. will not be returned until the following business day.  For prescription refill requests, have your pharmacy contact our office.         Resources For Cancer Patients and their Caregivers ? American Cancer Society: Can assist with transportation, wigs, general needs, runs Look Good Feel Better.        512-538-2060 ? Cancer Care: Provides financial assistance,  online support groups, medication/co-pay assistance.  1-800-813-HOPE 937-267-4736) ? [place] Assists [place] cancer patients and their families through emotional , educational and financial support.  450-086-1215 ? [place] DSS Where to apply for food stamps, Medicaid and utility assistance. 4424159659 ? RCATS: Transportation to medical appointments. 858-556-7959 ? Social Security Administration: May apply for disability if have a Stage IV cancer. ([phone] 309-386-7247 ? [employer], Disability and Transit Services: Assists with nutrition, care and transit needs. [phone]

## 2015-11-23 NOTE — Progress Notes (Signed)
Margaretha Seeds presents today for injection per MD orders. B12 1000 mcg administered IM in right Upper Arm. Administration without incident. Patient tolerated well.

## 2015-11-27 DIAGNOSIS — S82002A Unspecified fracture of left patella, initial encounter for closed fracture: Secondary | ICD-10-CM | POA: Diagnosis not present

## 2015-11-27 DIAGNOSIS — S42301A Unspecified fracture of shaft of humerus, right arm, initial encounter for closed fracture: Secondary | ICD-10-CM | POA: Diagnosis not present

## 2015-12-08 ENCOUNTER — Encounter (HOSPITAL_COMMUNITY): Payer: Self-pay | Admitting: Hematology & Oncology

## 2015-12-08 ENCOUNTER — Encounter (HOSPITAL_COMMUNITY): Payer: Medicare Other

## 2015-12-08 ENCOUNTER — Encounter (HOSPITAL_COMMUNITY): Payer: Medicare Other | Attending: Internal Medicine | Admitting: Hematology & Oncology

## 2015-12-08 VITALS — BP 156/93 | HR 96 | Temp 97.9°F | Resp 18

## 2015-12-08 DIAGNOSIS — N183 Chronic kidney disease, stage 3 (moderate): Secondary | ICD-10-CM | POA: Diagnosis not present

## 2015-12-08 DIAGNOSIS — D649 Anemia, unspecified: Secondary | ICD-10-CM

## 2015-12-08 DIAGNOSIS — D638 Anemia in other chronic diseases classified elsewhere: Secondary | ICD-10-CM

## 2015-12-08 DIAGNOSIS — E538 Deficiency of other specified B group vitamins: Secondary | ICD-10-CM

## 2015-12-08 DIAGNOSIS — N184 Chronic kidney disease, stage 4 (severe): Secondary | ICD-10-CM

## 2015-12-08 DIAGNOSIS — D519 Vitamin B12 deficiency anemia, unspecified: Secondary | ICD-10-CM

## 2015-12-08 LAB — CBC WITH DIFFERENTIAL/PLATELET
Basophils Absolute: 0 10*3/uL (ref 0.0–0.1)
Basophils Relative: 0 %
Eosinophils Absolute: 0.1 10*3/uL (ref 0.0–0.7)
Eosinophils Relative: 2 %
HCT: 26.6 % — ABNORMAL LOW (ref 36.0–46.0)
Hemoglobin: 9.6 g/dL — ABNORMAL LOW (ref 12.0–15.0)
Lymphocytes Relative: 42 %
Lymphs Abs: 2.9 10*3/uL (ref 0.7–4.0)
MCH: 36 pg — ABNORMAL HIGH (ref 26.0–34.0)
MCHC: 36.1 g/dL — ABNORMAL HIGH (ref 30.0–36.0)
MCV: 99.6 fL (ref 78.0–100.0)
Monocytes Absolute: 0.4 10*3/uL (ref 0.1–1.0)
Monocytes Relative: 6 %
Neutro Abs: 3.4 10*3/uL (ref 1.7–7.7)
Neutrophils Relative %: 50 %
Platelets: 265 10*3/uL (ref 150–400)
RBC: 2.67 MIL/uL — ABNORMAL LOW (ref 3.87–5.11)
RDW: 14 % (ref 11.5–15.5)
WBC: 6.9 10*3/uL (ref 4.0–10.5)

## 2015-12-08 LAB — FOLATE: Folate: 16.5 ng/mL (ref >5.9)

## 2015-12-08 LAB — VITAMIN B12: Vitamin B-12: 573 pg/mL (ref 180–914)

## 2015-12-08 MED ORDER — CYANOCOBALAMIN 1000 MCG/ML IJ SOLN: 1000.0000 ug | INTRAMUSCULAR | Status: DC

## 2015-12-08 NOTE — Patient Instructions (Signed)
[place] at [facility] Discharge Instructions  RECOMMENDATIONS MADE BY THE CONSULTANT AND ANY TEST RESULTS WILL BE SENT TO YOUR REFERRING PHYSICIAN.  Exam done and seen today by Dr. Whitney Muse Will send B12 and syringe prescriptions to pharmacy. Give this monthly at the same time each month. Return to see the doctor in 6 weeks for labs too with follow up. Please call the clinic if you have any questions or concerns   Thank you for choosing [place] at [facility] to provide your oncology and hematology care.  To afford each patient quality time with our provider, please arrive at least 15 minutes before your scheduled appointment time.   Beginning January 23rd 2017 lab work for the [facility] will be done in the  Main lab at [facility] on 1st floor. If you have a lab appointment with the [place] please come in thru the  Main Entrance and check in at the main information desk  You need to re-schedule your appointment should you arrive 10 or more minutes late.  We strive to give you quality time with our providers, and arriving late affects you and other patients whose appointments are after yours.  Also, if you no show three or more times for appointments you may be dismissed from the clinic at the providers discretion.     Again, thank you for choosing [facility].  Our hope is that these requests will decrease the amount of time that you wait before being seen by our physicians.       _____________________________________________________________  Should you have questions after your visit to [facility], please contact our office at [phone] between the hours of 8:30 a.m. and 4:30 p.m.  Voicemails left after 4:30 p.m. will not be returned until the following business day.  For prescription refill requests, have your pharmacy contact our office.         Resources For Cancer Patients and their  Caregivers ? American Cancer Society: Can assist with transportation, wigs, general needs, runs Look Good Feel Better.        231-543-1337 ? Cancer Care: Provides financial assistance, online support groups, medication/co-pay assistance.  1-800-813-HOPE 915 286 9208) ? [place] Assists [place] cancer patients and their families through emotional , educational and financial support.  978-563-4994 ? [place] DSS Where to apply for food stamps, Medicaid and utility assistance. 832 756 0963 ? RCATS: Transportation to medical appointments. (725)517-9476 ? Social Security Administration: May apply for disability if have a Stage IV cancer. (435)[phone] [phone]) [phone] ? [employer], Disability and Transit Services: Assists with nutrition, care and transit needs. [phone]

## 2015-12-08 NOTE — Progress Notes (Signed)
[place] at [facility] Progress Note  Patient Care Team: [name], MD as PCP - General (Internal Medicine) Danie Binder, MD as Consulting Physician (Gastroenterology)  CHIEF COMPLAINTS:  Anemia CKD, Stage III Upper endoscopy, EGD, but no colon seen Severe erosive gastritis, moderate duodenitis ESR 138, Polyclonal gammopathy Normal TSH, copper level, B12, folate Erythropoietin level 16.3 mIU/ml Low B12 196 pg/ml  HISTORY OF PRESENTING ILLNESS:   Laura Davenport 82 y.o. female is here because of anemia. She was originally seen back in November and after a PRBC transfusion was lost to follow-up.  She saw Dr. Willey Blade in the interim who sent her back to [place] for ongoing follow-up. She has B12 deficiency and is on B12 and has been compliant.  Laura Davenport returns to the [facility] today accompanied by her daughter.  She is in a wheelchair. Her blood counts are better, holding in the 9 range. She was advised that her kidney function is also contributing to her blood counts, but that the B12 has definitely helped.  Overall, improvements have been made, and both Laura Davenport and her daughter are relieved by this.  Laura Davenport confirms eating and says she's gained about two pounds since the last time. Laura Davenport says "you should have seen me six months ago!" Her daughter notes that before she broke her knee, she was about 200 pounds. Her daughter states that her blood sugar levelled off and "a lot of things were taken care of" by her losing the 70 pounds.   MEDICAL HISTORY:  Past Medical History  Diagnosis Date  . Type 2 diabetes mellitus ([place])   . Essential hypertension   . HLD (hyperlipidemia)   . Depression   . Septic arthritis (HCC)     MRSA, left knee   . GI bleed     Severe gastritis and duodenal ulcers by EGD May 2016   . PSVT (paroxysmal supraventricular tachycardia) ([place])     SURGICAL HISTORY: Past Surgical History  Procedure Laterality Date  .  Cholecystectomy N/A 11/04/2012    Procedure: LAPAROSCOPIC CHOLECYSTECTOMY;  Surgeon: Jamesetta So, MD;  Location: [facility];  Service: General;  Laterality: N/A;  . Knee surgery    . I&d extremity Left 12/24/2014    Procedure: IRRIGATION AND DEBRIDEMENT EXTREMITY AND ARTHROSCOPY KNEE;  Surgeon: Renette Butters, MD;  Location: [place];  Service: Orthopedics;  Laterality: Left;  . Esophagogastroduodenoscopy N/A 01/06/2015    SLF: mild esophagitis  . Savory dilation  01/06/2015    Procedure: SAVORY DILATION;  Surgeon: Danie Binder, MD;  [facility];  Service: Endoscopy;;    SOCIAL HISTORY: Social History   Social History  . Marital Status: Widowed    Spouse Name: N/A  . Number of Children: 42  . Years of Education: N/A   Occupational History  . retired; [place] History Main Topics  . Smoking status: Never Smoker   . Smokeless tobacco: Not on file  . Alcohol Use: No  . Drug Use: No  . Sexual Activity: Not on file   Other Topics Concern  . Not on file   Social History Narrative   Lives alone   3 daughters & granddaughter nearby   Lost 1 son-strep         She had 10 children, but 1 passed away. 9 living. She says she has close to 35 grandchildren Her daughter reports 8 great-grandchildren. Some of her descendants live  in [place], one lives in [place], a couple live in [place]; but most are close by. One of her sons lived in [place], and one daughter lived in [place]. She says some of the younger ones went to college, and they all finished high school.  She is widowed; her husband passed about 15 years ago. She never smoked; she denies any problems with alcohol. 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 She says she "did a little bit of everything." She used to work at [employer] here. 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  She says she doesn't remember her  mother or father; her grandmother raised her. She says she never knew them. She also had no brothers and sisters that she knew of, but might have had them. Her grandmother lived to be in her late 71's or 55's; her mother's mother. Her daughter (Laura Davenport's aunt) took care of Laura Davenport after that.  In terms of hobbies, she says "I like church." She says she/s involved in a lot of church groups. She also likes word puzzles.  FAMILY HISTORY: Family History  Problem Relation Age of Onset  . Family history unknown: Yes   She says she doesn't remember her mother or father; her grandmother raised her. She says she never knew them. She also had no brothers and sisters that she knew of, but might have had them. Her grandmother lived to be in her late 19's or 56's; her mother's mother. Her daughter (Laura Davenport's aunt) took care of Laura Davenport after that.  indicated that her mother is deceased. She indicated that her father is deceased.   ALLERGIES:  is allergic to oxycodone.  MEDICATIONS:  Current Outpatient Prescriptions  Medication Sig Dispense Refill  . acetaminophen (TYLENOL) 325 MG tablet Take 650 mg by mouth every 6 (six) hours as needed for mild pain or moderate pain.     . cyanocobalamin (,VITAMIN B-12,) 1000 MCG/ML injection Inject 1,000 mcg into the muscle every 30 (thirty) days.    Marland Kitchen diltiazem (CARDIZEM CD) 120 MG 24 hr capsule Take 1 capsule (120 mg total) by mouth daily. 30 capsule 0  . losartan (COZAAR) 100 MG tablet Take 100 mg by mouth daily.     . pantoprazole (PROTONIX) 40 MG tablet Take 40 mg by mouth daily.      No current facility-administered medications for this visit.    Review of Systems  Constitutional: Negative.   HENT: Negative.   Eyes: Negative.   Respiratory: Negative.   Cardiovascular: Negative.   Gastrointestinal: Negative.   Genitourinary: Negative.   Musculoskeletal: Positive for joint pain.       Pain in the injured left knee. 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  Skin: Negative.   Endo/Heme/Allergies: Negative.   Psychiatric/Behavioral: Negative.   All other systems reviewed and are negative.  14 point ROS was done and is otherwise as detailed above or in HPI   PHYSICAL EXAMINATION: ECOG PERFORMANCE STATUS: 2 - Symptomatic, <50% confined to bed  Filed Vitals:   12/08/15 1010  BP: 156/93  Pulse: 96  Temp: 97.9 F (36.6 C)  Resp: 18   There were no vitals filed for this visit. Physical Exam  Constitutional: She is oriented to person, place, and time and well-developed, well-nourished, and in no distress.  Exam performed in wheelchair because she cannot ambulate to the table. Wears glasses.   HENT:  Head: Normocephalic and atraumatic.  Mouth/Throat: Oropharynx is clear and moist.  Eyes: Conjunctivae and EOM are normal. Pupils are equal, round, and reactive to light.  Neck: Normal range of motion. Neck supple. No tracheal deviation present. No thyromegaly present.  Cardiovascular: Normal rate, regular rhythm and normal heart sounds.   Pulmonary/Chest: Effort normal and breath sounds normal.  Abdominal: Soft. Bowel sounds are normal. She exhibits no distension and no mass. There is no tenderness. There is no rebound and no guarding.  Musculoskeletal: She exhibits no edema.  Left knee injury; surgical incision site well healed. Her joint is frozen with little extension. knee swollen  Lymphadenopathy:    She has no cervical adenopathy.  Neurological: She is alert and oriented to person, place, and time. No cranial nerve deficit.  Skin: Skin is warm and dry.  Psychiatric: Mood, memory, affect and judgment normal.  Nursing note and vitals reviewed.   LABORATORY DATA:  I have reviewed the data as listed Lab Results  Component Value Date   WBC 6.9 12/08/2015   HGB 9.6* 12/08/2015   HCT 26.6* 12/08/2015   MCV 99.6 12/08/2015   PLT 265 12/08/2015   CMP     Component Value Date/Time   NA 141  07/31/2015 1500   K 4.2 07/31/2015 1500   CL 113* 07/31/2015 1500   CO2 24 07/31/2015 1500   GLUCOSE 84 07/31/2015 1500   BUN 30* 07/31/2015 1500   CREATININE 1.67* 07/31/2015 1500   CALCIUM 8.1* 07/31/2015 1500   PROT 7.5 07/31/2015 1500   ALBUMIN 2.1* 07/31/2015 1500   AST 16 07/31/2015 1500   ALT 10* 07/31/2015 1500   ALKPHOS 69 07/31/2015 1500   BILITOT 0.4 07/31/2015 1500   GFRNONAA 27* 07/31/2015 1500   GFRAA 32* 07/31/2015 1500      ASSESSMENT & PLAN:  Anemia CKD, Stage III Upper endoscopy, EGD, but no colon seen Severe erosive gastritis, moderate duodenitis ESR 138, Polyclonal gammopathy, elevated CRP Normal RF, normal CCP antibodies Normal TSH, copper level, B12, folate Erythropoietin level 16.3 mIU/ml B12 deficiency 196 pg/ml   She has B12 deficiency and I have recommended B12 replacement, she is currently on B12 replacement and will continue as prescribed. If her anemia worsens I would consider aranesp next given her severe CKD. She has not had a BMBX.  SED rate was markedly elevated at 138. We will repeat this in the future. Uncertain of cause.   She has a daughter and granddaughter who can do B12 injections. She will need syringes as well.  We will see her back in 6 weeks to see how she is doing and make additional decisions at that time.  Orders Placed This Encounter  Procedures  . CBC with Differential    Standing Status: Future     Number of Occurrences:      Standing Expiration Date: 12/07/2016  . Comprehensive metabolic panel    Standing Status: Future     Number of Occurrences:      Standing Expiration Date: 12/07/2016   All questions were answered. The patient knows to call the clinic with any problems, questions or concerns.  This document serves as a record of services personally performed by Ancil Linsey, MD. It was created on her behalf by Toni Amend, a trained medical scribe. The creation of this record is based on the scribe's  personal observations and the provider's statements to them. This document has been checked and approved by the attending provider.  I have reviewed the  above documentation for accuracy and completeness, and I agree with the above.  This note was electronically signed.    Molli Hazard, MD  12/08/2015 11:04 AM

## 2015-12-20 ENCOUNTER — Ambulatory Visit (INDEPENDENT_AMBULATORY_CARE_PROVIDER_SITE_OTHER): Payer: Medicare Other | Admitting: Nurse Practitioner

## 2015-12-20 ENCOUNTER — Encounter: Payer: Self-pay | Admitting: Nurse Practitioner

## 2015-12-20 ENCOUNTER — Telehealth: Payer: Self-pay | Admitting: Nurse Practitioner

## 2015-12-20 VITALS — BP 164/82 | HR 91 | Temp 96.4°F | Ht 66.5 in | Wt 137.4 lb

## 2015-12-20 DIAGNOSIS — R1310 Dysphagia, unspecified: Secondary | ICD-10-CM | POA: Diagnosis not present

## 2015-12-20 DIAGNOSIS — K219 Gastro-esophageal reflux disease without esophagitis: Secondary | ICD-10-CM

## 2015-12-20 NOTE — Progress Notes (Signed)
CC'ED TO PCP 

## 2015-12-20 NOTE — Telephone Encounter (Signed)
Pt's daughter called back and is aware about the Protonix and said to please send in a prescription to [facility]. She is aware that Randall Hiss has left for the day and will send it in tomorrow.

## 2015-12-20 NOTE — Progress Notes (Signed)
Referring Provider: [name], MD Primary Care Physician:  [name], MD Primary GI:  Dr. Oneida Alar  Chief Complaint  Patient presents with  . Follow-up    Swallowing better    HPI:   Laura Davenport is a 82 y.o. female who presents for follow-up on dysphagia. She was last seen in our office 06/21/2015 for the same. At that time she states she is doing better with improvement in dysphagia symptoms and eating approximately 75% of her meals. Was on Protonix at that time with no GERD symptoms. She was eating dysphagia diet exploring foods which met criteria for her diet that she enjoys, family noted improved energy level. At that time recommended 6 month follow-up for longer-term evaluation.  Today she states she's doing well overall. No further dysphagia symptoms. Tolerating diet well. Eats between 75%-100% of meals. Still seeing PT for knee problems. Denies GERD symptoms. States her PCP stopped her Protonix. Denies abdominal pain, N/V, hematochezia, melena. Denies chest pain, dyspnea, dizziness, lightheadedness, syncope, near syncope. Denies any other upper or lower GI symptoms.  Past Medical History  Diagnosis Date  . Type 2 diabetes mellitus ([place])   . Essential hypertension   . HLD (hyperlipidemia)   . Depression   . Septic arthritis (HCC)     MRSA, left knee   . GI bleed     Severe gastritis and duodenal ulcers by EGD May 2016   . PSVT (paroxysmal supraventricular tachycardia) [facility])     Past Surgical History  Procedure Laterality Date  . Cholecystectomy N/A 11/04/2012    Procedure: LAPAROSCOPIC CHOLECYSTECTOMY;  Surgeon: Jamesetta So, MD;  Location: [facility];  Service: General;  Laterality: N/A;  . Knee surgery    . I&d extremity Left 12/24/2014    Procedure: IRRIGATION AND DEBRIDEMENT EXTREMITY AND ARTHROSCOPY KNEE;  Surgeon: Renette Butters, MD;  Location: [place];  Service: Orthopedics;  Laterality: Left;  . Esophagogastroduodenoscopy N/A 01/06/2015    SLF: mild esophagitis  .  Savory dilation  01/06/2015    Procedure: SAVORY DILATION;  Surgeon: Danie Binder, MD;  [facility];  Service: Endoscopy;;    Current Outpatient Prescriptions  Medication Sig Dispense Refill  . acetaminophen (TYLENOL) 325 MG tablet Take 650 mg by mouth every 6 (six) hours as needed for mild pain or moderate pain.     . cyanocobalamin (,VITAMIN B-12,) 1000 MCG/ML injection Inject 1 mL (1,000 mcg total) into the skin every 30 (thirty) days. 1 mL 11  . losartan (COZAAR) 100 MG tablet Take 100 mg by mouth daily.      No current facility-administered medications for this visit.    Allergies as of 12/20/2015 - Review Complete 12/20/2015  Allergen Reaction Noted  . Oxycodone Hives 12/30/2014    Family History  Problem Relation Age of Onset  . Family history unknown: Yes    Social History   Social History  . Marital Status: Widowed    Spouse Name: N/A  . Number of Children: 28  . Years of Education: N/A   Occupational History  . retired; [place] History Main Topics  . Smoking status: Never Smoker   . Smokeless tobacco: None     Comment: Never smoked  . Alcohol Use: No  . Drug Use: No  . Sexual Activity: Not Asked   Other Topics Concern  . None   Social History Narrative   Lives alone   3 daughters & granddaughter nearby   Lost  1 son-strep          Review of Systems: 10-point ROS negative except as per HPI.    Physical Exam: BP 164/82 mmHg  Pulse 91  Temp(Src) 96.4 F (35.8 C) (Oral)  Ht 5' 6.5" (1.689 m)  Wt 137 lb 6.4 oz (62.324 kg)  BMI 21.85 kg/m2 General:   Alert and oriented. Pleasant and cooperative. Well-nourished and well-developed.  Head:  Normocephalic and atraumatic. Eyes:  Without icterus, sclera clear and conjunctiva pink.  Ears:  Normal auditory acuity. Cardiovascular:  S1, S2 present without murmurs appreciated.Extremities without clubbing or edema. Respiratory:  Clear to auscultation bilaterally. No wheezes,  rales, or rhonchi. No distress.  Gastrointestinal:  +BS, soft, non-tender and non-distended. No HSM noted. No guarding or rebound. No masses appreciated.  Rectal:  Deferred  Neurologic:  Alert and oriented x4;  grossly normal neurologically. Psych:  Alert and cooperative. Normal mood and affect. Heme/Lymph/Immune: No excessive bruising noted.    12/20/2015 10:43 AM   Disclaimer: This note was dictated with voice recognition software. Similar sounding words can inadvertently be transcribed and may not be corrected upon review.

## 2015-12-20 NOTE — Telephone Encounter (Signed)
[facility] @ [phone] for a return call from the daughter.

## 2015-12-20 NOTE — Telephone Encounter (Signed)
Please notify patient's daughter that I spoke with her mom's PCP and he states he did not intend to stop her Protonix. Recommend she keep taking it. Please ask if she still has Protonix available. If not, let me know and I will send in a refill.

## 2015-12-20 NOTE — Patient Instructions (Signed)
1. Continue enjoying foods that she can swallow without difficulty. 2. Return for follow-up if you need [place].

## 2015-12-20 NOTE — Assessment & Plan Note (Signed)
Dysphagia symptoms have resolved. Continues on a soft diet and eating 75-100% of her meals. She is found satisfaction and identifying diet compliant foods that she enjoys. Her daughter does most of the cooking and ensure she eats well. Recommended continued dysphagia diet including soft foods. Return for follow-up as needed as she continued to improve/do well over the past year.

## 2015-12-21 ENCOUNTER — Ambulatory Visit (HOSPITAL_COMMUNITY): Payer: Self-pay

## 2015-12-21 MED ORDER — PANTOPRAZOLE SODIUM 40 MG PO TBEC: 40.0000 mg | DELAYED_RELEASE_TABLET | Freq: Every day | ORAL | Status: DC

## 2015-12-21 NOTE — Addendum Note (Signed)
Addended by: Gordy Levan, ERIC A on: 12/21/2015 02:18 PM   Modules accepted: Orders

## 2015-12-21 NOTE — Telephone Encounter (Signed)
Please notify patient Rx sent to pharmacy.

## 2015-12-21 NOTE — Telephone Encounter (Signed)
LMOM that the prescription has been sent to the pharmacy.

## 2015-12-25 ENCOUNTER — Ambulatory Visit (HOSPITAL_COMMUNITY): Payer: Self-pay

## 2015-12-28 DIAGNOSIS — S42301A Unspecified fracture of shaft of humerus, right arm, initial encounter for closed fracture: Secondary | ICD-10-CM | POA: Diagnosis not present

## 2015-12-28 DIAGNOSIS — S82002A Unspecified fracture of left patella, initial encounter for closed fracture: Secondary | ICD-10-CM | POA: Diagnosis not present

## 2016-01-06 DIAGNOSIS — E119 Type 2 diabetes mellitus without complications: Secondary | ICD-10-CM | POA: Diagnosis not present

## 2016-01-07 ENCOUNTER — Encounter (HOSPITAL_COMMUNITY): Payer: Self-pay | Admitting: Hematology & Oncology

## 2016-01-07 DIAGNOSIS — N183 Chronic kidney disease, stage 3 unspecified: Secondary | ICD-10-CM

## 2016-01-07 DIAGNOSIS — N189 Chronic kidney disease, unspecified: Secondary | ICD-10-CM | POA: Insufficient documentation

## 2016-01-07 HISTORY — DX: Chronic kidney disease, stage 3 unspecified: N18.30

## 2016-01-08 DIAGNOSIS — Z79899 Other long term (current) drug therapy: Secondary | ICD-10-CM | POA: Diagnosis not present

## 2016-01-08 DIAGNOSIS — E785 Hyperlipidemia, unspecified: Secondary | ICD-10-CM | POA: Diagnosis not present

## 2016-01-08 DIAGNOSIS — E119 Type 2 diabetes mellitus without complications: Secondary | ICD-10-CM | POA: Diagnosis not present

## 2016-01-08 DIAGNOSIS — I10 Essential (primary) hypertension: Secondary | ICD-10-CM | POA: Diagnosis not present

## 2016-01-16 DIAGNOSIS — D539 Nutritional anemia, unspecified: Secondary | ICD-10-CM | POA: Diagnosis not present

## 2016-01-16 DIAGNOSIS — E46 Unspecified protein-calorie malnutrition: Secondary | ICD-10-CM | POA: Diagnosis not present

## 2016-01-16 DIAGNOSIS — N183 Chronic kidney disease, stage 3 (moderate): Secondary | ICD-10-CM | POA: Diagnosis not present

## 2016-01-19 ENCOUNTER — Encounter (HOSPITAL_COMMUNITY): Payer: Medicare Other

## 2016-01-19 ENCOUNTER — Encounter (HOSPITAL_COMMUNITY): Payer: Medicare Other | Attending: Internal Medicine | Admitting: Hematology & Oncology

## 2016-01-19 VITALS — BP 162/67 | HR 99 | Temp 98.2°F | Resp 18

## 2016-01-19 DIAGNOSIS — N189 Chronic kidney disease, unspecified: Secondary | ICD-10-CM

## 2016-01-19 DIAGNOSIS — D631 Anemia in chronic kidney disease: Secondary | ICD-10-CM | POA: Diagnosis not present

## 2016-01-19 DIAGNOSIS — D649 Anemia, unspecified: Secondary | ICD-10-CM | POA: Insufficient documentation

## 2016-01-19 DIAGNOSIS — D519 Vitamin B12 deficiency anemia, unspecified: Secondary | ICD-10-CM

## 2016-01-19 LAB — CBC WITH DIFFERENTIAL/PLATELET
Basophils Absolute: 0 10*3/uL (ref 0.0–0.1)
Basophils Relative: 0 %
Eosinophils Absolute: 0.1 10*3/uL (ref 0.0–0.7)
Eosinophils Relative: 1 %
HCT: 25.6 % — ABNORMAL LOW (ref 36.0–46.0)
Hemoglobin: 8.6 g/dL — ABNORMAL LOW (ref 12.0–15.0)
Lymphocytes Relative: 49 %
Lymphs Abs: 3.2 10*3/uL (ref 0.7–4.0)
MCH: 32.2 pg (ref 26.0–34.0)
MCHC: 33.6 g/dL (ref 30.0–36.0)
MCV: 95.9 fL (ref 78.0–100.0)
Monocytes Absolute: 0.4 10*3/uL (ref 0.1–1.0)
Monocytes Relative: 5 %
Neutro Abs: 3.1 10*3/uL (ref 1.7–7.7)
Neutrophils Relative %: 45 %
Platelets: 253 10*3/uL (ref 150–400)
RBC: 2.67 MIL/uL — ABNORMAL LOW (ref 3.87–5.11)
RDW: 13.7 % (ref 11.5–15.5)
WBC: 6.7 10*3/uL (ref 4.0–10.5)

## 2016-01-19 LAB — COMPREHENSIVE METABOLIC PANEL
ALT: 8 U/L — ABNORMAL LOW (ref 14–54)
AST: 13 U/L — ABNORMAL LOW (ref 15–41)
Albumin: 3.2 g/dL — ABNORMAL LOW (ref 3.5–5.0)
Alkaline Phosphatase: 46 U/L (ref 38–126)
Anion gap: 5 (ref 5–15)
BUN: 30 mg/dL — ABNORMAL HIGH (ref 6–20)
CO2: 23 mmol/L (ref 22–32)
Calcium: 9 mg/dL (ref 8.9–10.3)
Chloride: 112 mmol/L — ABNORMAL HIGH (ref 101–111)
Creatinine, Ser: 1.21 mg/dL — ABNORMAL HIGH (ref 0.44–1.00)
GFR calc Af Amer: 47 mL/min — ABNORMAL LOW (ref >60)
GFR calc non Af Amer: 40 mL/min — ABNORMAL LOW (ref >60)
Glucose, Bld: 127 mg/dL — ABNORMAL HIGH (ref 65–99)
Potassium: 4 mmol/L (ref 3.5–5.1)
Sodium: 140 mmol/L (ref 135–145)
Total Bilirubin: 0.4 mg/dL (ref 0.3–1.2)
Total Protein: 7.8 g/dL (ref 6.5–8.1)

## 2016-01-19 NOTE — Patient Instructions (Signed)
[place] at [facility] Discharge Instructions  RECOMMENDATIONS MADE BY THE CONSULTANT AND ANY TEST RESULTS WILL BE SENT TO YOUR REFERRING PHYSICIAN.  We are going to try you on aranesp to further improve your blood counts You are to stay on your B12 as prescribed every month. We will see you back for an office visit in 2 months  Thank you for choosing [place] at [facility] to provide your oncology and hematology care.  To afford each patient quality time with our provider, please arrive at least 15 minutes before your scheduled appointment time.   Beginning January 23rd 2017 lab work for the [facility] will be done in the  Main lab at [facility] on 1st floor. If you have a lab appointment with the [place] please come in thru the  Main Entrance and check in at the main information desk  You need to re-schedule your appointment should you arrive 10 or more minutes late.  We strive to give you quality time with our providers, and arriving late affects you and other patients whose appointments are after yours.  Also, if you no show three or more times for appointments you may be dismissed from the clinic at the providers discretion.     Again, thank you for choosing [facility].  Our hope is that these requests will decrease the amount of time that you wait before being seen by our physicians.       _____________________________________________________________  Should you have questions after your visit to [facility], please contact our office at [phone] between the hours of 8:30 a.m. and 4:30 p.m.  Voicemails left after 4:30 p.m. will not be returned until the following business day.  For prescription refill requests, have your pharmacy contact our office.         Resources For Cancer Patients and their Caregivers ? American Cancer Society: Can assist with transportation, wigs, general needs,  runs Look Good Feel Better.        ([phone] ? Cancer Care: Provides financial assistance, online support groups, medication/co-pay assistance.  1-800-813-HOPE 954-746-5138) ? [place] Assists [place] cancer patients and their families through emotional , educational and financial support.  3348388806 ? [place] DSS Where to apply for food stamps, Medicaid and utility assistance. 304-683-1169 ? RCATS: Transportation to medical appointments. 864-134-9677 ? Social Security Administration: May apply for disability if have a Stage IV cancer. 814-774-6959 [phone] ? [employer], Disability and Transit Services: Assists with nutrition, care and transit needs. [place] Support Programs: @10RELATIVEDAYS @ > Cancer Support Group  2nd Tuesday of the month 1pm-2pm, Journey Room  > Creative Journey  3rd Tuesday of the month 1130am-1pm, Journey Room  > Look Good Feel Better  1st Wednesday of the month 10am-12 noon, Journey Room (Call [place] to register [phone])

## 2016-01-19 NOTE — Progress Notes (Signed)
[place] at [facility] Progress Note  Patient Care Team: [name], MD as PCP - General (Internal Medicine) Danie Binder, MD as Consulting Physician (Gastroenterology)  CHIEF COMPLAINTS:  Anemia CKD, Stage III Upper endoscopy, EGD, but no colon seen Severe erosive gastritis, moderate duodenitis ESR 138, Polyclonal gammopathy Normal TSH, copper level, B12, folate Erythropoietin level 16.3 mIU/ml Low B12 196 pg/ml  HISTORY OF PRESENTING ILLNESS:   Laura Davenport 82 y.o. female is here because of anemia. She was originally seen back in November and after a PRBC transfusion was lost to follow-up.  She saw Dr. Willey Blade in the interim who sent her back to [place] for ongoing follow-up. She has B12 deficiency and is on B12 and has been compliant.  Laura Davenport returns to the [place] today accompanied by her daughter. She is in a wheelchair.  When asked if she notices any blood in her stool, she says no; they don't notice it being black, tarry, or sticky. Her daughter confirms this, saying "I watch that real close."  Her next B12 shot is due anywhere between Sunday and Wednesday. Her daughter notes that this is going well and happening on a monthly basis. Family has been giving the patient her injections.  Laura Davenport comments that she eats good, has a good appetite; that she doesn't try to eat a lot of sweets. She denies SOB, although she is not very active. No CP.    MEDICAL HISTORY:  Past Medical History  Diagnosis Date  . Type 2 diabetes mellitus ([place])   . Essential hypertension   . HLD (hyperlipidemia)   . Depression   . Septic arthritis (HCC)     MRSA, left knee   . GI bleed     Severe gastritis and duodenal ulcers by EGD May 2016   . PSVT (paroxysmal supraventricular tachycardia) ([place])     SURGICAL HISTORY: Past Surgical History  Procedure Laterality Date  . Cholecystectomy N/A 11/04/2012    Procedure: LAPAROSCOPIC CHOLECYSTECTOMY;  Surgeon: Jamesetta So, MD;  Location: [facility];  Service: General;  Laterality: N/A;  . Knee surgery    . I&d extremity Left 12/24/2014    Procedure: IRRIGATION AND DEBRIDEMENT EXTREMITY AND ARTHROSCOPY KNEE;  Surgeon: Renette Butters, MD;  Location: [facility];  Service: Orthopedics;  Laterality: Left;  . Esophagogastroduodenoscopy N/A 01/06/2015    SLF: mild esophagitis  . Savory dilation  01/06/2015    Procedure: SAVORY DILATION;  Surgeon: Danie Binder, MD;  Location: [facility];  Service: Endoscopy;;    SOCIAL HISTORY: Social History   Social History  . Marital Status: Widowed    Spouse Name: N/A  . Number of Children: 74  . Years of Education: N/A   Occupational History  . retired; [place] History Main Topics  . Smoking status: Never Smoker   . Smokeless tobacco: Not on file     Comment: Never smoked  . Alcohol Use: No  . Drug Use: No  . Sexual Activity: Not on file   Other Topics Concern  . Not on file   Social History Narrative   Lives alone   3 daughters & granddaughter nearby   Lost 1 son-strep         She had 10 children, but 1 passed away. 9 living. She says she has close to 35 grandchildren Her daughter reports 8 great-grandchildren. Some of her descendants live in [place], one lives in [place],  a couple live in [place] [facility] [place]; but most are close by. One of her sons lived in [place], and one daughter lived in [place]. She says some of the younger ones went to college, and they all finished high school.  She is widowed; her husband passed about 15 years ago. She never smoked; she denies any problems with alcohol. 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 She says she "did a little bit of everything." She used to work at [employer] here. 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  She says she doesn't remember her mother or father; her grandmother raised her. She says she never  knew them. She also had no brothers and sisters that she knew of, but might have had them. Her grandmother lived to be in her late 24's or 63's; her mother's mother. Her daughter (Laura Davenport) took care of Ms. Bratz after that.  In terms of hobbies, she says "I like church." She says she/s involved in a lot of church groups. She also likes word puzzles.  FAMILY HISTORY: Family History  Problem Relation Age of Onset  . Family history unknown: Yes   She says she doesn't remember her mother or father; her grandmother raised her. She says she never knew them. She also had no brothers and sisters that she knew of, but might have had them. Her grandmother lived to be in her late 46's or 62's; her mother's mother. Her daughter (Laura Davenport) took care of Laura Davenport after that.  indicated that her mother is deceased. She indicated that her father is deceased.   ALLERGIES:  is allergic to oxycodone.  MEDICATIONS:  Current Outpatient Prescriptions  Medication Sig Dispense Refill  . acetaminophen (TYLENOL) 325 MG tablet Take 650 mg by mouth every 6 (six) hours as needed for mild pain or moderate pain.     . cyanocobalamin (,VITAMIN B-12,) 1000 MCG/ML injection Inject 1 mL (1,000 mcg total) into the skin every 30 (thirty) days. 1 mL 11  . losartan (COZAAR) 100 MG tablet Take 100 mg by mouth daily.     . pantoprazole (PROTONIX) 40 MG tablet Take 1 tablet (40 mg total) by mouth daily. 90 tablet 3   No current facility-administered medications for this visit.    Review of Systems  Constitutional: Negative.   HENT: Negative.   Eyes: Negative.   Respiratory: Negative.   Cardiovascular: Negative.   Gastrointestinal: Negative.   Genitourinary: Negative.   Musculoskeletal: Positive for joint pain.       Pain in the injured left knee. 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  Skin: Negative.   Endo/Heme/Allergies: Negative.   Psychiatric/Behavioral: Negative.   All  other systems reviewed and are negative. 14 point ROS was done and is otherwise as detailed above or in HPI    PHYSICAL EXAMINATION: ECOG PERFORMANCE STATUS: 2 - Symptomatic, <50% confined to bed  Filed Vitals:   01/19/16 1115  BP: 162/67  Pulse: 99  Temp: 98.2 F (36.8 C)  Resp: 18   There were no vitals filed for this visit. Physical Exam  Constitutional: She is oriented to person, place, and time and well-developed, well-nourished, and in no distress.  Exam performed in wheelchair because she cannot ambulate to the table. Wears glasses.  HENT:  Head: Normocephalic and atraumatic.  Mouth/Throat: Oropharynx is clear and moist.  Eyes: Conjunctivae and EOM are normal. Pupils are equal, round,  and reactive to light.  Neck: Normal range of motion. Neck supple. No tracheal deviation present. No thyromegaly present.  Cardiovascular: Normal rate, regular rhythm and normal heart sounds.   Pulmonary/Chest: Effort normal and breath sounds normal.  Abdominal: Soft. Bowel sounds are normal. She exhibits no distension and no mass. There is no tenderness. There is no rebound and no guarding.  Musculoskeletal: She exhibits no edema.  Left knee injury; surgical incision site well healed. Her joint is frozen with little extension. knee swollen  Lymphadenopathy:    She has no cervical adenopathy.  Neurological: She is alert and oriented to person, place, and time. No cranial nerve deficit.  Skin: Skin is warm and dry.  Psychiatric: Mood, memory, affect and judgment normal.  Nursing note and vitals reviewed.   LABORATORY DATA:  I have reviewed the data as listed Lab Results  Component Value Date   WBC 6.7 01/19/2016   HGB 8.6* 01/19/2016   HCT 25.6* 01/19/2016   MCV 95.9 01/19/2016   PLT 253 01/19/2016   CMP     Component Value Date/Time   NA 140 01/19/2016 1028   K 4.0 01/19/2016 1028   CL 112* 01/19/2016 1028   CO2 23 01/19/2016 1028   GLUCOSE 127* 01/19/2016 1028   BUN 30*  01/19/2016 1028   CREATININE 1.21* 01/19/2016 1028   CALCIUM 9.0 01/19/2016 1028   PROT 7.8 01/19/2016 1028   ALBUMIN 3.2* 01/19/2016 1028   AST 13* 01/19/2016 1028   ALT 8* 01/19/2016 1028   ALKPHOS 46 01/19/2016 1028   BILITOT 0.4 01/19/2016 1028   GFRNONAA 40* 01/19/2016 1028   GFRAA 47* 01/19/2016 1028   Results for MARCIANNE, OZBUN (MRN [id number]) as of 01/20/2016 19:13  Ref. Range 05/09/2015 10:34 05/10/2015 13:00 07/31/2015 15:00 10/25/2015 15:48 11/09/2015 10:08 12/08/2015 09:25 01/19/2016 10:28  Hemoglobin Latest Ref Range: 12.0-15.0 g/dL 6.8 (LL) 10.7 (L) 7.1 (L) 8.9 (L) 9.2 (L) 9.6 (L) 8.6 (L)        ASSESSMENT & PLAN:  Anemia CKD, Stage III Upper endoscopy, EGD, but no colon seen Severe erosive gastritis, moderate duodenitis ESR 138, Polyclonal gammopathy, elevated CRP Normal RF, normal CCP antibodies Normal TSH, copper level, B12, folate Erythropoietin level 16.3 mIU/ml B12 deficiency 196 pg/ml   She has B12 deficiency and I have recommended B12 replacement, she is currently on B12 replacement and will continue as prescribed.   We will begin aranesp. I have discussed this with the patient and her daughter and they agree. If no response after about 8 weeks will proceed with BMBX.  SED rate was markedly elevated at 138. We will repeat this in the future. Uncertain of cause.   She will return in 6 to 8 weeks.   All questions were answered. The patient knows to call the clinic with any problems, questions or concerns.  This document serves as a record of services personally performed by Ancil Linsey, MD. It was created on her behalf by Toni Amend, a trained medical scribe. The creation of this record is based on the scribe's personal observations and the provider's statements to them. This document has been checked and approved by the attending provider.  I have reviewed the above documentation for accuracy and completeness, and I agree with the above.  This  note was electronically signed.    Molli Hazard, MD  01/19/2016 11:47 AM

## 2016-01-20 ENCOUNTER — Encounter (HOSPITAL_COMMUNITY): Payer: Self-pay | Admitting: Hematology & Oncology

## 2016-01-20 DIAGNOSIS — N189 Chronic kidney disease, unspecified: Secondary | ICD-10-CM

## 2016-01-20 DIAGNOSIS — D649 Anemia, unspecified: Secondary | ICD-10-CM | POA: Insufficient documentation

## 2016-01-20 DIAGNOSIS — D631 Anemia in chronic kidney disease: Secondary | ICD-10-CM

## 2016-01-20 HISTORY — DX: Chronic kidney disease, unspecified: N18.9

## 2016-01-20 HISTORY — DX: Anemia in chronic kidney disease: D63.1

## 2016-01-22 ENCOUNTER — Ambulatory Visit (HOSPITAL_COMMUNITY): Payer: Self-pay

## 2016-01-23 ENCOUNTER — Other Ambulatory Visit (HOSPITAL_COMMUNITY): Payer: Self-pay | Admitting: Hematology & Oncology

## 2016-01-24 ENCOUNTER — Ambulatory Visit (HOSPITAL_COMMUNITY): Payer: Self-pay

## 2016-01-26 ENCOUNTER — Encounter (HOSPITAL_COMMUNITY): Payer: Medicare Other

## 2016-01-26 ENCOUNTER — Encounter (HOSPITAL_BASED_OUTPATIENT_CLINIC_OR_DEPARTMENT_OTHER): Payer: Medicare Other

## 2016-01-26 VITALS — BP 156/69 | HR 89 | Temp 98.0°F | Resp 16

## 2016-01-26 DIAGNOSIS — N189 Chronic kidney disease, unspecified: Secondary | ICD-10-CM | POA: Diagnosis not present

## 2016-01-26 DIAGNOSIS — D649 Anemia, unspecified: Secondary | ICD-10-CM | POA: Diagnosis not present

## 2016-01-26 DIAGNOSIS — D631 Anemia in chronic kidney disease: Secondary | ICD-10-CM | POA: Diagnosis not present

## 2016-01-26 LAB — FERRITIN: Ferritin: 325 ng/mL — ABNORMAL HIGH (ref 11–307)

## 2016-01-26 LAB — HEMOGLOBIN: Hemoglobin: 9.2 g/dL — ABNORMAL LOW (ref 12.0–15.0)

## 2016-01-26 MED ORDER — DARBEPOETIN ALFA 60 MCG/0.3ML IJ SOSY
50.0000 ug | PREFILLED_SYRINGE | Freq: Once | INTRAMUSCULAR | Status: AC
  Administered 2016-01-26: 50 ug via SUBCUTANEOUS
  Filled 2016-01-26: qty 0.3

## 2016-01-26 NOTE — Progress Notes (Signed)
Laura Davenport presents today for injection per MD orders. Aranesp 74mcg administered SQ in right Abdomen. Administration without incident. Patient tolerated well.

## 2016-01-26 NOTE — Patient Instructions (Signed)
[place] at [facility] Discharge Instructions  RECOMMENDATIONS MADE BY THE CONSULTANT AND ANY TEST RESULTS WILL BE SENT TO YOUR REFERRING PHYSICIAN.  Aranesp injection today.    Thank you for choosing [place] at [facility] to provide your oncology and hematology care.  To afford each patient quality time with our provider, please arrive at least 15 minutes before your scheduled appointment time.   Beginning January 23rd 2017 lab work for the [facility] will be done in the  Main lab at [facility] on 1st floor. If you have a lab appointment with the [place] please come in thru the  Main Entrance and check in at the main information desk  You need to re-schedule your appointment should you arrive 10 or more minutes late.  We strive to give you quality time with our providers, and arriving late affects you and other patients whose appointments are after yours.  Also, if you no show three or more times for appointments you may be dismissed from the clinic at the providers discretion.     Again, thank you for choosing [facility].  Our hope is that these requests will decrease the amount of time that you wait before being seen by our physicians.       _____________________________________________________________  Should you have questions after your visit to [facility], please contact our office at [phone] between the hours of 8:30 a.m. and 4:30 p.m.  Voicemails left after 4:30 p.m. will not be returned until the following business day.  For prescription refill requests, have your pharmacy contact our office.         Resources For Cancer Patients and their Caregivers ? American Cancer Society: Can assist with transportation, wigs, general needs, runs Look Good Feel Better.        [phone] ? Cancer Care: Provides financial assistance, online support groups, medication/co-pay assistance.   1-800-813-HOPE 7722953771) ? [place] Assists [place] cancer patients and their families through emotional , educational and financial support.  317-324-2813 ? [place] DSS Where to apply for food stamps, Medicaid and utility assistance. [phone] ? RCATS: Transportation to medical appointments. 2233812078 ? Social Security Administration: May apply for disability if have a Stage IV cancer. 838-655-9865 [phone] ? [employer], Disability and Transit Services: Assists with nutrition, care and transit needs. [place] Support Programs: @10RELATIVEDAYS @ > Cancer Support Group  2nd Tuesday of the month 1pm-2pm, Journey Room  > Creative Journey  3rd Tuesday of the month 1130am-1pm, Journey Room  > Look Good Feel Better  1st Wednesday of the month 10am-12 noon, Journey Room (Call [place] to register [phone])

## 2016-02-09 ENCOUNTER — Encounter (HOSPITAL_COMMUNITY): Payer: Medicare Other

## 2016-02-09 ENCOUNTER — Encounter (HOSPITAL_COMMUNITY): Payer: Self-pay

## 2016-02-09 ENCOUNTER — Encounter (HOSPITAL_COMMUNITY): Payer: Medicare Other | Attending: Internal Medicine

## 2016-02-09 VITALS — BP 159/81 | HR 84 | Temp 98.4°F | Resp 18

## 2016-02-09 DIAGNOSIS — D649 Anemia, unspecified: Secondary | ICD-10-CM | POA: Diagnosis not present

## 2016-02-09 DIAGNOSIS — D631 Anemia in chronic kidney disease: Secondary | ICD-10-CM

## 2016-02-09 DIAGNOSIS — N189 Chronic kidney disease, unspecified: Secondary | ICD-10-CM | POA: Diagnosis not present

## 2016-02-09 LAB — FERRITIN: Ferritin: 318 ng/mL — ABNORMAL HIGH (ref 11–307)

## 2016-02-09 LAB — HEMOGLOBIN: Hemoglobin: 10.5 g/dL — ABNORMAL LOW (ref 12.0–15.0)

## 2016-02-09 MED ORDER — DARBEPOETIN ALFA 60 MCG/0.3ML IJ SOSY
50.0000 ug | PREFILLED_SYRINGE | Freq: Once | INTRAMUSCULAR | Status: AC
  Administered 2016-02-09: 50 ug via SUBCUTANEOUS
  Filled 2016-02-09: qty 0.3

## 2016-02-09 NOTE — Progress Notes (Signed)
Laura Davenport presents today for injection per the provider's orders.  Aranesp administration without incident; see MAR for injection details.  Patient tolerated procedure well and without incident.  No questions or complaints noted at this time. 

## 2016-02-09 NOTE — Patient Instructions (Signed)
[facility] at [facility] Discharge Instructions  RECOMMENDATIONS MADE BY THE CONSULTANT AND ANY TEST RESULTS WILL BE SENT TO YOUR REFERRING PHYSICIAN.  Aranesp injection today. Return as scheduled for lab work and injections. Return as scheduled for office visit.   Thank you for choosing [facility] at [facility] to provide your oncology and hematology care.  To afford each patient quality time with our provider, please arrive at least 15 minutes before your scheduled appointment time.   Beginning January 23rd 2017 lab work for the Cancer Center will be done in the  Main lab at [facility] on 1st floor. If you have a lab appointment with the Cancer Center please come in thru the  Main Entrance and check in at the main information desk  You need to re-schedule your appointment should you arrive 10 or more minutes late.  We strive to give you quality time with our providers, and arriving late affects you and other patients whose appointments are after yours.  Also, if you no show three or more times for appointments you may be dismissed from the clinic at the providers discretion.     Again, thank you for choosing [facility].  Our hope is that these requests will decrease the amount of time that you wait before being seen by our physicians.       _____________________________________________________________  Should you have questions after your visit to [facility], please contact our office at [phone] between the hours of 8:30 a.m. and 4:30 p.m.  Voicemails left after 4:30 p.m. will not be returned until the following business day.  For prescription refill requests, have your pharmacy contact our office.         Resources For Cancer Patients and their Caregivers ? American Cancer Society: Can assist with transportation, wigs, general needs, runs Look Good Feel Better.        1-888-227-6333 ? Cancer  Care: Provides financial assistance, online support groups, medication/co-pay assistance.  1-800-813-HOPE (4673) ? [facility] Assists [place] cancer patients and their families through emotional , educational and financial support.  336-427-4357 ? [place] DSS Where to apply for food stamps, Medicaid and utility assistance. 336-342-1394 ? RCATS: Transportation to medical appointments. 336-347-2287 ? Social Security Administration: May apply for disability if have a Stage IV cancer. 336-342-7796 1-800-772-1213 ? [place] Aging, Disability and Transit Services: Assists with nutrition, care and transit needs. 336-349-2343  Cancer Center Support Programs: @10RELATIVEDAYS@ > Cancer Support Group  2nd Tuesday of the month 1pm-2pm, Journey Room  > Creative Journey  3rd Tuesday of the month 1130am-1pm, Journey Room  > Look Good Feel Better  1st Wednesday of the month 10am-12 noon, Journey Room (Call [employer] to register [phone])   

## 2016-02-23 ENCOUNTER — Encounter (HOSPITAL_BASED_OUTPATIENT_CLINIC_OR_DEPARTMENT_OTHER): Payer: Medicare Other

## 2016-02-23 ENCOUNTER — Encounter (HOSPITAL_COMMUNITY): Payer: Medicare Other

## 2016-02-23 ENCOUNTER — Encounter (HOSPITAL_COMMUNITY): Payer: Self-pay

## 2016-02-23 VITALS — BP 141/72 | HR 82 | Temp 98.2°F | Resp 18

## 2016-02-23 DIAGNOSIS — D631 Anemia in chronic kidney disease: Secondary | ICD-10-CM

## 2016-02-23 DIAGNOSIS — N189 Chronic kidney disease, unspecified: Secondary | ICD-10-CM | POA: Diagnosis not present

## 2016-02-23 DIAGNOSIS — D649 Anemia, unspecified: Secondary | ICD-10-CM | POA: Diagnosis not present

## 2016-02-23 LAB — FERRITIN: Ferritin: 305 ng/mL (ref 11–307)

## 2016-02-23 LAB — HEMOGLOBIN: Hemoglobin: 10.9 g/dL — ABNORMAL LOW (ref 12.0–15.0)

## 2016-02-23 MED ORDER — DARBEPOETIN ALFA 60 MCG/0.3ML IJ SOSY
50.0000 ug | PREFILLED_SYRINGE | Freq: Once | INTRAMUSCULAR | Status: AC
  Administered 2016-02-23: 50 ug via SUBCUTANEOUS

## 2016-02-23 MED ORDER — DARBEPOETIN ALFA 60 MCG/0.3ML IJ SOSY
PREFILLED_SYRINGE | INTRAMUSCULAR | Status: AC
  Filled 2016-02-23: qty 0.3

## 2016-02-23 NOTE — Progress Notes (Signed)
Margaretha Seeds presents today for injection per the provider's orders.  aranesp  administration without incident; see MAR for injection details.  Patient tolerated procedure well and without incident.  No questions or complaints noted at this time.

## 2016-02-23 NOTE — Patient Instructions (Signed)
[place] at [facility] Discharge Instructions  RECOMMENDATIONS MADE BY THE CONSULTANT AND ANY TEST RESULTS WILL BE SENT TO YOUR REFERRING PHYSICIAN.  Hemoglobin 10.9 aranesp 50 mcg Follow up as scheduled Please call the clinic if you have any questions or concerns   Thank you for choosing [place] at [facility] to provide your oncology and hematology care.  To afford each patient quality time with our provider, please arrive at least 15 minutes before your scheduled appointment time.   Beginning January 23rd 2017 lab work for the [facility] will be done in the  Main lab at [facility] on 1st floor. If you have a lab appointment with the [place] please come in thru the  Main Entrance and check in at the main information desk  You need to re-schedule your appointment should you arrive 10 or more minutes late.  We strive to give you quality time with our providers, and arriving late affects you and other patients whose appointments are after yours.  Also, if you no show three or more times for appointments you may be dismissed from the clinic at the providers discretion.     Again, thank you for choosing [facility].  Our hope is that these requests will decrease the amount of time that you wait before being seen by our physicians.       _____________________________________________________________  Should you have questions after your visit to [facility], please contact our office at [phone] between the hours of 8:30 a.m. and 4:30 p.m.  Voicemails left after 4:30 p.m. will not be returned until the following business day.  For prescription refill requests, have your pharmacy contact our office.         Resources For Cancer Patients and their Caregivers ? American Cancer Society: Can assist with transportation, wigs, general needs, runs Look Good Feel Better.        2073282916 ? Cancer  Care: Provides financial assistance, online support groups, medication/co-pay assistance.  1-800-813-HOPE 561 657 3807) ? [place] Assists [place] cancer patients and their families through emotional , educational and financial support.  435-237-2266 ? [place] DSS Where to apply for food stamps, Medicaid and utility assistance. 878-308-2343 ? RCATS: Transportation to medical appointments. (864)381-1025 ? Social Security Administration: May apply for disability if have a Stage IV cancer. (256) 374-9572 832 444 2534 ? [employer], Disability and Transit Services: Assists with nutrition, care and transit needs. [place] Support Programs: @10RELATIVEDAYS @ > Cancer Support Group  2nd Tuesday of the month 1pm-2pm, Journey Room  > Creative Journey  3rd Tuesday of the month 1130am-1pm, Journey Room  > Look Good Feel Better  1st Wednesday of the month 10am-12 noon, Journey Room (Call [place] to register [phone])

## 2016-03-22 ENCOUNTER — Inpatient Hospital Stay (HOSPITAL_COMMUNITY): Admit: 2016-03-22 | Payer: Self-pay

## 2016-03-22 ENCOUNTER — Ambulatory Visit (HOSPITAL_COMMUNITY): Payer: Self-pay | Admitting: Oncology

## 2016-03-22 ENCOUNTER — Encounter (HOSPITAL_COMMUNITY): Payer: Self-pay | Admitting: Oncology

## 2016-03-22 ENCOUNTER — Encounter (HOSPITAL_COMMUNITY): Payer: Medicare Other | Attending: Internal Medicine | Admitting: Oncology

## 2016-03-22 VITALS — BP 147/95 | HR 86 | Temp 97.5°F | Resp 16

## 2016-03-22 DIAGNOSIS — D519 Vitamin B12 deficiency anemia, unspecified: Secondary | ICD-10-CM

## 2016-03-22 DIAGNOSIS — D649 Anemia, unspecified: Secondary | ICD-10-CM | POA: Insufficient documentation

## 2016-03-22 DIAGNOSIS — N189 Chronic kidney disease, unspecified: Secondary | ICD-10-CM

## 2016-03-22 DIAGNOSIS — E538 Deficiency of other specified B group vitamins: Secondary | ICD-10-CM | POA: Diagnosis not present

## 2016-03-22 DIAGNOSIS — D631 Anemia in chronic kidney disease: Secondary | ICD-10-CM

## 2016-03-22 DIAGNOSIS — N183 Chronic kidney disease, stage 3 (moderate): Secondary | ICD-10-CM | POA: Diagnosis not present

## 2016-03-22 LAB — CBC
HCT: 32.2 % — ABNORMAL LOW (ref 36.0–46.0)
Hemoglobin: 11.2 g/dL — ABNORMAL LOW (ref 12.0–15.0)
MCH: 33.6 pg (ref 26.0–34.0)
MCHC: 34.8 g/dL (ref 30.0–36.0)
MCV: 96.7 fL (ref 78.0–100.0)
Platelets: 188 10*3/uL (ref 150–400)
RBC: 3.33 MIL/uL — ABNORMAL LOW (ref 3.87–5.11)
RDW: 14.6 % (ref 11.5–15.5)
WBC: 6.1 10*3/uL (ref 4.0–10.5)

## 2016-03-22 NOTE — Assessment & Plan Note (Addendum)
Anemia of chronic renal disease, Stage III, on Aranesp 50 mcg every 2 weeks.  Labs today: CBC.  I personally reviewed and went over laboratory results with the patient.  The results are noted within this dictation.  Hgb is 11.2 g/dL and therefore, she does not qualify for Aranesp today.  This medication is held today.  Aranesp will continue in an every 2 week fashion.  Bone marrow aspiration and biopsy has been discussed, but she has responded to ESA therapy well thus far and therefore is not indicated at his time.  Labs every 2 weeks: CBC  Labs every 90 days: Ferritin  Return in 12 weeks for follow-up.

## 2016-03-22 NOTE — Patient Instructions (Signed)
[place] at [facility] Discharge Instructions  RECOMMENDATIONS MADE BY THE CONSULTANT AND ANY TEST RESULTS WILL BE SENT TO YOUR REFERRING PHYSICIAN.  You saw Laura Davenport today. Lab work drawn today. If Hemoglobin is less than 11, you will have injection today. Labs and Aranesp injection every 2 weeks. Return for follow up visit in 3 months.   Thank you for choosing [place] at [facility] to provide your oncology and hematology care.  To afford each patient quality time with our provider, please arrive at least 15 minutes before your scheduled appointment time.   Beginning January 23rd 2017 lab work for the [facility] will be done in the  Main lab at [facility] on 1st floor. If you have a lab appointment with the [place] please come in thru the  Main Entrance and check in at the main information desk  You need to re-schedule your appointment should you arrive 10 or more minutes late.  We strive to give you quality time with our providers, and arriving late affects you and other patients whose appointments are after yours.  Also, if you no show three or more times for appointments you may be dismissed from the clinic at the providers discretion.     Again, thank you for choosing [facility].  Our hope is that these requests will decrease the amount of time that you wait before being seen by our physicians.       _____________________________________________________________  Should you have questions after your visit to [facility], please contact our office at [phone] between the hours of 8:30 a.m. and 4:30 p.m.  Voicemails left after 4:30 p.m. will not be returned until the following business day.  For prescription refill requests, have your pharmacy contact our office.         Resources For Cancer Patients and their Caregivers ? American Cancer Society: Can assist with transportation, wigs,  general needs, runs Look Good Feel Better.        318-243-3160 ? Cancer Care: Provides financial assistance, online support groups, medication/co-pay assistance.  1-800-813-HOPE 509-684-4032) ? [place] Assists [place] cancer patients and their families through emotional , educational and financial support.  [phone] ? [place] DSS Where to apply for food stamps, Medicaid and utility assistance. 236-330-3774 ? RCATS: Transportation to medical appointments. 845-100-3748 ? Social Security Administration: May apply for disability if have a Stage IV cancer. 579-707-1958 914-383-8619 ? [employer], Disability and Transit Services: Assists with nutrition, care and transit needs. [place] Support Programs: @10RELATIVEDAYS @ > Cancer Support Group  2nd Tuesday of the month 1pm-2pm, Journey Room  > Creative Journey  3rd Tuesday of the month 1130am-1pm, Journey Room  > Look Good Feel Better  1st Wednesday of the month 10am-12 noon, Journey Room (Call [place] to register [phone])

## 2016-03-24 ENCOUNTER — Encounter (HOSPITAL_COMMUNITY): Payer: Self-pay | Admitting: Oncology

## 2016-03-24 NOTE — Progress Notes (Signed)
[name], MD [address] [place] [address]  Anemia in chronic kidney disease - Plan: CARTIA XT 120 MG 24 hr capsule, CBC, CBC, CBC, CANCELED: CBC, CANCELED: CBC, CANCELED: CBC  B12 deficiency anemia  CURRENT THERAPY: Aranesp 50 mcg every 2 weeks  INTERVAL HISTORY: Laura Davenport 82 y.o. female returns for followup of anemia of chronic renal disease, Stage III, on ESA therapy.  Additionally, ED shows erosive gastritis and moderate duodenitis. AND B12 deficiency requiring B12 injections 1000 mcg that are given at home.  She denies any complaints.  She is provided education regarding anemia of chronic renal disease and Aranesp therapy.  We discussed the risks, benefits, alternatives, and side effects of therapy.  Review of Systems  Constitutional: Negative for chills and fever.  HENT: Negative.   Eyes: Negative.   Respiratory: Negative.   Cardiovascular: Negative.   Gastrointestinal: Negative.   Genitourinary: Negative.   Musculoskeletal: Negative.   Skin: Negative.   Neurological: Negative.   Endo/Heme/Allergies: Negative.   Psychiatric/Behavioral: Negative.     Past Medical History:  Diagnosis Date  . Anemia in chronic kidney disease 01/20/2016  . B12 deficiency anemia 10/25/2015  . Depression   . Essential hypertension   . GI bleed    Severe gastritis and duodenal ulcers by EGD May 2016   . HLD (hyperlipidemia)   . PSVT (paroxysmal supraventricular tachycardia) ([place])   . Septic arthritis (HCC)    MRSA, left knee   . Type 2 diabetes mellitus ([place])     Past Surgical History:  Procedure Laterality Date  . CHOLECYSTECTOMY N/A 11/04/2012   Procedure: LAPAROSCOPIC CHOLECYSTECTOMY;  Surgeon: Jamesetta So, MD;  Location: [facility];  Service: General;  Laterality: N/A;  . ESOPHAGOGASTRODUODENOSCOPY N/A 01/06/2015   SLF: mild esophagitis  . I&D EXTREMITY Left 12/24/2014   Procedure: IRRIGATION AND DEBRIDEMENT EXTREMITY AND ARTHROSCOPY KNEE;   Surgeon: Renette Butters, MD;  Location: [facility];  Service: Orthopedics;  Laterality: Left;  . KNEE SURGERY    . SAVORY DILATION  01/06/2015   Procedure: SAVORY DILATION;  Surgeon: Danie Binder, MD;  [facility];  Service: Endoscopy;;    Family History  Problem Relation Age of Onset  . Family history unknown: Yes    Social History   Social History  . Marital status: Widowed    Spouse name: N/A  . Number of children: 69  . Years of education: N/A   Occupational History  . retired; [place] History Main Topics  . Smoking status: Never Smoker  . Smokeless tobacco: None     Comment: Never smoked  . Alcohol use No  . Drug use: No  . Sexual activity: Not Asked   Other Topics Concern  . None   Social History Narrative   Lives alone   3 daughters & granddaughter nearby   Lost 1 son-strep           PHYSICAL EXAMINATION  ECOG PERFORMANCE STATUS: 2 - Symptomatic, <50% confined to bed  Vitals:   03/22/16 1358  BP: (!) 147/95  Pulse: 86  Resp: 16  Temp: 97.5 F (36.4 C)    GENERAL:alert, no distress, comfortable, cooperative, smiling and accompanied by her daughter. SKIN: skin color, texture, turgor are normal, no rashes or significant lesions HEAD: Normocephalic, No masses, lesions, tenderness or abnormalities EYES: normal, EOMI, Conjunctiva are pink and non-injected EARS: External ears normal OROPHARYNX:lips, buccal mucosa, and tongue normal  and mucous membranes are moist  NECK: supple, trachea midline LYMPH:  not examined BREAST:not examined LUNGS: clear to auscultation  HEART: regular rate & rhythm ABDOMEN:abdomen soft and normal bowel sounds BACK: Back symmetric, no curvature. EXTREMITIES:less then 2 second capillary refill, no joint deformities, effusion, or inflammation, no skin discoloration, no cyanosis  NEURO: alert & oriented x 3 with fluent speech, no focal motor/sensory deficits    LABORATORY DATA: CBC    Component  Value Date/Time   WBC 6.1 03/22/2016 1445   RBC 3.33 (L) 03/22/2016 1445   HGB 11.2 (L) 03/22/2016 1445   HCT 32.2 (L) 03/22/2016 1445   PLT 188 03/22/2016 1445   MCV 96.7 03/22/2016 1445   MCH 33.6 03/22/2016 1445   MCHC 34.8 03/22/2016 1445   RDW 14.6 03/22/2016 1445   LYMPHSABS 3.2 01/19/2016 1028   MONOABS 0.4 01/19/2016 1028   EOSABS 0.1 01/19/2016 1028   BASOSABS 0.0 01/19/2016 1028      Chemistry      Component Value Date/Time   NA 140 01/19/2016 1028   K 4.0 01/19/2016 1028   CL 112 (H) 01/19/2016 1028   CO2 23 01/19/2016 1028   BUN 30 (H) 01/19/2016 1028   CREATININE 1.21 (H) 01/19/2016 1028      Component Value Date/Time   CALCIUM 9.0 01/19/2016 1028   ALKPHOS 46 01/19/2016 1028   AST 13 (L) 01/19/2016 1028   ALT 8 (L) 01/19/2016 1028   BILITOT 0.4 01/19/2016 1028        PENDING LABS:   RADIOGRAPHIC STUDIES:  No results found.   PATHOLOGY:    ASSESSMENT AND PLAN:  Anemia in chronic kidney disease Anemia of chronic renal disease, Stage III, on Aranesp 50 mcg every 2 weeks.  Labs today: CBC.  I personally reviewed and went over laboratory results with the patient.  The results are noted within this dictation.  Hgb is 11.2 g/dL and therefore, she does not qualify for Aranesp today.  This medication is held today.  Aranesp will continue in an every 2 week fashion.  Bone marrow aspiration and biopsy has been discussed, but she has responded to ESA therapy well thus far and therefore is not indicated at his time.  Labs every 2 weeks: CBC  Labs every 90 days: Ferritin  Return in 12 weeks for follow-up.  B12 deficiency anemia B12 deficiency requiring B12 injections 1000 mcg that are given at home.   ORDERS PLACED FOR THIS ENCOUNTER: Orders Placed This Encounter  Procedures  . CBC  . CBC  . CBC    MEDICATIONS PRESCRIBED THIS ENCOUNTER: Meds ordered this encounter  Medications  . CARTIA XT 120 MG 24 hr capsule    THERAPY PLAN:    Continue with B12 injections at home.  We will continue with ESA therapy as planned.  All questions were answered. The patient knows to call the clinic with any problems, questions or concerns. We can certainly see the patient much sooner if necessary.  Patient and plan discussed with Dr. Ancil Linsey and she is in agreement with the aforementioned.   This note is electronically signed by: Doy Mince 03/24/2016 10:35 PM

## 2016-03-24 NOTE — Assessment & Plan Note (Signed)
B12 deficiency requiring B12 injections 1000 mcg that are given at home.

## 2016-04-06 DIAGNOSIS — E119 Type 2 diabetes mellitus without complications: Secondary | ICD-10-CM | POA: Diagnosis not present

## 2016-04-10 ENCOUNTER — Encounter (HOSPITAL_COMMUNITY): Payer: Medicare Other | Attending: Internal Medicine

## 2016-04-10 ENCOUNTER — Encounter (HOSPITAL_COMMUNITY): Payer: Medicare Other

## 2016-04-10 DIAGNOSIS — D649 Anemia, unspecified: Secondary | ICD-10-CM | POA: Insufficient documentation

## 2016-04-10 DIAGNOSIS — D631 Anemia in chronic kidney disease: Secondary | ICD-10-CM

## 2016-04-10 DIAGNOSIS — N189 Chronic kidney disease, unspecified: Secondary | ICD-10-CM

## 2016-04-10 LAB — CBC
HCT: 30.8 % — ABNORMAL LOW (ref 36.0–46.0)
Hemoglobin: 11.2 g/dL — ABNORMAL LOW (ref 12.0–15.0)
MCH: 35.1 pg — ABNORMAL HIGH (ref 26.0–34.0)
MCHC: 36.4 g/dL — ABNORMAL HIGH (ref 30.0–36.0)
MCV: 96.6 fL (ref 78.0–100.0)
Platelets: 200 10*3/uL (ref 150–400)
RBC: 3.19 MIL/uL — ABNORMAL LOW (ref 3.87–5.11)
RDW: 13.9 % (ref 11.5–15.5)
WBC: 6.1 10*3/uL (ref 4.0–10.5)

## 2016-04-10 LAB — FERRITIN: Ferritin: 297 ng/mL (ref 11–307)

## 2016-04-10 NOTE — Progress Notes (Signed)
Aranesp not given today per protocol. Hbg 11.2. Patient and daughter made aware. Patient left via wheelchair with daughter.

## 2016-04-12 ENCOUNTER — Ambulatory Visit (HOSPITAL_COMMUNITY): Payer: Self-pay | Admitting: Oncology

## 2016-04-12 ENCOUNTER — Ambulatory Visit (HOSPITAL_COMMUNITY): Payer: Self-pay

## 2016-04-12 ENCOUNTER — Other Ambulatory Visit (HOSPITAL_COMMUNITY): Payer: Self-pay

## 2016-04-24 ENCOUNTER — Encounter (HOSPITAL_COMMUNITY): Payer: Self-pay

## 2016-04-24 ENCOUNTER — Encounter (HOSPITAL_COMMUNITY): Payer: Medicare Other

## 2016-04-24 ENCOUNTER — Encounter (HOSPITAL_BASED_OUTPATIENT_CLINIC_OR_DEPARTMENT_OTHER): Payer: Medicare Other

## 2016-04-24 VITALS — BP 127/85 | HR 83 | Temp 98.2°F | Resp 16

## 2016-04-24 DIAGNOSIS — D631 Anemia in chronic kidney disease: Secondary | ICD-10-CM

## 2016-04-24 DIAGNOSIS — D649 Anemia, unspecified: Secondary | ICD-10-CM | POA: Diagnosis not present

## 2016-04-24 DIAGNOSIS — N189 Chronic kidney disease, unspecified: Principal | ICD-10-CM

## 2016-04-24 DIAGNOSIS — N183 Chronic kidney disease, stage 3 (moderate): Secondary | ICD-10-CM

## 2016-04-24 LAB — FERRITIN: Ferritin: 288 ng/mL (ref 11–307)

## 2016-04-24 LAB — CBC
HCT: 30.7 % — ABNORMAL LOW (ref 36.0–46.0)
Hemoglobin: 10.5 g/dL — ABNORMAL LOW (ref 12.0–15.0)
MCH: 32.4 pg (ref 26.0–34.0)
MCHC: 34.2 g/dL (ref 30.0–36.0)
MCV: 94.8 fL (ref 78.0–100.0)
Platelets: 208 10*3/uL (ref 150–400)
RBC: 3.24 MIL/uL — ABNORMAL LOW (ref 3.87–5.11)
RDW: 13.8 % (ref 11.5–15.5)
WBC: 6.5 10*3/uL (ref 4.0–10.5)

## 2016-04-24 MED ORDER — DARBEPOETIN ALFA 60 MCG/0.3ML IJ SOSY
PREFILLED_SYRINGE | INTRAMUSCULAR | Status: AC
  Filled 2016-04-24: qty 0.3

## 2016-04-24 MED ORDER — DARBEPOETIN ALFA 60 MCG/0.3ML IJ SOSY
50.0000 ug | PREFILLED_SYRINGE | Freq: Once | INTRAMUSCULAR | Status: AC
  Administered 2016-04-24: 50 ug via SUBCUTANEOUS

## 2016-04-24 NOTE — Progress Notes (Signed)
Pt given Aranesp 67mcg SQ in right upper abdomen per orders. Pt tolerated well. Return as scheduled.

## 2016-04-24 NOTE — Patient Instructions (Signed)
[place] at [facility] Discharge Instructions  RECOMMENDATIONS MADE BY THE CONSULTANT AND ANY TEST RESULTS WILL BE SENT TO YOUR REFERRING PHYSICIAN.  Pt given Aranesp 71mcg today.  Follow up as scheduled.   Thank you for choosing [place] at [facility] to provide your oncology and hematology care.  To afford each patient quality time with our provider, please arrive at least 15 minutes before your scheduled appointment time.   Beginning January 23rd 2017 lab work for the [facility] will be done in the  Main lab at [facility] on 1st floor. If you have a lab appointment with the [facility] please come in thru the  Main Entrance and check in at the main information desk  You need to re-schedule your appointment should you arrive 10 or more minutes late.  We strive to give you quality time with our providers, and arriving late affects you and other patients whose appointments are after yours.  Also, if you no show three or more times for appointments you may be dismissed from the clinic at the providers discretion.     Again, thank you for choosing [facility].  Our hope is that these requests will decrease the amount of time that you wait before being seen by our physicians.       _____________________________________________________________  Should you have questions after your visit to [facility], please contact our office at [phone] between the hours of 8:30 a.m. and 4:30 p.m.  Voicemails left after 4:30 p.m. will not be returned until the following business day.  For prescription refill requests, have your pharmacy contact our office.         Resources For Cancer Patients and their Caregivers ? American Cancer Society: Can assist with transportation, wigs, general needs, runs Look Good Feel Better.        5097106606 ? Cancer Care: Provides financial assistance, online support groups,  medication/co-pay assistance.  1-800-813-HOPE 865-504-2768) ? [place] Assists [place] cancer patients and their families through emotional , educational and financial support.  (760) 845-7015 ? [place] DSS Where to apply for food stamps, Medicaid and utility assistance. 678-529-5128 ? RCATS: Transportation to medical appointments. 6460864779 ? Social Security Administration: May apply for disability if have a Stage IV cancer. 858-469-5877 [phone] ? [employer], Disability and Transit Services: Assists with nutrition, care and transit needs. [place] Support Programs: @10RELATIVEDAYS @ > Cancer Support Group  2nd Tuesday of the month 1pm-2pm, Journey Room  > Creative Journey  3rd Tuesday of the month 1130am-1pm, Journey Room  > Look Good Feel Better  1st Wednesday of the month 10am-12 noon, Journey Room (Call [place] to register [phone])

## 2016-05-08 ENCOUNTER — Encounter (HOSPITAL_COMMUNITY): Payer: Medicare Other | Attending: Internal Medicine

## 2016-05-08 ENCOUNTER — Encounter (HOSPITAL_COMMUNITY): Payer: Medicare Other | Attending: Hematology & Oncology

## 2016-05-08 VITALS — BP 147/73 | HR 87 | Temp 98.2°F | Resp 18

## 2016-05-08 DIAGNOSIS — I10 Essential (primary) hypertension: Secondary | ICD-10-CM | POA: Diagnosis not present

## 2016-05-08 DIAGNOSIS — D649 Anemia, unspecified: Secondary | ICD-10-CM | POA: Insufficient documentation

## 2016-05-08 DIAGNOSIS — Z79899 Other long term (current) drug therapy: Secondary | ICD-10-CM | POA: Diagnosis not present

## 2016-05-08 DIAGNOSIS — D631 Anemia in chronic kidney disease: Secondary | ICD-10-CM

## 2016-05-08 DIAGNOSIS — N189 Chronic kidney disease, unspecified: Secondary | ICD-10-CM

## 2016-05-08 DIAGNOSIS — E785 Hyperlipidemia, unspecified: Secondary | ICD-10-CM | POA: Diagnosis not present

## 2016-05-08 LAB — CBC
HCT: 31.4 % — ABNORMAL LOW (ref 36.0–46.0)
Hemoglobin: 10.9 g/dL — ABNORMAL LOW (ref 12.0–15.0)
MCH: 33.3 pg (ref 26.0–34.0)
MCHC: 34.7 g/dL (ref 30.0–36.0)
MCV: 96 fL (ref 78.0–100.0)
Platelets: 232 10*3/uL (ref 150–400)
RBC: 3.27 MIL/uL — ABNORMAL LOW (ref 3.87–5.11)
RDW: 14 % (ref 11.5–15.5)
WBC: 6.1 10*3/uL (ref 4.0–10.5)

## 2016-05-08 LAB — FERRITIN: Ferritin: 247 ng/mL (ref 11–307)

## 2016-05-08 MED ORDER — DARBEPOETIN ALFA 60 MCG/0.3ML IJ SOSY
50.0000 ug | PREFILLED_SYRINGE | Freq: Once | INTRAMUSCULAR | Status: AC
  Administered 2016-05-08: 50 ug via SUBCUTANEOUS

## 2016-05-08 MED ORDER — DARBEPOETIN ALFA 60 MCG/0.3ML IJ SOSY
PREFILLED_SYRINGE | INTRAMUSCULAR | Status: AC
  Filled 2016-05-08: qty 0.3

## 2016-05-08 NOTE — Progress Notes (Signed)
Laura Davenport presents today for injection per MD orders. Aranesp 50 mcg administered SQ in left Abdomen. Administration without incident. Patient tolerated well.  

## 2016-05-08 NOTE — Patient Instructions (Signed)
[place] at [facility] Discharge Instructions  RECOMMENDATIONS MADE BY THE CONSULTANT AND ANY TEST RESULTS WILL BE SENT TO YOUR REFERRING PHYSICIAN.  Hemoglobin 10.9.  Aranesp 50 mcg injection given as ordered. Return as scheduled.  Thank you for choosing [place] at [facility] to provide your oncology and hematology care.  To afford each patient quality time with our provider, please arrive at least 15 minutes before your scheduled appointment time.   Beginning January 23rd 2017 lab work for the [facility] will be done in the  Main lab at [facility] on 1st floor. If you have a lab appointment with the [facility] please come in thru the  Main Entrance and check in at the main information desk  You need to re-schedule your appointment should you arrive 10 or more minutes late.  We strive to give you quality time with our providers, and arriving late affects you and other patients whose appointments are after yours.  Also, if you no show three or more times for appointments you may be dismissed from the clinic at the providers discretion.     Again, thank you for choosing [facility].  Our hope is that these requests will decrease the amount of time that you wait before being seen by our physicians.       _____________________________________________________________  Should you have questions after your visit to [facility], please contact our office at [phone] between the hours of 8:30 a.m. and 4:30 p.m.  Voicemails left after 4:30 p.m. will not be returned until the following business day.  For prescription refill requests, have your pharmacy contact our office.         Resources For Cancer Patients and their Caregivers ? American Cancer Society: Can assist with transportation, wigs, general needs, runs Look Good Feel Better.        ([phone] ? Cancer Care: Provides financial assistance,  online support groups, medication/co-pay assistance.  1-800-813-HOPE 979-725-9860) ? [place] Assists [place] cancer patients and their families through emotional , educational and financial support.  548-251-4276 ? [place] DSS Where to apply for food stamps, Medicaid and utility assistance. 202-234-3794 ? RCATS: Transportation to medical appointments. 828-431-3216 ? Social Security Administration: May apply for disability if have a Stage IV cancer. (734) 540-7827 [phone])268-3539 ? [employer], Disability and Transit Services: Assists with nutrition, care and transit needs. [place] Support Programs: @10RELATIVEDAYS @ > Cancer Support Group  2nd Tuesday of the month 1pm-2pm, Journey Room  > Creative Journey  3rd Tuesday of the month 1130am-1pm, Journey Room  > Look Good Feel Better  1st Wednesday of the month 10am-12 noon, Journey Room (Call [place] to register [phone])

## 2016-05-09 ENCOUNTER — Telehealth (HOSPITAL_COMMUNITY): Payer: Self-pay | Admitting: *Deleted

## 2016-05-09 NOTE — Telephone Encounter (Signed)
Pt aware labs are stable.  

## 2016-05-09 NOTE — Telephone Encounter (Signed)
Pt aware labs are good 

## 2016-05-09 NOTE — Telephone Encounter (Signed)
-----   Message from [facility], PA-C sent at 05/09/2016  1:17 PM EDT ----- Kermit Balo.

## 2016-05-09 NOTE — Telephone Encounter (Signed)
-----   Message from Baird Cancer, PA-C sent at 05/08/2016  4:55 PM EDT ----- Stable.  Ferritin is pending.

## 2016-05-15 DIAGNOSIS — N183 Chronic kidney disease, stage 3 (moderate): Secondary | ICD-10-CM | POA: Diagnosis not present

## 2016-05-15 DIAGNOSIS — D649 Anemia, unspecified: Secondary | ICD-10-CM | POA: Diagnosis not present

## 2016-05-15 DIAGNOSIS — Z23 Encounter for immunization: Secondary | ICD-10-CM | POA: Diagnosis not present

## 2016-05-15 DIAGNOSIS — I10 Essential (primary) hypertension: Secondary | ICD-10-CM | POA: Diagnosis not present

## 2016-05-22 ENCOUNTER — Encounter (HOSPITAL_COMMUNITY): Payer: Medicare Other | Attending: Hematology & Oncology

## 2016-05-22 ENCOUNTER — Encounter (HOSPITAL_COMMUNITY): Payer: Medicare Other

## 2016-05-22 DIAGNOSIS — D631 Anemia in chronic kidney disease: Secondary | ICD-10-CM

## 2016-05-22 DIAGNOSIS — N189 Chronic kidney disease, unspecified: Principal | ICD-10-CM

## 2016-05-22 DIAGNOSIS — D649 Anemia, unspecified: Secondary | ICD-10-CM | POA: Diagnosis not present

## 2016-05-22 LAB — CBC
HCT: 31.2 % — ABNORMAL LOW (ref 36.0–46.0)
Hemoglobin: 11.3 g/dL — ABNORMAL LOW (ref 12.0–15.0)
MCH: 35.1 pg — ABNORMAL HIGH (ref 26.0–34.0)
MCHC: 36.2 g/dL — ABNORMAL HIGH (ref 30.0–36.0)
MCV: 96.9 fL (ref 78.0–100.0)
Platelets: 211 10*3/uL (ref 150–400)
RBC: 3.22 MIL/uL — ABNORMAL LOW (ref 3.87–5.11)
RDW: 15.1 % (ref 11.5–15.5)
WBC: 5.8 10*3/uL (ref 4.0–10.5)

## 2016-05-22 NOTE — Progress Notes (Signed)
Patient not to get injection r/t lab results and treatment parameters.  Patient aware of lab results and next appointment date.

## 2016-06-05 ENCOUNTER — Encounter (HOSPITAL_COMMUNITY): Payer: Medicare Other | Attending: Hematology & Oncology

## 2016-06-05 ENCOUNTER — Encounter (HOSPITAL_COMMUNITY): Payer: Medicare Other | Attending: Internal Medicine

## 2016-06-05 ENCOUNTER — Telehealth (HOSPITAL_COMMUNITY): Payer: Self-pay | Admitting: *Deleted

## 2016-06-05 DIAGNOSIS — D649 Anemia, unspecified: Secondary | ICD-10-CM | POA: Diagnosis present

## 2016-06-05 DIAGNOSIS — N189 Chronic kidney disease, unspecified: Secondary | ICD-10-CM

## 2016-06-05 DIAGNOSIS — D631 Anemia in chronic kidney disease: Secondary | ICD-10-CM

## 2016-06-05 LAB — CBC
HCT: 33.2 % — ABNORMAL LOW (ref 36.0–46.0)
Hemoglobin: 11.2 g/dL — ABNORMAL LOW (ref 12.0–15.0)
MCH: 32.4 pg (ref 26.0–34.0)
MCHC: 33.7 g/dL (ref 30.0–36.0)
MCV: 96 fL (ref 78.0–100.0)
Platelets: 178 10*3/uL (ref 150–400)
RBC: 3.46 MIL/uL — ABNORMAL LOW (ref 3.87–5.11)
RDW: 14.7 % (ref 11.5–15.5)
WBC: 6.6 10*3/uL (ref 4.0–10.5)

## 2016-06-05 NOTE — Telephone Encounter (Signed)
-----   Message from Baird Cancer, PA-C sent at 06/05/2016  4:09 PM EDT ----- Stable

## 2016-06-05 NOTE — Progress Notes (Signed)
Patient not to get injection today r/t lab results and treatment parameters.  Patient aware and follow up schedule given to patient and caregiver.

## 2016-06-06 NOTE — Telephone Encounter (Signed)
Pt aware of results 

## 2016-06-06 NOTE — Telephone Encounter (Signed)
-----   Message from Baird Cancer, PA-C sent at 06/05/2016  4:09 PM EDT ----- Stable

## 2016-06-13 NOTE — Progress Notes (Signed)
Laura Noble, MD [address] [place] [address]  Anemia in stage 3 chronic kidney disease - Plan: Renal function panel, CBC with Differential, CBC, Ferritin  Anemia due to vitamin B12 deficiency, unspecified B12 deficiency type - Plan: Vitamin B12, CBC with Differential  Malodorous urine - Plan: Urinalysis, Routine w reflex microscopic, Urine culture, Urine culture  Acute cystitis without hematuria - Plan: ciprofloxacin (CIPRO) 250 MG tablet  CURRENT THERAPY: Aranesp 50 mcg every 2 weeks (changing to every 3 weeks today, 06/14/2016)  INTERVAL HISTORY: Laura Davenport 82 y.o. female returns for followup of anemia of chronic renal disease, Stage III, on ESA therapy.  Additionally, ED shows erosive gastritis and moderate duodenitis. AND B12 deficiency requiring B12 injections 1000 mcg that are given at home.  She denies any complaints.  She is provided education regarding anemia of chronic renal disease and Aranesp therapy.  We discussed the risks, benefits, alternatives, and side effects of therapy.  She notes some urinary discomfort.  She admits to poor oral H2O.  She denies any pelvic pain, urinary frequency.  She does have a urine odor to her during exam.  She otherwise is doing well.    Review of Systems  Constitutional: Negative.  Negative for chills and fever.  HENT: Negative.   Eyes: Negative.   Respiratory: Negative.   Cardiovascular: Negative.   Gastrointestinal: Negative.   Genitourinary: Positive for dysuria. Negative for flank pain, frequency, hematuria and urgency.  Musculoskeletal: Negative.   Skin: Negative.   Neurological: Negative.   Endo/Heme/Allergies: Negative.   Psychiatric/Behavioral: Negative.     Past Medical History:  Diagnosis Date  . Anemia in chronic kidney disease 01/20/2016  . B12 deficiency anemia 10/25/2015  . Depression   . Essential hypertension   . GI bleed    Severe gastritis and duodenal ulcers by EGD May 2016     . HLD (hyperlipidemia)   . PSVT (paroxysmal supraventricular tachycardia) ([place])   . Septic arthritis (HCC)    MRSA, left knee   . Type 2 diabetes mellitus ([place])     Past Surgical History:  Procedure Laterality Date  . CHOLECYSTECTOMY N/A 11/04/2012   Procedure: LAPAROSCOPIC CHOLECYSTECTOMY;  Surgeon: Jamesetta So, MD;  Location[facility];  Service: General;  Laterality: N/A;  . ESOPHAGOGASTRODUODENOSCOPY N/A 01/06/2015   SLF: mild esophagitis  . I&D EXTREMITY Left 12/24/2014   Procedure: IRRIGATION AND DEBRIDEMENT EXTREMITY AND ARTHROSCOPY KNEE;  Surgeon: Renette Butters, MD;  Location: [facility];  Service: Orthopedics;  Laterality: Left;  . KNEE SURGERY    . SAVORY DILATION  01/06/2015   Procedure: SAVORY DILATION;  Surgeon: Danie Binder, MD;  [facility];  Service: Endoscopy;;    Family History  Problem Relation Age of Onset  . Family history unknown: Yes    Social History   Social History  . Marital status: Widowed    Spouse name: N/A  . Number of children: 62  . Years of education: N/A   Occupational History  . retired; [place] History Main Topics  . Smoking status: Never Smoker  . Smokeless tobacco: Never Used     Comment: Never smoked  . Alcohol use No  . Drug use: No  . Sexual activity: Not Asked   Other Topics Concern  . None   Social History Narrative   Lives alone   3 daughters & granddaughter nearby   Lost 1 son-strep  PHYSICAL EXAMINATION  ECOG PERFORMANCE STATUS: 2 - Symptomatic, <50% confined to bed  Vitals:   06/14/16 1101  BP: (!) 150/91  Pulse: 89  Resp: 16  Temp: 97.9 F (36.6 C)    GENERAL:alert, no distress, comfortable, cooperative, smiling and accompanied by her daughter.  Malodorous urine smell noted on exam. SKIN: skin color, texture, turgor are normal, no rashes or significant lesions HEAD: Normocephalic, No masses, lesions, tenderness or abnormalities EYES: normal, EOMI, Conjunctiva are pink  and non-injected EARS: External ears normal OROPHARYNX:lips, buccal mucosa, and tongue normal and mucous membranes are moist  NECK: supple, trachea midline LYMPH:  not examined BREAST:not examined LUNGS: clear to auscultation  HEART: regular rate & rhythm ABDOMEN:abdomen soft and normal bowel sounds BACK: Back symmetric, no curvature. EXTREMITIES:less then 2 second capillary refill, no joint deformities, effusion, or inflammation, no skin discoloration, no cyanosis  NEURO: alert & oriented x 3 with fluent speech, no focal motor/sensory deficits, in wheelchair    LABORATORY DATA: CBC    Component Value Date/Time   WBC 7.9 06/14/2016 1121   RBC 3.57 (L) 06/14/2016 1121   HGB 11.5 (L) 06/14/2016 1121   HCT 33.9 (L) 06/14/2016 1121   PLT 181 06/14/2016 1121   MCV 95.0 06/14/2016 1121   MCH 32.2 06/14/2016 1121   MCHC 33.9 06/14/2016 1121   RDW 14.3 06/14/2016 1121   LYMPHSABS 3.5 06/14/2016 1121   MONOABS 0.5 06/14/2016 1121   EOSABS 0.1 06/14/2016 1121   BASOSABS 0.0 06/14/2016 1121      Chemistry      Component Value Date/Time   NA 138 06/14/2016 1121   K 4.4 06/14/2016 1121   CL 109 06/14/2016 1121   CO2 25 06/14/2016 1121   BUN 34 (H) 06/14/2016 1121   CREATININE 1.38 (H) 06/14/2016 1121      Component Value Date/Time   CALCIUM 9.2 06/14/2016 1121   ALKPHOS 46 01/19/2016 1028   AST 13 (L) 01/19/2016 1028   ALT 8 (L) 01/19/2016 1028   BILITOT 0.4 01/19/2016 1028     Lab Results  Component Value Date   FERRITIN 247 05/08/2016     PENDING LABS:   RADIOGRAPHIC STUDIES:  No results found.   PATHOLOGY:    ASSESSMENT AND PLAN:  Anemia in chronic kidney disease Anemia of chronic renal disease, Stage III, on Aranesp 50 mcg.  Labs today: CBC, renal function panel.  I personally reviewed and went over laboratory results with the patient.  The results are noted within this dictation.  Her HGB is too high for Aranesp today.  She notes intermittent urinary  pain.  On exam, she has the odor of urine.  Order is placed for UA with reflex and culture and sensitivity.  UA returned with Leukocytes and blood in urine.  I suspect she has a UTI.  Rx is escribed for Cipro 250 mg BID x 5 days.  Aranesp is deferred today.  She has not needed Aranesp since 05/08/2016.  As a result, I will change her supportive therapy plan to every 3 weeks.  Bone marrow aspiration and biopsy has been discussed, but she has responded to ESA therapy well thus far and therefore is not indicated at his time.  Labs every 3 weeks: CBC Labs every 90 days: Ferritin  Return in 12 weeks for follow-up.  B12 deficiency anemia B12 deficiency requiring B12 injections 1000 mcg that are given at home.  Labs today: B12   ORDERS PLACED FOR THIS ENCOUNTER: Orders Placed This  Encounter  Procedures  . Urine culture  . Renal function panel  . Urinalysis, Routine w reflex microscopic  . Vitamin B12  . CBC with Differential  . CBC  . Ferritin    MEDICATIONS PRESCRIBED THIS ENCOUNTER: Meds ordered this encounter  Medications  . ciprofloxacin (CIPRO) 250 MG tablet    Sig: Take 1 tablet (250 mg total) by mouth 2 (two) times daily.    Dispense:  10 tablet    Refill:  0    Order Specific Question:   Supervising Provider    Answer:   Patrici Ranks [id number]    THERAPY PLAN:  Continue with B12 injections at home.  We will continue with ESA therapy as planned.  All questions were answered. The patient knows to call the clinic with any problems, questions or concerns. We can certainly see the patient much sooner if necessary.  Patient and plan discussed with Dr. Ancil Linsey and she is in agreement with the aforementioned.   This note is electronically signed by: Doy Mince 06/14/2016 5:12 PM

## 2016-06-13 NOTE — Assessment & Plan Note (Addendum)
Anemia of chronic renal disease, Stage III, on Aranesp 50 mcg.  Labs today: CBC, renal function panel.  I personally reviewed and went over laboratory results with the patient.  The results are noted within this dictation.  Her HGB is too high for Aranesp today.  She notes intermittent urinary pain.  On exam, she has the odor of urine.  Order is placed for UA with reflex and culture and sensitivity.  UA returned with Leukocytes and blood in urine.  I suspect she has a UTI.  Rx is escribed for Cipro 250 mg BID x 5 days.  Aranesp is deferred today.  She has not needed Aranesp since 05/08/2016.  As a result, I will change her supportive therapy plan to every 3 weeks.  Bone marrow aspiration and biopsy has been discussed, but she has responded to ESA therapy well thus far and therefore is not indicated at his time.  Labs every 3 weeks: CBC Labs every 90 days: Ferritin  Return in 12 weeks for follow-up.

## 2016-06-13 NOTE — Assessment & Plan Note (Addendum)
B12 deficiency requiring B12 injections 1000 mcg that are given at home.  Labs today: B12

## 2016-06-14 ENCOUNTER — Encounter (HOSPITAL_COMMUNITY): Payer: Medicare Other

## 2016-06-14 ENCOUNTER — Encounter (HOSPITAL_BASED_OUTPATIENT_CLINIC_OR_DEPARTMENT_OTHER): Payer: Medicare Other | Admitting: Oncology

## 2016-06-14 ENCOUNTER — Encounter (HOSPITAL_COMMUNITY): Payer: Self-pay | Admitting: Oncology

## 2016-06-14 VITALS — BP 150/91 | HR 89 | Temp 97.9°F | Resp 16

## 2016-06-14 DIAGNOSIS — R8290 Unspecified abnormal findings in urine: Secondary | ICD-10-CM

## 2016-06-14 DIAGNOSIS — N183 Chronic kidney disease, stage 3 (moderate): Principal | ICD-10-CM

## 2016-06-14 DIAGNOSIS — D519 Vitamin B12 deficiency anemia, unspecified: Secondary | ICD-10-CM

## 2016-06-14 DIAGNOSIS — D631 Anemia in chronic kidney disease: Secondary | ICD-10-CM

## 2016-06-14 DIAGNOSIS — E538 Deficiency of other specified B group vitamins: Secondary | ICD-10-CM | POA: Diagnosis not present

## 2016-06-14 DIAGNOSIS — D649 Anemia, unspecified: Secondary | ICD-10-CM | POA: Diagnosis not present

## 2016-06-14 DIAGNOSIS — N3000 Acute cystitis without hematuria: Secondary | ICD-10-CM

## 2016-06-14 LAB — URINALYSIS, ROUTINE W REFLEX MICROSCOPIC
Bilirubin Urine: NEGATIVE
Glucose, UA: NEGATIVE mg/dL
Ketones, ur: NEGATIVE mg/dL
Nitrite: NEGATIVE
Protein, ur: 30 mg/dL — AB
Specific Gravity, Urine: 1.015 (ref 1.005–1.030)
pH: 6 (ref 5.0–8.0)

## 2016-06-14 LAB — RENAL FUNCTION PANEL
Albumin: 3.7 g/dL (ref 3.5–5.0)
Anion gap: 4 — ABNORMAL LOW (ref 5–15)
BUN: 34 mg/dL — ABNORMAL HIGH (ref 6–20)
CO2: 25 mmol/L (ref 22–32)
Calcium: 9.2 mg/dL (ref 8.9–10.3)
Chloride: 109 mmol/L (ref 101–111)
Creatinine, Ser: 1.38 mg/dL — ABNORMAL HIGH (ref 0.44–1.00)
GFR calc Af Amer: 40 mL/min — ABNORMAL LOW (ref >60)
GFR calc non Af Amer: 34 mL/min — ABNORMAL LOW (ref >60)
Glucose, Bld: 129 mg/dL — ABNORMAL HIGH (ref 65–99)
Phosphorus: 3.9 mg/dL (ref 2.5–4.6)
Potassium: 4.4 mmol/L (ref 3.5–5.1)
Sodium: 138 mmol/L (ref 135–145)

## 2016-06-14 LAB — CBC WITH DIFFERENTIAL/PLATELET
Basophils Absolute: 0 10*3/uL (ref 0.0–0.1)
Basophils Relative: 1 %
Eosinophils Absolute: 0.1 10*3/uL (ref 0.0–0.7)
Eosinophils Relative: 1 %
HCT: 33.9 % — ABNORMAL LOW (ref 36.0–46.0)
Hemoglobin: 11.5 g/dL — ABNORMAL LOW (ref 12.0–15.0)
Lymphocytes Relative: 44 %
Lymphs Abs: 3.5 10*3/uL (ref 0.7–4.0)
MCH: 32.2 pg (ref 26.0–34.0)
MCHC: 33.9 g/dL (ref 30.0–36.0)
MCV: 95 fL (ref 78.0–100.0)
Monocytes Absolute: 0.5 10*3/uL (ref 0.1–1.0)
Monocytes Relative: 6 %
Neutro Abs: 3.9 10*3/uL (ref 1.7–7.7)
Neutrophils Relative %: 48 %
Platelets: 181 10*3/uL (ref 150–400)
RBC: 3.57 MIL/uL — ABNORMAL LOW (ref 3.87–5.11)
RDW: 14.3 % (ref 11.5–15.5)
WBC: 7.9 10*3/uL (ref 4.0–10.5)

## 2016-06-14 LAB — VITAMIN B12: Vitamin B-12: 792 pg/mL (ref 180–914)

## 2016-06-14 LAB — URINE MICROSCOPIC-ADD ON

## 2016-06-14 MED ORDER — CIPROFLOXACIN HCL 250 MG PO TABS: 250.0000 mg | ORAL_TABLET | Freq: Two times a day (BID) | ORAL | 0 refills | Status: AC

## 2016-06-14 NOTE — Patient Instructions (Signed)
[place] at [facility] Discharge Instructions  RECOMMENDATIONS MADE BY THE CONSULTANT AND ANY TEST RESULTS WILL BE SENT TO YOUR REFERRING PHYSICIAN.  You saw [name], PA-C, today. Urine Specimen and labs today. We will call you with results. - if you need aranesp injection we will schedule. Labs and aranesp in 3 weeks. Follow up in 3 months  Thank you for choosing [place] at [facility] to provide your oncology and hematology care.  To afford each patient quality time with our provider, please arrive at least 15 minutes before your scheduled appointment time.   Beginning January 23rd 2017 lab work for the [facility] will be done in the  Main lab at [facility] on 1st floor. If you have a lab appointment with the [place] please come in thru the  Main Entrance and check in at the main information desk  You need to re-schedule your appointment should you arrive 10 or more minutes late.  We strive to give you quality time with our providers, and arriving late affects you and other patients whose appointments are after yours.  Also, if you no show three or more times for appointments you may be dismissed from the clinic at the providers discretion.     Again, thank you for choosing [facility].  Our hope is that these requests will decrease the amount of time that you wait before being seen by our physicians.       _____________________________________________________________  Should you have questions after your visit to [facility], please contact our office at [phone] between the hours of 8:30 a.m. and 4:30 p.m.  Voicemails left after 4:30 p.m. will not be returned until the following business day.  For prescription refill requests, have your pharmacy contact our office.         Resources For Cancer Patients and their Caregivers ? American Cancer Society: Can assist with transportation, wigs,  general needs, runs Look Good Feel Better.        831-341-1856 ? Cancer Care: Provides financial assistance, online support groups, medication/co-pay assistance.  1-800-813-HOPE 417-538-7000) ? [facility] Assists [place] cancer patients and their families through emotional , educational and financial support.  631-698-8637 ? [place] DSS Where to apply for food stamps, Medicaid and utility assistance. (506) 749-6371 ? RCATS: Transportation to medical appointments. (306) 406-1741 ? Social Security Administration: May apply for disability if have a Stage IV cancer. 813-528-4483 567-741-0749 ? [employer], Disability and Transit Services: Assists with nutrition, care and transit needs. [place] Support Programs: @10RELATIVEDAYS @ > Cancer Support Group  2nd Tuesday of the month 1pm-2pm, Journey Room  > Creative Journey  3rd Tuesday of the month 1130am-1pm, Journey Room  > Look Good Feel Better  1st Wednesday of the month 10am-12 noon, Journey Room (Call [place] to register [phone])

## 2016-06-18 LAB — URINE CULTURE: Culture: >=100000 — AB

## 2016-06-20 ENCOUNTER — Other Ambulatory Visit (HOSPITAL_COMMUNITY): Payer: Self-pay | Admitting: Oncology

## 2016-06-20 DIAGNOSIS — N3000 Acute cystitis without hematuria: Secondary | ICD-10-CM

## 2016-06-26 ENCOUNTER — Encounter (HOSPITAL_COMMUNITY): Payer: Medicare Other

## 2016-06-26 ENCOUNTER — Encounter (HOSPITAL_BASED_OUTPATIENT_CLINIC_OR_DEPARTMENT_OTHER): Payer: Medicare Other

## 2016-06-26 VITALS — BP 150/69 | HR 103 | Temp 97.8°F | Resp 18

## 2016-06-26 DIAGNOSIS — N183 Chronic kidney disease, stage 3 unspecified: Secondary | ICD-10-CM

## 2016-06-26 DIAGNOSIS — D649 Anemia, unspecified: Secondary | ICD-10-CM | POA: Diagnosis not present

## 2016-06-26 DIAGNOSIS — D631 Anemia in chronic kidney disease: Secondary | ICD-10-CM

## 2016-06-26 LAB — CBC
HCT: 31.4 % — ABNORMAL LOW (ref 36.0–46.0)
Hemoglobin: 10.9 g/dL — ABNORMAL LOW (ref 12.0–15.0)
MCH: 32.8 pg (ref 26.0–34.0)
MCHC: 34.7 g/dL (ref 30.0–36.0)
MCV: 94.6 fL (ref 78.0–100.0)
Platelets: 202 10*3/uL (ref 150–400)
RBC: 3.32 MIL/uL — ABNORMAL LOW (ref 3.87–5.11)
RDW: 14.7 % (ref 11.5–15.5)
WBC: 7.8 10*3/uL (ref 4.0–10.5)

## 2016-06-26 LAB — FERRITIN: Ferritin: 419 ng/mL — ABNORMAL HIGH (ref 11–307)

## 2016-06-26 MED ORDER — DARBEPOETIN ALFA 60 MCG/0.3ML IJ SOSY
50.0000 ug | PREFILLED_SYRINGE | Freq: Once | INTRAMUSCULAR | Status: AC
  Administered 2016-06-26: 50 ug via SUBCUTANEOUS

## 2016-06-26 MED ORDER — DARBEPOETIN ALFA 60 MCG/0.3ML IJ SOSY
PREFILLED_SYRINGE | INTRAMUSCULAR | Status: AC
  Filled 2016-06-26: qty 0.3

## 2016-06-26 NOTE — Progress Notes (Signed)
Laura Davenport presents today for injection per MD orders. Aranesp 60 mcg administered SQ in left Abdomen. Administration without incident. Patient tolerated well.

## 2016-06-26 NOTE — Patient Instructions (Signed)
[place] at [facility] Discharge Instructions  RECOMMENDATIONS MADE BY THE CONSULTANT AND ANY TEST RESULTS WILL BE SENT TO YOUR REFERRING PHYSICIAN.  Hemoglobin 10.9 today. Aranesp 50 mcg injection today as ordered. Return as scheduled.  Thank you for choosing [place] at [facility] to provide your oncology and hematology care.  To afford each patient quality time with our provider, please arrive at least 15 minutes before your scheduled appointment time.   Beginning January 23rd 2017 lab work for the [facility] will be done in the  Main lab at [facility] on 1st floor. If you have a lab appointment with the [place] please come in thru the  Main Entrance and check in at the main information desk  You need to re-schedule your appointment should you arrive 10 or more minutes late.  We strive to give you quality time with our providers, and arriving late affects you and other patients whose appointments are after yours.  Also, if you no show three or more times for appointments you may be dismissed from the clinic at the providers discretion.     Again, thank you for choosing [facility].  Our hope is that these requests will decrease the amount of time that you wait before being seen by our physicians.       _____________________________________________________________  Should you have questions after your visit to [facility], please contact our office at [phone] between the hours of 8:30 a.m. and 4:30 p.m.  Voicemails left after 4:30 p.m. will not be returned until the following business day.  For prescription refill requests, have your pharmacy contact our office.         Resources For Cancer Patients and their Caregivers ? American Cancer Society: Can assist with transportation, wigs, general needs, runs Look Good Feel Better.        (859)813-3465 ? Cancer Care: Provides financial  assistance, online support groups, medication/co-pay assistance.  1-800-813-HOPE (380)709-0927) ? [place] Assists [place] cancer patients and their families through emotional , educational and financial support.  (475) 071-8052 ? [place] DSS Where to apply for food stamps, Medicaid and utility assistance. 870-155-4523 ? RCATS: Transportation to medical appointments. (404)706-6734 ? Social Security Administration: May apply for disability if have a Stage IV cancer. 470-277-5155 705-037-3760 ? [employer], Disability and Transit Services: Assists with nutrition, care and transit needs. [place] Support Programs: @10RELATIVEDAYS @ > Cancer Support Group  2nd Tuesday of the month 1pm-2pm, Journey Room  > Creative Journey  3rd Tuesday of the month 1130am-1pm, Journey Room  > Look Good Feel Better  1st Wednesday of the month 10am-12 noon, Journey Room (Call [place] to register [phone])

## 2016-07-06 DIAGNOSIS — E119 Type 2 diabetes mellitus without complications: Secondary | ICD-10-CM | POA: Diagnosis not present

## 2016-07-17 ENCOUNTER — Encounter (HOSPITAL_COMMUNITY): Payer: Medicare Other

## 2016-07-17 ENCOUNTER — Encounter (HOSPITAL_COMMUNITY): Payer: Medicare Other | Attending: Hematology & Oncology

## 2016-07-17 VITALS — BP 151/89 | HR 79 | Temp 97.9°F | Resp 18

## 2016-07-17 DIAGNOSIS — D631 Anemia in chronic kidney disease: Secondary | ICD-10-CM

## 2016-07-17 DIAGNOSIS — N183 Chronic kidney disease, stage 3 unspecified: Secondary | ICD-10-CM

## 2016-07-17 LAB — CBC
HCT: 30.8 % — ABNORMAL LOW (ref 36.0–46.0)
Hemoglobin: 11 g/dL — ABNORMAL LOW (ref 12.0–15.0)
MCH: 35 pg — ABNORMAL HIGH (ref 26.0–34.0)
MCHC: 35.7 g/dL (ref 30.0–36.0)
MCV: 98.1 fL (ref 78.0–100.0)
Platelets: 237 10*3/uL (ref 150–400)
RBC: 3.14 MIL/uL — ABNORMAL LOW (ref 3.87–5.11)
RDW: 15.1 % (ref 11.5–15.5)
WBC: 7.5 10*3/uL (ref 4.0–10.5)

## 2016-07-17 MED ORDER — DARBEPOETIN ALFA 100 MCG/0.5ML IJ SOSY
PREFILLED_SYRINGE | INTRAMUSCULAR | Status: AC
  Filled 2016-07-17: qty 0.5

## 2016-07-17 MED ORDER — DARBEPOETIN ALFA 60 MCG/0.3ML IJ SOSY
50.0000 ug | PREFILLED_SYRINGE | Freq: Once | INTRAMUSCULAR | Status: AC
  Administered 2016-07-17: 50 ug via SUBCUTANEOUS

## 2016-07-17 NOTE — Progress Notes (Signed)
Laura Davenport presents today for injection per MD orders. Aranesp 50 mcg administered SQ in left Abdomen. Administration without incident. Patient tolerated well.

## 2016-07-17 NOTE — Patient Instructions (Signed)
[place] at [facility] Discharge Instructions  RECOMMENDATIONS MADE BY THE CONSULTANT AND ANY TEST RESULTS WILL BE SENT TO YOUR REFERRING PHYSICIAN.  Hemoglobin 11.0. Aranesp 50 mcg injection given as ordered. Return as scheduled.  Thank you for choosing [place] at [facility] to provide your oncology and hematology care.  To afford each patient quality time with our provider, please arrive at least 15 minutes before your scheduled appointment time.   Beginning January 23rd 2017 lab work for the [facility] will be done in the  Main lab at [facility] on 1st floor. If you have a lab appointment with the [place] please come in thru the  Main Entrance and check in at the main information desk  You need to re-schedule your appointment should you arrive 10 or more minutes late.  We strive to give you quality time with our providers, and arriving late affects you and other patients whose appointments are after yours.  Also, if you no show three or more times for appointments you may be dismissed from the clinic at the providers discretion.     Again, thank you for choosing [facility].  Our hope is that these requests will decrease the amount of time that you wait before being seen by our physicians.       _____________________________________________________________  Should you have questions after your visit to [facility], please contact our office at [phone] between the hours of 8:30 a.m. and 4:30 p.m.  Voicemails left after 4:30 p.m. will not be returned until the following business day.  For prescription refill requests, have your pharmacy contact our office.         Resources For Cancer Patients and their Caregivers ? American Cancer Society: Can assist with transportation, wigs, general needs, runs Look Good Feel Better.        610-539-2095 ? Cancer Care: Provides financial assistance,  online support groups, medication/co-pay assistance.  1-800-813-HOPE 989-868-7981) ? [place] Assists [place] cancer patients and their families through emotional , educational and financial support.  343 613 7588 ? [place] DSS Where to apply for food stamps, Medicaid and utility assistance. (623) 773-6839 ? RCATS: Transportation to medical appointments. 562-513-8938 ? Social Security Administration: May apply for disability if have a Stage IV cancer. 346-775-6183 435-460-8228 ? [employer], Disability and Transit Services: Assists with nutrition, care and transit needs. [place] Support Programs: @10RELATIVEDAYS @ > Cancer Support Group  2nd Tuesday of the month 1pm-2pm, Journey Room  > Creative Journey  3rd Tuesday of the month 1130am-1pm, Journey Room  > Look Good Feel Better  1st Wednesday of the month 10am-12 noon, Journey Room (Call [place] to register [phone])

## 2016-08-07 ENCOUNTER — Encounter (HOSPITAL_COMMUNITY): Payer: Self-pay

## 2016-08-07 ENCOUNTER — Encounter (HOSPITAL_COMMUNITY): Payer: Medicare Other

## 2016-08-07 ENCOUNTER — Encounter (HOSPITAL_COMMUNITY): Payer: Medicare Other | Attending: Hematology & Oncology

## 2016-08-07 VITALS — BP 153/57 | HR 80 | Temp 98.4°F | Resp 16

## 2016-08-07 DIAGNOSIS — N183 Chronic kidney disease, stage 3 unspecified: Secondary | ICD-10-CM

## 2016-08-07 DIAGNOSIS — D631 Anemia in chronic kidney disease: Secondary | ICD-10-CM | POA: Diagnosis not present

## 2016-08-07 LAB — CBC
HCT: 34.4 % — ABNORMAL LOW (ref 36.0–46.0)
Hemoglobin: 10.9 g/dL — ABNORMAL LOW (ref 12.0–15.0)
MCH: 30 pg (ref 26.0–34.0)
MCHC: 31.7 g/dL (ref 30.0–36.0)
MCV: 94.8 fL (ref 78.0–100.0)
Platelets: 191 10*3/uL (ref 150–400)
RBC: 3.63 MIL/uL — ABNORMAL LOW (ref 3.87–5.11)
RDW: 15.2 % (ref 11.5–15.5)
WBC: 6.7 10*3/uL (ref 4.0–10.5)

## 2016-08-07 MED ORDER — DARBEPOETIN ALFA 60 MCG/0.3ML IJ SOSY
PREFILLED_SYRINGE | INTRAMUSCULAR | Status: AC
  Filled 2016-08-07: qty 0.3

## 2016-08-07 MED ORDER — DARBEPOETIN ALFA 60 MCG/0.3ML IJ SOSY
50.0000 ug | PREFILLED_SYRINGE | Freq: Once | INTRAMUSCULAR | Status: AC
  Administered 2016-08-07: 50 ug via SUBCUTANEOUS

## 2016-08-07 NOTE — Progress Notes (Signed)
Margaretha Seeds presents today for injection per MD orders. Aranesp 18mcg administered SQ in left Abdomen. Administration without incident. Patient tolerated well.  Vitals stable and discharged from clinic via wheelchair with daughter.follow up as scheduled.

## 2016-08-07 NOTE — Patient Instructions (Signed)
[facility] at [facility] Discharge Instructions  RECOMMENDATIONS MADE BY THE CONSULTANT AND ANY TEST RESULTS WILL BE SENT TO YOUR REFERRING PHYSICIAN.  Aranesp given today  Follow up as scheduled  Thank you for choosing [facility] at [facility] to provide your oncology and hematology care.  To afford each patient quality time with our provider, please arrive at least 15 minutes before your scheduled appointment time.   Beginning January 23rd 2017 lab work for the Cancer Center will be done in the  Main lab at [place] [facility] on 1st floor. If you have a lab appointment with the Cancer Center please come in thru the  Main Entrance and check in at the main information desk  You need to re-schedule your appointment should you arrive 10 or more minutes late.  We strive to give you quality time with our providers, and arriving late affects you and other patients whose appointments are after yours.  Also, if you no show three or more times for appointments you may be dismissed from the clinic at the providers discretion.     Again, thank you for choosing [facility].  Our hope is that these requests will decrease the amount of time that you wait before being seen by our physicians.       _____________________________________________________________  Should you have questions after your visit to  Cancer Center, please contact our office at [phone] between the hours of 8:30 a.m. and 4:30 p.m.  Voicemails left after 4:30 p.m. will not be returned until the following business day.  For prescription refill requests, have your pharmacy contact our office.         Resources For Cancer Patients and their Caregivers ? American Cancer Society: Can assist with transportation, wigs, general needs, runs Look Good Feel Better.        1-888-227-6333 ? Cancer Care: Provides financial assistance, online support groups, medication/co-pay  assistance.  1-800-813-HOPE (4673) ? [facility] Assists [place] cancer patients and their families through emotional , educational and financial support.  336-427-4357 ? [place] DSS Where to apply for food stamps, Medicaid and utility assistance. 336-342-1394 ? RCATS: Transportation to medical appointments. 336-347-2287 ? Social Security Administration: May apply for disability if have a Stage IV cancer. 336-342-7796 1-800-772-1213 ? [place] Aging, Disability and Transit Services: Assists with nutrition, care and transit needs. 336-349-2343  Cancer Center Support Programs: @10RELATIVEDAYS@ > Cancer Support Group  2nd Tuesday of the month 1pm-2pm, Journey Room  > Creative Journey  3rd Tuesday of the month 1130am-1pm, Journey Room  > Look Good Feel Better  1st Wednesday of the month 10am-12 noon, Journey Room (Call [employer] to register [phone])    

## 2016-08-13 DIAGNOSIS — I10 Essential (primary) hypertension: Secondary | ICD-10-CM | POA: Diagnosis not present

## 2016-08-13 DIAGNOSIS — E785 Hyperlipidemia, unspecified: Secondary | ICD-10-CM | POA: Diagnosis not present

## 2016-08-13 DIAGNOSIS — E119 Type 2 diabetes mellitus without complications: Secondary | ICD-10-CM | POA: Diagnosis not present

## 2016-08-13 DIAGNOSIS — Z79899 Other long term (current) drug therapy: Secondary | ICD-10-CM | POA: Diagnosis not present

## 2016-08-22 DIAGNOSIS — I10 Essential (primary) hypertension: Secondary | ICD-10-CM | POA: Diagnosis not present

## 2016-08-22 DIAGNOSIS — E119 Type 2 diabetes mellitus without complications: Secondary | ICD-10-CM | POA: Diagnosis not present

## 2016-08-22 DIAGNOSIS — N183 Chronic kidney disease, stage 3 (moderate): Secondary | ICD-10-CM | POA: Diagnosis not present

## 2016-08-22 DIAGNOSIS — Z23 Encounter for immunization: Secondary | ICD-10-CM | POA: Diagnosis not present

## 2016-08-28 ENCOUNTER — Encounter (HOSPITAL_COMMUNITY): Payer: Medicare Other

## 2016-08-28 DIAGNOSIS — N183 Chronic kidney disease, stage 3 unspecified: Secondary | ICD-10-CM

## 2016-08-28 DIAGNOSIS — D631 Anemia in chronic kidney disease: Secondary | ICD-10-CM | POA: Diagnosis not present

## 2016-08-28 LAB — CBC
HCT: 36.7 % (ref 36.0–46.0)
Hemoglobin: 11.7 g/dL — ABNORMAL LOW (ref 12.0–15.0)
MCH: 30.6 pg (ref 26.0–34.0)
MCHC: 31.9 g/dL (ref 30.0–36.0)
MCV: 96.1 fL (ref 78.0–100.0)
Platelets: 280 10*3/uL (ref 150–400)
RBC: 3.82 MIL/uL — ABNORMAL LOW (ref 3.87–5.11)
RDW: 14.5 % (ref 11.5–15.5)
WBC: 7.2 10*3/uL (ref 4.0–10.5)

## 2016-08-28 NOTE — Progress Notes (Signed)
Aranesp held today for hemoglobin of 11.7.  Pt notified of results.

## 2016-09-18 ENCOUNTER — Ambulatory Visit (HOSPITAL_COMMUNITY): Payer: Self-pay | Admitting: Oncology

## 2016-09-18 ENCOUNTER — Ambulatory Visit (HOSPITAL_COMMUNITY): Payer: Self-pay

## 2016-09-18 ENCOUNTER — Other Ambulatory Visit (HOSPITAL_COMMUNITY): Payer: Self-pay

## 2016-09-23 ENCOUNTER — Encounter (HOSPITAL_COMMUNITY): Payer: Self-pay

## 2016-09-23 ENCOUNTER — Encounter (HOSPITAL_COMMUNITY): Payer: Medicare Other

## 2016-09-23 ENCOUNTER — Encounter (HOSPITAL_COMMUNITY): Payer: Medicare Other | Attending: Hematology & Oncology

## 2016-09-23 VITALS — BP 165/68 | HR 92 | Temp 98.5°F | Resp 18

## 2016-09-23 DIAGNOSIS — D631 Anemia in chronic kidney disease: Secondary | ICD-10-CM

## 2016-09-23 DIAGNOSIS — N183 Chronic kidney disease, stage 3 unspecified: Secondary | ICD-10-CM

## 2016-09-23 LAB — CBC
HCT: 32.4 % — ABNORMAL LOW (ref 36.0–46.0)
Hemoglobin: 11 g/dL — ABNORMAL LOW (ref 12.0–15.0)
MCH: 31.5 pg (ref 26.0–34.0)
MCHC: 34 g/dL (ref 30.0–36.0)
MCV: 92.8 fL (ref 78.0–100.0)
Platelets: 194 10*3/uL (ref 150–400)
RBC: 3.49 MIL/uL — ABNORMAL LOW (ref 3.87–5.11)
RDW: 14.5 % (ref 11.5–15.5)
WBC: 7.3 10*3/uL (ref 4.0–10.5)

## 2016-09-23 MED ORDER — DARBEPOETIN ALFA 60 MCG/0.3ML IJ SOSY
50.0000 ug | PREFILLED_SYRINGE | Freq: Once | INTRAMUSCULAR | Status: AC
  Administered 2016-09-23: 50 ug via SUBCUTANEOUS
  Filled 2016-09-23: qty 0.3

## 2016-09-23 NOTE — Progress Notes (Signed)
Laura Davenport presents today for injection per the provider's orders.  Aranesp administration without incident; see MAR for injection details.  Patient tolerated procedure well and without incident.  No questions or complaints noted at this time.

## 2016-09-23 NOTE — Patient Instructions (Signed)
[place] at [facility] Discharge Instructions  RECOMMENDATIONS MADE BY THE CONSULTANT AND ANY TEST RESULTS WILL BE SENT TO YOUR REFERRING PHYSICIAN.  Aranesp injection today (hemoglobin is 11). Return as scheduled for lab work and injections. Return as scheduled for office visit.   Thank you for choosing [place] at [facility] to provide your oncology and hematology care.  To afford each patient quality time with our provider, please arrive at least 15 minutes before your scheduled appointment time.    If you have a lab appointment with the [place] please come in thru the  Main Entrance and check in at the main information desk  You need to re-schedule your appointment should you arrive 10 or more minutes late.  We strive to give you quality time with our providers, and arriving late affects you and other patients whose appointments are after yours.  Also, if you no show three or more times for appointments you may be dismissed from the clinic at the providers discretion.     Again, thank you for choosing [facility].  Our hope is that these requests will decrease the amount of time that you wait before being seen by our physicians.       _____________________________________________________________  Should you have questions after your visit to [facility], please contact our office at [phone] between the hours of 8:30 a.m. and 4:30 p.m.  Voicemails left after 4:30 p.m. will not be returned until the following business day.  For prescription refill requests, have your pharmacy contact our office.       Resources For Cancer Patients and their Caregivers ? American Cancer Society: Can assist with transportation, wigs, general needs, runs Look Good Feel Better.        [phone] ? Cancer Care: Provides financial assistance, online support groups, medication/co-pay assistance.  1-800-813-HOPE  779-526-1059) ? [place] Assists [place] cancer patients and their families through emotional , educational and financial support.  618-391-8103 ? [place] DSS Where to apply for food stamps, Medicaid and utility assistance. 226 347 0898 ? RCATS: Transportation to medical appointments. (952)416-8629 ? Social Security Administration: May apply for disability if have a Stage IV cancer. 718-189-2956 9518672481 ? [employer], Disability and Transit Services: Assists with nutrition, care and transit needs. [place] Support Programs: @10RELATIVEDAYS @ > Cancer Support Group  2nd Tuesday of the month 1pm-2pm, Journey Room  > Creative Journey  3rd Tuesday of the month 1130am-1pm, Journey Room  > Look Good Feel Better  1st Wednesday of the month 10am-12 noon, Journey Room (Call [place] to register [phone])

## 2016-10-05 DIAGNOSIS — E119 Type 2 diabetes mellitus without complications: Secondary | ICD-10-CM | POA: Diagnosis not present

## 2016-10-11 ENCOUNTER — Other Ambulatory Visit (HOSPITAL_COMMUNITY): Payer: Self-pay | Admitting: *Deleted

## 2016-10-11 DIAGNOSIS — N183 Chronic kidney disease, stage 3 unspecified: Secondary | ICD-10-CM

## 2016-10-11 DIAGNOSIS — D631 Anemia in chronic kidney disease: Secondary | ICD-10-CM

## 2016-10-14 ENCOUNTER — Ambulatory Visit (HOSPITAL_COMMUNITY): Payer: Self-pay | Admitting: Oncology

## 2016-10-14 ENCOUNTER — Encounter (HOSPITAL_COMMUNITY): Payer: Medicare Other | Attending: Oncology

## 2016-10-14 DIAGNOSIS — D631 Anemia in chronic kidney disease: Secondary | ICD-10-CM | POA: Diagnosis not present

## 2016-10-14 DIAGNOSIS — N183 Chronic kidney disease, stage 3 (moderate): Secondary | ICD-10-CM | POA: Diagnosis not present

## 2016-10-14 LAB — CBC
HCT: 36.1 % (ref 36.0–46.0)
Hemoglobin: 11.6 g/dL — ABNORMAL LOW (ref 12.0–15.0)
MCH: 29.7 pg (ref 26.0–34.0)
MCHC: 32.1 g/dL (ref 30.0–36.0)
MCV: 92.3 fL (ref 78.0–100.0)
Platelets: 214 10*3/uL (ref 150–400)
RBC: 3.91 MIL/uL (ref 3.87–5.11)
RDW: 15 % (ref 11.5–15.5)
WBC: 7 10*3/uL (ref 4.0–10.5)

## 2016-10-14 NOTE — Progress Notes (Signed)
Aranesp held today for hemoglobin of 11.6

## 2016-10-22 ENCOUNTER — Other Ambulatory Visit (HOSPITAL_COMMUNITY): Payer: Self-pay | Admitting: *Deleted

## 2016-10-22 DIAGNOSIS — D631 Anemia in chronic kidney disease: Secondary | ICD-10-CM

## 2016-10-22 DIAGNOSIS — N189 Chronic kidney disease, unspecified: Principal | ICD-10-CM

## 2016-10-25 ENCOUNTER — Encounter (HOSPITAL_COMMUNITY): Payer: Medicare Other

## 2016-10-25 DIAGNOSIS — D631 Anemia in chronic kidney disease: Secondary | ICD-10-CM

## 2016-10-25 DIAGNOSIS — N183 Chronic kidney disease, stage 3 (moderate): Secondary | ICD-10-CM | POA: Diagnosis not present

## 2016-10-25 DIAGNOSIS — N189 Chronic kidney disease, unspecified: Principal | ICD-10-CM

## 2016-10-25 LAB — CBC
HCT: 34.3 % — ABNORMAL LOW (ref 36.0–46.0)
Hemoglobin: 11.3 g/dL — ABNORMAL LOW (ref 12.0–15.0)
MCH: 30.4 pg (ref 26.0–34.0)
MCHC: 32.9 g/dL (ref 30.0–36.0)
MCV: 92.2 fL (ref 78.0–100.0)
Platelets: 196 10*3/uL (ref 150–400)
RBC: 3.72 MIL/uL — ABNORMAL LOW (ref 3.87–5.11)
RDW: 14.6 % (ref 11.5–15.5)
WBC: 6.6 10*3/uL (ref 4.0–10.5)

## 2016-10-25 NOTE — Progress Notes (Signed)
Hemoglobin 11.3 today. Aranesp not given, treatment parameters not met. Return as scheduled.

## 2016-10-28 ENCOUNTER — Other Ambulatory Visit (HOSPITAL_COMMUNITY): Payer: Self-pay

## 2016-10-28 ENCOUNTER — Ambulatory Visit (HOSPITAL_COMMUNITY): Payer: Self-pay

## 2016-11-11 ENCOUNTER — Other Ambulatory Visit (HOSPITAL_COMMUNITY): Payer: Self-pay

## 2016-11-11 ENCOUNTER — Ambulatory Visit (HOSPITAL_COMMUNITY): Payer: Self-pay

## 2016-11-11 ENCOUNTER — Ambulatory Visit (HOSPITAL_COMMUNITY): Payer: Self-pay | Admitting: Oncology

## 2016-11-11 NOTE — Progress Notes (Deleted)
Laura Davenport, [place] [address]  No diagnosis found.  CURRENT THERAPY: Aranesp 50 mcg every 2 weeks (changing to every 3 weeks today, 06/14/2016)  INTERVAL HISTORY: Laura Davenport 83 y.o. female returns for followup of anemia of chronic renal disease, Stage III, on ESA therapy.  Additionally, ED shows erosive gastritis and moderate duodenitis. AND B12 deficiency requiring B12 injections 1000 mcg that are given at home.  She denies any complaints.  She is provided education regarding anemia of chronic renal disease and Aranesp therapy.  We discussed the risks, benefits, alternatives, and side effects of therapy.  She notes some urinary discomfort.  She admits to poor oral H2O.  She denies any pelvic pain, urinary frequency.  She does have a urine odor to her during exam.  She otherwise is doing well.    Review of Systems  Constitutional: Negative.  Negative for chills and fever.  HENT: Negative.   Eyes: Negative.   Respiratory: Negative.   Cardiovascular: Negative.   Gastrointestinal: Negative.   Genitourinary: Positive for dysuria. Negative for flank pain, frequency, hematuria and urgency.  Musculoskeletal: Negative.   Skin: Negative.   Neurological: Negative.   Endo/Heme/Allergies: Negative.   Psychiatric/Behavioral: Negative.     Past Medical History:  Diagnosis Date  . Anemia in chronic kidney disease 01/20/2016  . B12 deficiency anemia 10/25/2015  . Depression   . Essential hypertension   . GI bleed    Severe gastritis and duodenal ulcers by EGD May 2016   . HLD (hyperlipidemia)   . PSVT (paroxysmal supraventricular tachycardia) ([place])   . Septic arthritis (HCC)    MRSA, left knee   . Type 2 diabetes mellitus ([place])     Past Surgical History:  Procedure Laterality Date  . CHOLECYSTECTOMY N/A 11/04/2012   Procedure: LAPAROSCOPIC CHOLECYSTECTOMY;  Surgeon: Jamesetta So, MD;  Location: [facility];  Service: General;  Laterality:  N/A;  . ESOPHAGOGASTRODUODENOSCOPY N/A 01/06/2015   SLF: mild esophagitis  . I&D EXTREMITY Left 12/24/2014   Procedure: IRRIGATION AND DEBRIDEMENT EXTREMITY AND ARTHROSCOPY KNEE;  Surgeon: Renette Butters, MD;  Location: [place];  Service: Orthopedics;  Laterality: Left;  . KNEE SURGERY    . SAVORY DILATION  01/06/2015   Procedure: SAVORY DILATION;  Surgeon: Danie Binder, MD;  Location: [facility];  Service: Endoscopy;;    Family History  Problem Relation Age of Onset  . Family history unknown: Yes    Social History   Social History  . Marital status: Widowed    Spouse name: N/A  . Number of children: 22  . Years of education: N/A   Occupational History  . retired; [place] History Main Topics  . Smoking status: Never Smoker  . Smokeless tobacco: Never Used     Comment: Never smoked  . Alcohol use No  . Drug use: No  . Sexual activity: Not on file   Other Topics Concern  . Not on file   Social History Narrative   Lives alone   3 daughters & granddaughter nearby   Lost 1 son-strep           PHYSICAL EXAMINATION  ECOG PERFORMANCE STATUS: 2 - Symptomatic, <50% confined to bed  There were no vitals filed for this visit.  GENERAL:alert, no distress, comfortable, cooperative, smiling and accompanied by her daughter.  Malodorous urine smell noted on exam. SKIN: skin color, texture, turgor are normal, no  rashes or significant lesions HEAD: Normocephalic, No masses, lesions, tenderness or abnormalities EYES: normal, EOMI, Conjunctiva are pink and non-injected EARS: External ears normal OROPHARYNX:lips, buccal mucosa, and tongue normal and mucous membranes are moist  NECK: supple, trachea midline LYMPH:  not examined BREAST:not examined LUNGS: clear to auscultation  HEART: regular rate & rhythm ABDOMEN:abdomen soft and normal bowel sounds BACK: Back symmetric, no curvature. EXTREMITIES:less then 2 second capillary refill, no joint deformities,  effusion, or inflammation, no skin discoloration, no cyanosis  NEURO: alert & oriented x 3 with fluent speech, no focal motor/sensory deficits, in wheelchair    LABORATORY DATA: CBC    Component Value Date/Time   WBC 6.6 10/25/2016 0850   RBC 3.72 (L) 10/25/2016 0850   HGB 11.3 (L) 10/25/2016 0850   HCT 34.3 (L) 10/25/2016 0850   PLT 196 10/25/2016 0850   MCV 92.2 10/25/2016 0850   MCH 30.4 10/25/2016 0850   MCHC 32.9 10/25/2016 0850   RDW 14.6 10/25/2016 0850   LYMPHSABS 3.5 06/14/2016 1121   MONOABS 0.5 06/14/2016 1121   EOSABS 0.1 06/14/2016 1121   BASOSABS 0.0 06/14/2016 1121      Chemistry      Component Value Date/Time   NA 138 06/14/2016 1121   K 4.4 06/14/2016 1121   CL 109 06/14/2016 1121   CO2 25 06/14/2016 1121   BUN 34 (H) 06/14/2016 1121   CREATININE 1.38 (H) 06/14/2016 1121      Component Value Date/Time   CALCIUM 9.2 06/14/2016 1121   ALKPHOS 46 01/19/2016 1028   AST 13 (L) 01/19/2016 1028   ALT 8 (L) 01/19/2016 1028   BILITOT 0.4 01/19/2016 1028     Lab Results  Component Value Date   FERRITIN 419 (H) 06/26/2016     PENDING LABS:   RADIOGRAPHIC STUDIES:  No results found.   PATHOLOGY:    ASSESSMENT AND PLAN:  No problem-specific Assessment & Plan notes found for this encounter.   ORDERS PLACED FOR THIS ENCOUNTER: No orders of the defined types were placed in this encounter.   MEDICATIONS PRESCRIBED THIS ENCOUNTER: No orders of the defined types were placed in this encounter.   THERAPY PLAN:  Continue with B12 injections at home.  We will continue with ESA therapy as planned.  All questions were answered. The patient knows to call the clinic with any problems, questions or concerns. We can certainly see the patient much sooner if necessary.  Patient and plan discussed with Dr. Ancil Linsey and she is in agreement with the aforementioned.   This note is electronically signed by: Doy Mince 11/11/2016 8:23  AM

## 2016-11-12 ENCOUNTER — Encounter (HOSPITAL_COMMUNITY): Payer: Self-pay

## 2016-11-12 ENCOUNTER — Encounter (HOSPITAL_COMMUNITY): Payer: Medicare Other

## 2016-11-12 ENCOUNTER — Encounter (HOSPITAL_COMMUNITY): Payer: Medicare Other | Attending: Oncology

## 2016-11-12 VITALS — BP 159/88 | HR 89 | Temp 97.9°F | Resp 18

## 2016-11-12 DIAGNOSIS — N189 Chronic kidney disease, unspecified: Secondary | ICD-10-CM | POA: Insufficient documentation

## 2016-11-12 DIAGNOSIS — D631 Anemia in chronic kidney disease: Secondary | ICD-10-CM

## 2016-11-12 DIAGNOSIS — N183 Chronic kidney disease, stage 3 unspecified: Secondary | ICD-10-CM

## 2016-11-12 LAB — CBC
HCT: 32.9 % — ABNORMAL LOW (ref 36.0–46.0)
Hemoglobin: 11 g/dL — ABNORMAL LOW (ref 12.0–15.0)
MCH: 30.7 pg (ref 26.0–34.0)
MCHC: 33.4 g/dL (ref 30.0–36.0)
MCV: 91.9 fL (ref 78.0–100.0)
Platelets: 182 10*3/uL (ref 150–400)
RBC: 3.58 MIL/uL — ABNORMAL LOW (ref 3.87–5.11)
RDW: 14.7 % (ref 11.5–15.5)
WBC: 7.3 10*3/uL (ref 4.0–10.5)

## 2016-11-12 MED ORDER — DARBEPOETIN ALFA 60 MCG/0.3ML IJ SOSY
50.0000 ug | PREFILLED_SYRINGE | Freq: Once | INTRAMUSCULAR | Status: AC
  Administered 2016-11-12: 50 ug via SUBCUTANEOUS

## 2016-11-12 MED ORDER — DARBEPOETIN ALFA 60 MCG/0.3ML IJ SOSY
PREFILLED_SYRINGE | INTRAMUSCULAR | Status: AC
  Filled 2016-11-12: qty 0.3

## 2016-11-12 NOTE — Patient Instructions (Signed)
[facility] Discharge Instructions  RECOMMENDATIONS MADE BY THE CONSULTANT AND ANY TEST RESULTS WILL BE SENT TO YOUR REFERRING PHYSICIAN.  Aranesp today.    Thank you for choosing [facility] at [facility] to provide your oncology and hematology care.  To afford each patient quality time with our provider, please arrive at least 15 minutes before your scheduled appointment time.    If you have a lab appointment with the Cancer Center please come in thru the  Main Entrance and check in at the main information desk  You need to re-schedule your appointment should you arrive 10 or more minutes late.  We strive to give you quality time with our providers, and arriving late affects you and other patients whose appointments are after yours.  Also, if you no show three or more times for appointments you may be dismissed from the clinic at the providers discretion.     Again, thank you for choosing [facility].  Our hope is that these requests will decrease the amount of time that you wait before being seen by our physicians.       _____________________________________________________________  Should you have questions after your visit to [facility], please contact our office at [phone] between the hours of 8:30 a.m. and 4:30 p.m.  Voicemails left after 4:30 p.m. will not be returned until the following business day.  For prescription refill requests, have your pharmacy contact our office.       Resources For Cancer Patients and their Caregivers ? American Cancer Society: Can assist with transportation, wigs, general needs, runs Look Good Feel Better.        1-888-227-6333 ? Cancer Care: Provides financial assistance, online support groups, medication/co-pay assistance.  1-800-813-HOPE (4673) ? [facility] Assists [place] cancer patients and their families through emotional  , educational and financial support.  336-427-4357 ? [place] DSS Where to apply for food stamps, Medicaid and utility assistance. 336-342-1394 ? RCATS: Transportation to medical appointments. 336-347-2287 ? Social Security Administration: May apply for disability if have a Stage IV cancer. 336-342-7796 1-800-772-1213 ? [place] Aging, Disability and Transit Services: Assists with nutrition, care and transit needs. 336-349-2343  Cancer Center Support Programs: @10RELATIVEDAYS@ > Cancer Support Group  2nd Tuesday of the month 1pm-2pm, Journey Room  > Creative Journey  3rd Tuesday of the month 1130am-1pm, Journey Room  > Look Good Feel Better  1st Wednesday of the month 10am-12 noon, Journey Room (Call American [employer] to register 1-800-395-5775)    

## 2016-11-12 NOTE — Progress Notes (Signed)
Laura Davenport presents today for injection per MD orders. Aranesp 60mcg administered SQ in left Abdomen. Administration without incident. Patient tolerated well.

## 2016-11-21 DIAGNOSIS — N183 Chronic kidney disease, stage 3 (moderate): Secondary | ICD-10-CM | POA: Diagnosis not present

## 2016-11-21 DIAGNOSIS — Z79899 Other long term (current) drug therapy: Secondary | ICD-10-CM | POA: Diagnosis not present

## 2016-11-28 DIAGNOSIS — I10 Essential (primary) hypertension: Secondary | ICD-10-CM | POA: Diagnosis not present

## 2016-11-28 DIAGNOSIS — N183 Chronic kidney disease, stage 3 (moderate): Secondary | ICD-10-CM | POA: Diagnosis not present

## 2016-11-28 DIAGNOSIS — D509 Iron deficiency anemia, unspecified: Secondary | ICD-10-CM | POA: Diagnosis not present

## 2016-12-03 ENCOUNTER — Encounter (HOSPITAL_COMMUNITY): Payer: Medicare Other | Attending: Oncology

## 2016-12-03 ENCOUNTER — Encounter (HOSPITAL_COMMUNITY): Payer: Medicare Other

## 2016-12-03 DIAGNOSIS — D631 Anemia in chronic kidney disease: Secondary | ICD-10-CM | POA: Insufficient documentation

## 2016-12-03 DIAGNOSIS — D519 Vitamin B12 deficiency anemia, unspecified: Secondary | ICD-10-CM | POA: Diagnosis not present

## 2016-12-03 DIAGNOSIS — N183 Chronic kidney disease, stage 3 (moderate): Secondary | ICD-10-CM | POA: Insufficient documentation

## 2016-12-03 DIAGNOSIS — N189 Chronic kidney disease, unspecified: Principal | ICD-10-CM

## 2016-12-03 LAB — CBC
HCT: 32.5 % — ABNORMAL LOW (ref 36.0–46.0)
Hemoglobin: 11.2 g/dL — ABNORMAL LOW (ref 12.0–15.0)
MCH: 32.6 pg (ref 26.0–34.0)
MCHC: 34.5 g/dL (ref 30.0–36.0)
MCV: 94.5 fL (ref 78.0–100.0)
Platelets: 218 10*3/uL (ref 150–400)
RBC: 3.44 MIL/uL — ABNORMAL LOW (ref 3.87–5.11)
RDW: 14.8 % (ref 11.5–15.5)
WBC: 6.3 10*3/uL (ref 4.0–10.5)

## 2016-12-03 NOTE — Progress Notes (Signed)
Patient didn't need Aranesp today due to a hemoglobin of 11.2.

## 2016-12-10 ENCOUNTER — Ambulatory Visit (HOSPITAL_COMMUNITY): Payer: Self-pay | Admitting: Oncology

## 2016-12-11 ENCOUNTER — Encounter (HOSPITAL_COMMUNITY): Payer: Self-pay | Admitting: Oncology

## 2016-12-11 ENCOUNTER — Encounter (HOSPITAL_BASED_OUTPATIENT_CLINIC_OR_DEPARTMENT_OTHER): Payer: Medicare Other | Admitting: Oncology

## 2016-12-11 VITALS — BP 180/90 | HR 107 | Temp 98.3°F | Resp 16 | Wt 139.4 lb

## 2016-12-11 DIAGNOSIS — N183 Chronic kidney disease, stage 3 unspecified: Secondary | ICD-10-CM

## 2016-12-11 DIAGNOSIS — D519 Vitamin B12 deficiency anemia, unspecified: Secondary | ICD-10-CM

## 2016-12-11 DIAGNOSIS — D631 Anemia in chronic kidney disease: Secondary | ICD-10-CM

## 2016-12-11 NOTE — Progress Notes (Signed)
Laura Davenport, [address] [place] [address]  Anemia in stage 3 chronic kidney disease - Plan: CBC with Differential, Basic metabolic panel, Iron and TIBC, Ferritin, CBC, CANCELED: CBC  Anemia due to vitamin B12 deficiency, unspecified B12 deficiency type - Plan: Vitamin B12  Chronic renal disease, stage 3, moderately decreased glomerular filtration rate (GFR) between 30-59 mL/min/1.73 square meter - Plan: CBC with Differential, Basic metabolic panel, Iron and TIBC, Ferritin, CANCELED: CBC  CURRENT THERAPY: Aranesp 50 mcg every 3 weeks (changing to every 4 weeks today, 12/11/2016).  B12 injections monthly at home.  INTERVAL HISTORY: Laura Davenport 83 y.o. female returns for followup of anemia of chronic renal disease, Stage III, on ESA therapy.  Additionally, EGD shows erosive gastritis and moderate duodenitis. AND B12 deficiency requiring B12 injections 1000 mcg that are given at home.  She is doing very well from hematology standpoint.  We reviewed her recent history regarding blood work and Aranesp injections.  It is noted that she has responded wonderfully to Aranesp therapy and at times hemoglobin is above treatment parameters requiring [place] to hold injection.  The patient denies any active complaints at this time.  Hypertension above baseline is noted today.  Patient reports that this is a chronic issue and happens from time to time.  She is asymptomatic.  She is tolerating her B12 injections at home very well without any complications.  These are administered on a monthly basis.  She denies any urinary complaints today.  Review of Systems  Constitutional: Negative.  Negative for chills, fever and weight loss.  HENT: Negative.   Eyes: Negative.   Respiratory: Negative.  Negative for cough.   Cardiovascular: Negative.  Negative for chest pain.  Gastrointestinal: Negative.  Negative for blood in stool, constipation, diarrhea, melena, nausea and vomiting.    Genitourinary: Negative.  Negative for dysuria, flank pain, frequency, hematuria and urgency.  Musculoskeletal: Negative.   Skin: Negative.   Neurological: Negative.  Negative for dizziness, tingling, sensory change, speech change, focal weakness, weakness and headaches.  Endo/Heme/Allergies: Negative.   Psychiatric/Behavioral: Negative.     Past Medical History:  Diagnosis Date  . Anemia in chronic kidney disease 01/20/2016  . B12 deficiency anemia 10/25/2015  . Chronic renal disease, stage 3, moderately decreased glomerular filtration rate (GFR) between 30-59 mL/min/1.73 square meter 01/07/2016  . Depression   . Essential hypertension   . GI bleed    Severe gastritis and duodenal ulcers by EGD May 2016   . HLD (hyperlipidemia)   . PSVT (paroxysmal supraventricular tachycardia) ([place])   . Septic arthritis (HCC)    MRSA, left knee   . Type 2 diabetes mellitus ([place])     Past Surgical History:  Procedure Laterality Date  . CHOLECYSTECTOMY N/A 11/04/2012   Procedure: LAPAROSCOPIC CHOLECYSTECTOMY;  Surgeon: Jamesetta So, MD;  Location: [facility];  Service: General;  Laterality: N/A;  . ESOPHAGOGASTRODUODENOSCOPY N/A 01/06/2015   SLF: mild esophagitis  . I&D EXTREMITY Left 12/24/2014   Procedure: IRRIGATION AND DEBRIDEMENT EXTREMITY AND ARTHROSCOPY KNEE;  Surgeon: Renette Butters, MD;  Location: [facility];  Service: Orthopedics;  Laterality: Left;  . KNEE SURGERY    . SAVORY DILATION  01/06/2015   Procedure: SAVORY DILATION;  Surgeon: Danie Binder, MD;  Location: [facility];  Service: Endoscopy;;    Family History  Problem Relation Age of Onset  . Family history unknown: Yes    Social History   Social History  .  Marital status: Widowed    Spouse name: N/A  . Number of children: 70  . Years of education: N/A   Occupational History  . retired; [place] History Main Topics  . Smoking status: Never Smoker  . Smokeless tobacco: Never Used     Comment: Never smoked   . Alcohol use No  . Drug use: No  . Sexual activity: Not Asked   Other Topics Concern  . None   Social History Narrative   Lives alone   3 daughters & granddaughter nearby   Lost 1 son-strep           PHYSICAL EXAMINATION  ECOG PERFORMANCE STATUS: 2 - Symptomatic, <50% confined to bed  Vitals:   12/11/16 0948  BP: (!) 180/90  Pulse: (!) 107  Resp: 16  Temp: 98.3 F (36.8 C)    GENERAL:alert, no distress, comfortable, cooperative, smiling and accompanied by her daughter.  Malodorous urine smell noted on exam. SKIN: skin color, texture, turgor are normal, no rashes or significant lesions HEAD: Normocephalic, No masses, lesions, tenderness or abnormalities EYES: normal, EOMI, Conjunctiva are pink and non-injected EARS: External ears normal OROPHARYNX:lips, buccal mucosa, and tongue normal and mucous membranes are moist  NECK: supple, trachea midline LYMPH:  not examined BREAST:not examined LUNGS: clear to auscultation without wheezes, rales, or rhonchi. HEART: regular rate & rhythm without murmur, rub, or gallop.  Normal S1 and S2. ABDOMEN:abdomen soft and normal bowel sounds BACK: Back symmetric, no curvature. EXTREMITIES:less then 2 second capillary refill, no joint deformities, effusion, or inflammation, no skin discoloration, no cyanosis  NEURO: alert & oriented x 3 with fluent speech, no focal motor/sensory deficits, in wheelchair    LABORATORY DATA: CBC    Component Value Date/Time   WBC 6.3 12/03/2016 0906   RBC 3.44 (L) 12/03/2016 0906   HGB 11.2 (L) 12/03/2016 0906   HCT 32.5 (L) 12/03/2016 0906   PLT 218 12/03/2016 0906   MCV 94.5 12/03/2016 0906   MCH 32.6 12/03/2016 0906   MCHC 34.5 12/03/2016 0906   RDW 14.8 12/03/2016 0906   LYMPHSABS 3.5 06/14/2016 1121   MONOABS 0.5 06/14/2016 1121   EOSABS 0.1 06/14/2016 1121   BASOSABS 0.0 06/14/2016 1121      Chemistry      Component Value Date/Time   NA 138 06/14/2016 1121   K 4.4 06/14/2016 1121    CL 109 06/14/2016 1121   CO2 25 06/14/2016 1121   BUN 34 (H) 06/14/2016 1121   CREATININE 1.38 (H) 06/14/2016 1121      Component Value Date/Time   CALCIUM 9.2 06/14/2016 1121   ALKPHOS 46 01/19/2016 1028   AST 13 (L) 01/19/2016 1028   ALT 8 (L) 01/19/2016 1028   BILITOT 0.4 01/19/2016 1028     Lab Results  Component Value Date   FERRITIN 419 (H) 06/26/2016   Lab Results  Component Value Date   VITAMINB12 792 06/14/2016    PENDING LABS:   RADIOGRAPHIC STUDIES:  No results found.   PATHOLOGY:    ASSESSMENT AND PLAN:  Anemia in chronic kidney disease Anemia of chronic renal disease, Stage III, on Aranesp 50 mcg.  Bone marrow aspiration and biopsy has been discussed, but she has responded to ESA therapy well thus far and therefore is not indicated at his time.  Labs in 3 weeks: CBC.  I personally reviewed and went over laboratory results with the patient.  The results are noted within this dictation.  Labs from 12/03/2016 reviewed: CBC.  I personally reviewed and went over laboratory results with the patient.  The results are noted within this dictation.  Labs every 4 weeks: CBC Labs in 16 weeks: CBC diff, BMET, iron/TIBC, ferritin.  Given her response to Aranesp and episodes of HGB > 11.1 g/dL, we will decrease frequency of lab appointments and Aranesp injection appointments every 4 weeks.  We will manipulate plan moving forward based upon her response to this change in supportive therapy plan.  Previous supportive therapy plan is discontinued and new supportive therapy plan is built reflecting this change.  Since last being seen in the clinic on 06/14/2016, patient has reported to the clinic every 2 weeks for laboratory work with consideration of Aranesp injection as outlined below.  With each appointment patient was assessed by nurse and blood pressure was reviewed prior to administering injection requiring provider to approve. Oncology Flowsheet 06/26/2016  07/17/2016 08/07/2016 09/23/2016  Darbepoetin Alfa (ARANESP) [place] 50 mcg 50 mcg 50 mcg 50 mcg   Oncology Flowsheet 11/12/2016  Darbepoetin Alfa (ARANESP) [place] 50 mcg   Problem list reviewed with patient and edited accordingly.  Medications are reviewed with the patient and edited accordingly.  Return in 16 weeks for follow-up.  More than 50% of the time spent with the patient was utilized for counseling and coordination of care.  B12 deficiency anemia B12 deficiency requiring B12 injections 1000 mcg that are given at home.  Labs in 16 weeks: B12  Chronic renal disease, stage 3, moderately decreased glomerular filtration rate (GFR) between 30-59 mL/min/1.73 square meter Chronic renal disease, stable.  Stage III   ORDERS PLACED FOR THIS ENCOUNTER: Orders Placed This Encounter  Procedures  . CBC with Differential  . Basic metabolic panel  . Iron and TIBC  . Ferritin  . CBC  . Vitamin B12    MEDICATIONS PRESCRIBED THIS ENCOUNTER: No orders of the defined types were placed in this encounter.   THERAPY PLAN:  Continue with B12 injections at home.  We will continue with ESA therapy as planned and manipulate supportive therapy plan according to response in therapy.  All questions were answered. The patient knows to call the clinic with any problems, questions or concerns. We can certainly see the patient much sooner if necessary.  Patient and plan discussed with Dr. Twana First and she is in agreement with the aforementioned.   This note is electronically signed by: Doy Mince 12/11/2016 10:31 AM

## 2016-12-11 NOTE — Assessment & Plan Note (Addendum)
Anemia of chronic renal disease, Stage III, on Aranesp 50 mcg.  Bone marrow aspiration and biopsy has been discussed, but she has responded to ESA therapy well thus far and therefore is not indicated at his time.  Labs in 3 weeks: CBC.  I personally reviewed and went over laboratory results with the patient.  The results are noted within this dictation.    Labs from 12/03/2016 reviewed: CBC.  I personally reviewed and went over laboratory results with the patient.  The results are noted within this dictation.  Labs every 4 weeks: CBC Labs in 16 weeks: CBC diff, BMET, iron/TIBC, ferritin.  Given her response to Aranesp and episodes of HGB > 11.1 g/dL, we will decrease frequency of lab appointments and Aranesp injection appointments every 4 weeks.  We will manipulate plan moving forward based upon her response to this change in supportive therapy plan.  Previous supportive therapy plan is discontinued and new supportive therapy plan is built reflecting this change.  Since last being seen in the clinic on 06/14/2016, patient has reported to the clinic every 2 weeks for laboratory work with consideration of Aranesp injection as outlined below.  With each appointment patient was assessed by nurse and blood pressure was reviewed prior to administering injection requiring provider to approve. Oncology Flowsheet 06/26/2016 07/17/2016 08/07/2016 09/23/2016  Darbepoetin Alfa (ARANESP) [place] 50 mcg 50 mcg 50 mcg 50 mcg   Oncology Flowsheet 11/12/2016  Darbepoetin Alfa (ARANESP) [place] 50 mcg   Problem list reviewed with patient and edited accordingly.  Medications are reviewed with the patient and edited accordingly.  Return in 16 weeks for follow-up.  More than 50% of the time spent with the patient was utilized for counseling and coordination of care.

## 2016-12-11 NOTE — Assessment & Plan Note (Addendum)
B12 deficiency requiring B12 injections 1000 mcg that are given at home.  Labs in 16 weeks: B12

## 2016-12-11 NOTE — Assessment & Plan Note (Signed)
Chronic renal disease, stable.  Stage III

## 2016-12-11 NOTE — Patient Instructions (Addendum)
[place] at [facility] Discharge Instructions  RECOMMENDATIONS MADE BY THE CONSULTANT AND ANY TEST RESULTS WILL BE SENT TO YOUR REFERRING PHYSICIAN.  You saw [name], PA-C, today. No labs or Aranesp today. Labs and aranesp in 3 weeks. Then labs and aranesp will be every 4 weeks. Follow up in 4 months Continue monthly B12 injections at home. See Amy at checkout for appointments.  Thank you for choosing [place] at [facility] to provide your oncology and hematology care.  To afford each patient quality time with our provider, please arrive at least 15 minutes before your scheduled appointment time.    If you have a lab appointment with the [facility] please come in thru the  Main Entrance and check in at the main information desk  You need to re-schedule your appointment should you arrive 10 or more minutes late.  We strive to give you quality time with our providers, and arriving late affects you and other patients whose appointments are after yours.  Also, if you no show three or more times for appointments you may be dismissed from the clinic at the providers discretion.     Again, thank you for choosing [facility].  Our hope is that these requests will decrease the amount of time that you wait before being seen by our physicians.       _____________________________________________________________  Should you have questions after your visit to [facility], please contact our office at [phone] between the hours of 8:30 a.m. and 4:30 p.m.  Voicemails left after 4:30 p.m. will not be returned until the following business day.  For prescription refill requests, have your pharmacy contact our office.       Resources For Cancer Patients and their Caregivers ? American Cancer Society: Can assist with transportation, wigs, general needs, runs Look Good Feel Better.        [phone] ? Cancer  Care: Provides financial assistance, online support groups, medication/co-pay assistance.  1-800-813-HOPE 952-561-0194) ? [facility] Assists [place] cancer patients and their families through emotional , educational and financial support.  587-777-3953 ? [place] DSS Where to apply for food stamps, Medicaid and utility assistance. 408-808-3334 ? RCATS: Transportation to medical appointments. 463-070-3708 ? Social Security Administration: May apply for disability if have a Stage IV cancer. 386-275-1591 617-861-9251 ? [employer], Disability and Transit Services: Assists with nutrition, care and transit needs. [place] Support Programs: @10RELATIVEDAYS @ > Cancer Support Group  2nd Tuesday of the month 1pm-2pm, Journey Room  > Creative Journey  3rd Tuesday of the month 1130am-1pm, Journey Room  > Look Good Feel Better  1st Wednesday of the month 10am-12 noon, Journey Room (Call [place] to register [phone])

## 2016-12-21 ENCOUNTER — Other Ambulatory Visit (HOSPITAL_COMMUNITY): Payer: Self-pay | Admitting: Hematology & Oncology

## 2016-12-21 DIAGNOSIS — D519 Vitamin B12 deficiency anemia, unspecified: Secondary | ICD-10-CM

## 2017-01-01 ENCOUNTER — Other Ambulatory Visit (HOSPITAL_COMMUNITY): Payer: Self-pay

## 2017-01-01 ENCOUNTER — Telehealth (HOSPITAL_COMMUNITY): Payer: Self-pay

## 2017-01-01 ENCOUNTER — Ambulatory Visit (HOSPITAL_COMMUNITY): Payer: Self-pay

## 2017-01-01 NOTE — Telephone Encounter (Signed)
See telephone encounter note.

## 2017-01-03 ENCOUNTER — Encounter (HOSPITAL_COMMUNITY): Payer: Self-pay

## 2017-01-03 ENCOUNTER — Encounter (HOSPITAL_COMMUNITY): Payer: Medicare Other | Attending: Oncology

## 2017-01-03 ENCOUNTER — Encounter (HOSPITAL_BASED_OUTPATIENT_CLINIC_OR_DEPARTMENT_OTHER): Payer: Medicare Other

## 2017-01-03 VITALS — BP 175/80 | HR 87 | Temp 98.7°F | Resp 18

## 2017-01-03 DIAGNOSIS — D631 Anemia in chronic kidney disease: Secondary | ICD-10-CM

## 2017-01-03 DIAGNOSIS — N183 Chronic kidney disease, stage 3 unspecified: Secondary | ICD-10-CM

## 2017-01-03 LAB — CBC
HCT: 32.1 % — ABNORMAL LOW (ref 36.0–46.0)
Hemoglobin: 10.5 g/dL — ABNORMAL LOW (ref 12.0–15.0)
MCH: 30.3 pg (ref 26.0–34.0)
MCHC: 32.7 g/dL (ref 30.0–36.0)
MCV: 92.5 fL (ref 78.0–100.0)
Platelets: 211 10*3/uL (ref 150–400)
RBC: 3.47 MIL/uL — ABNORMAL LOW (ref 3.87–5.11)
RDW: 14 % (ref 11.5–15.5)
WBC: 7.5 10*3/uL (ref 4.0–10.5)

## 2017-01-03 MED ORDER — DARBEPOETIN ALFA 60 MCG/0.3ML IJ SOSY
PREFILLED_SYRINGE | INTRAMUSCULAR | Status: AC
  Filled 2017-01-03: qty 0.3

## 2017-01-03 MED ORDER — DARBEPOETIN ALFA 60 MCG/0.3ML IJ SOSY
50.0000 ug | PREFILLED_SYRINGE | Freq: Once | INTRAMUSCULAR | Status: AC
  Administered 2017-01-03: 50 ug via SUBCUTANEOUS

## 2017-01-03 NOTE — Progress Notes (Signed)
Margaretha Seeds presents today for injection per MD orders. Spoke with Mike Craze, NP about patients increased blood pressure. Okay to proceed with injection, blood pressure is not outside of patient's normal range. Aranesp 50 mcg administered SQ in left Abdomen. Administration without incident. Patient tolerated well. Patient discharged via wheelchair stable condition from clinic.

## 2017-01-03 NOTE — Patient Instructions (Signed)
[place] at [facility] Discharge Instructions  RECOMMENDATIONS MADE BY THE CONSULTANT AND ANY TEST RESULTS WILL BE SENT TO YOUR REFERRING PHYSICIAN.  You got your aranesp injection today You will get it every month  Thank you for choosing [place] at [facility] to provide your oncology and hematology care.  To afford each patient quality time with our provider, please arrive at least 15 minutes before your scheduled appointment time.    If you have a lab appointment with the [facility] please come in thru the  Main Entrance and check in at the main information desk  You need to re-schedule your appointment should you arrive 10 or more minutes late.  We strive to give you quality time with our providers, and arriving late affects you and other patients whose appointments are after yours.  Also, if you no show three or more times for appointments you may be dismissed from the clinic at the providers discretion.     Again, thank you for choosing [facility].  Our hope is that these requests will decrease the amount of time that you wait before being seen by our physicians.       _____________________________________________________________  Should you have questions after your visit to [facility], please contact our office at [phone] between the hours of 8:30 a.m. and 4:30 p.m.  Voicemails left after 4:30 p.m. will not be returned until the following business day.  For prescription refill requests, have your pharmacy contact our office.       Resources For Cancer Patients and their Caregivers ? American Cancer Society: Can assist with transportation, wigs, general needs, runs Look Good Feel Better.        (270)087-5596 ? Cancer Care: Provides financial assistance, online support groups, medication/co-pay assistance.  1-800-813-HOPE 418-495-4648) ? [facility] Assists [place]  cancer patients and their families through emotional , educational and financial support.  402-108-5915 ? [place] DSS Where to apply for food stamps, Medicaid and utility assistance. 986 794 4814 ? RCATS: Transportation to medical appointments. 276-665-7649 ? Social Security Administration: May apply for disability if have a Stage IV cancer. 747-290-6263 (941) 732-9406 ? [employer], Disability and Transit Services: Assists with nutrition, care and transit needs. [place] Support Programs: @10RELATIVEDAYS @ > Cancer Support Group  2nd Tuesday of the month 1pm-2pm, Journey Room  > Creative Journey  3rd Tuesday of the month 1130am-1pm, Journey Room  > Look Good Feel Better  1st Wednesday of the month 10am-12 noon, Journey Room (Call [place] to register [phone])

## 2017-01-04 DIAGNOSIS — E119 Type 2 diabetes mellitus without complications: Secondary | ICD-10-CM | POA: Diagnosis not present

## 2017-01-08 ENCOUNTER — Ambulatory Visit (HOSPITAL_COMMUNITY): Payer: Self-pay

## 2017-01-08 ENCOUNTER — Other Ambulatory Visit (HOSPITAL_COMMUNITY): Payer: Self-pay

## 2017-01-29 ENCOUNTER — Encounter (HOSPITAL_COMMUNITY): Payer: Medicare Other

## 2017-01-29 DIAGNOSIS — D631 Anemia in chronic kidney disease: Secondary | ICD-10-CM | POA: Diagnosis not present

## 2017-01-29 DIAGNOSIS — N183 Chronic kidney disease, stage 3 (moderate): Principal | ICD-10-CM

## 2017-01-29 LAB — CBC
HCT: 33.5 % — ABNORMAL LOW (ref 36.0–46.0)
Hemoglobin: 11.3 g/dL — ABNORMAL LOW (ref 12.0–15.0)
MCH: 31.5 pg (ref 26.0–34.0)
MCHC: 33.7 g/dL (ref 30.0–36.0)
MCV: 93.3 fL (ref 78.0–100.0)
Platelets: 203 10*3/uL (ref 150–400)
RBC: 3.59 MIL/uL — ABNORMAL LOW (ref 3.87–5.11)
RDW: 14 % (ref 11.5–15.5)
WBC: 7.4 10*3/uL (ref 4.0–10.5)

## 2017-01-29 NOTE — Progress Notes (Signed)
Patient did not receive Aranesp injection today since Hgb was 11.3.Pt discharged via wheelchair in satisfactory condition accompanied by her daughter

## 2017-02-26 ENCOUNTER — Encounter (HOSPITAL_COMMUNITY): Payer: Medicare Other

## 2017-02-26 ENCOUNTER — Encounter (HOSPITAL_COMMUNITY): Payer: Medicare Other | Attending: Oncology

## 2017-02-26 DIAGNOSIS — D631 Anemia in chronic kidney disease: Secondary | ICD-10-CM | POA: Insufficient documentation

## 2017-02-26 DIAGNOSIS — N183 Chronic kidney disease, stage 3 (moderate): Secondary | ICD-10-CM | POA: Diagnosis not present

## 2017-02-26 LAB — CBC
HCT: 33.9 % — ABNORMAL LOW (ref 36.0–46.0)
Hemoglobin: 11.5 g/dL — ABNORMAL LOW (ref 12.0–15.0)
MCH: 31 pg (ref 26.0–34.0)
MCHC: 33.9 g/dL (ref 30.0–36.0)
MCV: 91.4 fL (ref 78.0–100.0)
Platelets: 195 10*3/uL (ref 150–400)
RBC: 3.71 MIL/uL — ABNORMAL LOW (ref 3.87–5.11)
RDW: 13.8 % (ref 11.5–15.5)
WBC: 7 10*3/uL (ref 4.0–10.5)

## 2017-02-26 NOTE — Progress Notes (Signed)
HEMOGLOBIN 11.5. Aranesp not given treatment parameters not met. Return as scheduled.

## 2017-03-14 DIAGNOSIS — E039 Hypothyroidism, unspecified: Secondary | ICD-10-CM | POA: Diagnosis not present

## 2017-03-14 DIAGNOSIS — N183 Chronic kidney disease, stage 3 (moderate): Secondary | ICD-10-CM | POA: Diagnosis not present

## 2017-03-14 DIAGNOSIS — I10 Essential (primary) hypertension: Secondary | ICD-10-CM | POA: Diagnosis not present

## 2017-03-14 DIAGNOSIS — E119 Type 2 diabetes mellitus without complications: Secondary | ICD-10-CM | POA: Diagnosis not present

## 2017-03-14 DIAGNOSIS — Z79899 Other long term (current) drug therapy: Secondary | ICD-10-CM | POA: Diagnosis not present

## 2017-03-21 DIAGNOSIS — E119 Type 2 diabetes mellitus without complications: Secondary | ICD-10-CM | POA: Diagnosis not present

## 2017-03-21 DIAGNOSIS — I10 Essential (primary) hypertension: Secondary | ICD-10-CM | POA: Diagnosis not present

## 2017-03-21 DIAGNOSIS — N183 Chronic kidney disease, stage 3 (moderate): Secondary | ICD-10-CM | POA: Diagnosis not present

## 2017-03-24 ENCOUNTER — Emergency Department (HOSPITAL_COMMUNITY): Payer: Medicare Other

## 2017-03-24 ENCOUNTER — Encounter (HOSPITAL_COMMUNITY): Payer: Self-pay | Admitting: *Deleted

## 2017-03-24 ENCOUNTER — Other Ambulatory Visit: Payer: Self-pay

## 2017-03-24 ENCOUNTER — Observation Stay (HOSPITAL_COMMUNITY)
Admission: EM | Admit: 2017-03-24 | Discharge: 2017-03-26 | Disposition: A | Discharge status: Home / Self Care | Payer: Medicare Other | Attending: Internal Medicine | Admitting: Internal Medicine

## 2017-03-24 DIAGNOSIS — R4182 Altered mental status, unspecified: Principal | ICD-10-CM | POA: Diagnosis present

## 2017-03-24 DIAGNOSIS — N390 Urinary tract infection, site not specified: Secondary | ICD-10-CM | POA: Insufficient documentation

## 2017-03-24 DIAGNOSIS — I10 Essential (primary) hypertension: Secondary | ICD-10-CM | POA: Diagnosis present

## 2017-03-24 DIAGNOSIS — E1122 Type 2 diabetes mellitus with diabetic chronic kidney disease: Secondary | ICD-10-CM | POA: Insufficient documentation

## 2017-03-24 DIAGNOSIS — R404 Transient alteration of awareness: Secondary | ICD-10-CM | POA: Diagnosis not present

## 2017-03-24 DIAGNOSIS — N189 Chronic kidney disease, unspecified: Secondary | ICD-10-CM | POA: Diagnosis present

## 2017-03-24 DIAGNOSIS — E119 Type 2 diabetes mellitus without complications: Secondary | ICD-10-CM

## 2017-03-24 DIAGNOSIS — N183 Chronic kidney disease, stage 3 unspecified: Secondary | ICD-10-CM | POA: Diagnosis present

## 2017-03-24 DIAGNOSIS — Z79899 Other long term (current) drug therapy: Secondary | ICD-10-CM | POA: Insufficient documentation

## 2017-03-24 DIAGNOSIS — D649 Anemia, unspecified: Secondary | ICD-10-CM | POA: Diagnosis present

## 2017-03-24 DIAGNOSIS — I129 Hypertensive chronic kidney disease with stage 1 through stage 4 chronic kidney disease, or unspecified chronic kidney disease: Secondary | ICD-10-CM | POA: Insufficient documentation

## 2017-03-24 DIAGNOSIS — D631 Anemia in chronic kidney disease: Secondary | ICD-10-CM | POA: Diagnosis present

## 2017-03-24 DIAGNOSIS — R531 Weakness: Secondary | ICD-10-CM | POA: Diagnosis not present

## 2017-03-24 DIAGNOSIS — R4781 Slurred speech: Secondary | ICD-10-CM | POA: Diagnosis not present

## 2017-03-24 DIAGNOSIS — E785 Hyperlipidemia, unspecified: Secondary | ICD-10-CM | POA: Diagnosis present

## 2017-03-24 LAB — CBC WITH DIFFERENTIAL/PLATELET
Basophils Absolute: 0 10*3/uL (ref 0.0–0.1)
Basophils Relative: 0 %
Eosinophils Absolute: 0.1 10*3/uL (ref 0.0–0.7)
Eosinophils Relative: 1 %
HCT: 31 % — ABNORMAL LOW (ref 36.0–46.0)
Hemoglobin: 11 g/dL — ABNORMAL LOW (ref 12.0–15.0)
Lymphocytes Relative: 47 %
Lymphs Abs: 3 10*3/uL (ref 0.7–4.0)
MCH: 33.5 pg (ref 26.0–34.0)
MCHC: 35.5 g/dL (ref 30.0–36.0)
MCV: 94.5 fL (ref 78.0–100.0)
Monocytes Absolute: 0.3 10*3/uL (ref 0.1–1.0)
Monocytes Relative: 5 %
Neutro Abs: 3.1 10*3/uL (ref 1.7–7.7)
Neutrophils Relative %: 47 %
Platelets: 195 10*3/uL (ref 150–400)
RBC: 3.28 MIL/uL — ABNORMAL LOW (ref 3.87–5.11)
RDW: 14.3 % (ref 11.5–15.5)
WBC: 6.5 10*3/uL (ref 4.0–10.5)

## 2017-03-24 LAB — COMPREHENSIVE METABOLIC PANEL
ALT: 8 U/L — ABNORMAL LOW (ref 14–54)
AST: 16 U/L (ref 15–41)
Albumin: 3.7 g/dL (ref 3.5–5.0)
Alkaline Phosphatase: 65 U/L (ref 38–126)
Anion gap: 8 (ref 5–15)
BUN: 28 mg/dL — ABNORMAL HIGH (ref 6–20)
CO2: 25 mmol/L (ref 22–32)
Calcium: 9.3 mg/dL (ref 8.9–10.3)
Chloride: 108 mmol/L (ref 101–111)
Creatinine, Ser: 1.35 mg/dL — ABNORMAL HIGH (ref 0.44–1.00)
GFR calc Af Amer: 41 mL/min — ABNORMAL LOW (ref >60)
GFR calc non Af Amer: 35 mL/min — ABNORMAL LOW (ref >60)
Glucose, Bld: 126 mg/dL — ABNORMAL HIGH (ref 65–99)
Potassium: 3.7 mmol/L (ref 3.5–5.1)
Sodium: 141 mmol/L (ref 135–145)
Total Bilirubin: 0.4 mg/dL (ref 0.3–1.2)
Total Protein: 7.6 g/dL (ref 6.5–8.1)

## 2017-03-24 LAB — URINALYSIS, ROUTINE W REFLEX MICROSCOPIC
Bilirubin Urine: NEGATIVE
Glucose, UA: NEGATIVE mg/dL
Ketones, ur: NEGATIVE mg/dL
Nitrite: NEGATIVE
Protein, ur: 100 mg/dL — AB
Specific Gravity, Urine: 1.01 (ref 1.005–1.030)
pH: 7 (ref 5.0–8.0)

## 2017-03-24 LAB — LACTIC ACID, PLASMA: Lactic Acid, Venous: 0.9 mmol/L (ref 0.5–1.9)

## 2017-03-24 LAB — TROPONIN I: Troponin I: <0.03 ng/mL (ref <0.03)

## 2017-03-24 MED ORDER — DILTIAZEM HCL 30 MG PO TABS
30.0000 mg | ORAL_TABLET | Freq: Once | ORAL | Status: AC
  Administered 2017-03-24: 30 mg via ORAL
  Filled 2017-03-24: qty 1

## 2017-03-24 MED ORDER — DEXTROSE 5 % IV SOLN
1.0000 g | Freq: Once | INTRAVENOUS | Status: AC
  Administered 2017-03-24: 1 g via INTRAVENOUS
  Filled 2017-03-24: qty 10

## 2017-03-24 NOTE — ED Triage Notes (Signed)
Pt brought in by [facility] for c/o possible stroke; daughter reported to ems that pt sounded like she had slurred speech while on the phone; pt states she feels fine, denies any pain; pt states she did not take her BP meds today; cbg with ems 151

## 2017-03-24 NOTE — ED Provider Notes (Signed)
[facility] DEPT Provider Note   CSN: [id number] Arrival date & time: 03/24/17  2119     History   Chief Complaint Chief Complaint  Patient presents with  . Aphasia    HPI Laura Davenport is a 83 y.o. female.  HPI  Pt was seen at 2125. Per EMS, pt's family and pt, c/o gradual onset and persistence of constant "not talking right" that began earlier today (unknown exact time). Pt's family describes this as "not acting right," "talking about people who are dead like they are still alive," and "acting weird." Has been associated with generalized weakness. Pt's family states pt has had similar symptoms when she had a UTI. Pt herself states she "feels fine" and "forgot to take" her BP meds today; family is unsure regarding this, however. Pt denies CP/palpitations, no SOB/cough, no abd pain, no N/V/D, no back pain, no focal motor weakness, no tingling/numbness in extremities, no slurred speech, no facial droop.     Past Medical History:  Diagnosis Date  . Anemia in chronic kidney disease 01/20/2016  . B12 deficiency anemia 10/25/2015  . Chronic renal disease, stage 3, moderately decreased glomerular filtration rate (GFR) between 30-59 mL/min/1.73 square meter 01/07/2016  . Depression   . Essential hypertension   . GI bleed    Severe gastritis and duodenal ulcers by EGD May 2016   . HLD (hyperlipidemia)   . PSVT (paroxysmal supraventricular tachycardia) ([place])   . Septic arthritis (HCC)    MRSA, left knee   . Type 2 diabetes mellitus [facility])     Patient Active Problem List   Diagnosis Date Noted  . Anemia in chronic kidney disease 01/20/2016  . Chronic renal disease, stage 3, moderately decreased glomerular filtration rate (GFR) between 30-59 mL/min/1.73 square meter 01/07/2016  . B12 deficiency anemia 10/25/2015  . Dysphagia 03/13/2015  . AP (abdominal pain)   . Hematemesis without nausea   . Atrial fibrillation and flutter ([place])   . AKI (acute kidney injury) ([place])   . Bacteremia    . Essential hypertension   . Septic joint of left knee joint ([place])   . Blood poisoning   . Fever 12/22/2014  . History of fractured kneecap 12/22/2014  . Knee fracture, left 12/22/2014  . Sepsis ([place]) 12/22/2014  . Tachycardia with 141 - 160 beats per minute 12/22/2014  . Diabetes mellitus without complication ([place])   . Hypertension   . HLD (hyperlipidemia)   . Depression     Past Surgical History:  Procedure Laterality Date  . CHOLECYSTECTOMY N/A 11/04/2012   Procedure: LAPAROSCOPIC CHOLECYSTECTOMY;  Surgeon: Jamesetta So, MD;  Location: [facility] ORS;  Service: General;  Laterality: N/A;  . ESOPHAGOGASTRODUODENOSCOPY N/A 01/06/2015   SLF: mild esophagitis  . I&D EXTREMITY Left 12/24/2014   Procedure: IRRIGATION AND DEBRIDEMENT EXTREMITY AND ARTHROSCOPY KNEE;  Surgeon: Renette Butters, MD;  Location: [facility];  Service: Orthopedics;  Laterality: Left;  . KNEE SURGERY    . SAVORY DILATION  01/06/2015   Procedure: SAVORY DILATION;  Surgeon: Danie Binder, MD;  Location: [facility];  Service: Endoscopy;;    OB History    Gravida Para Term Preterm AB Living   10 10 10     9    SAB TAB Ectopic Multiple Live Births                   Home Medications    Prior to Admission medications   Medication Sig Start Date End Date Taking? Authorizing  Provider  acetaminophen (TYLENOL) 325 MG tablet Take 650 mg by mouth every 6 (six) hours as needed for mild pain or moderate pain.     [provider]  CARTIA XT 120 MG 24 hr capsule  03/01/16   [provider]  cyanocobalamin (,VITAMIN B-12,) 1000 MCG/ML injection ADMINISTER 1 ML(1000 MCG) UNDER THE SKIN EVERY 30 DAYS 12/21/16   Holley Bouche, NP  losartan (COZAAR) 100 MG tablet Take 100 mg by mouth daily.  04/07/15   [provider]  pantoprazole (PROTONIX) 40 MG tablet Take 1 tablet (40 mg total) by mouth daily. 12/21/15   Carlis Stable, NP    Family History Family History  Problem Relation Age of Onset  . Family  history unknown: Yes    Social History Social History  Substance Use Topics  . Smoking status: Never Smoker  . Smokeless tobacco: Never Used     Comment: Never smoked  . Alcohol use No     Allergies   Oxycodone   Review of Systems Review of Systems ROS: Statement: All systems negative except as marked or noted in the HPI; Constitutional: Negative for fever and chills. +generalized weakness.; ; Eyes: Negative for eye pain, redness and discharge. ; ; ENMT: Negative for ear pain, hoarseness, nasal congestion, sinus pressure and sore throat. ; ; Cardiovascular: Negative for chest pain, palpitations, diaphoresis, dyspnea and peripheral edema. ; ; Respiratory: Negative for cough, wheezing and stridor. ; ; Gastrointestinal: Negative for nausea, vomiting, diarrhea, abdominal pain, blood in stool, hematemesis, jaundice and rectal bleeding. . ; ; Genitourinary: Negative for dysuria, flank pain and hematuria. ; ; Musculoskeletal: Negative for back pain and neck pain. Negative for swelling and trauma.; ; Skin: Negative for pruritus, rash, abrasions, blisters, bruising and skin lesion.; ; Neuro: +AMS. Negative for headache, lightheadedness and neck stiffness. Negative for altered level of consciousness, extremity weakness, paresthesias, involuntary movement, seizure and syncope.       Physical Exam Updated Vital Signs BP (!) 180/110 (BP Location: Left Arm)   Pulse (!) 102   Temp 98.4 F (36.9 C) (Oral)   Resp 20   Ht 5\' 5"  (1.651 m)   Wt 63 kg (139 lb)   SpO2 98%   BMI 23.13 kg/m     22:35 Orthostatic Vital Signs HV  Orthostatic Lying   BP- Lying:  185/100  Pulse- Lying: 100      Orthostatic Sitting  BP- Sitting:  192/95  Pulse- Sitting: 110      Orthostatic Standing at 0 minutes  BP- Standing at 0 minutes:  (Pt states she is unable to stand for OS V/S. Pt's family states that patient is wheelchair bound and unable to stand due to left leg weakness)     Physical  Exam 2130: Physical examination:  Nursing notes reviewed; Vital signs and O2 SAT reviewed;  Constitutional: Well developed, Well nourished, In no acute distress; Head:  Normocephalic, atraumatic; Eyes: EOMI, PERRL, No scleral icterus; ENMT: Mouth and pharynx normal, Mucous membranes dry; Neck: Supple, Full range of motion, No lymphadenopathy; Cardiovascular: Tachycardic rate and rhythm, No gallop; Respiratory: Breath sounds clear & equal bilaterally, No wheezes.  Speaking full sentences with ease, Normal respiratory effort/excursion; Chest: Nontender, Movement normal; Abdomen: Soft, Nontender, Nondistended, Normal bowel sounds; Genitourinary: No CVA tenderness; Extremities: Pulses normal, No tenderness, No edema, No calf edema or asymmetry.; Neuro: Awake, alert, confused regarding events. Major CN grossly intact. No facial droop. Speech clear. Grips equal. Strength 5/5 equal bilat UE's and  LE's. No gross focal motor or sensory deficits in extremities.; Skin: Color normal, Warm, Dry.   ED Treatments / Results  Labs (all labs ordered are listed, but only abnormal results are displayed)   EKG  EKG Interpretation  Date/Time:  Monday March 24 2017 21:29:38 EDT Ventricular Rate:  100 PR Interval:    QRS Duration: 89 QT Interval:  357 QTC Calculation: 461 R Axis:   -9 Text Interpretation:  Sinus tachycardia Borderline prolonged PR interval Low voltage, precordial leads When compared with ECG of 12/23/2014 No significant change was found Confirmed by [facility]  MD, Nunzio Cory [phone]) on 03/24/2017 9:35:38 PM       Radiology   Procedures Procedures (including critical care time)  Medications Ordered in ED Medications  diltiazem (CARDIZEM) tablet 30 mg (not administered)     Initial Impression / Assessment and Plan / ED Course  I have reviewed the triage vital signs and the nursing notes.  Pertinent labs & imaging results that were available during my care of the patient were reviewed by me  and considered in my medical decision making (see chart for details).  MDM Reviewed: previous chart, nursing note and vitals Reviewed previous: labs and ECG Interpretation: labs, ECG, x-ray and CT scan   Results for orders placed or performed during the hospital encounter of 03/24/17  Urinalysis, Routine w reflex microscopic  Result Value Ref Range   Color, Urine YELLOW YELLOW   APPearance CLOUDY (A) CLEAR   Specific Gravity, Urine 1.010 1.005 - 1.030   pH 7.0 5.0 - 8.0   Glucose, UA NEGATIVE NEGATIVE mg/dL   Hgb urine dipstick LARGE (A) NEGATIVE   Bilirubin Urine NEGATIVE NEGATIVE   Ketones, ur NEGATIVE NEGATIVE mg/dL   Protein, ur 100 (A) NEGATIVE mg/dL   Nitrite NEGATIVE NEGATIVE   Leukocytes, UA MODERATE (A) NEGATIVE   RBC / HPF TOO NUMEROUS TO COUNT 0 - 5 RBC/hpf   WBC, UA TOO NUMEROUS TO COUNT 0 - 5 WBC/hpf   Bacteria, UA MANY (A) NONE SEEN   Squamous Epithelial / LPF 6-30 (A) NONE SEEN   Mucous PRESENT    Non Squamous Epithelial 0-5 (A) NONE SEEN  Comprehensive metabolic panel  Result Value Ref Range   Sodium 141 135 - 145 mmol/L   Potassium 3.7 3.5 - 5.1 mmol/L   Chloride 108 101 - 111 mmol/L   CO2 25 22 - 32 mmol/L   Glucose, Bld 126 (H) 65 - 99 mg/dL   BUN 28 (H) 6 - 20 mg/dL   Creatinine, Ser 1.35 (H) 0.44 - 1.00 mg/dL   Calcium 9.3 8.9 - 10.3 mg/dL   Total Protein 7.6 6.5 - 8.1 g/dL   Albumin 3.7 3.5 - 5.0 g/dL   AST 16 15 - 41 U/L   ALT 8 (L) 14 - 54 U/L   Alkaline Phosphatase 65 38 - 126 U/L   Total Bilirubin 0.4 0.3 - 1.2 mg/dL   GFR calc non Af Amer 35 (L) >60 mL/min   GFR calc Af Amer 41 (L) >60 mL/min   Anion gap 8 5 - 15  Troponin I  Result Value Ref Range   Troponin I <0.03 <0.03 ng/mL  Lactic acid, plasma  Result Value Ref Range   Lactic Acid, Venous 0.9 0.5 - 1.9 mmol/L  CBC with Differential  Result Value Ref Range   WBC 6.5 4.0 - 10.5 K/uL   RBC 3.28 (L) 3.87 - 5.11 MIL/uL   Hemoglobin 11.0 (L) 12.0 - 15.0 g/dL  HCT 31.0 (L) 36.0 -  46.0 %   MCV 94.5 78.0 - 100.0 fL   MCH 33.5 26.0 - 34.0 pg   MCHC 35.5 30.0 - 36.0 g/dL   RDW 14.3 11.5 - 15.5 %   Platelets 195 150 - 400 K/uL   Neutrophils Relative % 47 %   Neutro Abs 3.1 1.7 - 7.7 K/uL   Lymphocytes Relative 47 %   Lymphs Abs 3.0 0.7 - 4.0 K/uL   Monocytes Relative 5 %   Monocytes Absolute 0.3 0.1 - 1.0 K/uL   Eosinophils Relative 1 %   Eosinophils Absolute 0.1 0.0 - 0.7 K/uL   Basophils Relative 0 %   Basophils Absolute 0.0 0.0 - 0.1 K/uL   Dg Chest 2 View Result Date: 03/24/2017 CLINICAL DATA:  Altered mental status EXAM: CHEST  2 VIEW COMPARISON:  12/22/2014 FINDINGS: Cardiac shadow is mildly enlarged. The lungs are well aerated bilaterally. No focal infiltrate or sizable effusion is seen. Old right humeral fracture is seen with healing. Diffuse degenerative changes of thoracic spine are noted. IMPRESSION: Chronic changes without acute abnormality. Electronically Signed   By: Inez Catalina M.D.   On: 03/24/2017 22:32   Ct Head Wo Contrast  Result Date: 03/24/2017 CLINICAL DATA:  Slurred speech EXAM: CT HEAD WITHOUT CONTRAST TECHNIQUE: Contiguous axial images were obtained from the base of the skull through the vertex without intravenous contrast. COMPARISON:  None. FINDINGS: Brain: Diffuse atrophic changes are noted. Chronic white matter ischemic change is seen. No findings to suggest acute hemorrhage, acute infarction or space-occupying mass lesion are noted. Basal ganglia calcifications are seen. Vascular: No hyperdense vessel or unexpected calcification. Skull: Normal. Negative for fracture or focal lesion. Sinuses/Orbits: No acute finding. Other: None. IMPRESSION: Atrophic and chronic white matter ischemic changes without acute abnormality. Electronically Signed   By: Inez Catalina M.D.   On: 03/24/2017 22:09    2340:  Unable to stand for orthostatic VS due to weakness (pt wheelchair bound at baseline). Pt states she did not take her BP meds today, but family  believes she did.  Given pt's hypertension and tachycardia, question if pt did take her meds, so pt was given a small dose of short acting cardizem. +UTI, UC pending; will dose IV rocephin.  T/C to [facility]. [place] Comment, case discussed, including:  HPI, pertinent PM/SHx, VS/PE, dx testing, ED course and treatment:  Agreeable to admit.      Final Clinical Impressions(s) / ED Diagnoses   Final diagnoses:  None    New Prescriptions New Prescriptions   No medications on file     Francine Graven, DO 03/28/17 0740

## 2017-03-25 ENCOUNTER — Encounter (HOSPITAL_COMMUNITY): Payer: Self-pay

## 2017-03-25 DIAGNOSIS — N183 Chronic kidney disease, stage 3 (moderate): Secondary | ICD-10-CM | POA: Diagnosis not present

## 2017-03-25 DIAGNOSIS — E1122 Type 2 diabetes mellitus with diabetic chronic kidney disease: Secondary | ICD-10-CM | POA: Diagnosis not present

## 2017-03-25 DIAGNOSIS — I10 Essential (primary) hypertension: Secondary | ICD-10-CM

## 2017-03-25 LAB — BASIC METABOLIC PANEL
Anion gap: 7 (ref 5–15)
BUN: 24 mg/dL — ABNORMAL HIGH (ref 6–20)
CO2: 28 mmol/L (ref 22–32)
Calcium: 9.2 mg/dL (ref 8.9–10.3)
Chloride: 105 mmol/L (ref 101–111)
Creatinine, Ser: 1.15 mg/dL — ABNORMAL HIGH (ref 0.44–1.00)
GFR calc Af Amer: 49 mL/min — ABNORMAL LOW (ref >60)
GFR calc non Af Amer: 42 mL/min — ABNORMAL LOW (ref >60)
Glucose, Bld: 96 mg/dL (ref 65–99)
Potassium: 3.8 mmol/L (ref 3.5–5.1)
Sodium: 140 mmol/L (ref 135–145)

## 2017-03-25 LAB — CBC
HCT: 31.9 % — ABNORMAL LOW (ref 36.0–46.0)
Hemoglobin: 10.9 g/dL — ABNORMAL LOW (ref 12.0–15.0)
MCH: 31.5 pg (ref 26.0–34.0)
MCHC: 34.2 g/dL (ref 30.0–36.0)
MCV: 92.2 fL (ref 78.0–100.0)
Platelets: 188 10*3/uL (ref 150–400)
RBC: 3.46 MIL/uL — ABNORMAL LOW (ref 3.87–5.11)
RDW: 14 % (ref 11.5–15.5)
WBC: 6.2 10*3/uL (ref 4.0–10.5)

## 2017-03-25 LAB — MRSA PCR SCREENING: MRSA by PCR: NEGATIVE

## 2017-03-25 LAB — GLUCOSE, CAPILLARY
Glucose-Capillary: 86 mg/dL (ref 65–99)
Glucose-Capillary: 102 mg/dL — ABNORMAL HIGH (ref 65–99)
Glucose-Capillary: 110 mg/dL — ABNORMAL HIGH (ref 65–99)
Glucose-Capillary: 110 mg/dL — ABNORMAL HIGH (ref 65–99)

## 2017-03-25 LAB — TSH: TSH: 3.883 u[IU]/mL (ref 0.350–4.500)

## 2017-03-25 MED ORDER — ONDANSETRON HCL 4 MG PO TABS: 4.0000 mg | ORAL_TABLET | Freq: Four times a day (QID) | ORAL | Status: DC | PRN

## 2017-03-25 MED ORDER — HYDRALAZINE HCL 20 MG/ML IJ SOLN: 5.0000 mg | INTRAMUSCULAR | Status: DC | PRN

## 2017-03-25 MED ORDER — ONDANSETRON HCL 4 MG/2ML IJ SOLN: 4.0000 mg | Freq: Four times a day (QID) | INTRAMUSCULAR | Status: DC | PRN

## 2017-03-25 MED ORDER — LOSARTAN POTASSIUM 50 MG PO TABS
100.0000 mg | ORAL_TABLET | Freq: Every day | ORAL | Status: DC
  Administered 2017-03-25 – 2017-03-26 (×2): 100 mg via ORAL
  Filled 2017-03-25 (×2): qty 2

## 2017-03-25 MED ORDER — INSULIN ASPART 100 UNIT/ML ~~LOC~~ SOLN: 0.0000 [IU] | Freq: Three times a day (TID) | SUBCUTANEOUS | Status: DC

## 2017-03-25 MED ORDER — CEFTRIAXONE SODIUM 1 G IJ SOLR
1.0000 g | INTRAMUSCULAR | Status: DC
  Filled 2017-03-25: qty 10

## 2017-03-25 MED ORDER — PANTOPRAZOLE SODIUM 40 MG PO TBEC
40.0000 mg | DELAYED_RELEASE_TABLET | Freq: Every day | ORAL | Status: DC
  Administered 2017-03-25 – 2017-03-26 (×2): 40 mg via ORAL
  Filled 2017-03-25 (×2): qty 1

## 2017-03-25 MED ORDER — DILTIAZEM HCL ER COATED BEADS 180 MG PO CP24
180.0000 mg | ORAL_CAPSULE | Freq: Every day | ORAL | Status: DC
  Administered 2017-03-25: 180 mg via ORAL
  Filled 2017-03-25: qty 1

## 2017-03-25 MED ORDER — INSULIN ASPART 100 UNIT/ML ~~LOC~~ SOLN: 0.0000 [IU] | Freq: Every day | SUBCUTANEOUS | Status: DC

## 2017-03-25 MED ORDER — ENOXAPARIN SODIUM 40 MG/0.4ML ~~LOC~~ SOLN
40.0000 mg | SUBCUTANEOUS | Status: DC
  Administered 2017-03-25 – 2017-03-26 (×2): 40 mg via SUBCUTANEOUS
  Filled 2017-03-25 (×2): qty 0.4

## 2017-03-25 MED ORDER — CEFTRIAXONE SODIUM 1 G IJ SOLR
1.0000 g | INTRAMUSCULAR | Status: DC
  Administered 2017-03-25: 1 g via INTRAVENOUS
  Filled 2017-03-25 (×3): qty 10

## 2017-03-25 MED ORDER — ACETAMINOPHEN 325 MG PO TABS: 650.0000 mg | ORAL_TABLET | Freq: Four times a day (QID) | ORAL | Status: DC | PRN

## 2017-03-25 NOTE — Progress Notes (Signed)
Subjective: She has no complaints this morning. She was admitted with confusion and UTI last night. She denies dysuria or fever. Head CT was negative.  Objective: Vital signs in last 24 hours: Vitals:   03/25/17 0100 03/25/17 0200 03/25/17 0241 03/25/17 0500  BP: (!) 165/113 (!) 160/94 (!) 181/109 (!) 176/88  Pulse: (!) 101 90 (!) 104 88  Resp: 18 19 18 16   Temp:   98.4 F (36.9 C) 97.6 F (36.4 C)  TempSrc:   Oral Oral  SpO2: 99% 98% 98% 99%  Weight:   138 lb 14.2 oz (63 kg)   Height:   5\' 5"  (1.651 m)    Weight change:   Intake/Output Summary (Last 24 hours) at 03/25/17 0739 Last data filed at 03/25/17 0500  Gross per 24 hour  Intake               50 ml  Output              500 ml  Net             -450 ml    Physical Exam: Alert. No distress. Pharynx moist. Lungs clear. Heart regular with no murmurs. Abdomen nontender with no hepatosplenomegaly. No CVA tenderness. Extremities reveal no edema.  Lab Results:    Results for orders placed or performed during the hospital encounter of 03/24/17 (from the past 24 hour(s))  Urinalysis, Routine w reflex microscopic     Status: Abnormal   Collection Time: 03/24/17  9:35 PM  Result Value Ref Range   Color, Urine YELLOW YELLOW   APPearance CLOUDY (A) CLEAR   Specific Gravity, Urine 1.010 1.005 - 1.030   pH 7.0 5.0 - 8.0   Glucose, UA NEGATIVE NEGATIVE mg/dL   Hgb urine dipstick LARGE (A) NEGATIVE   Bilirubin Urine NEGATIVE NEGATIVE   Ketones, ur NEGATIVE NEGATIVE mg/dL   Protein, ur 100 (A) NEGATIVE mg/dL   Nitrite NEGATIVE NEGATIVE   Leukocytes, UA MODERATE (A) NEGATIVE   RBC / HPF TOO NUMEROUS TO COUNT 0 - 5 RBC/hpf   WBC, UA TOO NUMEROUS TO COUNT 0 - 5 WBC/hpf   Bacteria, UA MANY (A) NONE SEEN   Squamous Epithelial / LPF 6-30 (A) NONE SEEN   Mucous PRESENT    Non Squamous Epithelial 0-5 (A) NONE SEEN  Comprehensive metabolic panel     Status: Abnormal   Collection Time: 03/24/17  9:42 PM  Result Value Ref Range    Sodium 141 135 - 145 mmol/L   Potassium 3.7 3.5 - 5.1 mmol/L   Chloride 108 101 - 111 mmol/L   CO2 25 22 - 32 mmol/L   Glucose, Bld 126 (H) 65 - 99 mg/dL   BUN 28 (H) 6 - 20 mg/dL   Creatinine, Ser 1.35 (H) 0.44 - 1.00 mg/dL   Calcium 9.3 8.9 - 10.3 mg/dL   Total Protein 7.6 6.5 - 8.1 g/dL   Albumin 3.7 3.5 - 5.0 g/dL   AST 16 15 - 41 U/L   ALT 8 (L) 14 - 54 U/L   Alkaline Phosphatase 65 38 - 126 U/L   Total Bilirubin 0.4 0.3 - 1.2 mg/dL   GFR calc non Af Amer 35 (L) >60 mL/min   GFR calc Af Amer 41 (L) >60 mL/min   Anion gap 8 5 - 15  Troponin I     Status: None   Collection Time: 03/24/17  9:42 PM  Result Value Ref Range   Troponin I <0.03 <0.03  ng/mL  Lactic acid, plasma     Status: None   Collection Time: 03/24/17  9:42 PM  Result Value Ref Range   Lactic Acid, Venous 0.9 0.5 - 1.9 mmol/L  CBC with Differential     Status: Abnormal   Collection Time: 03/24/17  9:42 PM  Result Value Ref Range   WBC 6.5 4.0 - 10.5 K/uL   RBC 3.28 (L) 3.87 - 5.11 MIL/uL   Hemoglobin 11.0 (L) 12.0 - 15.0 g/dL   HCT 31.0 (L) 36.0 - 46.0 %   MCV 94.5 78.0 - 100.0 fL   MCH 33.5 26.0 - 34.0 pg   MCHC 35.5 30.0 - 36.0 g/dL   RDW 14.3 11.5 - 15.5 %   Platelets 195 150 - 400 K/uL   Neutrophils Relative % 47 %   Neutro Abs 3.1 1.7 - 7.7 K/uL   Lymphocytes Relative 47 %   Lymphs Abs 3.0 0.7 - 4.0 K/uL   Monocytes Relative 5 %   Monocytes Absolute 0.3 0.1 - 1.0 K/uL   Eosinophils Relative 1 %   Eosinophils Absolute 0.1 0.0 - 0.7 K/uL   Basophils Relative 0 %   Basophils Absolute 0.0 0.0 - 0.1 K/uL  TSH     Status: None   Collection Time: 03/24/17  9:42 PM  Result Value Ref Range   TSH 3.883 0.350 - 4.500 uIU/mL  MRSA PCR Screening     Status: None   Collection Time: 03/25/17  3:02 AM  Result Value Ref Range   MRSA by PCR NEGATIVE NEGATIVE  CBC     Status: Abnormal   Collection Time: 03/25/17  6:32 AM  Result Value Ref Range   WBC 6.2 4.0 - 10.5 K/uL   RBC 3.46 (L) 3.87 - 5.11 MIL/uL    Hemoglobin 10.9 (L) 12.0 - 15.0 g/dL   HCT 31.9 (L) 36.0 - 46.0 %   MCV 92.2 78.0 - 100.0 fL   MCH 31.5 26.0 - 34.0 pg   MCHC 34.2 30.0 - 36.0 g/dL   RDW 14.0 11.5 - 15.5 %   Platelets 188 150 - 400 K/uL     ABGS No results for input(s): PHART, PO2ART, TCO2, HCO3 in the last 72 hours.  Invalid input(s): PCO2 CULTURES Recent Results (from the past 240 hour(s))  MRSA PCR Screening     Status: None   Collection Time: 03/25/17  3:02 AM  Result Value Ref Range Status   MRSA by PCR NEGATIVE NEGATIVE Final    Comment:        The GeneXpert MRSA Assay (FDA approved for NASAL specimens only), is one component of a comprehensive MRSA colonization surveillance program. It is not intended to diagnose MRSA infection nor to guide or monitor treatment for MRSA infections.    Studies/Results: Dg Chest 2 View  Result Date: 03/24/2017 CLINICAL DATA:  Altered mental status EXAM: CHEST  2 VIEW COMPARISON:  12/22/2014 FINDINGS: Cardiac shadow is mildly enlarged. The lungs are well aerated bilaterally. No focal infiltrate or sizable effusion is seen. Old right humeral fracture is seen with healing. Diffuse degenerative changes of thoracic spine are noted. IMPRESSION: Chronic changes without acute abnormality. Electronically Signed   By: Inez Catalina M.D.   On: 03/24/2017 22:32   Ct Head Wo Contrast  Result Date: 03/24/2017 CLINICAL DATA:  Slurred speech EXAM: CT HEAD WITHOUT CONTRAST TECHNIQUE: Contiguous axial images were obtained from the base of the skull through the vertex without intravenous contrast. COMPARISON:  None. FINDINGS: Brain: Diffuse  atrophic changes are noted. Chronic white matter ischemic change is seen. No findings to suggest acute hemorrhage, acute infarction or space-occupying mass lesion are noted. Basal ganglia calcifications are seen. Vascular: No hyperdense vessel or unexpected calcification. Skull: Normal. Negative for fracture or focal lesion. Sinuses/Orbits: No acute  finding. Other: None. IMPRESSION: Atrophic and chronic white matter ischemic changes without acute abnormality. Electronically Signed   By: Inez Catalina M.D.   On: 03/24/2017 22:09   Micro Results: Recent Results (from the past 240 hour(s))  MRSA PCR Screening     Status: None   Collection Time: 03/25/17  3:02 AM  Result Value Ref Range Status   MRSA by PCR NEGATIVE NEGATIVE Final    Comment:        The GeneXpert MRSA Assay (FDA approved for NASAL specimens only), is one component of a comprehensive MRSA colonization surveillance program. It is not intended to diagnose MRSA infection nor to guide or monitor treatment for MRSA infections.    Studies/Results: Dg Chest 2 View  Result Date: 03/24/2017 CLINICAL DATA:  Altered mental status EXAM: CHEST  2 VIEW COMPARISON:  12/22/2014 FINDINGS: Cardiac shadow is mildly enlarged. The lungs are well aerated bilaterally. No focal infiltrate or sizable effusion is seen. Old right humeral fracture is seen with healing. Diffuse degenerative changes of thoracic spine are noted. IMPRESSION: Chronic changes without acute abnormality. Electronically Signed   By: Inez Catalina M.D.   On: 03/24/2017 22:32   Ct Head Wo Contrast  Result Date: 03/24/2017 CLINICAL DATA:  Slurred speech EXAM: CT HEAD WITHOUT CONTRAST TECHNIQUE: Contiguous axial images were obtained from the base of the skull through the vertex without intravenous contrast. COMPARISON:  None. FINDINGS: Brain: Diffuse atrophic changes are noted. Chronic white matter ischemic change is seen. No findings to suggest acute hemorrhage, acute infarction or space-occupying mass lesion are noted. Basal ganglia calcifications are seen. Vascular: No hyperdense vessel or unexpected calcification. Skull: Normal. Negative for fracture or focal lesion. Sinuses/Orbits: No acute finding. Other: None. IMPRESSION: Atrophic and chronic white matter ischemic changes without acute abnormality. Electronically Signed    By: Inez Catalina M.D.   On: 03/24/2017 22:09   Medications:  I have reviewed the patient's current medications Scheduled Meds: . diltiazem  180 mg Oral Daily  . enoxaparin (LOVENOX) injection  40 mg Subcutaneous Q24H  . insulin aspart  0-5 Units Subcutaneous QHS  . insulin aspart  0-9 Units Subcutaneous TID WC  . losartan  100 mg Oral Daily  . pantoprazole  40 mg Oral Daily   Continuous Infusions: . cefTRIAXone (ROCEPHIN)  IV     PRN Meds:.acetaminophen, hydrALAZINE, ondansetron **OR** ondansetron (ZOFRAN) IV   Assessment/Plan: #1. UTI. Continue Rocephin. Culture pending. No leukocytosis. No fever. #2. Altered mental status. Improved. #3. Chronic kidney disease stage III. Stable. #4. Hypertension. Continue diltiazem ER. Losartan added. #5. Anemia of chronic disease. Followed in hematology. No need for Procrit now. Principal Problem:   Altered mental status Active Problems:   Diabetes mellitus without complication (HCC)   Hypertension   HLD (hyperlipidemia)   Essential hypertension   Chronic renal disease, stage 3, moderately decreased glomerular filtration rate (GFR) between 30-59 mL/min/1.73 square meter   Anemia in chronic kidney disease     LOS: 0 days   Ariadne Rissmiller 03/25/2017, 7:39 AM

## 2017-03-25 NOTE — Care Management Obs Status (Signed)
[facility] NOTIFICATION   Patient Details  Name: Laura Davenport MRN: [id number] Date of Birth: 06-Jun-1931   Medicare Observation Status Notification Given:  Yes    Sherald Barge, RN 03/25/2017, 1:12 PM

## 2017-03-25 NOTE — H&P (Signed)
History and Physical    Laura Davenport EZM:[id number] DOB: 1930-09-10 DOA: 03/24/2017  PCP: Laura Noble, MD  Patient coming from: Home.    Chief Complaint: brought in by family for slurred speech and confusion.   HPI: Laura Davenport is an 83 y.o. female with hx of CKD, anemia of chronic disease, HTN, depression, B12 deficiency, DM, CKD3, HLD, lives at home with her daughter, brought to the ER as she was delirius, confused, and having slurred speech.  She has no HA, SOB, fever, chills, abdominal pain, nausea or vomiting.  No new meds.  Evaluation in the ER included a normal WBC, Cr of 1.3, normal LFTs, and negative head CT and negative CXR for any acute process.  UA showed UTI with TNTC WBCs.  She was given IV Rocephin, and her BP was found to be elevated, and she was given Diltiazem 30mg  orally.  Hospitalist was asked to admit her for UTI, delirium, and severe HTN.  She has no facial droop or any focal weakness at this time.   ED Course:  See above.  Rewiew of Systems:  Constitutional: Negative for malaise, fever and chills. No significant weight loss or weight gain Eyes: Negative for eye pain, redness and discharge, diplopia, visual changes, or flashes of light. ENMT: Negative for ear pain, hoarseness, nasal congestion, sinus pressure and sore throat. No headaches; tinnitus, drooling, or problem swallowing. Cardiovascular: Negative for chest pain, palpitations, diaphoresis, dyspnea and peripheral edema. ; No orthopnea, PND Respiratory: Negative for cough, hemoptysis, wheezing and stridor. No pleuritic chestpain. Gastrointestinal: Negative for diarrhea, constipation,  melena, blood in stool, hematemesis, jaundice and rectal bleeding.    Genitourinary: Negative for frequency, dysuria, incontinence,flank pain and hematuria; Musculoskeletal: Negative for back pain and neck pain. Negative for swelling and trauma.;  Skin: . Negative for pruritus, rash, abrasions, bruising and skin lesion.;  ulcerations Neuro: Negative for headache, lightheadedness and neck stiffness. Negative for extremity weakness, burning feet, involuntary movement, seizure and syncope.  Psych: negative for anxiety, depression, insomnia, tearfulness, panic attacks,  paranoia, suicidal or homicidal ideation   Past Medical History:  Diagnosis Date  . Anemia in chronic kidney disease 01/20/2016  . B12 deficiency anemia 10/25/2015  . Chronic renal disease, stage 3, moderately decreased glomerular filtration rate (GFR) between 30-59 mL/min/1.73 square meter 01/07/2016  . Depression   . Essential hypertension   . GI bleed    Severe gastritis and duodenal ulcers by EGD May 2016   . HLD (hyperlipidemia)   . PSVT (paroxysmal supraventricular tachycardia) ([place])   . Septic arthritis (HCC)    MRSA, left knee   . Type 2 diabetes mellitus ([place])     Past Surgical History:  Procedure Laterality Date  . CHOLECYSTECTOMY N/A 11/04/2012   Procedure: LAPAROSCOPIC CHOLECYSTECTOMY;  Surgeon: Jamesetta So, MD;  Location: [facility];  Service: General;  Laterality: N/A;  . ESOPHAGOGASTRODUODENOSCOPY N/A 01/06/2015   SLF: mild esophagitis  . I&D EXTREMITY Left 12/24/2014   Procedure: IRRIGATION AND DEBRIDEMENT EXTREMITY AND ARTHROSCOPY KNEE;  Surgeon: Renette Butters, MD;  Location: [facility];  Service: Orthopedics;  Laterality: Left;  . KNEE SURGERY    . SAVORY DILATION  01/06/2015   Procedure: SAVORY DILATION;  Surgeon: Danie Binder, MD;  Location: [facility];  Service: Endoscopy;;     reports that she has never smoked. She has never used smokeless tobacco. She reports that she does not drink alcohol or use drugs.  Allergies  Allergen Reactions  . Oxycodone Hives  Family History  Problem Relation Age of Onset  . Family history unknown: Yes     Prior to Admission medications   Medication Sig Start Date End Date Taking? Authorizing Provider  acetaminophen (TYLENOL) 325 MG tablet Take 650 mg by mouth every 6 (six) hours  as needed for mild pain or moderate pain.     [provider]  CARTIA XT 120 MG 24 hr capsule  03/01/16   [provider]  cyanocobalamin (,VITAMIN B-12,) 1000 MCG/ML injection ADMINISTER 1 ML(1000 MCG) UNDER THE SKIN EVERY 30 DAYS 12/21/16   Holley Bouche, NP  losartan (COZAAR) 100 MG tablet Take 100 mg by mouth daily.  04/07/15   [provider]  pantoprazole (PROTONIX) 40 MG tablet Take 1 tablet (40 mg total) by mouth daily. 12/21/15   Carlis Stable, NP    Physical Exam: Vitals:   03/24/17 2119 03/24/17 2130 03/24/17 2230 03/24/17 2300  BP: (!) 180/110 (!) 186/92 (!) 203/118 (!) 191/101  Pulse: (!) 102 100 100 98  Resp: 20 18 13  (!) 22  Temp: 98.4 F (36.9 C)     TempSrc: Oral     SpO2: 98% 99% 100% 100%  Weight:      Height:       Constitutional: NAD, calm, comfortable Vitals:   03/24/17 2119 03/24/17 2130 03/24/17 2230 03/24/17 2300  BP: (!) 180/110 (!) 186/92 (!) 203/118 (!) 191/101  Pulse: (!) 102 100 100 98  Resp: 20 18 13  (!) 22  Temp: 98.4 F (36.9 C)     TempSrc: Oral     SpO2: 98% 99% 100% 100%  Weight:      Height:       Eyes: PERRL, lids and conjunctivae normal ENMT: Mucous membranes are moist. Posterior pharynx clear of any exudate or lesions.Normal dentition.  Neck: normal, supple, no masses, no thyromegaly Respiratory: clear to auscultation bilaterally, no wheezing, no crackles. Normal respiratory effort. No accessory muscle use.  Cardiovascular: Regular rate and rhythm, no murmurs / rubs / gallops. No extremity edema. 2+ pedal pulses. No carotid bruits.  Abdomen: no tenderness, no masses palpated. No hepatosplenomegaly. Bowel sounds positive.  Musculoskeletal: no clubbing / cyanosis. No joint deformity upper and lower extremities. Good ROM, no contractures. Normal muscle tone.  Skin: no rashes, lesions, ulcers. No induration Neurologic: CN 2-12 grossly intact. Sensation intact, DTR normal. Strength 5/5 in all 4. She is alert, but  didn't know the year.  She recognized her children.   Psychiatric: Normal judgment and insight.  Labs on Admission: I have personally reviewed following labs and imaging studies CBC:  Recent Labs Lab 03/24/17 2142  WBC 6.5  NEUTROABS 3.1  HGB 11.0*  HCT 31.0*  MCV 94.5  PLT 671   Basic Metabolic Panel:  Recent Labs Lab 03/24/17 2142  NA 141  K 3.7  CL 108  CO2 25  GLUCOSE 126*  BUN 28*  CREATININE 1.35*  CALCIUM 9.3   GFR: Estimated Creatinine Clearance: 27.9 mL/min (A) (by C-G formula based on SCr of 1.35 mg/dL (H)). Liver Function Tests:  Recent Labs Lab 03/24/17 2142  AST 16  ALT 8*  ALKPHOS 65  BILITOT 0.4  PROT 7.6  ALBUMIN 3.7   Cardiac Enzymes:  Recent Labs Lab 03/24/17 2142  TROPONINI <0.03   Urine analysis:    Component Value Date/Time   COLORURINE YELLOW 03/24/2017 2135   APPEARANCEUR CLOUDY (A) 03/24/2017 2135   LABSPEC 1.010 03/24/2017 2135   PHURINE 7.0 03/24/2017 2135  GLUCOSEU NEGATIVE 03/24/2017 2135   HGBUR LARGE (A) 03/24/2017 2135   BILIRUBINUR NEGATIVE 03/24/2017 2135   [place] 03/24/2017 2135   PROTEINUR 100 (A) 03/24/2017 2135   UROBILINOGEN 0.2 01/17/2015 1000   NITRITE NEGATIVE 03/24/2017 2135   LEUKOCYTESUR MODERATE (A) 03/24/2017 2135   Radiological Exams on Admission: Dg Chest 2 View  Result Date: 03/24/2017 CLINICAL DATA:  Altered mental status EXAM: CHEST  2 VIEW COMPARISON:  12/22/2014 FINDINGS: Cardiac shadow is mildly enlarged. The lungs are well aerated bilaterally. No focal infiltrate or sizable effusion is seen. Old right humeral fracture is seen with healing. Diffuse degenerative changes of thoracic spine are noted. IMPRESSION: Chronic changes without acute abnormality. Electronically Signed   By: Inez Catalina M.D.   On: 03/24/2017 22:32   Ct Head Wo Contrast  Result Date: 03/24/2017 CLINICAL DATA:  Slurred speech EXAM: CT HEAD WITHOUT CONTRAST TECHNIQUE: Contiguous axial images were obtained  from the base of the skull through the vertex without intravenous contrast. COMPARISON:  None. FINDINGS: Brain: Diffuse atrophic changes are noted. Chronic white matter ischemic change is seen. No findings to suggest acute hemorrhage, acute infarction or space-occupying mass lesion are noted. Basal ganglia calcifications are seen. Vascular: No hyperdense vessel or unexpected calcification. Skull: Normal. Negative for fracture or focal lesion. Sinuses/Orbits: No acute finding. Other: None. IMPRESSION: Atrophic and chronic white matter ischemic changes without acute abnormality. Electronically Signed   By: Inez Catalina M.D.   On: 03/24/2017 22:09    EKG: Independently reviewed.  Assessment/Plan Principal Problem:   Altered mental status Active Problems:   Diabetes mellitus without complication (HCC)   Hypertension   HLD (hyperlipidemia)   Essential hypertension   Chronic renal disease, stage 3, moderately decreased glomerular filtration rate (GFR) between 30-59 mL/min/1.73 square meter   Anemia in chronic kidney disease   PLAN:   Altered mental status:  Suspect it was caused by her UTI, but could be hypertensive encephalopathy (less likely).  Will admit her OBS, Tx HTN with IV Hydralazine low dose PRN.  Continue with her Cardiazem, with slightly increase in dose.  Tx UTI with IV rocephin.   UTI:  WIll Tx with IV Rocephin.  HTN:  Will increase her cardiazem dose slightly.  Use IV Hydralazine as requried.   CKD:  Stable.   DM: Will use sensitive SSI.  Carb modified diet.   DVT prophylaxis: heparin.  Code Status: full code.  Family Communication: 2 daughters at bedside.  Disposition Plan: home.  Consults called: None. Admission status: OBS>    Demoni Gergen MD FACP. [facility]  If 7PM-7AM, please contact night-coverage [email or web address] Password [email or web address]  03/25/2017, 12:00 AM

## 2017-03-25 NOTE — Care Management Note (Signed)
Case Management Note  Patient Details  Name: ZHURI KRASS MRN: [id number] Date of Birth: [birth year]  Subjective/Objective:                  Pt admitted with AMS. Family at the bedside. Pt lives at home, her daughter lives with her and someone stays with her almost 24/7. She has PCP, transportation and insurance with drug coverage. She is WC bound and has no DME needs. She has no HH services pta. She communicates no needs.   Action/Plan: Plan for DC home with self care.   Expected Discharge Date:    03/26/2017              Expected Discharge Plan:  Home/Self Care  In-House Referral:  NA  Discharge planning Services  CM Consult  Post Acute Care Choice:  NA Choice offered to:  NA  Status of Service:  Completed, signed off  Sherald Barge, RN 03/25/2017, 1:15 PM

## 2017-03-26 ENCOUNTER — Encounter (HOSPITAL_COMMUNITY): Payer: Self-pay

## 2017-03-26 ENCOUNTER — Other Ambulatory Visit (HOSPITAL_COMMUNITY): Payer: Self-pay

## 2017-03-26 ENCOUNTER — Ambulatory Visit (HOSPITAL_COMMUNITY): Payer: Self-pay

## 2017-03-26 ENCOUNTER — Encounter (HOSPITAL_COMMUNITY): Payer: Medicare Other | Attending: Oncology

## 2017-03-26 VITALS — BP 131/71 | HR 71 | Temp 98.0°F | Resp 18

## 2017-03-26 DIAGNOSIS — N183 Chronic kidney disease, stage 3 (moderate): Secondary | ICD-10-CM | POA: Insufficient documentation

## 2017-03-26 DIAGNOSIS — D631 Anemia in chronic kidney disease: Secondary | ICD-10-CM | POA: Diagnosis not present

## 2017-03-26 DIAGNOSIS — E1122 Type 2 diabetes mellitus with diabetic chronic kidney disease: Secondary | ICD-10-CM | POA: Diagnosis not present

## 2017-03-26 LAB — GLUCOSE, CAPILLARY
Glucose-Capillary: 91 mg/dL (ref 65–99)
Glucose-Capillary: 91 mg/dL (ref 65–99)

## 2017-03-26 MED ORDER — DILTIAZEM HCL ER COATED BEADS 240 MG PO CP24
240.0000 mg | ORAL_CAPSULE | Freq: Every day | ORAL | Status: DC
  Administered 2017-03-26: 240 mg via ORAL
  Filled 2017-03-26: qty 1

## 2017-03-26 MED ORDER — CEPHALEXIN 250 MG PO CAPS: 250.0000 mg | ORAL_CAPSULE | Freq: Three times a day (TID) | ORAL | 0 refills | Status: AC

## 2017-03-26 MED ORDER — DARBEPOETIN ALFA 60 MCG/0.3ML IJ SOSY
50.0000 ug | PREFILLED_SYRINGE | Freq: Once | INTRAMUSCULAR | Status: AC
  Administered 2017-03-26: 50 ug via SUBCUTANEOUS

## 2017-03-26 MED ORDER — DILTIAZEM HCL ER COATED BEADS 240 MG PO CP24: 240.0000 mg | ORAL_CAPSULE | Freq: Every day | ORAL | 12 refills | Status: DC

## 2017-03-26 NOTE — Progress Notes (Signed)
Margaretha Seeds presents today for injection per MD orders. Aranesp 50 mcg administered SQ in right lower abdomen. Administration without incident. Patient tolerated well. Patient discharged in stable condition via wheelchair from clinic with daughter.  Follow up as scheduled.

## 2017-03-26 NOTE — Discharge Summary (Signed)
Physician Discharge Summary  MARCIA HARTWELL OYD:[id number] DOB: Jul 06, 1931 DOA: 03/24/2017   Admit date: 03/24/2017 Discharge date: 03/26/2017  Discharge Diagnoses:  Principal Problem:   Altered mental status Active Problems:   Diabetes mellitus without complication ([place])   Hypertension   HLD (hyperlipidemia)   Essential hypertension   Chronic renal disease, stage 3, moderately decreased glomerular filtration rate (GFR) between 30-59 mL/min/1.73 square meter   Anemia in chronic kidney disease    Wt Readings from Last 3 Encounters:  03/25/17 138 lb 14.2 oz (63 kg)  12/11/16 139 lb 6.4 oz (63.2 kg)  12/20/15 137 lb 6.4 oz (62.3 kg)     Hospital Course:  This patient is an 83 year old female who presented with altered mental status. CT of the brain revealed no acute abnormality. Urinalysis revealed too numerous to count white cells. Urine culture was obtained but remains pending. She has had no fever or leukocytosis. An external catheter was used during the hospitalization. She denies urinary tract symptoms but has some underlying dementia and place. Her confusion resolved. Neurologic status is back to baseline after treatment with ceftriaxone. She is euthyroid with a normal TSH.  She is improved and stable for discharge on the morning of July 25. She will be seen in follow-up in my office in one week. Antibiotic therapy is being modified to cephalexin orally.  She has stable chronic kidney disease with a creatinine in the 1.1-1.3 range. She has a stable anemia of chronic disease.  Condition at discharge is much improved. Mental status is back to baseline. Lungs clear. Heart regular with no murmurs. Abdomen soft and nontender with no hepatosplenomegaly. No CVA tenderness.   Discharge Instructions  Discharge Instructions    Diet - low sodium heart healthy    Complete by:  As directed    Increase activity slowly    Complete by:  As directed      Allergies as of 03/26/2017    Reactions   Oxycodone Hives      Medication List    TAKE these medications   acetaminophen 325 MG tablet Commonly known as:  TYLENOL Take 650 mg by mouth every 6 (six) hours as needed for mild pain or moderate pain.   cephALEXin 250 MG capsule Commonly known as:  KEFLEX Take 1 capsule (250 mg total) by mouth 3 (three) times daily.   cyanocobalamin 1000 MCG/ML injection Commonly known as:  (VITAMIN B-12) ADMINISTER 1 ML(1000 MCG) UNDER THE SKIN EVERY 30 DAYS   diltiazem 240 MG 24 hr capsule Commonly known as:  CARDIZEM CD Take 1 capsule (240 mg total) by mouth daily. What changed:  medication strength  how much to take  how to take this  when to take this   losartan 100 MG tablet Commonly known as:  COZAAR Take 100 mg by mouth daily.   pantoprazole 40 MG tablet Commonly known as:  PROTONIX Take 1 tablet (40 mg total) by mouth daily.        Tashima Scarpulla 03/26/2017

## 2017-03-26 NOTE — Patient Instructions (Signed)
Laura Davenport at [facility] Discharge Instructions  RECOMMENDATIONS MADE BY THE CONSULTANT AND ANY TEST RESULTS WILL BE SENT TO YOUR REFERRING PHYSICIAN.  You received your aranesp injection today Follow up as scheduled.  Thank you for choosing [place] at [facility] to provide your oncology and hematology care.  To afford each patient quality time with our provider, please arrive at least 15 minutes before your scheduled appointment time.    If you have a lab appointment with the [facility] please come in thru the  Main Entrance and check in at the main information desk  You need to re-schedule your appointment should you arrive 10 or more minutes late.  We strive to give you quality time with our providers, and arriving late affects you and other patients whose appointments are after yours.  Also, if you no show three or more times for appointments you may be dismissed from the clinic at the providers discretion.     Again, thank you for choosing [facility].  Our hope is that these requests will decrease the amount of time that you wait before being seen by our physicians.       _____________________________________________________________  Should you have questions after your visit to [facility], please contact our office at [phone] between the hours of 8:30 a.m. and 4:30 p.m.  Voicemails left after 4:30 p.m. will not be returned until the following business day.  For prescription refill requests, have your pharmacy contact our office.       Resources For Cancer Patients and their Caregivers ? American Cancer Society: Can assist with transportation, wigs, general needs, runs Look Good Feel Better.        782-853-6060 ? Cancer Care: Provides financial assistance, online support groups, medication/co-pay assistance.  1-800-813-HOPE 623-584-8329) ? [place] Assists [place]  cancer patients and their families through emotional , educational and financial support.  217-384-3198 ? [place] DSS Where to apply for food stamps, Medicaid and utility assistance. 769-742-4747 ? RCATS: Transportation to medical appointments. 878 703 2151 ? Social Security Administration: May apply for disability if have a Stage IV cancer. [phone])099-5406 [phone])[phone] ? [employer], Disability and Transit Services: Assists with nutrition, care and transit needs. [place] Support Programs: @10RELATIVEDAYS @ > Cancer Support Group  2nd Tuesday of the month 1pm-2pm, Journey Room  > Creative Journey  3rd Tuesday of the month 1130am-1pm, Journey Room  > Look Good Feel Better  1st Wednesday of the month 10am-12 noon, Journey Room (Call [place] to register [phone])

## 2017-03-26 NOTE — Progress Notes (Signed)
  Discharge instructions read to family and patient. Both verbalized understanding of all instructions. Discharged with family to appointment with cancer clinic

## 2017-03-27 LAB — URINE CULTURE: Culture: >=100000 — AB

## 2017-04-02 ENCOUNTER — Ambulatory Visit (HOSPITAL_COMMUNITY): Payer: Self-pay

## 2017-04-02 ENCOUNTER — Other Ambulatory Visit (HOSPITAL_COMMUNITY): Payer: Self-pay

## 2017-04-02 ENCOUNTER — Ambulatory Visit (HOSPITAL_COMMUNITY): Payer: Self-pay | Admitting: Adult Health

## 2017-04-03 DIAGNOSIS — I10 Essential (primary) hypertension: Secondary | ICD-10-CM | POA: Diagnosis not present

## 2017-04-03 DIAGNOSIS — N390 Urinary tract infection, site not specified: Secondary | ICD-10-CM | POA: Diagnosis not present

## 2017-04-03 DIAGNOSIS — G309 Alzheimer's disease, unspecified: Secondary | ICD-10-CM | POA: Diagnosis not present

## 2017-04-05 DIAGNOSIS — E119 Type 2 diabetes mellitus without complications: Secondary | ICD-10-CM | POA: Diagnosis not present

## 2017-04-05 IMAGING — CR DG CHEST 2V
2 series · 2 of 2 positions shown · non-contrast
Comparison: 12/04/2014

CLINICAL DATA: Fever and tachycardia today.

EXAM:
CHEST  2 VIEW

[w chest lat]
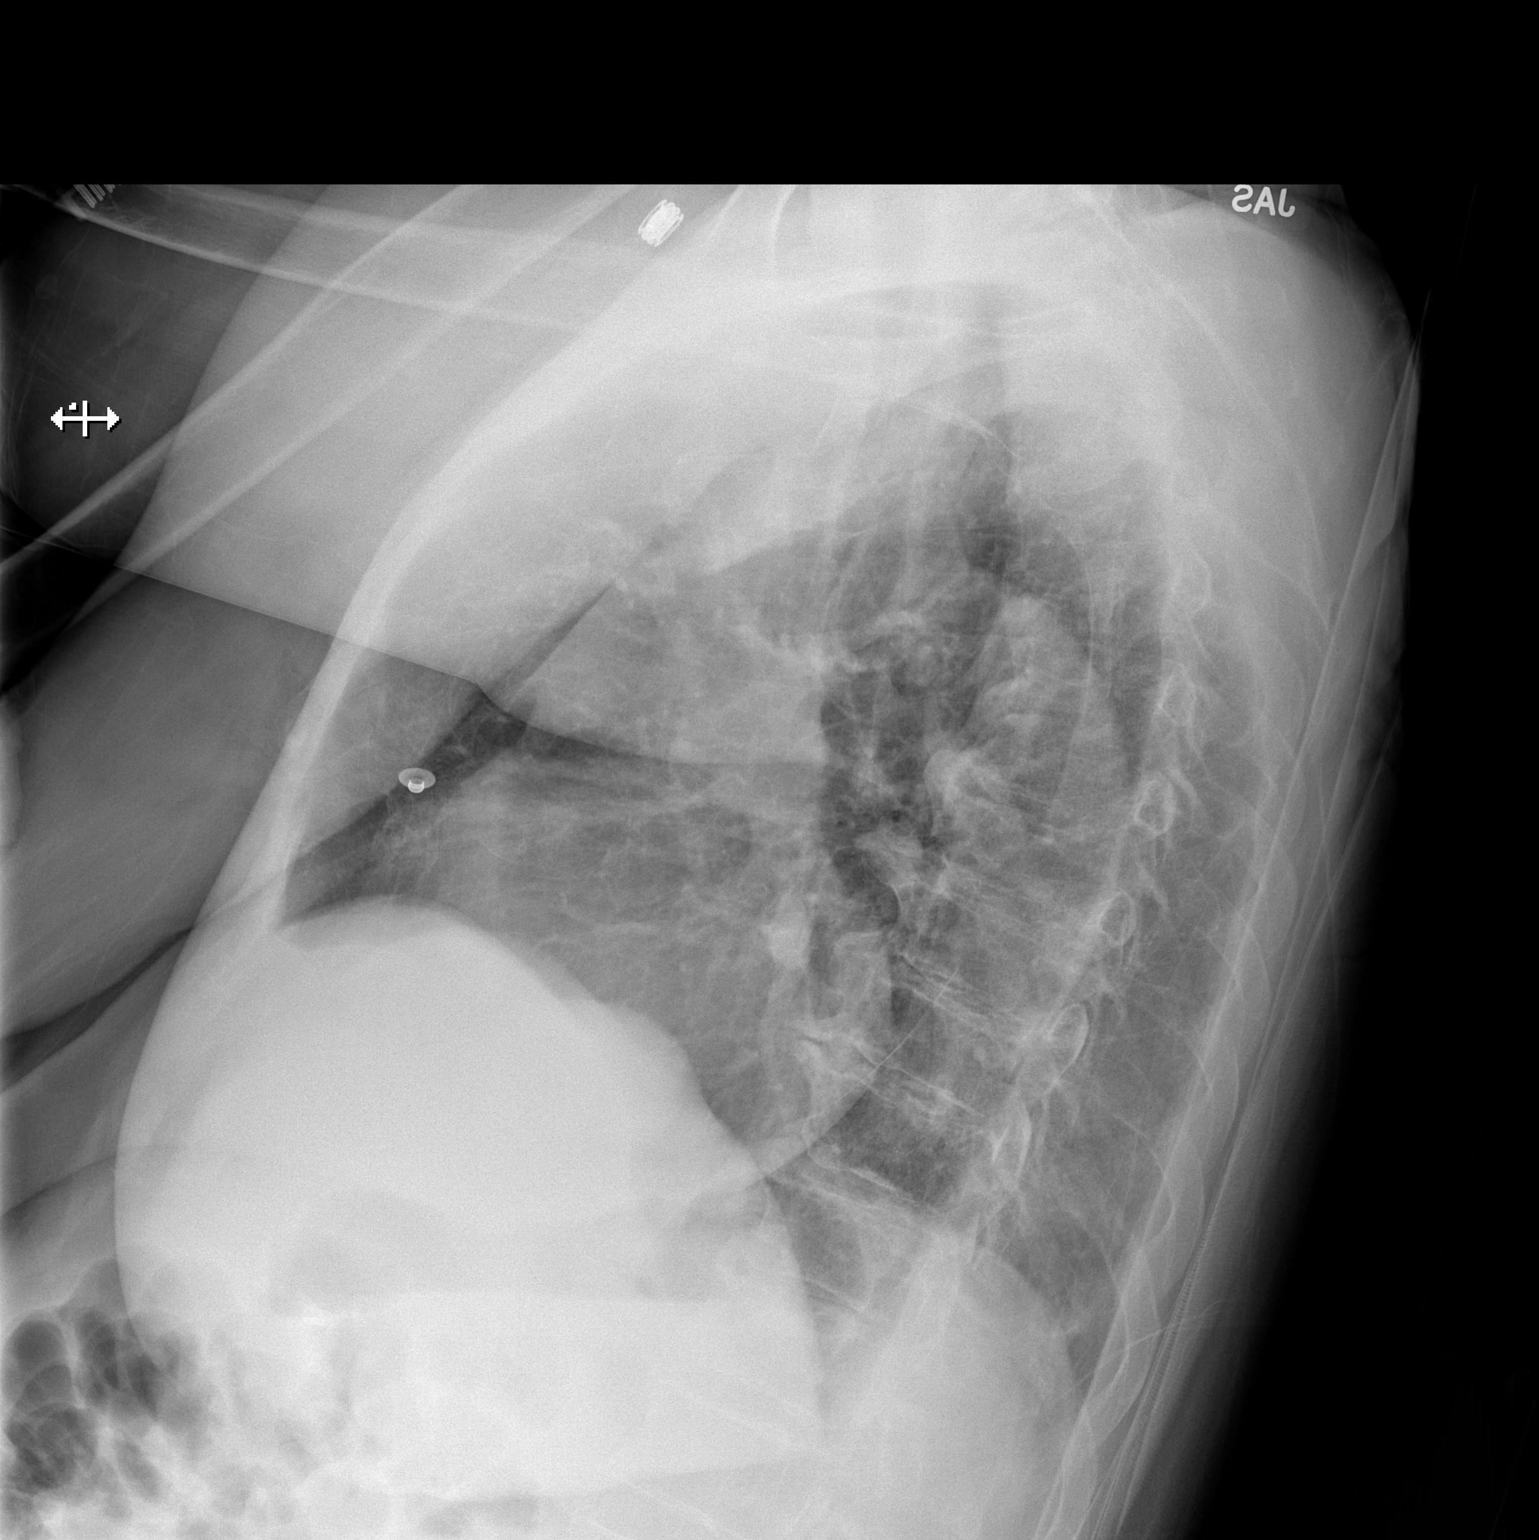

[x chest ap]
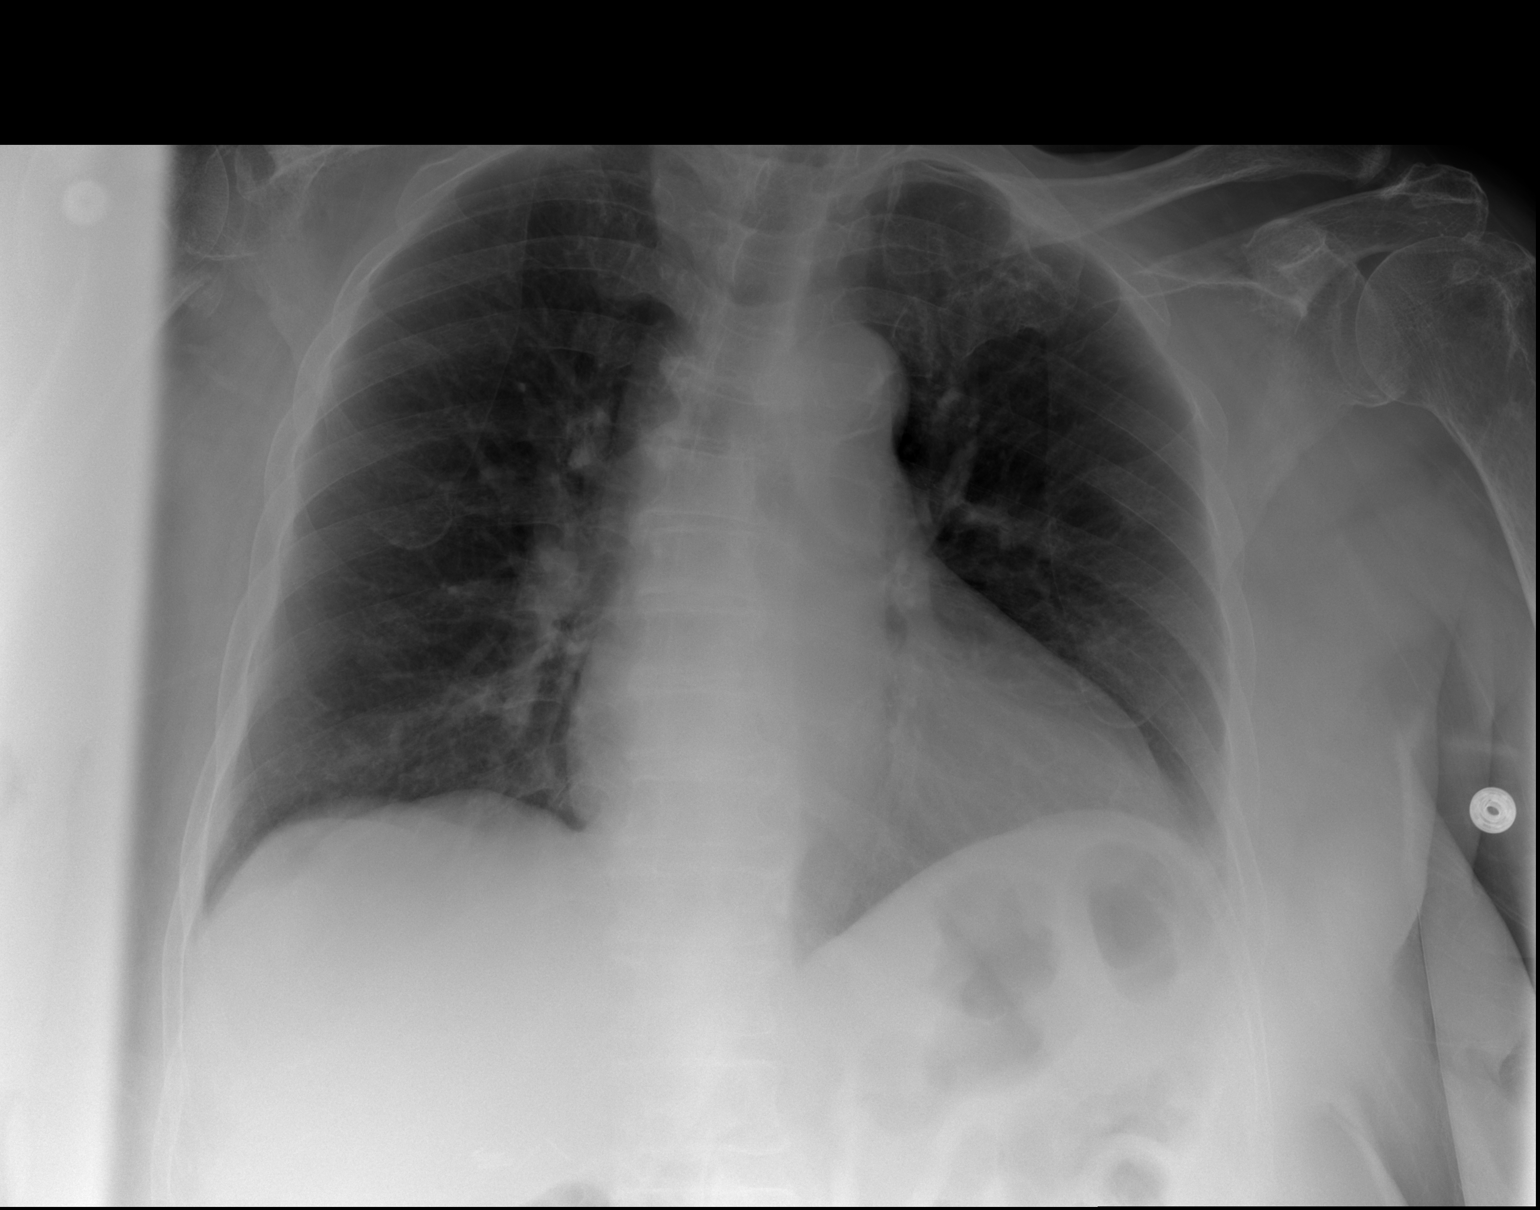

[2 of 2 positions shown; findings below may reference images not displayed]

FINDINGS: The heart is enlarged but stable. Stable tortuosity and
calcification of the thoracic aorta. The lungs are clear. No
infiltrates or effusions. The bony thorax is intact.
IMPRESSION: Stable cardiac enlargement.  No acute pulmonary findings.

## 2017-04-06 IMAGING — US US RENAL
1 series · 14 of 16 positions shown · non-contrast
Comparison: CT abdomen pelvis 11/01/2012

CLINICAL DATA: Patient with acute renal failure.

EXAM:
RENAL/URINARY TRACT ULTRASOUND COMPLETE

[Series 1: us renal · 0.23mm/px · 14 of 16 slices shown]
[im 1/16]
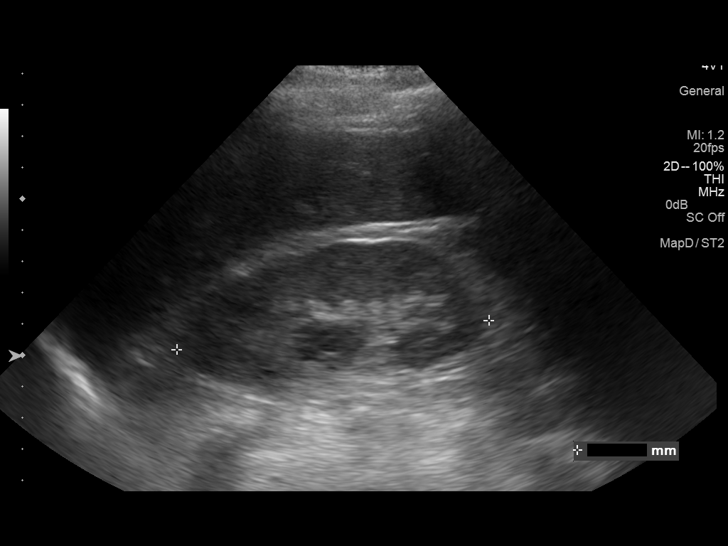
[im 2/16]
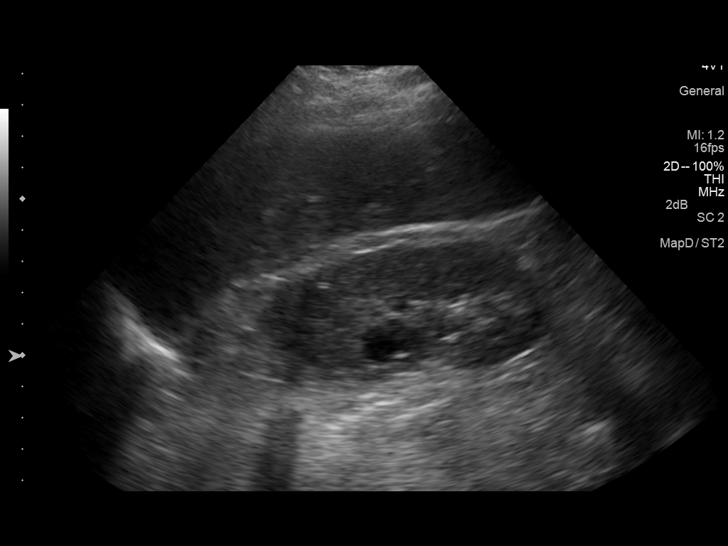
[im 3/16]
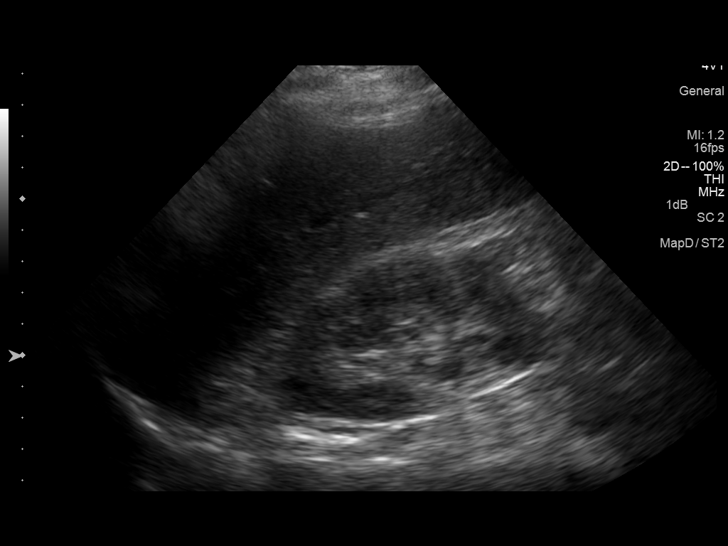
[im 5/16]
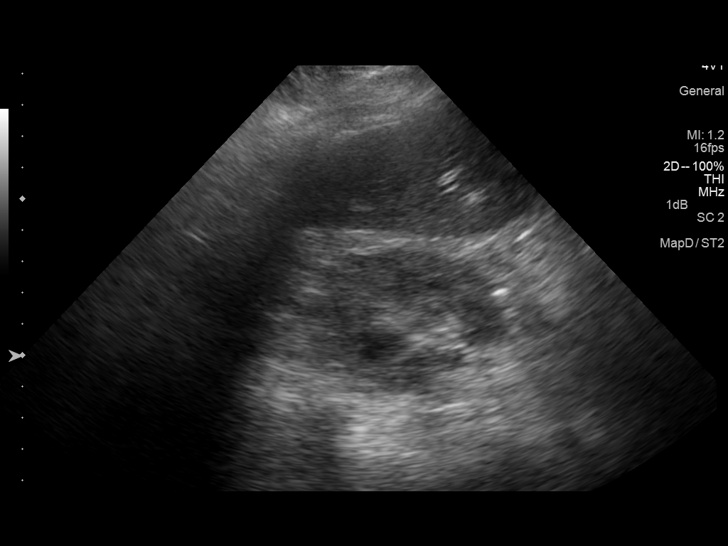
[im 6/16]
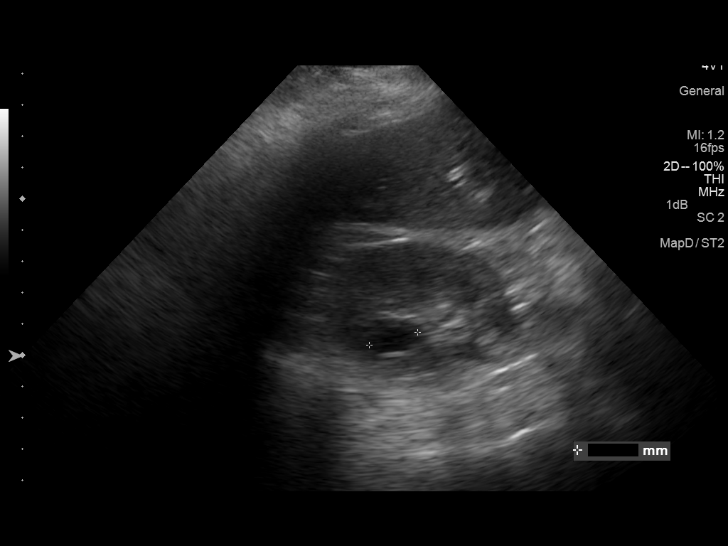
[im 7/16]
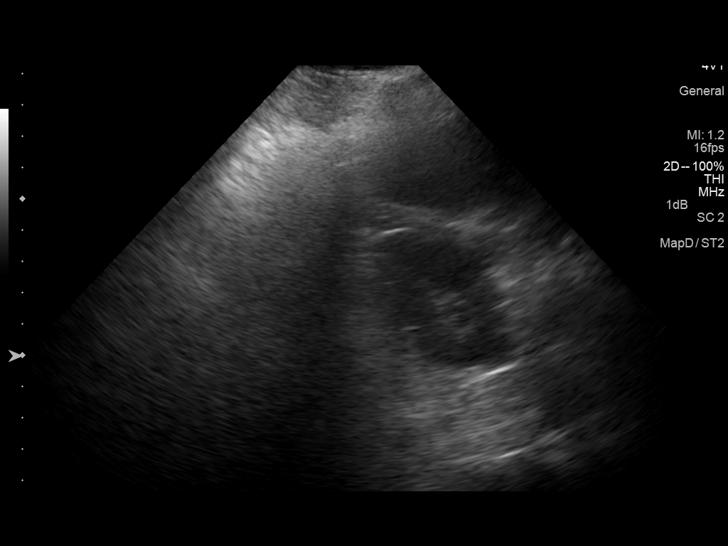
[im 8/16]
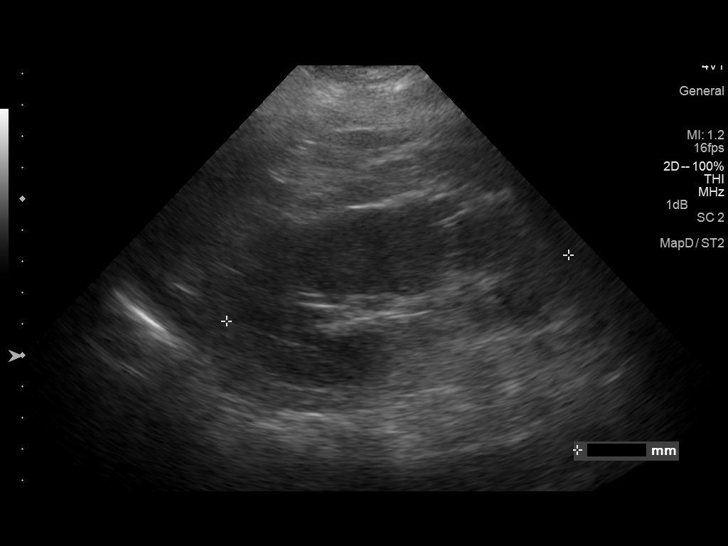
[im 9/16]
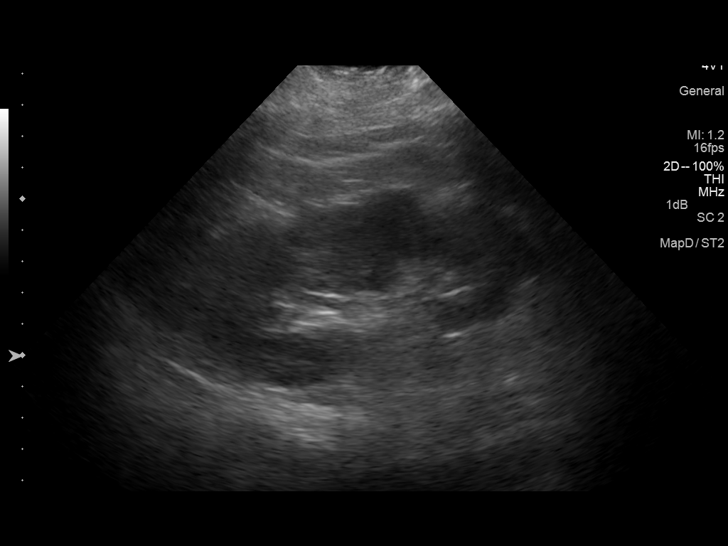
[im 10/16]
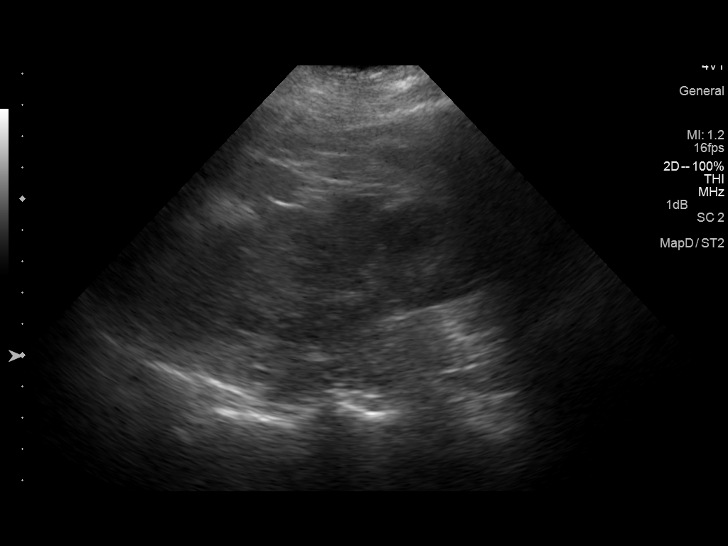
[im 11/16]
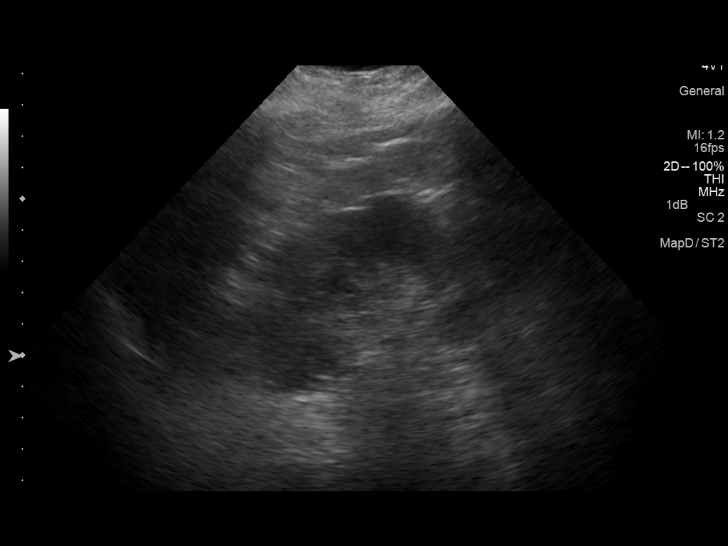
[im 13/16]
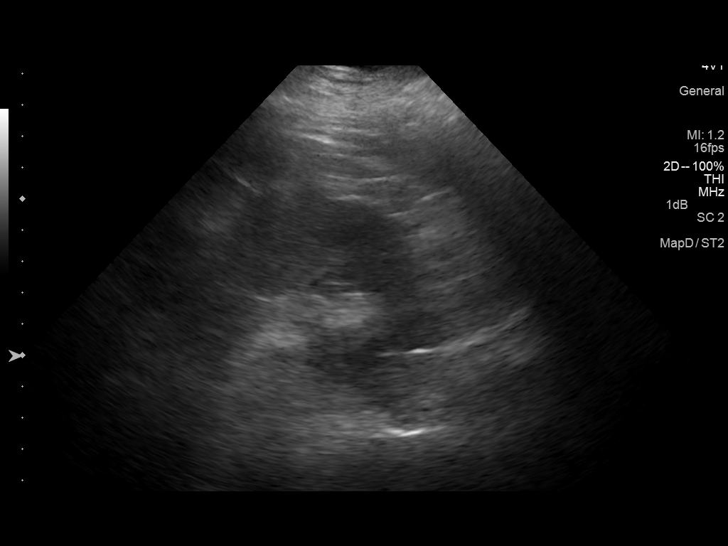
[im 14/16]
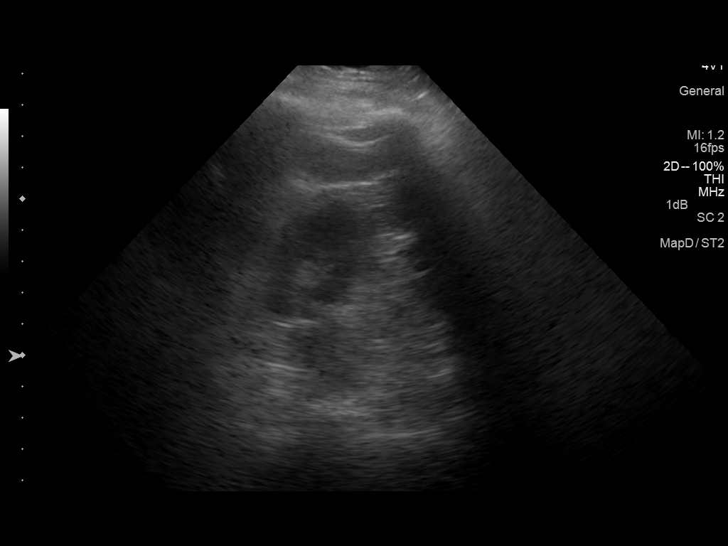
[im 15/16]
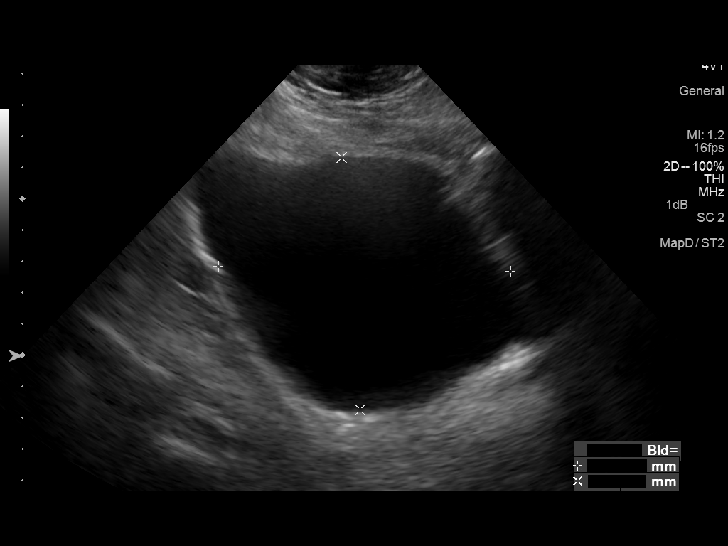
[im 16/16]
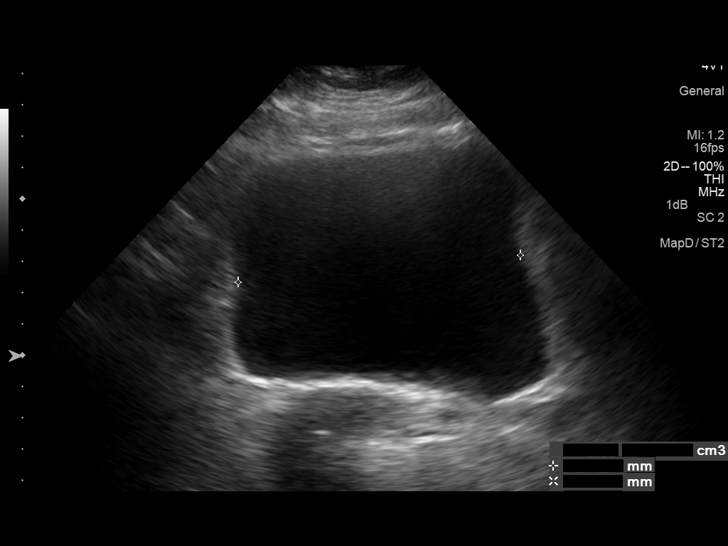

[14 of 16 positions shown; findings below may reference images not displayed]

FINDINGS: Right Kidney:

Length: 10.0 cm. No hydronephrosis. There is a 1.7 x 1.2 x 1.6 cm
hypoechoic lesion within the right kidney, potentially representing
a small cyst. The renal cortex is increased in echogenicity.

Left Kidney:

Length: 11.1 cm.  No hydronephrosis.  No definite renal mass.

Bladder:

Appears normal for degree of bladder distention.
IMPRESSION: No hydronephrosis.

Slightly increased renal cortical echogenicity suggestive of chronic
renal disease.

## 2017-04-07 ENCOUNTER — Other Ambulatory Visit: Payer: Self-pay | Admitting: Nurse Practitioner

## 2017-04-07 DIAGNOSIS — K219 Gastro-esophageal reflux disease without esophagitis: Secondary | ICD-10-CM

## 2017-04-23 ENCOUNTER — Encounter (HOSPITAL_BASED_OUTPATIENT_CLINIC_OR_DEPARTMENT_OTHER): Payer: Medicare Other

## 2017-04-23 ENCOUNTER — Encounter (HOSPITAL_COMMUNITY): Payer: Medicare Other | Attending: Oncology

## 2017-04-23 ENCOUNTER — Encounter (HOSPITAL_COMMUNITY): Payer: Medicare Other | Attending: Adult Health | Admitting: Adult Health

## 2017-04-23 VITALS — BP 128/53 | HR 70 | Temp 98.3°F | Resp 16

## 2017-04-23 DIAGNOSIS — D631 Anemia in chronic kidney disease: Secondary | ICD-10-CM | POA: Diagnosis not present

## 2017-04-23 DIAGNOSIS — N183 Chronic kidney disease, stage 3 unspecified: Secondary | ICD-10-CM

## 2017-04-23 DIAGNOSIS — R531 Weakness: Secondary | ICD-10-CM

## 2017-04-23 DIAGNOSIS — N184 Chronic kidney disease, stage 4 (severe): Secondary | ICD-10-CM

## 2017-04-23 DIAGNOSIS — D649 Anemia, unspecified: Secondary | ICD-10-CM | POA: Diagnosis not present

## 2017-04-23 DIAGNOSIS — D519 Vitamin B12 deficiency anemia, unspecified: Secondary | ICD-10-CM

## 2017-04-23 DIAGNOSIS — E538 Deficiency of other specified B group vitamins: Secondary | ICD-10-CM | POA: Diagnosis not present

## 2017-04-23 DIAGNOSIS — N189 Chronic kidney disease, unspecified: Principal | ICD-10-CM

## 2017-04-23 LAB — CBC WITH DIFFERENTIAL/PLATELET
Basophils Absolute: 0 10*3/uL (ref 0.0–0.1)
Basophils Relative: 0 %
Eosinophils Absolute: 0.1 10*3/uL (ref 0.0–0.7)
Eosinophils Relative: 2 %
HCT: 32 % — ABNORMAL LOW (ref 36.0–46.0)
Hemoglobin: 11 g/dL — ABNORMAL LOW (ref 12.0–15.0)
Lymphocytes Relative: 44 %
Lymphs Abs: 2.9 10*3/uL (ref 0.7–4.0)
MCH: 31.6 pg (ref 26.0–34.0)
MCHC: 34.4 g/dL (ref 30.0–36.0)
MCV: 92 fL (ref 78.0–100.0)
Monocytes Absolute: 0.4 10*3/uL (ref 0.1–1.0)
Monocytes Relative: 5 %
Neutro Abs: 3.3 10*3/uL (ref 1.7–7.7)
Neutrophils Relative %: 49 %
Platelets: 204 10*3/uL (ref 150–400)
RBC: 3.48 MIL/uL — ABNORMAL LOW (ref 3.87–5.11)
RDW: 14.3 % (ref 11.5–15.5)
WBC: 6.7 10*3/uL (ref 4.0–10.5)

## 2017-04-23 LAB — BASIC METABOLIC PANEL
Anion gap: 7 (ref 5–15)
BUN: 28 mg/dL — ABNORMAL HIGH (ref 6–20)
CO2: 20 mmol/L — ABNORMAL LOW (ref 22–32)
Calcium: 8.9 mg/dL (ref 8.9–10.3)
Chloride: 109 mmol/L (ref 101–111)
Creatinine, Ser: 1.77 mg/dL — ABNORMAL HIGH (ref 0.44–1.00)
GFR calc Af Amer: 29 mL/min — ABNORMAL LOW (ref >60)
GFR calc non Af Amer: 25 mL/min — ABNORMAL LOW (ref >60)
Glucose, Bld: 175 mg/dL — ABNORMAL HIGH (ref 65–99)
Potassium: 4 mmol/L (ref 3.5–5.1)
Sodium: 136 mmol/L (ref 135–145)

## 2017-04-23 LAB — IRON AND TIBC
Iron: 68 ug/dL (ref 28–170)
Saturation Ratios: 32 % — ABNORMAL HIGH (ref 10.4–31.8)
TIBC: 213 ug/dL — ABNORMAL LOW (ref 250–450)
UIBC: 145 ug/dL

## 2017-04-23 LAB — FERRITIN: Ferritin: 209 ng/mL (ref 11–307)

## 2017-04-23 LAB — VITAMIN B12: Vitamin B-12: 1131 pg/mL — ABNORMAL HIGH (ref 180–914)

## 2017-04-23 MED ORDER — DARBEPOETIN ALFA 60 MCG/0.3ML IJ SOSY
50.0000 ug | PREFILLED_SYRINGE | Freq: Once | INTRAMUSCULAR | Status: AC
  Administered 2017-04-23: 50 ug via SUBCUTANEOUS
  Filled 2017-04-23: qty 0.3

## 2017-04-23 NOTE — Patient Instructions (Signed)
[place] at [facility] Discharge Instructions  RECOMMENDATIONS MADE BY THE CONSULTANT AND ANY TEST RESULTS WILL BE SENT TO YOUR REFERRING PHYSICIAN.  Aranesp 50 mcg injection given as ordered.  Thank you for choosing [place] at [facility] to provide your oncology and hematology care.  To afford each patient quality time with our provider, please arrive at least 15 minutes before your scheduled appointment time.    If you have a lab appointment with the [facility] please come in thru the  Main Entrance and check in at the main information desk  You need to re-schedule your appointment should you arrive 10 or more minutes late.  We strive to give you quality time with our providers, and arriving late affects you and other patients whose appointments are after yours.  Also, if you no show three or more times for appointments you may be dismissed from the clinic at the providers discretion.     Again, thank you for choosing [facility].  Our hope is that these requests will decrease the amount of time that you wait before being seen by our physicians.       _____________________________________________________________  Should you have questions after your visit to [facility], please contact our office at [phone] between the hours of 8:30 a.m. and 4:30 p.m.  Voicemails left after 4:30 p.m. will not be returned until the following business day.  For prescription refill requests, have your pharmacy contact our office.       Resources For Cancer Patients and their Caregivers ? American Cancer Society: Can assist with transportation, wigs, general needs, runs Look Good Feel Better.        360-702-7589 ? Cancer Care: Provides financial assistance, online support groups, medication/co-pay assistance.  1-800-813-HOPE 313-692-7227) ? [place] Assists [place] cancer patients and their  families through emotional , educational and financial support.  224-687-5194 ? [place] DSS Where to apply for food stamps, Medicaid and utility assistance. ([phone] 011-6017 ? RCATS: Transportation to medical appointments. (628)781-3408 ? Social Security Administration: May apply for disability if have a Stage IV cancer. 539-182-1670 867-381-2819 ? [employer], Disability and Transit Services: Assists with nutrition, care and transit needs. [place] Support Programs: @10RELATIVEDAYS @ > Cancer Support Group  2nd Tuesday of the month 1pm-2pm, Journey Room  > Creative Journey  3rd Tuesday of the month 1130am-1pm, Journey Room  > Look Good Feel Better  1st Wednesday of the month 10am-12 noon, Journey Room (Call [place] to register [phone])

## 2017-04-23 NOTE — Patient Instructions (Addendum)
[place] at [facility] Discharge Instructions  RECOMMENDATIONS MADE BY THE CONSULTANT AND ANY TEST RESULTS WILL BE SENT TO YOUR REFERRING PHYSICIAN.  You were seen today by Laura Craze, NP Continue getting monthly labs and Aranesp injection We will see you in 4 months  See schedulers up front for appointments    Thank you for choosing [place] at [facility] to provide your oncology and hematology care.  To afford each patient quality time with our provider, please arrive at least 15 minutes before your scheduled appointment time.    If you have a lab appointment with the [facility] please come in thru the  Main Entrance and check in at the main information desk  You need to re-schedule your appointment should you arrive 10 or more minutes late.  We strive to give you quality time with our providers, and arriving late affects you and other patients whose appointments are after yours.  Also, if you no show three or more times for appointments you may be dismissed from the clinic at the providers discretion.     Again, thank you for choosing [facility].  Our hope is that these requests will decrease the amount of time that you wait before being seen by our physicians.       _____________________________________________________________  Should you have questions after your visit to [facility], please contact our office at [phone] between the hours of 8:30 a.m. and 4:30 p.m.  Voicemails left after 4:30 p.m. will not be returned until the following business day.  For prescription refill requests, have your pharmacy contact our office.       Resources For Cancer Patients and their Caregivers ? American Cancer Society: Can assist with transportation, wigs, general needs, runs Look Good Feel Better.        ([phone] ? Cancer Care: Provides financial assistance, online support groups,  medication/co-pay assistance.  1-800-813-HOPE 814-396-4357) ? [place] Assists [place] cancer patients and their families through emotional , educational and financial support.  765-675-7819 ? [place] DSS Where to apply for food stamps, Medicaid and utility assistance. 219-534-2288 ? RCATS: Transportation to medical appointments. 701 718 4462 ? Social Security Administration: May apply for disability if have a Stage IV cancer. [phone] [phone])199-9107 ? [employer], Disability and Transit Services: Assists with nutrition, care and transit needs. [place] Support Programs: @10RELATIVEDAYS @ > Cancer Support Group  2nd Tuesday of the month 1pm-2pm, Journey Room  > Creative Journey  3rd Tuesday of the month 1130am-1pm, Journey Room  > Look Good Feel Better  1st Wednesday of the month 10am-12 noon, Journey Room (Call [place] to register [phone])

## 2017-04-23 NOTE — Progress Notes (Signed)
Laura Davenport, [place] [address]   CLINIC:  Medical Oncology/Hematology  PCP:  Laura Noble, MD [address] [place] [address] [phone]   REASON FOR VISIT:  Follow-up for Anemia AND vitamin B12 deficiency  CURRENT THERAPY: Aranesp 50 mcg every 4 weeks AND vitamin B12 injections monthly (administered at home).     HISTORY OF PRESENT ILLNESS:  (From Laura Crigler, PA-C's last note on 12/11/16)       INTERVAL HISTORY:  Laura Davenport 83 y.o. female returns for routine follow-up for anemia and vitamin B12 deficiency.   Here today with her daughter. Seen in a wheelchair.   Chart reviewed. Since her last visit to the cancer center, she had a hospitalization from 03/24/17-03/26/17 for altered mental status, thought to be secondary to UTI. Mental status returned to baseline with antibiotics.   She was recently started on Aricept by her PCP to help with memory loss.  Patient states that she finds difficulty in finding the right words and with remembering things.  Her family has noticed a gradual decline in her memory over the past few years.   Overall though, she tells me she has been feeling pretty well. Appetite 100%; energy levels 75%.  She feels a little weak today and her (R) knee is bothering her today; she has had chronic difficulty with mobility in her knees. She was unable to stand today for a weight.  Her daughter tells me that Laura Davenport is able to do most ADLs with little assistance; she makes her own bed, prepares her own food, etc. Ambulates with a walker at home.  She is always "supervised" by one of her children around-the-clock at her home and they provide assistance as needed.   Denies any bleeding episodes including blood in her stools, dark/tarry stools, hematuria, or vaginal bleeding. Denies dysuria. Continues to receive vitamin B12 injections monthly at home, which are administered by her family.  Due for next dose of  Aranesp today, which was recently changed to monthly at her last visit in 12/2016.     REVIEW OF SYSTEMS:  Review of Systems  Constitutional: Positive for fatigue (at times). Negative for chills and fever.  HENT:  Negative.  Negative for lump/mass and nosebleeds.   Eyes: Negative.   Respiratory: Negative.  Negative for cough and shortness of breath.   Cardiovascular: Negative.  Negative for chest pain and leg swelling.  Gastrointestinal: Negative.  Negative for abdominal pain, blood in stool, constipation, diarrhea, nausea and vomiting.  Endocrine: Negative.   Genitourinary: Negative.  Negative for dysuria, hematuria and vaginal bleeding.   Musculoskeletal: Negative for arthralgias.  Skin: Negative.  Negative for rash.  Neurological: Positive for extremity weakness. Negative for dizziness and headaches.  Hematological: Negative.  Negative for adenopathy. Does not bruise/bleed easily.  Psychiatric/Behavioral: Negative.  Negative for depression and sleep disturbance. The patient is not nervous/anxious.      PAST MEDICAL/SURGICAL HISTORY:  Past Medical History:  Diagnosis Date  . Anemia in chronic kidney disease 01/20/2016  . B12 deficiency anemia 10/25/2015  . Chronic renal disease, stage 3, moderately decreased glomerular filtration rate (GFR) between 30-59 mL/min/1.73 square meter 01/07/2016  . Depression   . Essential hypertension   . GI bleed    Severe gastritis and duodenal ulcers by EGD May 2016   . HLD (hyperlipidemia)   . PSVT (paroxysmal supraventricular tachycardia) ([facility])   . Septic arthritis (HCC)    MRSA, left knee   .  Type 2 diabetes mellitus ([facility])    Past Surgical History:  Procedure Laterality Date  . CHOLECYSTECTOMY N/A 11/04/2012   Procedure: LAPAROSCOPIC CHOLECYSTECTOMY;  Surgeon: Jamesetta So, MD;  Location: [facility];  Service: General;  Laterality: N/A;  . ESOPHAGOGASTRODUODENOSCOPY N/A 01/06/2015   SLF: mild esophagitis  . I&D EXTREMITY Left 12/24/2014    Procedure: IRRIGATION AND DEBRIDEMENT EXTREMITY AND ARTHROSCOPY KNEE;  Surgeon: Renette Butters, MD;  Location: [facility];  Service: Orthopedics;  Laterality: Left;  . KNEE SURGERY    . SAVORY DILATION  01/06/2015   Procedure: SAVORY DILATION;  Surgeon: Danie Binder, MD;  Location: [facility];  Service: Endoscopy;;     SOCIAL HISTORY:  Social History   Social History  . Marital status: Widowed    Spouse name: N/A  . Number of children: 66  . Years of education: N/A   Occupational History  . retired; [employer] History Main Topics  . Smoking status: Never Smoker  . Smokeless tobacco: Never Used     Comment: Never smoked  . Alcohol use No  . Drug use: No  . Sexual activity: Not on file   Other Topics Concern  . Not on file   Social History Narrative   Lives alone   3 daughters & granddaughter nearby   Lost 1 son-strep          FAMILY HISTORY:  Family History  Problem Relation Age of Onset  . Family history unknown: Yes    CURRENT MEDICATIONS:  Outpatient Encounter Prescriptions as of 04/23/2017  Medication Sig Note  . acetaminophen (TYLENOL) 325 MG tablet Take 650 mg by mouth every 6 (six) hours as needed for mild pain or moderate pain.    . cyanocobalamin (,VITAMIN B-12,) 1000 MCG/ML injection ADMINISTER 1 ML(1000 MCG) UNDER THE SKIN EVERY 30 DAYS   . diltiazem (CARDIZEM CD) 240 MG 24 hr capsule Take 1 capsule (240 mg total) by mouth daily.   Marland Kitchen donepezil (ARICEPT) 5 MG tablet Take 5 mg by mouth at bedtime.   Marland Kitchen losartan (COZAAR) 100 MG tablet Take 100 mg by mouth daily.  04/19/2015: Received from: External Pharmacy  . pantoprazole (PROTONIX) 40 MG tablet TAKE 1 TABLET(40 MG) BY MOUTH DAILY   . [EXPIRED] Darbepoetin Alfa (ARANESP) injection 50 mcg     No facility-administered encounter medications on file as of 04/23/2017.     ALLERGIES:  Allergies  Allergen Reactions  . Oxycodone Hives     PHYSICAL EXAM:  ECOG Performance status: 2 -  Symptomatic; requires occasional assistance  Vitals:   04/23/17 0954  BP: (!) 128/53  Pulse: 70  Resp: 16  Temp: 98.3 F (36.8 C)  SpO2: 100%   There were no vitals filed for this visit. (pt unable to stand for weight)   Physical Exam  Constitutional: She is oriented to person, place, and time.  Frail female in no acute distress seen seated in wheelchair   HENT:  Head: Normocephalic.  Mouth/Throat: Oropharynx is clear and moist. No oropharyngeal exudate.  Eyes: Pupils are equal, round, and reactive to light. Conjunctivae are normal. No scleral icterus.  Neck: Normal range of motion. Neck supple.  Cardiovascular: Normal rate, regular rhythm and normal heart sounds.   Pulmonary/Chest: Effort normal and breath sounds normal. No respiratory distress.  Abdominal: Soft. Bowel sounds are normal. There is no tenderness.  Musculoskeletal: Normal range of motion. She exhibits no edema.  Lymphadenopathy:    She has no  cervical adenopathy.       Right: No supraclavicular adenopathy present.       Left: No supraclavicular adenopathy present.  Neurological: She is alert and oriented to person, place, and time. No cranial nerve deficit.  Periodic neck tremor/tic  Skin: Skin is warm and dry. No rash noted.  Psychiatric: Mood, memory, affect and judgment normal.  Mental status and memory appropriate during visit today.   Nursing note and vitals reviewed.    LABORATORY DATA:  I have reviewed the labs as listed.  CBC    Component Value Date/Time   WBC 6.7 04/23/2017 0914   RBC 3.48 (L) 04/23/2017 0914   HGB 11.0 (L) 04/23/2017 0914   HCT 32.0 (L) 04/23/2017 0914   PLT 204 04/23/2017 0914   MCV 92.0 04/23/2017 0914   MCH 31.6 04/23/2017 0914   MCHC 34.4 04/23/2017 0914   RDW 14.3 04/23/2017 0914   LYMPHSABS 2.9 04/23/2017 0914   MONOABS 0.4 04/23/2017 0914   EOSABS 0.1 04/23/2017 0914   BASOSABS 0.0 04/23/2017 0914   CMP Latest Ref Rng & Units 04/23/2017 03/25/2017 03/24/2017    Glucose 65 - 99 mg/dL 175(H) 96 126(H)  BUN 6 - 20 mg/dL 28(H) 24(H) 28(H)  Creatinine 0.44 - 1.00 mg/dL 1.77(H) 1.15(H) 1.35(H)  Sodium 135 - 145 mmol/L 136 140 141  Potassium 3.5 - 5.1 mmol/L 4.0 3.8 3.7  Chloride 101 - 111 mmol/L 109 105 108  CO2 22 - 32 mmol/L 20(L) 28 25  Calcium 8.9 - 10.3 mg/dL 8.9 9.2 9.3  Total Protein 6.5 - 8.1 g/dL - - 7.6  Total Bilirubin 0.3 - 1.2 mg/dL - - 0.4  Alkaline Phos 38 - 126 U/L - - 65  AST 15 - 41 U/L - - 16  ALT 14 - 54 U/L - - 8(L)    PENDING LABS:    DIAGNOSTIC IMAGING:    PATHOLOGY:    ASSESSMENT & PLAN:   Anemia:  -Likely secondary to chronic kidney disease. Possible component of iron deficiency as well with EGD revealing erosive gastritis and moderate duodenitis.  -No reported bleeding episodes.  -Hgb 11 g/dL today. Aranesp injections were changed to every 4 weeks about 4 months ago. There have been a few instances where she has not required injection d/t Hgb >11. If her hemoglobin consistently remains >11, then can consider either decreasing dose or increasing time between injections to every 6 weeks.  -Ferritin pending.  -Continue monthly labs and Aranesp 50 mcg injections for now.  -Return to cancer center in 4 months for follow-up with labs and subsequent injection.   CKD:  -Stage 4 today with EGFR 29. Historically, her EGFR has been in the 65s.  BUN/CRE are elevated a bit more than her baseline as well at 28/1.77 today. I suspect she may have some dehydration given recent hospitalization 1 month ago for UTI and likely not drinking enough fluids.  -Encouraged her to force non-caffeinated fluids as tolerated. We discussed several examples of fluids she could try; she agrees to work on drinking more fluids.   Vitamin B12 deficiency: -Continue monthly vitamin B12 injections at home as directed.   LE weakness:  -Has been chronic and has improved per daughter.  Encouraged patient to continue to perform exercises and remain  active as tolerated. Recommended continued use of assistance devices and falls precautions.         Dispo:  -Continue Aranesp injections every 4 weeks; standing orders for CBC with diff placed today.  -  Return to cancer center in 4 months for follow-up with labs and subsequent injection.      All questions were answered to patient's stated satisfaction. Encouraged patient to call with any new concerns or questions before her next visit to the cancer center and we can certain see her sooner, if needed.    Plan of care discussed with Dr. Talbert Cage, who agrees with the above aforementioned.    Orders placed this encounter:  Orders Placed This Encounter  Procedures  . CBC with Differential/Platelet  . Ferritin  . Iron and TIBC  . Basic metabolic panel  . CBC with Differential/Platelet      Mike Craze, NP [place] [phone]

## 2017-04-23 NOTE — Progress Notes (Signed)
Laura Davenport presents today for injection per MD orders. Aranesp 50 mcg administered SQ in left Abdomen. Administration without incident. Patient tolerated well. Stable on discharge home via wheelchair with daughter.

## 2017-05-21 ENCOUNTER — Encounter (HOSPITAL_COMMUNITY): Payer: Medicare Other | Attending: Oncology

## 2017-05-21 ENCOUNTER — Encounter (HOSPITAL_COMMUNITY): Payer: Medicare Other

## 2017-05-21 DIAGNOSIS — D631 Anemia in chronic kidney disease: Secondary | ICD-10-CM | POA: Diagnosis not present

## 2017-05-21 DIAGNOSIS — D519 Vitamin B12 deficiency anemia, unspecified: Secondary | ICD-10-CM | POA: Insufficient documentation

## 2017-05-21 DIAGNOSIS — N189 Chronic kidney disease, unspecified: Secondary | ICD-10-CM | POA: Diagnosis not present

## 2017-05-21 LAB — CBC WITH DIFFERENTIAL/PLATELET
Basophils Absolute: 0 10*3/uL (ref 0.0–0.1)
Basophils Relative: 0 %
Eosinophils Absolute: 0.1 10*3/uL (ref 0.0–0.7)
Eosinophils Relative: 1 %
HCT: 33.1 % — ABNORMAL LOW (ref 36.0–46.0)
Hemoglobin: 11.1 g/dL — ABNORMAL LOW (ref 12.0–15.0)
Lymphocytes Relative: 52 %
Lymphs Abs: 4 10*3/uL (ref 0.7–4.0)
MCH: 31 pg (ref 26.0–34.0)
MCHC: 33.5 g/dL (ref 30.0–36.0)
MCV: 92.5 fL (ref 78.0–100.0)
Monocytes Absolute: 0.4 10*3/uL (ref 0.1–1.0)
Monocytes Relative: 5 %
Neutro Abs: 3.3 10*3/uL (ref 1.7–7.7)
Neutrophils Relative %: 42 %
Platelets: 237 10*3/uL (ref 150–400)
RBC: 3.58 MIL/uL — ABNORMAL LOW (ref 3.87–5.11)
RDW: 14.8 % (ref 11.5–15.5)
WBC: 7.7 10*3/uL (ref 4.0–10.5)

## 2017-05-21 NOTE — Progress Notes (Signed)
Aranesp injection held today since Hgb 11.1. Pt and family informed of this and verbalized understanding. Pt discharged via wheelchair in satisfactory condition accompanied by family

## 2017-06-18 ENCOUNTER — Other Ambulatory Visit (HOSPITAL_COMMUNITY): Payer: Self-pay

## 2017-06-18 ENCOUNTER — Ambulatory Visit (HOSPITAL_COMMUNITY): Payer: Self-pay

## 2017-06-18 ENCOUNTER — Encounter (HOSPITAL_COMMUNITY): Payer: Self-pay

## 2017-06-18 ENCOUNTER — Encounter (HOSPITAL_COMMUNITY): Payer: Medicare Other | Attending: Oncology

## 2017-06-18 ENCOUNTER — Encounter (HOSPITAL_BASED_OUTPATIENT_CLINIC_OR_DEPARTMENT_OTHER): Payer: Medicare Other

## 2017-06-18 VITALS — BP 124/46 | HR 75 | Temp 98.5°F | Resp 20

## 2017-06-18 DIAGNOSIS — D631 Anemia in chronic kidney disease: Secondary | ICD-10-CM | POA: Insufficient documentation

## 2017-06-18 DIAGNOSIS — N189 Chronic kidney disease, unspecified: Secondary | ICD-10-CM

## 2017-06-18 DIAGNOSIS — N183 Chronic kidney disease, stage 3 unspecified: Secondary | ICD-10-CM

## 2017-06-18 DIAGNOSIS — D519 Vitamin B12 deficiency anemia, unspecified: Secondary | ICD-10-CM

## 2017-06-18 LAB — CBC WITH DIFFERENTIAL/PLATELET
Basophils Absolute: 0 10*3/uL (ref 0.0–0.1)
Basophils Relative: 0 %
Eosinophils Absolute: 0.1 10*3/uL (ref 0.0–0.7)
Eosinophils Relative: 2 %
HCT: 33.1 % — ABNORMAL LOW (ref 36.0–46.0)
Hemoglobin: 10.6 g/dL — ABNORMAL LOW (ref 12.0–15.0)
Lymphocytes Relative: 50 %
Lymphs Abs: 3.9 10*3/uL (ref 0.7–4.0)
MCH: 30.4 pg (ref 26.0–34.0)
MCHC: 32 g/dL (ref 30.0–36.0)
MCV: 94.8 fL (ref 78.0–100.0)
Monocytes Absolute: 0.5 10*3/uL (ref 0.1–1.0)
Monocytes Relative: 6 %
Neutro Abs: 3.2 10*3/uL (ref 1.7–7.7)
Neutrophils Relative %: 42 %
Platelets: 200 10*3/uL (ref 150–400)
RBC: 3.49 MIL/uL — ABNORMAL LOW (ref 3.87–5.11)
RDW: 15.5 % (ref 11.5–15.5)
WBC: 7.7 10*3/uL (ref 4.0–10.5)

## 2017-06-18 MED ORDER — DARBEPOETIN ALFA 100 MCG/0.5ML IJ SOSY
PREFILLED_SYRINGE | INTRAMUSCULAR | Status: AC
  Filled 2017-06-18: qty 0.5

## 2017-06-18 MED ORDER — DARBEPOETIN ALFA 60 MCG/0.3ML IJ SOSY
50.0000 ug | PREFILLED_SYRINGE | Freq: Once | INTRAMUSCULAR | Status: AC
  Administered 2017-06-18: 50 ug via SUBCUTANEOUS

## 2017-06-18 NOTE — Progress Notes (Signed)
Patient tolerated aranesp shot with no complaints voiced.  Injection site clean and dry with no bruising or swelling noted at site.  Band aid applied.  VSS with discharge and left via wheelchair with family.  Instructed to keep scheduled appointments with understanding verbalized.

## 2017-06-18 NOTE — Patient Instructions (Signed)
[place] at [facility]  Discharge Instructions:  You received an aranesp shot today.  Keep next scheduled appointments and call for any questions or concerns.  _______________________________________________________________  Thank you for choosing [place] at [facility] to provide your oncology and hematology care.  To afford each patient quality time with our providers, please arrive at least 15 minutes before your scheduled appointment.  You need to re-schedule your appointment if you arrive 10 or more minutes late.  We strive to give you quality time with our providers, and arriving late affects you and other patients whose appointments are after yours.  Also, if you no show three or more times for appointments you may be dismissed from the clinic.  Again, thank you for choosing [place] at [place] hope is that these requests will allow you access to exceptional care and in a timely manner. _______________________________________________________________  If you have questions after your visit, please contact our office at [phone] between the hours of 8:30 a.m. and 5:00 p.m. Voicemails left after 4:30 p.m. will not be returned until the following business day. _______________________________________________________________  For prescription refill requests, have your pharmacy contact our office. _______________________________________________________________  Recommendations made by the consultant and any test results will be sent to your referring physician. _______________________________________________________________

## 2017-06-20 DIAGNOSIS — N183 Chronic kidney disease, stage 3 (moderate): Secondary | ICD-10-CM | POA: Diagnosis not present

## 2017-06-20 DIAGNOSIS — I10 Essential (primary) hypertension: Secondary | ICD-10-CM | POA: Diagnosis not present

## 2017-06-20 DIAGNOSIS — E119 Type 2 diabetes mellitus without complications: Secondary | ICD-10-CM | POA: Diagnosis not present

## 2017-06-27 DIAGNOSIS — G309 Alzheimer's disease, unspecified: Secondary | ICD-10-CM | POA: Diagnosis not present

## 2017-06-27 DIAGNOSIS — I10 Essential (primary) hypertension: Secondary | ICD-10-CM | POA: Diagnosis not present

## 2017-06-27 DIAGNOSIS — Z23 Encounter for immunization: Secondary | ICD-10-CM | POA: Diagnosis not present

## 2017-06-27 DIAGNOSIS — N183 Chronic kidney disease, stage 3 (moderate): Secondary | ICD-10-CM | POA: Diagnosis not present

## 2017-07-05 DIAGNOSIS — E119 Type 2 diabetes mellitus without complications: Secondary | ICD-10-CM | POA: Diagnosis not present

## 2017-07-16 ENCOUNTER — Encounter (HOSPITAL_COMMUNITY): Payer: Medicare Other | Attending: Oncology

## 2017-07-16 ENCOUNTER — Encounter (HOSPITAL_COMMUNITY): Payer: Self-pay

## 2017-07-16 ENCOUNTER — Encounter (HOSPITAL_BASED_OUTPATIENT_CLINIC_OR_DEPARTMENT_OTHER): Payer: Medicare Other

## 2017-07-16 VITALS — BP 136/60 | HR 72 | Temp 98.6°F | Resp 18

## 2017-07-16 DIAGNOSIS — D631 Anemia in chronic kidney disease: Secondary | ICD-10-CM | POA: Diagnosis not present

## 2017-07-16 DIAGNOSIS — D519 Vitamin B12 deficiency anemia, unspecified: Secondary | ICD-10-CM | POA: Diagnosis not present

## 2017-07-16 DIAGNOSIS — N183 Chronic kidney disease, stage 3 (moderate): Secondary | ICD-10-CM | POA: Diagnosis not present

## 2017-07-16 DIAGNOSIS — N189 Chronic kidney disease, unspecified: Secondary | ICD-10-CM | POA: Insufficient documentation

## 2017-07-16 LAB — CBC WITH DIFFERENTIAL/PLATELET
Basophils Absolute: 0 10*3/uL (ref 0.0–0.1)
Basophils Relative: 0 %
Eosinophils Absolute: 0.1 10*3/uL (ref 0.0–0.7)
Eosinophils Relative: 2 %
HCT: 31.6 % — ABNORMAL LOW (ref 36.0–46.0)
Hemoglobin: 10.9 g/dL — ABNORMAL LOW (ref 12.0–15.0)
Lymphocytes Relative: 51 %
Lymphs Abs: 3.8 10*3/uL (ref 0.7–4.0)
MCH: 33.6 pg (ref 26.0–34.0)
MCHC: 34.5 g/dL (ref 30.0–36.0)
MCV: 97.5 fL (ref 78.0–100.0)
Monocytes Absolute: 0.6 10*3/uL (ref 0.1–1.0)
Monocytes Relative: 8 %
Neutro Abs: 2.8 10*3/uL (ref 1.7–7.7)
Neutrophils Relative %: 39 %
Platelets: 199 10*3/uL (ref 150–400)
RBC: 3.24 MIL/uL — ABNORMAL LOW (ref 3.87–5.11)
RDW: 15.8 % — ABNORMAL HIGH (ref 11.5–15.5)
WBC: 7.3 10*3/uL (ref 4.0–10.5)

## 2017-07-16 MED ORDER — DARBEPOETIN ALFA 60 MCG/0.3ML IJ SOSY
50.0000 ug | PREFILLED_SYRINGE | Freq: Once | INTRAMUSCULAR | Status: AC
  Administered 2017-07-16: 50 ug via SUBCUTANEOUS

## 2017-07-16 MED ORDER — DARBEPOETIN ALFA 60 MCG/0.3ML IJ SOSY
PREFILLED_SYRINGE | INTRAMUSCULAR | Status: AC
  Filled 2017-07-16: qty 0.3

## 2017-07-16 NOTE — Progress Notes (Signed)
Margaretha Seeds tolerated Aranesp injection well without complaints or incident. Hgb 10.9 today. VSS Pt discharged via wheelchair in satisfactory condition accompanied by her daughter

## 2017-07-16 NOTE — Patient Instructions (Signed)
[facility] at [facility] Discharge Instructions  RECOMMENDATIONS MADE BY THE CONSULTANT AND ANY TEST RESULTS WILL BE SENT TO YOUR REFERRING PHYSICIAN.  Received Aranesp injection today. Follow-up as scheduled. Call clinic for any questions or concerns  Thank you for choosing [facility] at [facility] to provide your oncology and hematology care.  To afford each patient quality time with our provider, please arrive at least 15 minutes before your scheduled appointment time.    If you have a lab appointment with the Cancer Center please come in thru the  Main Entrance and check in at the main information desk  You need to re-schedule your appointment should you arrive 10 or more minutes late.  We strive to give you quality time with our providers, and arriving late affects you and other patients whose appointments are after yours.  Also, if you no show three or more times for appointments you may be dismissed from the clinic at the providers discretion.     Again, thank you for choosing [facility].  Our hope is that these requests will decrease the amount of time that you wait before being seen by our physicians.       _____________________________________________________________  Should you have questions after your visit to  Cancer Center, please contact our office at [phone] between the hours of 8:30 a.m. and 4:30 p.m.  Voicemails left after 4:30 p.m. will not be returned until the following business day.  For prescription refill requests, have your pharmacy contact our office.       Resources For Cancer Patients and their Caregivers ? American Cancer Society: Can assist with transportation, wigs, general needs, runs Look Good Feel Better.        1-888-227-6333 ? Cancer Care: Provides financial assistance, online support groups, medication/co-pay assistance.  1-800-813-HOPE (4673) ? [facility] Assists [place] cancer patients and their families through emotional , educational and financial support.  336-427-4357 ? [place] DSS Where to apply for food stamps, Medicaid and utility assistance. 336-342-1394 ? [facility]: Transportation to medical appointments. 336-347-2287 ? Social Security Administration: May apply for disability if have a Stage IV cancer. 336-342-7796 1-800-772-1213 ? [place] Aging, Disability and Transit Services: Assists with nutrition, care and transit needs. 336-349-2343  Cancer Center Support Programs: @10RELATIVEDAYS@ > Cancer Support Group  2nd Tuesday of the month 1pm-2pm, Journey Room  > Creative Journey  3rd Tuesday of the month 1130am-1pm, Journey Room  > Look Good Feel Better  1st Wednesday of the month 10am-12 noon, Journey Room (Call American Cancer Society to register 1-800-395-5775)   

## 2017-08-12 NOTE — Progress Notes (Signed)
Laura Davenport, [name] [address]   CLINIC:  Medical Oncology/Hematology  PCP:  [name], MD [address] [place] [address] [phone]   REASON FOR VISIT:  Follow-up for Anemia AND vitamin B12 deficiency  CURRENT THERAPY: Aranesp 50 mcg every 4 weeks AND vitamin B12 injections monthly (administered at home).     HISTORY OF PRESENT ILLNESS:  (From Laura Crigler, PA-C's last note on 12/11/16)       INTERVAL HISTORY:  [name] 83 y.o. female returns for routine follow-up for anemia and vitamin B12 deficiency.   Here today with her daughter. Seen in a wheelchair.   Overall, she tells me she has been feeling "pretty good."  Appetite 100%; energy level is 50%.  She supplements her diet with Ensure about once per week.  She denies any pain.  Denies any bleeding episodes including blood in her stools, hematuria, vaginal bleeding, nosebleeds, or gingival bleeding.  Denies any chest pain, shortness of breath, or dyspnea on exertion.  As with past visits, she was not able to stand to obtain a weight today.  However, we were able to weigh the wheelchair independently of the patient to ascertain her weight today which is largely stable.  Her daughter shares with me that she continues to do some of her ADLs with minimal assistance.  She does some of her own cooking, but only when absolutely necessary.  She has a walker and ambulates at home with walker.  Her family is with her around-the-clock at home and they provide assistance for her as needed.  Chart reviewed. Since her last visit to the cancer center, she had a hospitalization from 03/24/17-03/26/17 for altered mental status, thought to be secondary to UTI. Mental status returned to baseline with antibiotics.      REVIEW OF SYSTEMS:  Review of Systems  Constitutional: Positive for fatigue. Negative for chills and fever.  HENT:  Negative.  Negative for nosebleeds.   Eyes:  Negative.   Respiratory: Negative.  Negative for cough and shortness of breath.   Cardiovascular: Negative.  Negative for chest pain.  Gastrointestinal: Negative.  Negative for blood in stool.  Endocrine: Negative.   Genitourinary: Negative.  Negative for dysuria, hematuria and vaginal bleeding.   Skin: Negative.  Negative for rash.  Neurological: Positive for extremity weakness.  Hematological: Negative.  Does not bruise/bleed easily.  Psychiatric/Behavioral: Negative.      PAST MEDICAL/SURGICAL HISTORY:  Past Medical History:  Diagnosis Date  . Anemia in chronic kidney disease 01/20/2016  . B12 deficiency anemia 10/25/2015  . Chronic renal disease, stage 3, moderately decreased glomerular filtration rate (GFR) between 30-59 mL/min/1.73 square meter ([facility]) 01/07/2016  . Depression   . Essential hypertension   . GI bleed    Severe gastritis and duodenal ulcers by EGD May 2016   . HLD (hyperlipidemia)   . PSVT (paroxysmal supraventricular tachycardia) ([facility])   . Septic arthritis (H[facility])    MRSA, left knee   . Type 2 diabetes mellitus ([facility])    Past Surgical History:  Procedure Laterality Date  . CHOLECYSTECTOMY N/A 11/04/2012   Procedure: LAPAROSCOPIC CHOLECYSTECTOMY;  Surgeon: Jamesetta So, MD;  Location: [facility];  Service: General;  Laterality: N/A;  . ESOPHAGOGASTRODUODENOSCOPY N/A 01/06/2015   SLF: mild esophagitis  . I&D EXTREMITY Left 12/24/2014   Procedure: IRRIGATION AND DEBRIDEMENT EXTREMITY AND ARTHROSCOPY KNEE;  Surgeon: Renette Butters, MD;  Location: [facility];  Service: Orthopedics;  Laterality: Left;  .  KNEE SURGERY    . SAVORY DILATION  01/06/2015   Procedure: SAVORY DILATION;  Surgeon: Danie Binder, MD;  Location: [facility];  Service: Endoscopy;;     SOCIAL HISTORY:  Social History   Socioeconomic History  . Marital status: Widowed    Spouse name: Not on file  . Number of children: 41  . Years of education: Not on file  . Highest education level: Not on file    Social Needs  . Financial resource strain: Not on file  . Food insecurity - worry: Not on file  . Food insecurity - inability: Not on file  . Transportation needs - medical: Not on file  . Transportation needs - non-medical: Not on file  Occupational History  . Occupation: retired; Education officer, museum  Tobacco Use  . Smoking status: Never Smoker  . Smokeless tobacco: Never Used  . Tobacco comment: Never smoked  Substance and Sexual Activity  . Alcohol use: No    Alcohol/week: 0.0 oz  . Drug use: No  . Sexual activity: Not on file  Other Topics Concern  . Not on file  Social History Narrative   Lives alone   3 daughters & granddaughter nearby   Lost 1 son-strep          FAMILY HISTORY:  Family History  Family history unknown: Yes    CURRENT MEDICATIONS:  Outpatient Encounter Medications as of 08/13/2017  Medication Sig Note  . acetaminophen (TYLENOL) 325 MG tablet Take 650 mg by mouth every 6 (six) hours as needed for mild pain or moderate pain.    . cyanocobalamin (,VITAMIN B-12,) 1000 MCG/ML injection ADMINISTER 1 ML(1000 MCG) UNDER THE SKIN EVERY 30 DAYS   . diltiazem (CARDIZEM CD) 240 MG 24 hr capsule Take 1 capsule (240 mg total) by mouth daily.   Marland Kitchen donepezil (ARICEPT) 5 MG tablet Take 5 mg by mouth at bedtime.   Marland Kitchen losartan (COZAAR) 100 MG tablet Take 100 mg by mouth daily.  04/19/2015: Received from: External Pharmacy  . pantoprazole (PROTONIX) 40 MG tablet TAKE 1 TABLET(40 MG) BY MOUTH DAILY    No facility-administered encounter medications on file as of 08/13/2017.     ALLERGIES:  Allergies  Allergen Reactions  . Oxycodone Hives     PHYSICAL EXAM:  ECOG Performance status: 2 - Symptomatic; requires occasional assistance  Vitals:   08/13/17 1028  BP: (!) 142/63  Pulse: 90  Resp: 18  Temp: 98.3 F (36.8 C)  SpO2: 100%   Filed Weights   08/13/17 1028  Weight: 141 lb (64 kg)     Physical Exam  Constitutional: She is oriented to person, place, and  time.  Frail, elderly female in no acute distress. - Examined with her seated in wheelchair.  HENT:  Head: Normocephalic.  Mouth/Throat: Oropharynx is clear and moist. No oropharyngeal exudate.  Eyes: Conjunctivae are normal. Pupils are equal, round, and reactive to light. No scleral icterus.  Neck: Normal range of motion. Neck supple.  Cardiovascular: Normal rate and regular rhythm.  Pulmonary/Chest: Effort normal and breath sounds normal. No respiratory distress. She has no wheezes. She has no rales.  Abdominal: Soft. Bowel sounds are normal. There is no tenderness.  Musculoskeletal: She exhibits no edema.  Lymphadenopathy:    She has no cervical adenopathy.       Right: No supraclavicular adenopathy present.       Left: No supraclavicular adenopathy present.  Neurological: She is alert and oriented to person, place, and time.  Skin: Skin is warm and dry. No rash noted.  Psychiatric: Mood and affect normal.  Nursing note and vitals reviewed.    LABORATORY DATA:  I have reviewed the labs as listed.  CBC    Component Value Date/Time   WBC 7.9 08/13/2017 0943   RBC 3.71 (L) 08/13/2017 0943   HGB 11.4 (L) 08/13/2017 0943   HCT 36.4 08/13/2017 0943   PLT 181 08/13/2017 0943   MCV 98.1 08/13/2017 0943   MCH 30.7 08/13/2017 0943   MCHC 31.3 08/13/2017 0943   RDW 15.1 08/13/2017 0943   LYMPHSABS 3.7 08/13/2017 0943   MONOABS 0.5 08/13/2017 0943   EOSABS 0.1 08/13/2017 0943   BASOSABS 0.0 08/13/2017 0943   CMP Latest Ref Rng & Units 08/13/2017 04/23/2017 03/25/2017  Glucose 65 - 99 mg/dL 172(H) 175(H) 96  BUN 6 - 20 mg/dL 26(H) 28(H) 24(H)  Creatinine 0.44 - 1.00 mg/dL 1.56(H) 1.77(H) 1.15(H)  Sodium 135 - 145 mmol/L 141 136 140  Potassium 3.5 - 5.1 mmol/L 4.2 4.0 3.8  Chloride 101 - 111 mmol/L 111 109 105  CO2 22 - 32 mmol/L 23 20(L) 28  Calcium 8.9 - 10.3 mg/dL 9.3 8.9 9.2  Total Protein 6.5 - 8.1 g/dL - - -  Total Bilirubin 0.3 - 1.2 mg/dL - - -  Alkaline Phos 38 - 126  U/L - - -  AST 15 - 41 U/L - - -  ALT 14 - 54 U/L - - -    PENDING LABS:    DIAGNOSTIC IMAGING:    PATHOLOGY:    ASSESSMENT & PLAN:   Anemia:  -Likely secondary to chronic kidney disease. Possible component of iron deficiency as well with EGD revealing erosive gastritis and moderate duodenitis.  -Clinically, she feels well with no reported bleeding episodes. She has intermittent fatigue, but this is chronic and largely unchanged.  -Hgb 11.4 g/dL today. Reviewed lab trends and Aranesp administration record.  She has received most Aranesp injections monthly since her last follow-up visit; 05/2017 dose was held per protocol d/t Hgb > 11 g/dL. Given her excellent response to ESA therapy, recommend extending her Aranesp injections to every 6 weeks (instead of every 4 weeks).  Of course, if she experiences worsening symptoms of anemia including fatigue or bleeding episodes, then we can definitely see her sooner and restart more frequent Aranesp injections.  She agreed with this plan. -Change Aranesp injections to every 6 weeks; supportive therapy treatment plan adjusted accordingly.  -Return to cancer center in 4 months for follow-up with labs and subsequent injection.   CKD:  -Stage 3 today with EGFR 34. Historically, her average GFR values have been in the 40s.  BUN/CRE mildly elevated at 26/1.56 today. Reiterated that her kidney disease is likely etiology of her anemia. Will keep monitoring.   Vitamin B12 deficiency: -Continue monthly vitamin B12 injections at home as directed.          Dispo:  -Change lab and Aranesp every 6 weeks. (CBC with diff standing orders Q6wks) -Return to cancer center in 4 months for follow-up with labs and subsequent injection. (CBC with diff & BMP)     All questions were answered to patient's stated satisfaction. Encouraged patient to call with any new concerns or questions before her next visit to the cancer center and we can certain see her  sooner, if needed.    Plan of care discussed with Dr. Talbert Cage, who agrees with the above aforementioned.    Orders placed this encounter:  Orders Placed This Encounter  Procedures  . CBC with Differential/Platelet  . Basic metabolic panel      Mike Craze, NP [place] [phone]

## 2017-08-13 ENCOUNTER — Encounter (HOSPITAL_BASED_OUTPATIENT_CLINIC_OR_DEPARTMENT_OTHER): Payer: Medicare Other | Admitting: Adult Health

## 2017-08-13 ENCOUNTER — Other Ambulatory Visit: Payer: Self-pay

## 2017-08-13 ENCOUNTER — Encounter (HOSPITAL_COMMUNITY): Payer: Self-pay | Admitting: Adult Health

## 2017-08-13 ENCOUNTER — Encounter (HOSPITAL_COMMUNITY): Payer: Medicare Other | Attending: Oncology

## 2017-08-13 ENCOUNTER — Encounter (HOSPITAL_COMMUNITY): Payer: Medicare Other

## 2017-08-13 VITALS — BP 142/63 | HR 90 | Temp 98.3°F | Resp 18 | Ht 65.0 in | Wt 141.0 lb

## 2017-08-13 DIAGNOSIS — F329 Major depressive disorder, single episode, unspecified: Secondary | ICD-10-CM | POA: Insufficient documentation

## 2017-08-13 DIAGNOSIS — Z79899 Other long term (current) drug therapy: Secondary | ICD-10-CM | POA: Insufficient documentation

## 2017-08-13 DIAGNOSIS — E538 Deficiency of other specified B group vitamins: Secondary | ICD-10-CM

## 2017-08-13 DIAGNOSIS — N183 Chronic kidney disease, stage 3 unspecified: Secondary | ICD-10-CM

## 2017-08-13 DIAGNOSIS — D631 Anemia in chronic kidney disease: Secondary | ICD-10-CM | POA: Insufficient documentation

## 2017-08-13 DIAGNOSIS — E785 Hyperlipidemia, unspecified: Secondary | ICD-10-CM | POA: Diagnosis not present

## 2017-08-13 DIAGNOSIS — I471 Supraventricular tachycardia: Secondary | ICD-10-CM | POA: Insufficient documentation

## 2017-08-13 DIAGNOSIS — D649 Anemia, unspecified: Secondary | ICD-10-CM | POA: Diagnosis not present

## 2017-08-13 DIAGNOSIS — D519 Vitamin B12 deficiency anemia, unspecified: Secondary | ICD-10-CM | POA: Diagnosis not present

## 2017-08-13 DIAGNOSIS — I129 Hypertensive chronic kidney disease with stage 1 through stage 4 chronic kidney disease, or unspecified chronic kidney disease: Secondary | ICD-10-CM | POA: Insufficient documentation

## 2017-08-13 DIAGNOSIS — E1122 Type 2 diabetes mellitus with diabetic chronic kidney disease: Secondary | ICD-10-CM | POA: Diagnosis not present

## 2017-08-13 DIAGNOSIS — N189 Chronic kidney disease, unspecified: Secondary | ICD-10-CM

## 2017-08-13 LAB — BASIC METABOLIC PANEL
Anion gap: 7 (ref 5–15)
BUN: 26 mg/dL — ABNORMAL HIGH (ref 6–20)
CO2: 23 mmol/L (ref 22–32)
Calcium: 9.3 mg/dL (ref 8.9–10.3)
Chloride: 111 mmol/L (ref 101–111)
Creatinine, Ser: 1.56 mg/dL — ABNORMAL HIGH (ref 0.44–1.00)
GFR calc Af Amer: 34 mL/min — ABNORMAL LOW (ref >60)
GFR calc non Af Amer: 29 mL/min — ABNORMAL LOW (ref >60)
Glucose, Bld: 172 mg/dL — ABNORMAL HIGH (ref 65–99)
Potassium: 4.2 mmol/L (ref 3.5–5.1)
Sodium: 141 mmol/L (ref 135–145)

## 2017-08-13 LAB — IRON AND TIBC
Iron: 96 ug/dL (ref 28–170)
Saturation Ratios: 43 % — ABNORMAL HIGH (ref 10.4–31.8)
TIBC: 225 ug/dL — ABNORMAL LOW (ref 250–450)
UIBC: 129 ug/dL

## 2017-08-13 LAB — CBC WITH DIFFERENTIAL/PLATELET
Basophils Absolute: 0 10*3/uL (ref 0.0–0.1)
Basophils Relative: 0 %
Eosinophils Absolute: 0.1 10*3/uL (ref 0.0–0.7)
Eosinophils Relative: 1 %
HCT: 36.4 % (ref 36.0–46.0)
Hemoglobin: 11.4 g/dL — ABNORMAL LOW (ref 12.0–15.0)
Lymphocytes Relative: 48 %
Lymphs Abs: 3.7 10*3/uL (ref 0.7–4.0)
MCH: 30.7 pg (ref 26.0–34.0)
MCHC: 31.3 g/dL (ref 30.0–36.0)
MCV: 98.1 fL (ref 78.0–100.0)
Monocytes Absolute: 0.5 10*3/uL (ref 0.1–1.0)
Monocytes Relative: 6 %
Neutro Abs: 3.5 10*3/uL (ref 1.7–7.7)
Neutrophils Relative %: 45 %
Platelets: 181 10*3/uL (ref 150–400)
RBC: 3.71 MIL/uL — ABNORMAL LOW (ref 3.87–5.11)
RDW: 15.1 % (ref 11.5–15.5)
WBC: 7.9 10*3/uL (ref 4.0–10.5)

## 2017-08-13 LAB — FERRITIN: Ferritin: 379 ng/mL — ABNORMAL HIGH (ref 11–307)

## 2017-08-13 NOTE — Progress Notes (Signed)
Hgb 11.4 today. Aranesp injection held today per parameters. Pt seen by Mike Craze NP today

## 2017-08-13 NOTE — Patient Instructions (Signed)
[place] at [facility] Discharge Instructions  RECOMMENDATIONS MADE BY THE CONSULTANT AND ANY TEST RESULTS WILL BE SENT TO YOUR REFERRING PHYSICIAN.  You were seen today by Mike Craze NP. Return in 6 weeks for labs and Aranesp injection. Return in 4 months for labs and follow up.   Thank you for choosing [place] at [facility] to provide your oncology and hematology care.  To afford each patient quality time with our provider, please arrive at least 15 minutes before your scheduled appointment time.    If you have a lab appointment with the [facility] please come in thru the  Main Entrance and check in at the main information desk  You need to re-schedule your appointment should you arrive 10 or more minutes late.  We strive to give you quality time with our providers, and arriving late affects you and other patients whose appointments are after yours.  Also, if you no show three or more times for appointments you may be dismissed from the clinic at the providers discretion.     Again, thank you for choosing [facility].  Our hope is that these requests will decrease the amount of time that you wait before being seen by our physicians.       _____________________________________________________________  Should you have questions after your visit to [facility], please contact our office at [phone] between the hours of 8:30 a.m. and 4:30 p.m.  Voicemails left after 4:30 p.m. will not be returned until the following business day.  For prescription refill requests, have your pharmacy contact our office.       Resources For Cancer Patients and their Caregivers ? American Cancer Society: Can assist with transportation, wigs, general needs, runs Look Good Feel Better.        (618)586-7062 ? Cancer Care: Provides financial assistance, online support groups, medication/co-pay assistance.   1-800-813-HOPE 808-875-1398) ? [facility] Assists [place] cancer patients and their families through emotional , educational and financial support.  579-501-9550 ? [place] DSS Where to apply for food stamps, Medicaid and utility assistance. 919 244 3009 ? RCATS: Transportation to medical appointments. 779 383 0092 ? Social Security Administration: May apply for disability if have a Stage IV cancer. 862-599-5062 782-210-5556 ? [employer], Disability and Transit Services: Assists with nutrition, care and transit needs. [facility] Support Programs: @10RELATIVEDAYS @ > Cancer Support Group  2nd Tuesday of the month 1pm-2pm, Journey Room  > Creative Journey  3rd Tuesday of the month 1130am-1pm, Journey Room  > Look Good Feel Better  1st Wednesday of the month 10am-12 noon, Journey Room (Call [facility] to register [phone])

## 2017-09-08 DIAGNOSIS — N183 Chronic kidney disease, stage 3 (moderate): Secondary | ICD-10-CM | POA: Diagnosis not present

## 2017-09-08 DIAGNOSIS — Z79899 Other long term (current) drug therapy: Secondary | ICD-10-CM | POA: Diagnosis not present

## 2017-09-08 DIAGNOSIS — D649 Anemia, unspecified: Secondary | ICD-10-CM | POA: Diagnosis not present

## 2017-09-08 DIAGNOSIS — I10 Essential (primary) hypertension: Secondary | ICD-10-CM | POA: Diagnosis not present

## 2017-09-15 DIAGNOSIS — N183 Chronic kidney disease, stage 3 (moderate): Secondary | ICD-10-CM | POA: Diagnosis not present

## 2017-09-15 DIAGNOSIS — Z6823 Body mass index (BMI) 23.0-23.9, adult: Secondary | ICD-10-CM | POA: Diagnosis not present

## 2017-09-15 DIAGNOSIS — E119 Type 2 diabetes mellitus without complications: Secondary | ICD-10-CM | POA: Diagnosis not present

## 2017-09-15 DIAGNOSIS — Z23 Encounter for immunization: Secondary | ICD-10-CM | POA: Diagnosis not present

## 2017-09-15 DIAGNOSIS — I10 Essential (primary) hypertension: Secondary | ICD-10-CM | POA: Diagnosis not present

## 2017-09-24 ENCOUNTER — Inpatient Hospital Stay (HOSPITAL_COMMUNITY): Payer: Medicare Other

## 2017-09-24 ENCOUNTER — Inpatient Hospital Stay (HOSPITAL_COMMUNITY): Payer: Medicare Other | Attending: Oncology

## 2017-09-24 DIAGNOSIS — D519 Vitamin B12 deficiency anemia, unspecified: Secondary | ICD-10-CM

## 2017-09-24 DIAGNOSIS — N183 Chronic kidney disease, stage 3 unspecified: Secondary | ICD-10-CM

## 2017-09-24 DIAGNOSIS — I129 Hypertensive chronic kidney disease with stage 1 through stage 4 chronic kidney disease, or unspecified chronic kidney disease: Secondary | ICD-10-CM | POA: Insufficient documentation

## 2017-09-24 DIAGNOSIS — D631 Anemia in chronic kidney disease: Secondary | ICD-10-CM | POA: Insufficient documentation

## 2017-09-24 LAB — CBC WITH DIFFERENTIAL/PLATELET
Basophils Absolute: 0 10*3/uL (ref 0.0–0.1)
Basophils Relative: 0 %
Eosinophils Absolute: 0.1 10*3/uL (ref 0.0–0.7)
Eosinophils Relative: 1 %
HCT: 35 % — ABNORMAL LOW (ref 36.0–46.0)
Hemoglobin: 11.6 g/dL — ABNORMAL LOW (ref 12.0–15.0)
Lymphocytes Relative: 52 %
Lymphs Abs: 4.3 10*3/uL — ABNORMAL HIGH (ref 0.7–4.0)
MCH: 32.6 pg (ref 26.0–34.0)
MCHC: 33.1 g/dL (ref 30.0–36.0)
MCV: 98.3 fL (ref 78.0–100.0)
Monocytes Absolute: 0.6 10*3/uL (ref 0.1–1.0)
Monocytes Relative: 7 %
Neutro Abs: 3.3 10*3/uL (ref 1.7–7.7)
Neutrophils Relative %: 40 %
Platelets: 216 10*3/uL (ref 150–400)
RBC: 3.56 MIL/uL — ABNORMAL LOW (ref 3.87–5.11)
RDW: 14.1 % (ref 11.5–15.5)
WBC: 8.3 10*3/uL (ref 4.0–10.5)

## 2017-09-24 NOTE — Progress Notes (Signed)
Patient's hemoglobin today is 11.6 which does not meet parameters for Aranesp injection.  Patient will follow up as scheduled.

## 2017-10-02 ENCOUNTER — Other Ambulatory Visit: Payer: Self-pay | Admitting: Gastroenterology

## 2017-10-02 DIAGNOSIS — K219 Gastro-esophageal reflux disease without esophagitis: Secondary | ICD-10-CM

## 2017-10-04 DIAGNOSIS — E119 Type 2 diabetes mellitus without complications: Secondary | ICD-10-CM | POA: Diagnosis not present

## 2017-11-05 ENCOUNTER — Encounter (HOSPITAL_COMMUNITY): Payer: Self-pay

## 2017-11-05 ENCOUNTER — Ambulatory Visit (HOSPITAL_COMMUNITY): Payer: Self-pay

## 2017-11-05 ENCOUNTER — Other Ambulatory Visit: Payer: Self-pay

## 2017-11-05 ENCOUNTER — Other Ambulatory Visit (HOSPITAL_COMMUNITY): Payer: Self-pay

## 2017-11-05 ENCOUNTER — Inpatient Hospital Stay (HOSPITAL_COMMUNITY): Payer: Medicare Other

## 2017-11-05 ENCOUNTER — Inpatient Hospital Stay (HOSPITAL_COMMUNITY): Payer: Medicare Other | Attending: Adult Health

## 2017-11-05 VITALS — BP 157/62 | HR 72 | Temp 98.2°F | Resp 16

## 2017-11-05 DIAGNOSIS — N183 Chronic kidney disease, stage 3 unspecified: Secondary | ICD-10-CM

## 2017-11-05 DIAGNOSIS — Z79899 Other long term (current) drug therapy: Secondary | ICD-10-CM | POA: Diagnosis not present

## 2017-11-05 DIAGNOSIS — D519 Vitamin B12 deficiency anemia, unspecified: Secondary | ICD-10-CM

## 2017-11-05 DIAGNOSIS — I129 Hypertensive chronic kidney disease with stage 1 through stage 4 chronic kidney disease, or unspecified chronic kidney disease: Secondary | ICD-10-CM | POA: Diagnosis not present

## 2017-11-05 DIAGNOSIS — D631 Anemia in chronic kidney disease: Secondary | ICD-10-CM | POA: Diagnosis not present

## 2017-11-05 LAB — CBC WITH DIFFERENTIAL/PLATELET
Basophils Absolute: 0 10*3/uL (ref 0.0–0.1)
Basophils Relative: 0 %
Eosinophils Absolute: 0.1 10*3/uL (ref 0.0–0.7)
Eosinophils Relative: 1 %
HCT: 34.6 % — ABNORMAL LOW (ref 36.0–46.0)
Hemoglobin: 10.9 g/dL — ABNORMAL LOW (ref 12.0–15.0)
Lymphocytes Relative: 45 %
Lymphs Abs: 3.3 10*3/uL (ref 0.7–4.0)
MCH: 29.8 pg (ref 26.0–34.0)
MCHC: 31.5 g/dL (ref 30.0–36.0)
MCV: 94.5 fL (ref 78.0–100.0)
Monocytes Absolute: 0.5 10*3/uL (ref 0.1–1.0)
Monocytes Relative: 8 %
Neutro Abs: 3.3 10*3/uL (ref 1.7–7.7)
Neutrophils Relative %: 46 %
Platelets: 213 10*3/uL (ref 150–400)
RBC: 3.66 MIL/uL — ABNORMAL LOW (ref 3.87–5.11)
RDW: 14.4 % (ref 11.5–15.5)
WBC: 7.2 10*3/uL (ref 4.0–10.5)

## 2017-11-05 MED ORDER — DARBEPOETIN ALFA 60 MCG/0.3ML IJ SOSY
50.0000 ug | PREFILLED_SYRINGE | Freq: Once | INTRAMUSCULAR | Status: AC
  Administered 2017-11-05: 50 ug via SUBCUTANEOUS
  Filled 2017-11-05: qty 0.3

## 2017-11-05 NOTE — Progress Notes (Signed)
Laura Davenport presents today for injection per MD orders. Aranesp 50 mcg administered SQ in left Abdomen. Administration without incident. Patient tolerated well.  Treatment given per orders. Patient tolerated it well without problems. Vitals stable and discharged home from clinic via wheelchair. Follow up as scheduled.

## 2017-11-05 NOTE — Patient Instructions (Signed)
[facility] at [facility] Discharge Instructions  Aranesp given today.  Follow up as scheduled.   Thank you for choosing  Can[facility] Center at [facility] to provide your oncology and hematology care.  To afford each patient quality time with our provider, please arrive at least 15 minutes before your scheduled appointment time.   If you have a lab appointment with the Cancer Center please come in thru the  Main Entrance and check in at the main information desk  You need to re-schedule your appointment should you arrive 10 or more minutes late.  We strive to give you quality time with our providers, and arriving late affects you and other patients whose appointments are after yours.  Also, if you no show three or more times for appointments you may be dismissed from the clinic at the providers discretion.     Again, thank you for choosing [facility].  Our hope is that these requests will decrease the amount of time that you wait before being seen by our physicians.       _____________________________________________________________  Should you have questions after your visit to [facility], please contact our office at [phone] between the hours of 8:30 a.m. and 4:30 p.m.  Voicemails left after 4:30 p.m. will not be returned until the following business day.  For prescription refill requests, have your pharmacy contact our office.       Resources For Cancer Patients and their Caregivers ? American Cancer Society: Can assist with transportation, wigs, general needs, runs Look Good Feel Better.        1-888-227-6333 ? Cancer Care: Provides financial assistance, online support groups, medication/co-pay assistance.  1-800-813-HOPE (4673) ? [facility] Assists [place] cancer patients and their families through emotional , educational and financial support.  336-427-4357 ? [place]  DSS Where to apply for food stamps, Medicaid and utility assistance. 336-342-1394 ? RCATS: Transportation to medical appointments. 336-347-2287 ? Social Security Administration: May apply for disability if have a Stage IV cancer. 336-342-7796 1-800-772-1213 ? [place] Aging, Disability and Transit Services: Assists with nutrition, care and transit needs. 336-349-2343  Cancer Center Support Programs:   > Cancer Support Group  2nd Tuesday of the month 1pm-2pm, Journey Room   > Creative Journey  3rd Tuesday of the month 1130am-1pm, Journey Room    

## 2017-12-17 ENCOUNTER — Inpatient Hospital Stay (HOSPITAL_COMMUNITY): Payer: Medicare Other | Attending: Hematology

## 2017-12-17 ENCOUNTER — Inpatient Hospital Stay (HOSPITAL_BASED_OUTPATIENT_CLINIC_OR_DEPARTMENT_OTHER): Payer: Medicare Other | Admitting: Adult Health

## 2017-12-17 ENCOUNTER — Inpatient Hospital Stay (HOSPITAL_COMMUNITY): Payer: Medicare Other

## 2017-12-17 ENCOUNTER — Other Ambulatory Visit: Payer: Self-pay

## 2017-12-17 ENCOUNTER — Encounter (HOSPITAL_COMMUNITY): Payer: Self-pay | Admitting: Adult Health

## 2017-12-17 VITALS — BP 146/78 | HR 103 | Temp 98.7°F | Resp 20 | Wt 156.6 lb

## 2017-12-17 DIAGNOSIS — Z79899 Other long term (current) drug therapy: Secondary | ICD-10-CM | POA: Diagnosis not present

## 2017-12-17 DIAGNOSIS — E538 Deficiency of other specified B group vitamins: Secondary | ICD-10-CM | POA: Diagnosis not present

## 2017-12-17 DIAGNOSIS — I129 Hypertensive chronic kidney disease with stage 1 through stage 4 chronic kidney disease, or unspecified chronic kidney disease: Secondary | ICD-10-CM | POA: Insufficient documentation

## 2017-12-17 DIAGNOSIS — E785 Hyperlipidemia, unspecified: Secondary | ICD-10-CM | POA: Diagnosis not present

## 2017-12-17 DIAGNOSIS — N183 Chronic kidney disease, stage 3 unspecified: Secondary | ICD-10-CM

## 2017-12-17 DIAGNOSIS — E119 Type 2 diabetes mellitus without complications: Secondary | ICD-10-CM

## 2017-12-17 DIAGNOSIS — D631 Anemia in chronic kidney disease: Secondary | ICD-10-CM | POA: Insufficient documentation

## 2017-12-17 DIAGNOSIS — N189 Chronic kidney disease, unspecified: Principal | ICD-10-CM

## 2017-12-17 DIAGNOSIS — F329 Major depressive disorder, single episode, unspecified: Secondary | ICD-10-CM | POA: Insufficient documentation

## 2017-12-17 DIAGNOSIS — D519 Vitamin B12 deficiency anemia, unspecified: Secondary | ICD-10-CM

## 2017-12-17 LAB — CBC WITH DIFFERENTIAL/PLATELET
Basophils Absolute: 0 10*3/uL (ref 0.0–0.1)
Basophils Relative: 0 %
Eosinophils Absolute: 0.1 10*3/uL (ref 0.0–0.7)
Eosinophils Relative: 2 %
HCT: 34.3 % — ABNORMAL LOW (ref 36.0–46.0)
Hemoglobin: 11.2 g/dL — ABNORMAL LOW (ref 12.0–15.0)
Lymphocytes Relative: 45 %
Lymphs Abs: 3 10*3/uL (ref 0.7–4.0)
MCH: 31.5 pg (ref 26.0–34.0)
MCHC: 32.7 g/dL (ref 30.0–36.0)
MCV: 96.6 fL (ref 78.0–100.0)
Monocytes Absolute: 0.4 10*3/uL (ref 0.1–1.0)
Monocytes Relative: 6 %
Neutro Abs: 3.1 10*3/uL (ref 1.7–7.7)
Neutrophils Relative %: 47 %
Platelets: 193 10*3/uL (ref 150–400)
RBC: 3.55 MIL/uL — ABNORMAL LOW (ref 3.87–5.11)
RDW: 14.4 % (ref 11.5–15.5)
WBC: 6.6 10*3/uL (ref 4.0–10.5)

## 2017-12-17 NOTE — Patient Instructions (Signed)
[place] at [facility] Discharge Instructions  Today you saw Laura Davenport    Thank you for choosing [place] at [facility] to provide your oncology and hematology care.  To afford each patient quality time with our provider, please arrive at least 15 minutes before your scheduled appointment time.   If you have a lab appointment with the [place] please come in thru the  Main Entrance and check in at the main information desk  You need to re-schedule your appointment should you arrive 10 or more minutes late.  We strive to give you quality time with our providers, and arriving late affects you and other patients whose appointments are after yours.  Also, if you no show three or more times for appointments you may be dismissed from the clinic at the providers discretion.     Again, thank you for choosing [facility].  Our hope is that these requests will decrease the amount of time that you wait before being seen by our physicians.       _____________________________________________________________  Should you have questions after your visit to [facility], please contact our office at [phone] between the hours of 8:30 a.m. and 4:30 p.m.  Voicemails left after 4:30 p.m. will not be returned until the following business day.  For prescription refill requests, have your pharmacy contact our office.       Resources For Cancer Patients and their Caregivers ? American Cancer Society: Can assist with transportation, wigs, general needs, runs Look Good Feel Better.        6064064044 ? Cancer Care: Provides financial assistance, online support groups, medication/co-pay assistance.  1-800-813-HOPE 254-065-4147) ? [facility] Assists [place] cancer patients and their families through emotional , educational and financial support.  407-565-2617 ? [place] DSS Where to apply for food  stamps, Medicaid and utility assistance. (815)846-1302 ? RCATS: Transportation to medical appointments. 431-239-9515 ? Social Security Administration: May apply for disability if have a Stage IV cancer. (417) 659-9643 7785590912 ? [employer], Disability and Transit Services: Assists with nutrition, care and transit needs. [facility] Support Programs:   > Cancer Support Group  2nd Tuesday of the month 1pm-2pm, Journey Room   > Creative Journey  3rd Tuesday of the month 1130am-1pm, Journey Room

## 2017-12-17 NOTE — Progress Notes (Signed)
Laura Davenport, Laura Davenport [id number]   CLINIC:  Medical Oncology/Hematology  PCP:  [name], MD [address] [place] [address] [phone]   REASON FOR VISIT:  Follow-up for Anemia AND vitamin B12 deficiency  CURRENT THERAPY: Aranesp 50 mcg every 6 weeks AND vitamin B12 injections monthly (administered at home).     HISTORY OF PRESENT ILLNESS:  (From [name], PA-C's last note on 12/11/16)       INTERVAL HISTORY:  [name] 84 y.o. female returns for routine follow-up for anemia and vitamin B12 deficiency.   Here today with her daughter. Seen in a wheelchair.   Overall, she tells me she has been "feeling good."  Appetite 100%; energy levels 50-75%.  Denies any pain.  She tells me about some intermittent (L) LE edema, which "comes and goes. It's not too bad today!"  Denies any calf pain or skin redness.  She spends much of her days seated; she tries to do some seated exercises with her legs and arms, when she's able. She walks very short distances with her walker.    Denies any frank bleeding episodes including blood in her stools, dark/tarry stools, hematuria, vaginal bleeding, hemoptysis, hematemesis, nosebleeds, or gingival bleeding.  Denies chest pain or shortness of breath.  Bowels are moving well.    She lives with one of her daughters, who is here with her today.     REVIEW OF SYSTEMS:  Review of Systems  Per HPI. Otherwise, 12 point ROS completed and negative except as stable above.    PAST MEDICAL/SURGICAL HISTORY:  Past Medical History:  Diagnosis Date  . Anemia in chronic kidney disease 01/20/2016  . B12 deficiency anemia 10/25/2015  . Chronic renal disease, stage 3, moderately decreased glomerular filtration rate (GFR) between 30-59 mL/min/1.73 square meter ([place]) 01/07/2016  . Depression   . Essential hypertension   . GI bleed    Severe gastritis and duodenal ulcers by EGD May 2016   . HLD  (hyperlipidemia)   . PSVT (paroxysmal supraventricular tachycardia) ([place])   . Septic arthritis (HCC)    MRSA, left knee   . Type 2 diabetes mellitus ([place])    Past Surgical History:  Procedure Laterality Date  . CHOLECYSTECTOMY N/A 11/04/2012   Procedure: LAPAROSCOPIC CHOLECYSTECTOMY;  Surgeon: Jamesetta So, MD;  Location: [facility];  Service: General;  Laterality: N/A;  . ESOPHAGOGASTRODUODENOSCOPY N/A 01/06/2015   SLF: mild esophagitis  . I&D EXTREMITY Left 12/24/2014   Procedure: IRRIGATION AND DEBRIDEMENT EXTREMITY AND ARTHROSCOPY KNEE;  Surgeon: Renette Butters, MD;  Location: [facility];  Service: Orthopedics;  Laterality: Left;  . KNEE SURGERY    . SAVORY DILATION  01/06/2015   Procedure: SAVORY DILATION;  Surgeon: Danie Binder, MD;  Location: [facility];  Service: Endoscopy;;     SOCIAL HISTORY:  Social History   Socioeconomic History  . Marital status: Widowed    Spouse name: Not on file  . Number of children: 38  . Years of education: Not on file  . Highest education level: Not on file  Occupational History  . Occupation: retired; [employer]  . Financial resource strain: Not on file  . Food insecurity:    Worry: Not on file    Inability: Not on file  . Transportation needs:    Medical: Not on file    Non-medical: Not on file  Tobacco Use  . Smoking status: Never Smoker  .  Smokeless tobacco: Never Used  . Tobacco comment: Never smoked  Substance and Sexual Activity  . Alcohol use: No    Alcohol/week: 0.0 oz  . Drug use: No  . Sexual activity: Not on file  Lifestyle  . Physical activity:    Days per week: Not on file    Minutes per session: Not on file  . Stress: Not on file  Relationships  . Social connections:    Talks on phone: Not on file    Gets together: Not on file    Attends religious service: Not on file    Active member of club or organization: Not on file    Attends meetings of clubs or organizations: Not on file    Relationship  status: Not on file  . Intimate partner violence:    Fear of current or ex partner: Not on file    Emotionally abused: Not on file    Physically abused: Not on file    Forced sexual activity: Not on file  Other Topics Concern  . Not on file  Social History Narrative   Lives alone   3 daughters & granddaughter nearby   Lost 1 son-strep          FAMILY HISTORY:  Family History  Family history unknown: Yes    CURRENT MEDICATIONS:  Outpatient Encounter Medications as of 12/17/2017  Medication Sig Note  . acetaminophen (TYLENOL) 325 MG tablet Take 650 mg by mouth every 6 (six) hours as needed for mild pain or moderate pain.    . cyanocobalamin (,VITAMIN B-12,) 1000 MCG/ML injection ADMINISTER 1 ML(1000 MCG) UNDER THE SKIN EVERY 30 DAYS   . diltiazem (CARDIZEM CD) 240 MG 24 hr capsule Take 1 capsule (240 mg total) by mouth daily.   Marland Kitchen donepezil (ARICEPT) 5 MG tablet Take 5 mg by mouth at bedtime.   Marland Kitchen losartan (COZAAR) 100 MG tablet Take 100 mg by mouth daily.  04/19/2015: Received from: External Pharmacy  . pantoprazole (PROTONIX) 40 MG tablet TAKE 1 TABLET(40 MG) BY MOUTH DAILY    No facility-administered encounter medications on file as of 12/17/2017.     ALLERGIES:  Allergies  Allergen Reactions  . Oxycodone Hives     PHYSICAL EXAM:  ECOG Performance status: 2 - Symptomatic; requires assistance  Vitals:   12/17/17 1032  BP: (!) 146/78  Pulse: (!) 103  Resp: 20  Temp: 98.7 F (37.1 C)  SpO2: 100%   Filed Weights   12/17/17 1123  Weight: 156 lb 9.6 oz (71 kg)     Physical Exam  Constitutional: She is oriented to person, place, and time.  Frail elderly female in no acute distress -Exam done with patient seated in wheelchair   HENT:  Head: Normocephalic.  Mouth/Throat: Oropharynx is clear and moist. No oropharyngeal exudate.  Eyes: Pupils are equal, round, and reactive to light. Conjunctivae are normal. No scleral icterus.  Neck: Normal range of motion. Neck  supple.  Cardiovascular: Normal rate and regular rhythm.  Pulmonary/Chest: Effort normal and breath sounds normal. No respiratory distress.  Abdominal: Soft. Bowel sounds are normal. There is no tenderness.  Musculoskeletal: She exhibits edema (trace ankle edema bilat).  Lymphadenopathy:    She has no cervical adenopathy.  Neurological: She is alert and oriented to person, place, and time. No cranial nerve deficit.  Skin: Skin is warm and dry. No rash noted.  Psychiatric: Mood and affect normal.  Nursing note and vitals reviewed.    LABORATORY DATA:  I  have reviewed the labs as listed.  CBC    Component Value Date/Time   WBC 6.6 12/17/2017 1011   RBC 3.55 (L) 12/17/2017 1011   HGB 11.2 (L) 12/17/2017 1011   HCT 34.3 (L) 12/17/2017 1011   PLT 193 12/17/2017 1011   MCV 96.6 12/17/2017 1011   MCH 31.5 12/17/2017 1011   MCHC 32.7 12/17/2017 1011   RDW 14.4 12/17/2017 1011   LYMPHSABS 3.0 12/17/2017 1011   MONOABS 0.4 12/17/2017 1011   EOSABS 0.1 12/17/2017 1011   BASOSABS 0.0 12/17/2017 1011   CMP Latest Ref Rng & Units 08/13/2017 04/23/2017 03/25/2017  Glucose 65 - 99 mg/dL 172(H) 175(H) 96  BUN 6 - 20 mg/dL 26(H) 28(H) 24(H)  Creatinine 0.44 - 1.00 mg/dL 1.56(H) 1.77(H) 1.15(H)  Sodium 135 - 145 mmol/L 141 136 140  Potassium 3.5 - 5.1 mmol/L 4.2 4.0 3.8  Chloride 101 - 111 mmol/L 111 109 105  CO2 22 - 32 mmol/L 23 20(L) 28  Calcium 8.9 - 10.3 mg/dL 9.3 8.9 9.2  Total Protein 6.5 - 8.1 g/dL - - -  Total Bilirubin 0.3 - 1.2 mg/dL - - -  Alkaline Phos 38 - 126 U/L - - -  AST 15 - 41 U/L - - -  ALT 14 - 54 U/L - - -    PENDING LABS:    DIAGNOSTIC IMAGING:    PATHOLOGY:    ASSESSMENT & PLAN:   Anemia:  -Likely secondary to chronic kidney disease. Possible component of iron deficiency as well with EGD revealing erosive gastritis and moderate duodenitis.  -Clinically, she feels well with no reported bleeding episodes. She has intermittent fatigue, but this is  chronic and largely unchanged.  -Last Aranesp injections were given on 07/16/17, then 11/05/17.  At her last visit, we changed her Aranesp to every 6 weeks given stability of Hgb >11 g/L.  -Labs reviewed today. Hgb 11.2 g/dL today. Aranesp will be held today per protocol.   -Given stability of her labs, will change her Aranesp to every 8 weeks.  Treatment plan changed accordingly.   -Return to cancer center in 4 months for follow-up and next Aranesp injection. Instructions given for her to call our office if she has progressive fatigue, then we can bring her in sooner for blood work and injection if needed.     Vitamin B12 deficiency: -Continue monthly vitamin B12 injections at home as directed.          Dispo:  -Change lab and Aranesp every 8 weeks.  -Return to cancer center in 4 months for follow-up with labs and subsequent injection. (CBC with diff & BMP)     All questions were answered to patient's stated satisfaction. Encouraged patient to call with any new concerns or questions before her next visit to the cancer center and we can certain see her sooner, if needed.      Orders placed this encounter:  Orders Placed This Encounter  Procedures  . Basic metabolic panel  . CBC with Differential/Platelet      Mike Craze, NP [place] [phone]

## 2017-12-17 NOTE — Progress Notes (Signed)
Hgb 11.2 today Aranesp injection held per parameters

## 2017-12-17 NOTE — Progress Notes (Signed)
Unable to weigh patient today. Patient in wheelchair and can not stand on the scale.

## 2017-12-22 DIAGNOSIS — Z79899 Other long term (current) drug therapy: Secondary | ICD-10-CM | POA: Diagnosis not present

## 2017-12-22 DIAGNOSIS — N183 Chronic kidney disease, stage 3 (moderate): Secondary | ICD-10-CM | POA: Diagnosis not present

## 2017-12-22 DIAGNOSIS — I10 Essential (primary) hypertension: Secondary | ICD-10-CM | POA: Diagnosis not present

## 2017-12-22 DIAGNOSIS — G309 Alzheimer's disease, unspecified: Secondary | ICD-10-CM | POA: Diagnosis not present

## 2017-12-22 DIAGNOSIS — E119 Type 2 diabetes mellitus without complications: Secondary | ICD-10-CM | POA: Diagnosis not present

## 2017-12-29 DIAGNOSIS — G309 Alzheimer's disease, unspecified: Secondary | ICD-10-CM | POA: Diagnosis not present

## 2017-12-29 DIAGNOSIS — L239 Allergic contact dermatitis, unspecified cause: Secondary | ICD-10-CM | POA: Diagnosis not present

## 2017-12-29 DIAGNOSIS — N183 Chronic kidney disease, stage 3 (moderate): Secondary | ICD-10-CM | POA: Diagnosis not present

## 2018-01-03 DIAGNOSIS — E119 Type 2 diabetes mellitus without complications: Secondary | ICD-10-CM | POA: Diagnosis not present

## 2018-02-09 ENCOUNTER — Other Ambulatory Visit (HOSPITAL_COMMUNITY): Payer: Self-pay

## 2018-02-09 DIAGNOSIS — D631 Anemia in chronic kidney disease: Secondary | ICD-10-CM

## 2018-02-09 DIAGNOSIS — D519 Vitamin B12 deficiency anemia, unspecified: Secondary | ICD-10-CM

## 2018-02-09 DIAGNOSIS — N189 Chronic kidney disease, unspecified: Principal | ICD-10-CM

## 2018-02-09 DIAGNOSIS — N183 Chronic kidney disease, stage 3 unspecified: Secondary | ICD-10-CM

## 2018-02-11 ENCOUNTER — Inpatient Hospital Stay (HOSPITAL_COMMUNITY): Payer: Medicare Other | Attending: Hematology

## 2018-02-11 ENCOUNTER — Inpatient Hospital Stay (HOSPITAL_COMMUNITY): Payer: Medicare Other

## 2018-02-11 DIAGNOSIS — I129 Hypertensive chronic kidney disease with stage 1 through stage 4 chronic kidney disease, or unspecified chronic kidney disease: Secondary | ICD-10-CM | POA: Insufficient documentation

## 2018-02-11 DIAGNOSIS — N183 Chronic kidney disease, stage 3 unspecified: Secondary | ICD-10-CM

## 2018-02-11 DIAGNOSIS — D631 Anemia in chronic kidney disease: Secondary | ICD-10-CM | POA: Insufficient documentation

## 2018-02-11 DIAGNOSIS — N189 Chronic kidney disease, unspecified: Secondary | ICD-10-CM

## 2018-02-11 DIAGNOSIS — D519 Vitamin B12 deficiency anemia, unspecified: Secondary | ICD-10-CM

## 2018-02-11 LAB — CBC WITH DIFFERENTIAL/PLATELET
Basophils Absolute: 0 10*3/uL (ref 0.0–0.1)
Basophils Relative: 0 %
Eosinophils Absolute: 0.1 10*3/uL (ref 0.0–0.7)
Eosinophils Relative: 2 %
HCT: 34.5 % — ABNORMAL LOW (ref 36.0–46.0)
Hemoglobin: 11.4 g/dL — ABNORMAL LOW (ref 12.0–15.0)
Lymphocytes Relative: 44 %
Lymphs Abs: 3.5 10*3/uL (ref 0.7–4.0)
MCH: 31.8 pg (ref 26.0–34.0)
MCHC: 33 g/dL (ref 30.0–36.0)
MCV: 96.4 fL (ref 78.0–100.0)
Monocytes Absolute: 0.6 10*3/uL (ref 0.1–1.0)
Monocytes Relative: 7 %
Neutro Abs: 3.8 10*3/uL (ref 1.7–7.7)
Neutrophils Relative %: 47 %
Platelets: 261 10*3/uL (ref 150–400)
RBC: 3.58 MIL/uL — ABNORMAL LOW (ref 3.87–5.11)
RDW: 13.8 % (ref 11.5–15.5)
WBC: 8 10*3/uL (ref 4.0–10.5)

## 2018-02-11 NOTE — Progress Notes (Signed)
Aranesp held today for hgb of 11.4. Pt and family notified of lab results.

## 2018-04-04 DIAGNOSIS — E119 Type 2 diabetes mellitus without complications: Secondary | ICD-10-CM | POA: Diagnosis not present

## 2018-04-07 ENCOUNTER — Other Ambulatory Visit (HOSPITAL_COMMUNITY): Payer: Self-pay | Admitting: *Deleted

## 2018-04-07 DIAGNOSIS — N189 Chronic kidney disease, unspecified: Principal | ICD-10-CM

## 2018-04-07 DIAGNOSIS — D631 Anemia in chronic kidney disease: Secondary | ICD-10-CM

## 2018-04-08 ENCOUNTER — Ambulatory Visit (HOSPITAL_COMMUNITY): Payer: Self-pay | Admitting: Internal Medicine

## 2018-04-08 ENCOUNTER — Inpatient Hospital Stay (HOSPITAL_COMMUNITY): Payer: Medicare Other | Attending: Hematology

## 2018-04-08 ENCOUNTER — Inpatient Hospital Stay (HOSPITAL_COMMUNITY): Payer: Medicare Other

## 2018-04-08 VITALS — BP 133/52 | HR 69 | Temp 97.8°F | Resp 16

## 2018-04-08 DIAGNOSIS — N189 Chronic kidney disease, unspecified: Secondary | ICD-10-CM

## 2018-04-08 DIAGNOSIS — I129 Hypertensive chronic kidney disease with stage 1 through stage 4 chronic kidney disease, or unspecified chronic kidney disease: Secondary | ICD-10-CM | POA: Insufficient documentation

## 2018-04-08 DIAGNOSIS — N183 Chronic kidney disease, stage 3 unspecified: Secondary | ICD-10-CM

## 2018-04-08 DIAGNOSIS — D631 Anemia in chronic kidney disease: Secondary | ICD-10-CM

## 2018-04-08 DIAGNOSIS — Z79899 Other long term (current) drug therapy: Secondary | ICD-10-CM | POA: Diagnosis not present

## 2018-04-08 LAB — CBC WITH DIFFERENTIAL/PLATELET
Basophils Absolute: 0 10*3/uL (ref 0.0–0.1)
Basophils Relative: 0 %
Eosinophils Absolute: 0.1 10*3/uL (ref 0.0–0.7)
Eosinophils Relative: 2 %
HCT: 31.7 % — ABNORMAL LOW (ref 36.0–46.0)
Hemoglobin: 10.4 g/dL — ABNORMAL LOW (ref 12.0–15.0)
Lymphocytes Relative: 44 %
Lymphs Abs: 3.1 10*3/uL (ref 0.7–4.0)
MCH: 31.6 pg (ref 26.0–34.0)
MCHC: 32.8 g/dL (ref 30.0–36.0)
MCV: 96.4 fL (ref 78.0–100.0)
Monocytes Absolute: 0.5 10*3/uL (ref 0.1–1.0)
Monocytes Relative: 7 %
Neutro Abs: 3.3 10*3/uL (ref 1.7–7.7)
Neutrophils Relative %: 47 %
Platelets: 216 10*3/uL (ref 150–400)
RBC: 3.29 MIL/uL — ABNORMAL LOW (ref 3.87–5.11)
RDW: 14.6 % (ref 11.5–15.5)
WBC: 7 10*3/uL (ref 4.0–10.5)

## 2018-04-08 LAB — COMPREHENSIVE METABOLIC PANEL
ALT: 9 U/L (ref 0–44)
AST: 17 U/L (ref 15–41)
Albumin: 3.7 g/dL (ref 3.5–5.0)
Alkaline Phosphatase: 75 U/L (ref 38–126)
Anion gap: 5 (ref 5–15)
BUN: 22 mg/dL (ref 8–23)
CO2: 22 mmol/L (ref 22–32)
Calcium: 9 mg/dL (ref 8.9–10.3)
Chloride: 114 mmol/L — ABNORMAL HIGH (ref 98–111)
Creatinine, Ser: 1.69 mg/dL — ABNORMAL HIGH (ref 0.44–1.00)
GFR calc Af Amer: 31 mL/min — ABNORMAL LOW (ref >60)
GFR calc non Af Amer: 26 mL/min — ABNORMAL LOW (ref >60)
Glucose, Bld: 128 mg/dL — ABNORMAL HIGH (ref 70–99)
Potassium: 4.4 mmol/L (ref 3.5–5.1)
Sodium: 141 mmol/L (ref 135–145)
Total Bilirubin: 0.8 mg/dL (ref 0.3–1.2)
Total Protein: 7.5 g/dL (ref 6.5–8.1)

## 2018-04-08 LAB — LACTATE DEHYDROGENASE: LDH: 158 U/L (ref 98–192)

## 2018-04-08 MED ORDER — DARBEPOETIN ALFA 60 MCG/0.3ML IJ SOSY
PREFILLED_SYRINGE | INTRAMUSCULAR | Status: AC
  Filled 2018-04-08: qty 0.3

## 2018-04-08 MED ORDER — DARBEPOETIN ALFA 60 MCG/0.3ML IJ SOSY
50.0000 ug | PREFILLED_SYRINGE | Freq: Once | INTRAMUSCULAR | Status: AC
  Administered 2018-04-08: 50 ug via SUBCUTANEOUS

## 2018-04-10 ENCOUNTER — Encounter (HOSPITAL_COMMUNITY): Payer: Self-pay

## 2018-04-10 ENCOUNTER — Other Ambulatory Visit: Payer: Self-pay

## 2018-04-10 NOTE — Patient Instructions (Signed)
[facility] at [facility] Discharge Instructions  Aranesp given today.  Follow up as scheduled.   Thank you for choosing [facility] at [facility] to provide your oncology and hematology care.  To afford each patient quality time with our provider, please arrive at least 15 minutes before your scheduled appointment time.   If you have a lab appointment with the Cancer Center please come in thru the  Main Entrance and check in at the main information desk  You need to re-schedule your appointment should you arrive 10 or more minutes late.  We strive to give you quality time with our providers, and arriving late affects you and other patients whose appointments are after yours.  Also, if you no show three or more times for appointments you may be dismissed from the clinic at the providers discretion.     Again, thank you for choosing [facility].  Our hope is that these requests will decrease the amount of time that you wait before being seen by our physicians.       _____________________________________________________________  Should you have questions after your visit to [facility], please contact our office at [phone] between the hours of 8:00 a.m. and 4:30 p.m.  Voicemails left after 4:00 p.m. will not be returned until the following business day.  For prescription refill requests, have your pharmacy contact our office and allow 72 hours.    Cancer Center Support Programs:   > Cancer Support Group  2nd Tuesday of the month 1pm-2pm, Journey Room   

## 2018-04-10 NOTE — Progress Notes (Signed)
Aranesp given per ordersPatient tolerated it well without problems. Vitals stable and discharged home from clinic via wheelchair Follow up as scheduled.

## 2018-04-21 DIAGNOSIS — G309 Alzheimer's disease, unspecified: Secondary | ICD-10-CM | POA: Diagnosis not present

## 2018-04-21 DIAGNOSIS — I10 Essential (primary) hypertension: Secondary | ICD-10-CM | POA: Diagnosis not present

## 2018-04-21 DIAGNOSIS — N183 Chronic kidney disease, stage 3 (moderate): Secondary | ICD-10-CM | POA: Diagnosis not present

## 2018-04-28 DIAGNOSIS — I10 Essential (primary) hypertension: Secondary | ICD-10-CM | POA: Diagnosis not present

## 2018-04-28 DIAGNOSIS — N183 Chronic kidney disease, stage 3 (moderate): Secondary | ICD-10-CM | POA: Diagnosis not present

## 2018-04-28 DIAGNOSIS — G309 Alzheimer's disease, unspecified: Secondary | ICD-10-CM | POA: Diagnosis not present

## 2018-06-03 ENCOUNTER — Inpatient Hospital Stay (HOSPITAL_COMMUNITY): Payer: Medicare Other | Attending: Hematology

## 2018-06-03 ENCOUNTER — Ambulatory Visit (HOSPITAL_COMMUNITY): Payer: Self-pay | Admitting: Internal Medicine

## 2018-06-03 ENCOUNTER — Other Ambulatory Visit: Payer: Self-pay

## 2018-06-03 ENCOUNTER — Inpatient Hospital Stay (HOSPITAL_COMMUNITY): Payer: Medicare Other

## 2018-06-03 ENCOUNTER — Encounter (HOSPITAL_COMMUNITY): Payer: Self-pay

## 2018-06-03 VITALS — BP 118/60 | HR 82 | Temp 96.4°F | Resp 17

## 2018-06-03 DIAGNOSIS — D631 Anemia in chronic kidney disease: Secondary | ICD-10-CM | POA: Diagnosis not present

## 2018-06-03 DIAGNOSIS — N183 Chronic kidney disease, stage 3 unspecified: Secondary | ICD-10-CM

## 2018-06-03 DIAGNOSIS — N189 Chronic kidney disease, unspecified: Secondary | ICD-10-CM | POA: Diagnosis not present

## 2018-06-03 DIAGNOSIS — I129 Hypertensive chronic kidney disease with stage 1 through stage 4 chronic kidney disease, or unspecified chronic kidney disease: Secondary | ICD-10-CM | POA: Diagnosis not present

## 2018-06-03 DIAGNOSIS — D519 Vitamin B12 deficiency anemia, unspecified: Secondary | ICD-10-CM

## 2018-06-03 DIAGNOSIS — Z79899 Other long term (current) drug therapy: Secondary | ICD-10-CM | POA: Diagnosis not present

## 2018-06-03 LAB — CBC WITH DIFFERENTIAL/PLATELET
Basophils Absolute: 0 10*3/uL (ref 0.0–0.1)
Basophils Relative: 1 %
Eosinophils Absolute: 0.1 10*3/uL (ref 0.0–0.7)
Eosinophils Relative: 1 %
HCT: 31.5 % — ABNORMAL LOW (ref 36.0–46.0)
Hemoglobin: 10.7 g/dL — ABNORMAL LOW (ref 12.0–15.0)
Lymphocytes Relative: 52 %
Lymphs Abs: 3.4 10*3/uL (ref 0.7–4.0)
MCH: 32.4 pg (ref 26.0–34.0)
MCHC: 34 g/dL (ref 30.0–36.0)
MCV: 95.5 fL (ref 78.0–100.0)
Monocytes Absolute: 0.4 10*3/uL (ref 0.1–1.0)
Monocytes Relative: 6 %
Neutro Abs: 2.7 10*3/uL (ref 1.7–7.7)
Neutrophils Relative %: 40 %
Platelets: 191 10*3/uL (ref 150–400)
RBC: 3.3 MIL/uL — ABNORMAL LOW (ref 3.87–5.11)
RDW: 14.3 % (ref 11.5–15.5)
WBC: 6.6 10*3/uL (ref 4.0–10.5)

## 2018-06-03 MED ORDER — DARBEPOETIN ALFA 60 MCG/0.3ML IJ SOSY
50.0000 ug | PREFILLED_SYRINGE | Freq: Once | INTRAMUSCULAR | Status: AC
  Administered 2018-06-03: 50 ug via SUBCUTANEOUS
  Filled 2018-06-03: qty 0.3

## 2018-06-03 NOTE — Progress Notes (Signed)
Aranesp 49mcg given sq given today in the abdomen. No pt complaints. Tolerated well. VSS. BP wnl. Pt discharged from clinic via wheelchair taken down to private vehicle by daughter. Pt stable. F/u as scheduled.

## 2018-06-03 NOTE — Patient Instructions (Signed)
[place] at [facility]  Discharge Instructions: Aranesp given today. F/u as scheduled. _______________________________________________________________  Thank you for choosing [place] at [facility] to provide your oncology and hematology care.  To afford each patient quality time with our providers, please arrive at least 15 minutes before your scheduled appointment.  You need to re-schedule your appointment if you arrive 10 or more minutes late.  We strive to give you quality time with our providers, and arriving late affects you and other patients whose appointments are after yours.  Also, if you no show three or more times for appointments you may be dismissed from the clinic.  Again, thank you for choosing [place] at [place] hope is that these requests will allow you access to exceptional care and in a timely manner. _______________________________________________________________  If you have questions after your visit, please contact our office at [phone] between the hours of 8:30 a.m. and 5:00 p.m. Voicemails left after 4:30 p.m. will not be returned until the following business day. _______________________________________________________________  For prescription refill requests, have your pharmacy contact our office. _______________________________________________________________  Recommendations made by the consultant and any test results will be sent to your referring physician. _______________________________________________________________[facility] at [facility]  Discharge Instructions:   _______________________________________________________________  Thank you for choosing [place] at [facility] to provide your oncology and hematology care.  To afford each patient quality time with our providers, please arrive at least 15 minutes before  your scheduled appointment.  You need to re-schedule your appointment if you arrive 10 or more minutes late.  We strive to give you quality time with our providers, and arriving late affects you and other patients whose appointments are after yours.  Also, if you no show three or more times for appointments you may be dismissed from the clinic.  Again, thank you for choosing [place] at [place] hope is that these requests will allow you access to exceptional care and in a timely manner. _______________________________________________________________  If you have questions after your visit, please contact our office at [phone] between the hours of 8:30 a.m. and 5:00 p.m. Voicemails left after 4:30 p.m. will not be returned until the following business day. _______________________________________________________________  For prescription refill requests, have your pharmacy contact our office. _______________________________________________________________  Recommendations made by the consultant and any test results will be sent to your referring physician. _______________________________________________________________

## 2018-06-11 ENCOUNTER — Encounter (HOSPITAL_COMMUNITY): Payer: Self-pay | Admitting: Internal Medicine

## 2018-06-11 ENCOUNTER — Inpatient Hospital Stay (HOSPITAL_COMMUNITY): Payer: Medicare Other | Attending: Hematology | Admitting: Internal Medicine

## 2018-06-11 ENCOUNTER — Other Ambulatory Visit: Payer: Self-pay

## 2018-06-11 VITALS — BP 162/82 | HR 95 | Temp 98.0°F | Resp 16

## 2018-06-11 DIAGNOSIS — R5383 Other fatigue: Secondary | ICD-10-CM

## 2018-06-11 DIAGNOSIS — Z79899 Other long term (current) drug therapy: Secondary | ICD-10-CM

## 2018-06-11 DIAGNOSIS — D631 Anemia in chronic kidney disease: Secondary | ICD-10-CM | POA: Diagnosis not present

## 2018-06-11 DIAGNOSIS — E538 Deficiency of other specified B group vitamins: Secondary | ICD-10-CM | POA: Diagnosis not present

## 2018-06-11 DIAGNOSIS — N183 Chronic kidney disease, stage 3 unspecified: Secondary | ICD-10-CM

## 2018-06-11 DIAGNOSIS — I10 Essential (primary) hypertension: Secondary | ICD-10-CM | POA: Diagnosis not present

## 2018-06-11 NOTE — Progress Notes (Signed)
Diagnosis Anemia in stage 3 chronic kidney disease ([place]) - Plan: CBC with Differential/Platelet, Comprehensive metabolic panel, Lactate dehydrogenase, Protein electrophoresis, serum, Ferritin, Vitamin B12, Methylmalonic acid, serum, Folate  Staging Cancer Staging No matching staging information was found for the patient.  Assessment and Plan:  1.  Anemia: Felt secondary to chronic kidney disease. Possible component of iron deficiency as well with EGD revealing erosive gastritis and moderate duodenitis.   Pt was treated with Aranesp on 06/03/2018.  Labs done 06/03/2018 reviewed and showed WBC 6.7 HB 10.7 and plts 191,000.  She will RTC 08/2018 for follow-up and repeat labs and possible Aranesp at that time.  Pt was on Aranesp every 8 weeks per NP Renato Battles.    2.  Vitamin B12 deficiency: Pt will continue monthly vitamin B12 injections at home as directed. Will check B12, folate and MMA on RTC in 08/2018.    3.  HTN.  BP is 162/82.  Follow-up with PCP.    4.  Fatigue.  HB 10.7.  Will repeat labs on RTC in 08/2018.     Current Status:  Pt is seen today for follow-up to go over labs.     Problem List Patient Active Problem List   Diagnosis Date Noted  . Altered mental status [R41.82] 03/24/2017  . Anemia in chronic kidney disease [N18.9, D63.1] 01/20/2016  . Chronic renal disease, stage 3, moderately decreased glomerular filtration rate (GFR) between 30-59 mL/min/1.73 square meter ([place]) [N18.3] 01/07/2016  . B12 deficiency anemia [D51.9] 10/25/2015  . Dysphagia [R13.10] 03/13/2015  . AP (abdominal pain) [R10.9]   . Hematemesis without nausea [K92.0]   . Atrial fibrillation and flutter ([place]) [I48.91, I48.92]   . AKI (acute kidney injury) ([place]) [N17.9]   . Bacteremia [R78.81]   . Essential hypertension [I10]   . Septic joint of left knee joint ([place]) [M00.9]   . Blood poisoning [IMO0001]   . Fever [R50.9] 12/22/2014  . History of fractured kneecap [Z87.81] 12/22/2014  . Knee fracture,  left [IMO0002] 12/22/2014  . Sepsis ([place]) [A41.9] 12/22/2014  . Tachycardia with 141 - 160 beats per minute [R00.0] 12/22/2014  . Diabetes mellitus without complication ([place]) [X32.3]   . Hypertension [I10]   . HLD (hyperlipidemia) [E78.5]   . Depression [F32.9]     Past Medical History Past Medical History:  Diagnosis Date  . Anemia in chronic kidney disease 01/20/2016  . B12 deficiency anemia 10/25/2015  . Chronic renal disease, stage 3, moderately decreased glomerular filtration rate (GFR) between 30-59 mL/min/1.73 square meter ([place]) 01/07/2016  . Depression   . Essential hypertension   . GI bleed    Severe gastritis and duodenal ulcers by EGD May 2016   . HLD (hyperlipidemia)   . PSVT (paroxysmal supraventricular tachycardia) ([place])   . Septic arthritis (HCC)    MRSA, left knee   . Type 2 diabetes mellitus [facility])     Past Surgical History Past Surgical History:  Procedure Laterality Date  . CHOLECYSTECTOMY N/A 11/04/2012   Procedure: LAPAROSCOPIC CHOLECYSTECTOMY;  Surgeon: Jamesetta So, MD;  Location: [facility];  Service: General;  Laterality: N/A;  . ESOPHAGOGASTRODUODENOSCOPY N/A 01/06/2015   SLF: mild esophagitis  . I&D EXTREMITY Left 12/24/2014   Procedure: IRRIGATION AND DEBRIDEMENT EXTREMITY AND ARTHROSCOPY KNEE;  Surgeon: Renette Butters, MD;  Location: [place];  Service: Orthopedics;  Laterality: Left;  . KNEE SURGERY    . SAVORY DILATION  01/06/2015   Procedure: SAVORY DILATION;  Surgeon: Danie Binder, MD;  [facility];  Service: Endoscopy;;    Family History Family History  Family history unknown: Yes     Social History  reports that she has never smoked. She has never used smokeless tobacco. She reports that she does not drink alcohol or use drugs.  Medications  Current Outpatient Medications:  .  acetaminophen (TYLENOL) 325 MG tablet, Take 650 mg by mouth every 6 (six) hours as needed for mild pain or moderate pain. , Disp: , Rfl:  .  cyanocobalamin  (,VITAMIN B-12,) 1000 MCG/ML injection, ADMINISTER 1 ML(1000 MCG) UNDER THE SKIN EVERY 30 DAYS, Disp: 1 mL, Rfl: 3 .  diltiazem (CARDIZEM CD) 240 MG 24 hr capsule, Take 1 capsule (240 mg total) by mouth daily., Disp: 30 capsule, Rfl: 12 .  donepezil (ARICEPT) 10 MG tablet, , Disp: , Rfl:  .  donepezil (ARICEPT) 5 MG tablet, Take 5 mg by mouth at bedtime., Disp: , Rfl:  .  losartan (COZAAR) 100 MG tablet, Take 100 mg by mouth daily. , Disp: , Rfl:  .  pantoprazole (PROTONIX) 40 MG tablet, TAKE 1 TABLET(40 MG) BY MOUTH DAILY, Disp: 90 tablet, Rfl: 3  Allergies Oxycodone  Review of Systems Review of Systems - Oncology ROS negative other than fatigue.    Physical Exam  Vitals Wt Readings from Last 3 Encounters:  12/17/17 156 lb 9.6 oz (71 kg)  08/13/17 141 lb (64 kg)  03/25/17 138 lb 14.2 oz (63 kg)   Temp Readings from Last 3 Encounters:  06/11/18 98 F (36.7 C) (Oral)  06/03/18 (!) 96.4 F (35.8 C) (Tympanic)  04/08/18 97.8 F (36.6 C) (Oral)   BP Readings from Last 3 Encounters:  06/11/18 (!) 162/82  06/03/18 118/60  04/08/18 (!) 133/52   Pulse Readings from Last 3 Encounters:  06/11/18 95  06/03/18 82  04/08/18 69   Constitutional: Elderly female seated in wheelchair in NAD.   HENT: Head: Normocephalic and atraumatic.  Mouth/Throat: No oropharyngeal exudate. Mucosa moist. Eyes: Pupils are equal, round, and reactive to light. Conjunctivae are normal. No scleral icterus.  Neck: Normal range of motion. Neck supple. No JVD present.  Cardiovascular: Normal rate, regular rhythm and normal heart sounds.  Exam reveals no gallop and no friction rub.   No murmur heard. Pulmonary/Chest: Effort normal and breath sounds normal. No respiratory distress. No wheezes.No rales.  Abdominal: Soft. Bowel sounds are normal. No distension. There is no tenderness. There is no guarding.  Musculoskeletal: No edema or tenderness.  Lymphadenopathy: No cervical, axillary or supraclavicular  adenopathy.  Neurological: Alert and oriented to person, place, and time. No cranial nerve deficit.  Skin: Skin is warm and dry. No rash noted. No erythema. No pallor.  Psychiatric: Affect and judgment normal.   Labs No visits with results within 3 Day(s) from this visit.  Latest known visit with results is:  Appointment on 06/03/2018  Component Date Value Ref Range Status  . WBC 06/03/2018 6.6  4.0 - 10.5 K/uL Final  . RBC 06/03/2018 3.30* 3.87 - 5.11 MIL/uL Final  . Hemoglobin 06/03/2018 10.7* 12.0 - 15.0 g/dL Final  . HCT 06/03/2018 31.5* 36.0 - 46.0 % Final  . MCV 06/03/2018 95.5  78.0 - 100.0 fL Final  . MCH 06/03/2018 32.4  26.0 - 34.0 pg Final  . MCHC 06/03/2018 34.0  30.0 - 36.0 g/dL Final  . RDW 06/03/2018 14.3  11.5 - 15.5 % Final  . Platelets 06/03/2018 191  150 - 400 K/uL Final  . Neutrophils Relative %  06/03/2018 40  % Final  . Neutro Abs 06/03/2018 2.7  1.7 - 7.7 K/uL Final  . Lymphocytes Relative 06/03/2018 52  % Final  . Lymphs Abs 06/03/2018 3.4  0.7 - 4.0 K/uL Final  . Monocytes Relative 06/03/2018 6  % Final  . Monocytes Absolute 06/03/2018 0.4  0.1 - 1.0 K/uL Final  . Eosinophils Relative 06/03/2018 1  % Final  . Eosinophils Absolute 06/03/2018 0.1  0.0 - 0.7 K/uL Final  . Basophils Relative 06/03/2018 1  % Final  . Basophils Absolute 06/03/2018 0.0  0.0 - 0.1 K/uL Final   Performed at [facility] [address], [place], [place] [address]     Pathology Orders Placed This Encounter  Procedures  . CBC with Differential/Platelet    Standing Status:   Future    Standing Expiration Date:   06/12/2019  . Comprehensive metabolic panel    Standing Status:   Future    Standing Expiration Date:   06/12/2019  . Lactate dehydrogenase    Standing Status:   Future    Standing Expiration Date:   06/12/2019  . Protein electrophoresis, serum    Standing Status:   Future    Standing Expiration Date:   06/12/2019  . Ferritin    Standing Status:   Future    Standing  Expiration Date:   06/12/2019  . Vitamin B12    Standing Status:   Future    Standing Expiration Date:   06/12/2019  . Methylmalonic acid, serum    Standing Status:   Future    Standing Expiration Date:   06/12/2019  . Folate    Standing Status:   Future    Standing Expiration Date:   06/12/2019       Zoila Shutter MD

## 2018-07-04 DIAGNOSIS — E119 Type 2 diabetes mellitus without complications: Secondary | ICD-10-CM | POA: Diagnosis not present

## 2018-07-09 DIAGNOSIS — E1129 Type 2 diabetes mellitus with other diabetic kidney complication: Secondary | ICD-10-CM | POA: Diagnosis not present

## 2018-07-09 DIAGNOSIS — G309 Alzheimer's disease, unspecified: Secondary | ICD-10-CM | POA: Diagnosis not present

## 2018-07-09 DIAGNOSIS — I10 Essential (primary) hypertension: Secondary | ICD-10-CM | POA: Diagnosis not present

## 2018-07-09 DIAGNOSIS — D638 Anemia in other chronic diseases classified elsewhere: Secondary | ICD-10-CM | POA: Diagnosis not present

## 2018-07-09 DIAGNOSIS — N183 Chronic kidney disease, stage 3 (moderate): Secondary | ICD-10-CM | POA: Diagnosis not present

## 2018-07-16 DIAGNOSIS — E1122 Type 2 diabetes mellitus with diabetic chronic kidney disease: Secondary | ICD-10-CM | POA: Diagnosis not present

## 2018-07-16 DIAGNOSIS — I129 Hypertensive chronic kidney disease with stage 1 through stage 4 chronic kidney disease, or unspecified chronic kidney disease: Secondary | ICD-10-CM | POA: Diagnosis not present

## 2018-07-16 DIAGNOSIS — Z23 Encounter for immunization: Secondary | ICD-10-CM | POA: Diagnosis not present

## 2018-07-16 DIAGNOSIS — N183 Chronic kidney disease, stage 3 (moderate): Secondary | ICD-10-CM | POA: Diagnosis not present

## 2018-07-23 DIAGNOSIS — H2513 Age-related nuclear cataract, bilateral: Secondary | ICD-10-CM | POA: Diagnosis not present

## 2018-07-23 DIAGNOSIS — H52222 Regular astigmatism, left eye: Secondary | ICD-10-CM | POA: Diagnosis not present

## 2018-07-23 DIAGNOSIS — H524 Presbyopia: Secondary | ICD-10-CM | POA: Diagnosis not present

## 2018-07-23 DIAGNOSIS — H5203 Hypermetropia, bilateral: Secondary | ICD-10-CM | POA: Diagnosis not present

## 2018-07-29 ENCOUNTER — Ambulatory Visit (HOSPITAL_COMMUNITY): Payer: Self-pay

## 2018-07-29 ENCOUNTER — Other Ambulatory Visit (HOSPITAL_COMMUNITY): Payer: Self-pay

## 2018-08-05 ENCOUNTER — Other Ambulatory Visit (HOSPITAL_COMMUNITY): Payer: Self-pay

## 2018-08-05 ENCOUNTER — Ambulatory Visit (HOSPITAL_COMMUNITY): Payer: Self-pay | Admitting: Internal Medicine

## 2018-08-05 ENCOUNTER — Ambulatory Visit (HOSPITAL_COMMUNITY): Payer: Self-pay

## 2018-10-03 DIAGNOSIS — E119 Type 2 diabetes mellitus without complications: Secondary | ICD-10-CM | POA: Diagnosis not present

## 2018-10-08 DIAGNOSIS — I10 Essential (primary) hypertension: Secondary | ICD-10-CM | POA: Diagnosis not present

## 2018-10-08 DIAGNOSIS — N183 Chronic kidney disease, stage 3 (moderate): Secondary | ICD-10-CM | POA: Diagnosis not present

## 2018-10-08 DIAGNOSIS — E1129 Type 2 diabetes mellitus with other diabetic kidney complication: Secondary | ICD-10-CM | POA: Diagnosis not present

## 2018-10-12 ENCOUNTER — Other Ambulatory Visit: Payer: Self-pay | Admitting: Gastroenterology

## 2018-10-12 DIAGNOSIS — K219 Gastro-esophageal reflux disease without esophagitis: Secondary | ICD-10-CM

## 2018-10-12 NOTE — Telephone Encounter (Signed)
PLEASE MAKE PATIENT AN OV. PATIENT LAST SEEN 2017

## 2018-10-13 ENCOUNTER — Encounter: Payer: Self-pay | Admitting: Gastroenterology

## 2018-10-13 NOTE — Telephone Encounter (Signed)
PATIENT SCHEDULED AND LETTER SENT  °

## 2018-10-15 DIAGNOSIS — I10 Essential (primary) hypertension: Secondary | ICD-10-CM | POA: Diagnosis not present

## 2018-10-15 DIAGNOSIS — E1122 Type 2 diabetes mellitus with diabetic chronic kidney disease: Secondary | ICD-10-CM | POA: Diagnosis not present

## 2018-10-15 DIAGNOSIS — G309 Alzheimer's disease, unspecified: Secondary | ICD-10-CM | POA: Diagnosis not present

## 2018-10-15 DIAGNOSIS — Z6824 Body mass index (BMI) 24.0-24.9, adult: Secondary | ICD-10-CM | POA: Diagnosis not present

## 2018-10-15 DIAGNOSIS — N183 Chronic kidney disease, stage 3 (moderate): Secondary | ICD-10-CM | POA: Diagnosis not present

## 2018-12-21 ENCOUNTER — Ambulatory Visit (INDEPENDENT_AMBULATORY_CARE_PROVIDER_SITE_OTHER): Payer: Medicare Other | Admitting: Gastroenterology

## 2018-12-21 ENCOUNTER — Encounter: Payer: Self-pay | Admitting: Gastroenterology

## 2018-12-21 ENCOUNTER — Other Ambulatory Visit: Payer: Self-pay

## 2018-12-21 DIAGNOSIS — K5900 Constipation, unspecified: Secondary | ICD-10-CM

## 2018-12-21 DIAGNOSIS — K219 Gastro-esophageal reflux disease without esophagitis: Secondary | ICD-10-CM

## 2018-12-21 MED ORDER — PANTOPRAZOLE SODIUM 40 MG PO TBEC: DELAYED_RELEASE_TABLET | ORAL | 3 refills | Status: DC

## 2018-12-21 NOTE — Progress Notes (Signed)
cc'ed to pcp °

## 2018-12-21 NOTE — Progress Notes (Signed)
Primary Care Physician:  [name], MD Primary GI:  Barney Drain, MD   Patient Location: Home  Provider Location: [facility] office  Reason for Phone Visit: GERD, refills  Persons present on the phone encounter, with roles: Patient, daughter Mandie Crabbe (help with history due to patient's memory issues), myself (provider), Zara Council, LPN (updated meds and allergies)  Total time (minutes) spent on medical discussion: 12 minutes  Due to COVID-19, visit was conducted using telephonic method (no video was available).  Visit was requested by patient.  Virtual Visit via Telephone only  I connected with  Ms. Barnick on 12/21/18 at  9:30 AM EDT by telephone and verified that I am speaking with the correct person using two identifiers.   I discussed the limitations, risks, security and privacy concerns of performing an evaluation and management service by telephone and the availability of in person appointments. I also discussed with the patient that there may be a patient responsible charge related to this service. The patient expressed understanding and agreed to proceed.   HPI:   Patient is a pleasant 85 year old female who presents for telephone visit regarding GERD, need for refills.  Patient was last seen in 2017.  She has been on pantoprazole 40 mg daily.  Her daughter Dedee Liss helped with the visit today as patient has some dementia.  Daylee Delahoz is with the patient 90% of the time.  She is been doing well from a GI standpoint.  Reflux is well controlled on pantoprazole 40 mg daily.  Her appetite is been good.  No complaints of indigestion or abdominal pain.  She has had some mild constipation and has taken apple juice daily to help, rarely takes Dulcolax.  No melena or rectal taking lots of water.  Urine is clear.  No concerns for weight loss.  Patient is followed by hematology for anemia and stage III kidney disease.  She receives B12 injection once per month.  Sometimes  receives Aranesp if hemoglobin is less than 11.  She is behind on blood work due to COVID-19 crisis.    Current Outpatient Medications  Medication Sig Dispense Refill  . acetaminophen (TYLENOL) 325 MG tablet Take 650 mg by mouth every 6 (six) hours as needed for mild pain or moderate pain.     . cyanocobalamin (,VITAMIN B-12,) 1000 MCG/ML injection ADMINISTER 1 ML(1000 MCG) UNDER THE SKIN EVERY 30 DAYS 1 mL 3  . donepezil (ARICEPT) 10 MG tablet Take 10 mg by mouth at bedtime.     Marland Kitchen losartan (COZAAR) 100 MG tablet Take 100 mg by mouth daily.     . pantoprazole (PROTONIX) 40 MG tablet TAKE 1 TABLET(40 MG) BY MOUTH DAILY 90 tablet 0   No current facility-administered medications for this visit.     Past Medical History:  Diagnosis Date  . Anemia in chronic kidney disease 01/20/2016  . B12 deficiency anemia 10/25/2015  . Chronic renal disease, stage 3, moderately decreased glomerular filtration rate (GFR) between 30-59 mL/min/1.73 square meter ([place]) 01/07/2016  . Depression   . Essential hypertension   . GI bleed    Severe gastritis and duodenal ulcers by EGD May 2016   . HLD (hyperlipidemia)   . PSVT (paroxysmal supraventricular tachycardia) ([place])   . Septic arthritis (HCC)    MRSA, left knee   . Type 2 diabetes mellitus ([place])     Past Surgical History:  Procedure Laterality Date  . CHOLECYSTECTOMY N/A 11/04/2012   Procedure:  LAPAROSCOPIC CHOLECYSTECTOMY;  Surgeon: Jamesetta So, MD;  Location: [facility];  Service: General;  Laterality: N/A;  . ESOPHAGOGASTRODUODENOSCOPY N/A 01/06/2015   SLF: mild esophagitis  . I&D EXTREMITY Left 12/24/2014   Procedure: IRRIGATION AND DEBRIDEMENT EXTREMITY AND ARTHROSCOPY KNEE;  Surgeon: Renette Butters, MD;  Location: [facility];  Service: Orthopedics;  Laterality: Left;  . KNEE SURGERY    . SAVORY DILATION  01/06/2015   Procedure: SAVORY DILATION;  Surgeon: Danie Binder, MD;  [facility];  Service: Endoscopy;;    Family History  Family  history unknown: Yes    Social History   Socioeconomic History  . Marital status: Widowed    Spouse name: Not on file  . Number of children: 31  . Years of education: Not on file  . Highest education level: Not on file  Occupational History  . Occupation: retired; [employer]  . Financial resource strain: Not on file  . Food insecurity:    Worry: Not on file    Inability: Not on file  . Transportation needs:    Medical: Not on file    Non-medical: Not on file  Tobacco Use  . Smoking status: Never Smoker  . Smokeless tobacco: Never Used  . Tobacco comment: Never smoked  Substance and Sexual Activity  . Alcohol use: No    Alcohol/week: 0.0 standard drinks  . Drug use: No  . Sexual activity: Not on file  Lifestyle  . Physical activity:    Days per week: Not on file    Minutes per session: Not on file  . Stress: Not on file  Relationships  . Social connections:    Talks on phone: Not on file    Gets together: Not on file    Attends religious service: Not on file    Active member of club or organization: Not on file    Attends meetings of clubs or organizations: Not on file    Relationship status: Not on file  . Intimate partner violence:    Fear of current or ex partner: Not on file    Emotionally abused: Not on file    Physically abused: Not on file    Forced sexual activity: Not on file  Other Topics Concern  . Not on file  Social History Narrative   Lives alone   3 daughters & granddaughter nearby   Lost 1 son-strep            ROS: History somewhat unreliable but answers provided by daughter based on her observations.  General: Negative for anorexia, weight loss, fever, chills, fatigue, weakness. Eyes: Negative for vision changes.  ENT: Negative for hoarseness, difficulty swallowing , nasal congestion. CV: Negative for chest pain, angina, palpitations, dyspnea on exertion, peripheral edema.  Respiratory: Negative for dyspnea at rest,  dyspnea on exertion, cough, sputum, wheezing.  GI: See history of present illness. GU:  Negative for dysuria, hematuria, urinary incontinence, urinary frequency, nocturnal urination.  MS: Negative for joint pain, low back pain.  Derm: Negative for rash or itching.  Neuro: Negative for weakness, abnormal sensation, seizure, frequent headaches, memory loss, confusion.  Psych: Negative for anxiety, depression, suicidal ideation, hallucinations.  Endo: Negative for unusual weight change.  Heme: Negative for bruising or bleeding. Allergy: Negative for rash or hives.   Observations/Objective: Pleasant, no acute distress.  Some memory issues noted with trying to obtain medications today.  Daughter provided 90% of information today.  Otherwise exam unavailable.  Assessment and Plan: Pleasant 85 year old female with history of chronic GERD with history of esophagitis/gastritis/duodenitis/duodenal ulcers back in 2016.  We last saw her in 2017.  Her reflux has been well controlled.  Appetite is good.  There have been no complaints of abdominal pain.  No overt GI bleeding i.e. melena or rectal bleeding.  Her hemoglobin has ranged from 10-1/2 to an 11-1/2 over the past couple years, followed by hematology, receiving B12 injections monthly and Aranesp as needed.    Plans to follow-up with hematology as able to move the COVID-19 crisis.  She will continue pantoprazole 40 mg daily before breakfast.  Office visit here in 2 years.  Follow Up Instructions:    I discussed the assessment and treatment plan with the patient. The patient was provided an opportunity to ask questions and all were answered. The patient agreed with the plan and demonstrated an understanding of the instructions. AVS mailed to patient's home address.   The patient was advised to call back or seek an in-person evaluation if the symptoms worsen or if the condition fails to improve as anticipated.  I provided 12 minutes of  non-face-to-face time during this encounter.   Neil Crouch, PA-C

## 2018-12-21 NOTE — Patient Instructions (Signed)
1. Continue pantoprazole 40 mg daily before breakfast for acid reflux. 2. For constipation, you can continue apple juice, prune juice, as well as high-fiber foods.  If additional help is needed for constipation, she can try Dulcolax or MiraLAX.  Would suggest Dulcolax 5 mg, 1 to 2 tablets at a time on occasion but not daily.  MiraLAX can be given 1 capful daily if needed.   3. We will plan to see her back in 2 years, call sooner if needed.

## 2018-12-29 NOTE — Progress Notes (Signed)
REVIEWED-NO ADDITIONAL RECOMMENDATIONS. 

## 2019-01-02 DIAGNOSIS — E119 Type 2 diabetes mellitus without complications: Secondary | ICD-10-CM | POA: Diagnosis not present

## 2019-01-12 DIAGNOSIS — G309 Alzheimer's disease, unspecified: Secondary | ICD-10-CM | POA: Diagnosis not present

## 2019-01-12 DIAGNOSIS — E1129 Type 2 diabetes mellitus with other diabetic kidney complication: Secondary | ICD-10-CM | POA: Diagnosis not present

## 2019-01-12 DIAGNOSIS — N183 Chronic kidney disease, stage 3 (moderate): Secondary | ICD-10-CM | POA: Diagnosis not present

## 2019-01-12 DIAGNOSIS — I10 Essential (primary) hypertension: Secondary | ICD-10-CM | POA: Diagnosis not present

## 2019-01-19 DIAGNOSIS — G309 Alzheimer's disease, unspecified: Secondary | ICD-10-CM | POA: Diagnosis not present

## 2019-01-19 DIAGNOSIS — E1122 Type 2 diabetes mellitus with diabetic chronic kidney disease: Secondary | ICD-10-CM | POA: Diagnosis not present

## 2019-01-19 DIAGNOSIS — N183 Chronic kidney disease, stage 3 (moderate): Secondary | ICD-10-CM | POA: Diagnosis not present

## 2019-02-01 ENCOUNTER — Encounter (HOSPITAL_COMMUNITY): Payer: Self-pay | Admitting: *Deleted

## 2019-02-01 ENCOUNTER — Other Ambulatory Visit: Payer: Self-pay

## 2019-02-01 ENCOUNTER — Observation Stay (HOSPITAL_COMMUNITY)
Admission: EM | Admit: 2019-02-01 | Discharge: 2019-02-02 | Disposition: A | Discharge status: Home / Self Care | Payer: Medicare Other | Attending: Family Medicine | Admitting: Family Medicine

## 2019-02-01 ENCOUNTER — Emergency Department (HOSPITAL_COMMUNITY): Payer: Medicare Other

## 2019-02-01 DIAGNOSIS — R001 Bradycardia, unspecified: Secondary | ICD-10-CM | POA: Diagnosis not present

## 2019-02-01 DIAGNOSIS — I10 Essential (primary) hypertension: Secondary | ICD-10-CM | POA: Diagnosis not present

## 2019-02-01 DIAGNOSIS — R2681 Unsteadiness on feet: Secondary | ICD-10-CM | POA: Insufficient documentation

## 2019-02-01 DIAGNOSIS — N183 Chronic kidney disease, stage 3 unspecified: Secondary | ICD-10-CM | POA: Diagnosis present

## 2019-02-01 DIAGNOSIS — E1122 Type 2 diabetes mellitus with diabetic chronic kidney disease: Secondary | ICD-10-CM | POA: Insufficient documentation

## 2019-02-01 DIAGNOSIS — E119 Type 2 diabetes mellitus without complications: Secondary | ICD-10-CM

## 2019-02-01 DIAGNOSIS — Z1159 Encounter for screening for other viral diseases: Secondary | ICD-10-CM | POA: Insufficient documentation

## 2019-02-01 DIAGNOSIS — K219 Gastro-esophageal reflux disease without esophagitis: Secondary | ICD-10-CM | POA: Diagnosis not present

## 2019-02-01 DIAGNOSIS — R0789 Other chest pain: Principal | ICD-10-CM | POA: Insufficient documentation

## 2019-02-01 DIAGNOSIS — R0689 Other abnormalities of breathing: Secondary | ICD-10-CM | POA: Diagnosis not present

## 2019-02-01 DIAGNOSIS — R079 Chest pain, unspecified: Secondary | ICD-10-CM | POA: Diagnosis not present

## 2019-02-01 DIAGNOSIS — Z885 Allergy status to narcotic agent status: Secondary | ICD-10-CM | POA: Insufficient documentation

## 2019-02-01 DIAGNOSIS — R61 Generalized hyperhidrosis: Secondary | ICD-10-CM | POA: Insufficient documentation

## 2019-02-01 DIAGNOSIS — D631 Anemia in chronic kidney disease: Secondary | ICD-10-CM | POA: Diagnosis present

## 2019-02-01 DIAGNOSIS — D649 Anemia, unspecified: Secondary | ICD-10-CM | POA: Diagnosis present

## 2019-02-01 DIAGNOSIS — F0390 Unspecified dementia without behavioral disturbance: Secondary | ICD-10-CM | POA: Diagnosis present

## 2019-02-01 DIAGNOSIS — I129 Hypertensive chronic kidney disease with stage 1 through stage 4 chronic kidney disease, or unspecified chronic kidney disease: Secondary | ICD-10-CM | POA: Insufficient documentation

## 2019-02-01 DIAGNOSIS — Z79899 Other long term (current) drug therapy: Secondary | ICD-10-CM | POA: Insufficient documentation

## 2019-02-01 DIAGNOSIS — E785 Hyperlipidemia, unspecified: Secondary | ICD-10-CM | POA: Diagnosis present

## 2019-02-01 DIAGNOSIS — N189 Chronic kidney disease, unspecified: Secondary | ICD-10-CM | POA: Diagnosis not present

## 2019-02-01 DIAGNOSIS — R1310 Dysphagia, unspecified: Secondary | ICD-10-CM | POA: Diagnosis not present

## 2019-02-01 DIAGNOSIS — R072 Precordial pain: Secondary | ICD-10-CM

## 2019-02-01 LAB — COMPREHENSIVE METABOLIC PANEL
ALT: 21 U/L (ref 0–44)
AST: 64 U/L — ABNORMAL HIGH (ref 15–41)
Albumin: 3.7 g/dL (ref 3.5–5.0)
Alkaline Phosphatase: 68 U/L (ref 38–126)
Anion gap: 11 (ref 5–15)
BUN: 23 mg/dL (ref 8–23)
CO2: 21 mmol/L — ABNORMAL LOW (ref 22–32)
Calcium: 8.7 mg/dL — ABNORMAL LOW (ref 8.9–10.3)
Chloride: 111 mmol/L (ref 98–111)
Creatinine, Ser: 1.43 mg/dL — ABNORMAL HIGH (ref 0.44–1.00)
GFR calc Af Amer: 38 mL/min — ABNORMAL LOW (ref >60)
GFR calc non Af Amer: 33 mL/min — ABNORMAL LOW (ref >60)
Glucose, Bld: 128 mg/dL — ABNORMAL HIGH (ref 70–99)
Potassium: 4.3 mmol/L (ref 3.5–5.1)
Sodium: 143 mmol/L (ref 135–145)
Total Bilirubin: 0.7 mg/dL (ref 0.3–1.2)
Total Protein: 7.2 g/dL (ref 6.5–8.1)

## 2019-02-01 LAB — CBC WITH DIFFERENTIAL/PLATELET
Abs Immature Granulocytes: 0.07 10*3/uL (ref 0.00–0.07)
Basophils Absolute: 0 10*3/uL (ref 0.0–0.1)
Basophils Relative: 0 %
Eosinophils Absolute: 0.1 10*3/uL (ref 0.0–0.5)
Eosinophils Relative: 1 %
HCT: 33.2 % — ABNORMAL LOW (ref 36.0–46.0)
Hemoglobin: 10.7 g/dL — ABNORMAL LOW (ref 12.0–15.0)
Immature Granulocytes: 1 %
Lymphocytes Relative: 46 %
Lymphs Abs: 3.5 10*3/uL (ref 0.7–4.0)
MCH: 31.7 pg (ref 26.0–34.0)
MCHC: 32.2 g/dL (ref 30.0–36.0)
MCV: 98.2 fL (ref 80.0–100.0)
Monocytes Absolute: 0.6 10*3/uL (ref 0.1–1.0)
Monocytes Relative: 7 %
Neutro Abs: 3.4 10*3/uL (ref 1.7–7.7)
Neutrophils Relative %: 45 %
Platelets: 174 10*3/uL (ref 150–400)
RBC: 3.38 MIL/uL — ABNORMAL LOW (ref 3.87–5.11)
RDW: 14.4 % (ref 11.5–15.5)
WBC: 7.7 10*3/uL (ref 4.0–10.5)
nRBC: 0 % (ref 0.0–0.2)

## 2019-02-01 LAB — TROPONIN I
Troponin I: <0.03 ng/mL (ref <0.03)
Troponin I: <0.03 ng/mL (ref <0.03)
Troponin I: <0.03 ng/mL (ref <0.03)
Troponin I: <0.03 ng/mL (ref <0.03)

## 2019-02-01 LAB — TSH: TSH: 3.378 u[IU]/mL (ref 0.350–4.500)

## 2019-02-01 LAB — SARS CORONAVIRUS 2 BY RT PCR (HOSPITAL ORDER, PERFORMED IN ~~LOC~~ HOSPITAL LAB): SARS Coronavirus 2: NEGATIVE

## 2019-02-01 LAB — HEMOGLOBIN A1C
Hgb A1c MFr Bld: 5.5 % (ref 4.8–5.6)
Mean Plasma Glucose: 111.15 mg/dL

## 2019-02-01 LAB — BRAIN NATRIURETIC PEPTIDE: B Natriuretic Peptide: 84 pg/mL (ref 0.0–100.0)

## 2019-02-01 MED ORDER — ENOXAPARIN SODIUM 30 MG/0.3ML ~~LOC~~ SOLN
30.0000 mg | SUBCUTANEOUS | Status: DC
  Administered 2019-02-01: 30 mg via SUBCUTANEOUS
  Filled 2019-02-01: qty 0.3

## 2019-02-01 MED ORDER — MORPHINE SULFATE (PF) 2 MG/ML IV SOLN: 0.5000 mg | INTRAVENOUS | Status: DC | PRN

## 2019-02-01 MED ORDER — ONDANSETRON HCL 4 MG/2ML IJ SOLN: 4.0000 mg | Freq: Four times a day (QID) | INTRAMUSCULAR | Status: DC | PRN

## 2019-02-01 MED ORDER — NITROGLYCERIN 0.4 MG SL SUBL: 0.4000 mg | SUBLINGUAL_TABLET | SUBLINGUAL | Status: DC | PRN

## 2019-02-01 MED ORDER — PANTOPRAZOLE SODIUM 40 MG PO TBEC
40.0000 mg | DELAYED_RELEASE_TABLET | Freq: Two times a day (BID) | ORAL | Status: DC
  Administered 2019-02-01 – 2019-02-02 (×2): 40 mg via ORAL
  Filled 2019-02-01 (×2): qty 1

## 2019-02-01 MED ORDER — ASPIRIN EC 81 MG PO TBEC
81.0000 mg | DELAYED_RELEASE_TABLET | Freq: Every day | ORAL | Status: DC
  Administered 2019-02-02: 81 mg via ORAL
  Filled 2019-02-01: qty 1

## 2019-02-01 MED ORDER — LOSARTAN POTASSIUM 50 MG PO TABS
100.0000 mg | ORAL_TABLET | Freq: Every day | ORAL | Status: DC
  Administered 2019-02-02: 10:00:00 100 mg via ORAL
  Filled 2019-02-01: qty 2

## 2019-02-01 MED ORDER — DONEPEZIL HCL 5 MG PO TABS
10.0000 mg | ORAL_TABLET | Freq: Every day | ORAL | Status: DC
  Administered 2019-02-01: 10 mg via ORAL
  Filled 2019-02-01: qty 2

## 2019-02-01 MED ORDER — ALPRAZOLAM 0.25 MG PO TABS: 0.2500 mg | ORAL_TABLET | Freq: Every evening | ORAL | Status: DC | PRN

## 2019-02-01 MED ORDER — DILTIAZEM HCL ER COATED BEADS 240 MG PO CP24
240.0000 mg | ORAL_CAPSULE | Freq: Every day | ORAL | Status: DC
  Administered 2019-02-02: 10:00:00 240 mg via ORAL
  Filled 2019-02-01: qty 1

## 2019-02-01 MED ORDER — ACETAMINOPHEN 325 MG PO TABS: 650.0000 mg | ORAL_TABLET | ORAL | Status: DC | PRN

## 2019-02-01 NOTE — ED Notes (Signed)
EMS reported pt was up doing her daily activities around her house and suddenly started having chest pressure and became diaphoretic.  Reports ems or fire dept gave asa 325mg .  Pt presently pain free.

## 2019-02-01 NOTE — ED Notes (Signed)
Laura Davenport [phone]

## 2019-02-01 NOTE — ED Triage Notes (Signed)
Patient via EMS complains of chest pain and feeling light headed.  Asprin 325 given by EMS.  Sinus on EKG per EMS.  Blood pressure WDL.  99% RA.

## 2019-02-01 NOTE — ED Notes (Addendum)
ED TO INPATIENT HANDOFF REPORT  ED Nurse Name and Phone #: [phone]  S Name/Age/Gender Laura Davenport 85 y.o. female Room/Bed: APA17/APA17  Code Status   Code Status: Prior  Home/SNF/Other Home Patient oriented to: self Is this baseline? Yes   Triage Complete: Triage complete  Chief Complaint Chest Pain  Triage Note Patient via EMS complains of chest pain and feeling light headed.  Asprin 325 given by EMS.  Sinus on EKG per EMS.  Blood pressure WDL.  99% RA.     Allergies Allergies  Allergen Reactions  . Oxycodone Hives    Level of Care/Admitting Diagnosis ED Disposition    ED Disposition Condition [facility]: [facility] [[id number]]  Level of Care: Telemetry [5]  Covid Evaluation: Screening Protocol (No Symptoms)  Diagnosis: Chest pain on exertion [938101]  Admitting Physician: Murlean Iba [[id number]]  Attending Physician: Murlean Iba [[id number]]  PT Class (Do Not Modify): Observation [104]  PT Acc Code (Do Not Modify): Observation [10022]       B Medical/Surgery History Past Medical History:  Diagnosis Date  . Anemia in chronic kidney disease 01/20/2016  . B12 deficiency anemia 10/25/2015  . Chronic renal disease, stage 3, moderately decreased glomerular filtration rate (GFR) between 30-59 mL/min/1.73 square meter ([place]) 01/07/2016  . Depression   . Essential hypertension   . GI bleed    Severe gastritis and duodenal ulcers by EGD May 2016   . HLD (hyperlipidemia)   . PSVT (paroxysmal supraventricular tachycardia) ([place])   . Septic arthritis (HCC)    MRSA, left knee   . Type 2 diabetes mellitus ([place])    Past Surgical History:  Procedure Laterality Date  . CHOLECYSTECTOMY N/A 11/04/2012   Procedure: LAPAROSCOPIC CHOLECYSTECTOMY;  Surgeon: Jamesetta So, MD;  Location: [facility];  Service: General;  Laterality: N/A;  . ESOPHAGOGASTRODUODENOSCOPY N/A 01/06/2015   SLF: mild esophagitis  . I&D EXTREMITY Left 12/24/2014   Procedure:  IRRIGATION AND DEBRIDEMENT EXTREMITY AND ARTHROSCOPY KNEE;  Surgeon: Renette Butters, MD;  Location: [facility];  Service: Orthopedics;  Laterality: Left;  . KNEE SURGERY    . SAVORY DILATION  01/06/2015   Procedure: SAVORY DILATION;  Surgeon: Danie Binder, MD;  [facility];  Service: Endoscopy;;     A IV Location/Drains/Wounds Patient Lines/Drains/Airways Status   Active Line/Drains/Airways    Name:   Placement date:   Placement time:   Site:   Days:   Peripheral IV 02/01/19 Left Antecubital   02/01/19    0822    Antecubital   less than 1   External Urinary Catheter   03/25/17    0246    -   678   Airway 4 mm   12/24/14    0744     1500   Incision 11/04/12 Abdomen Other (Comment)   11/04/12    1337     2280   Incision (Closed) 12/24/14 Knee Left   12/24/14    0842     1500   Incision - 4 Ports Abdomen  Umbilicus  Right 3: Right;Upper  Superior   11/04/12    1334     2280   Pressure Ulcer 12/26/14 Stage II -  Partial thickness loss of dermis presenting as a shallow open ulcer with a red, pink wound bed without slough.   12/26/14    1600     1498          Intake/Output  Last 24 hours No intake or output data in the 24 hours ending 02/01/19 1054  Labs/Imaging Results for orders placed or performed during the hospital encounter of 02/01/19 (from the past 48 hour(s))  Comprehensive metabolic panel     Status: Abnormal   Collection Time: 02/01/19  8:15 AM  Result Value Ref Range   Sodium 143 135 - 145 mmol/L   Potassium 4.3 3.5 - 5.1 mmol/L   Chloride 111 98 - 111 mmol/L   CO2 21 (L) 22 - 32 mmol/L   Glucose, Bld 128 (H) 70 - 99 mg/dL   BUN 23 8 - 23 mg/dL   Creatinine, Ser 1.43 (H) 0.44 - 1.00 mg/dL   Calcium 8.7 (L) 8.9 - 10.3 mg/dL   Total Protein 7.2 6.5 - 8.1 g/dL   Albumin 3.7 3.5 - 5.0 g/dL   AST 64 (H) 15 - 41 U/L   ALT 21 0 - 44 U/L   Alkaline Phosphatase 68 38 - 126 U/L   Total Bilirubin 0.7 0.3 - 1.2 mg/dL   GFR calc non Af Amer 33 (L) >60 mL/min   GFR calc  Af Amer 38 (L) >60 mL/min   Anion gap 11 5 - 15    Comment: Performed at [facility], [address], [place], [place] [address]  CBC with Differential/Platelet     Status: Abnormal   Collection Time: 02/01/19  8:15 AM  Result Value Ref Range   WBC 7.7 4.0 - 10.5 K/uL   RBC 3.38 (L) 3.87 - 5.11 MIL/uL   Hemoglobin 10.7 (L) 12.0 - 15.0 g/dL   HCT 33.2 (L) 36.0 - 46.0 %   MCV 98.2 80.0 - 100.0 fL   MCH 31.7 26.0 - 34.0 pg   MCHC 32.2 30.0 - 36.0 g/dL   RDW 14.4 11.5 - 15.5 %   Platelets 174 150 - 400 K/uL   nRBC 0.0 0.0 - 0.2 %   Neutrophils Relative % 45 %   Neutro Abs 3.4 1.7 - 7.7 K/uL   Lymphocytes Relative 46 %   Lymphs Abs 3.5 0.7 - 4.0 K/uL   Monocytes Relative 7 %   Monocytes Absolute 0.6 0.1 - 1.0 K/uL   Eosinophils Relative 1 %   Eosinophils Absolute 0.1 0.0 - 0.5 K/uL   Basophils Relative 0 %   Basophils Absolute 0.0 0.0 - 0.1 K/uL   Immature Granulocytes 1 %   Abs Immature Granulocytes 0.07 0.00 - 0.07 K/uL    Comment: Performed at [facility], [address], [place], [place] [address]  Troponin I - Once     Status: None   Collection Time: 02/01/19  8:15 AM  Result Value Ref Range   Troponin I <0.03 <0.03 ng/mL    Comment: Performed at [facility], [address], [place], [place] [address]  Brain natriuretic peptide     Status: None   Collection Time: 02/01/19  9:06 AM  Result Value Ref Range   B Natriuretic Peptide 84.0 0.0 - 100.0 pg/mL    Comment: Performed at [facility], [address] [place], [place] [address]   Dg Chest Port 1 View  Result Date: 02/01/2019 CLINICAL DATA:  Chest pain EXAM: PORTABLE CHEST 1 VIEW COMPARISON:  03/24/17 FINDINGS: Cardiac shadow is prominent. Aortic calcifications are again seen. The lungs are well aerated bilaterally without focal infiltrate. Mild vascular congestion is seen. No bony abnormality is noted. IMPRESSION: Mild vascular congestion without interstitial edema. Electronically Signed   By: Linus Mako.D.  On:  02/01/2019 08:33    Pending Labs Unresulted Labs (From admission, onward)    Start     Ordered   02/01/19 0954  SARS Coronavirus 2 (CEPHEID - Performed in [place] hospital lab), [place]  (Asymptomatic Patients Labs)  Once,   R    Question:  Rule Out  Answer:  Yes   02/01/19 0953   Signed and Held  Troponin I - Now Then Q3H  Now then every 3 hours,   TIMED     Signed and Held   Signed and Held  CBC  (enoxaparin (LOVENOX)    CrCl < 30 ml/min)  Once,   R    Comments:  Baseline for enoxaparin therapy IF NOT ALREADY DRAWN.  Notify MD if PLT < 100 K.    Signed and Held   Signed and Held  Creatinine, serum  (enoxaparin (LOVENOX)    CrCl < 30 ml/min)  Once,   R    Comments:  Baseline for enoxaparin therapy IF NOT ALREADY DRAWN.    Signed and Held   Signed and Held  Creatinine, serum  (enoxaparin (LOVENOX)    CrCl < 30 ml/min)  Weekly,   R    Comments:  while on enoxaparin therapy.    Signed and Held   Signed and Held  Lipid panel  Tomorrow morning,   R     Signed and Held   Signed and Held  Hemoglobin A1c  Add-on,   STAT     Signed and Held   Signed and Held  TSH  Add-on,   STAT     Signed and Held          Vitals/Pain Today's Vitals   02/01/19 0830 02/01/19 0900 02/01/19 0930 02/01/19 1030  BP: (!) 151/68 (!) 153/70 (!) 154/70 (!) 160/86  Pulse:      Resp: 14 17 16 16   Temp:      TempSrc:      SpO2:      Weight:      Height:      PainSc:        Isolation Precautions No active isolations  Medications Medications - No data to display  Mobility Non-ambulatory Moderate fall risk   Focused Assessments    R Recommendations: See Admitting Provider Note  Report given to:   Additional Notes:

## 2019-02-01 NOTE — H&P (Signed)
History and Physical  Laura Davenport JOI:[id number] DOB: Sep 23, 1930 DOA: 02/01/2019  Referring physician: Rogene Houston PCP: [name], MD   Chief Complaint: chest pain  Historian: pt with dementia, history taken from EMS records, ED provider and medical staff notes, old medical records.  HPI: Laura Davenport is a 85 y.o. female with hypertension, diabetes mellitus, hyperlipidemia, stage III CKD, anemia, dementia and other medical history detailed below presented to ED by EMS complaining of chest pain and feeling lightheaded.  The patient had apparently been doing her daily activities at home when she suddenly started to have chest pressure and became diaphoretic.  Her symptoms lasted about 15 minutes.  The patient was seen by EMS and the fire department who gave her an aspirin.  The patient had no nausea or vomiting.  The patient denies cough.  No known COVID-19 exposures.  No headache and no diarrhea.  No abdominal pain.  ED course: The patient did not have acute ischemic EKG changes.  The patient had a troponin less than 0.03.  Room air sat 99%.  Normal cardiac BNP.  Chest x-ray with vascular congestion but no interstitial edema.  Patient's chest pain symptoms have completely resolved.  The patient is being admitted for observation to rule out acute myocardial ischemia.  Review of Systems: UTO due to dementia.  Past Medical History:  Diagnosis Date  . Anemia in chronic kidney disease 01/20/2016  . B12 deficiency anemia 10/25/2015  . Chronic renal disease, stage 3, moderately decreased glomerular filtration rate (GFR) between 30-59 mL/min/1.73 square meter ([place]) 01/07/2016  . Depression   . Essential hypertension   . GI bleed    Severe gastritis and duodenal ulcers by EGD May 2016   . HLD (hyperlipidemia)   . PSVT (paroxysmal supraventricular tachycardia) ([place])   . Septic arthritis (HCC)    MRSA, left knee   . Type 2 diabetes mellitus ([place])    Past Surgical History:  Procedure Laterality  Date  . CHOLECYSTECTOMY N/A 11/04/2012   Procedure: LAPAROSCOPIC CHOLECYSTECTOMY;  Surgeon: Jamesetta So, MD;  Location: [facility];  Service: General;  Laterality: N/A;  . ESOPHAGOGASTRODUODENOSCOPY N/A 01/06/2015   SLF: mild esophagitis  . I&D EXTREMITY Left 12/24/2014   Procedure: IRRIGATION AND DEBRIDEMENT EXTREMITY AND ARTHROSCOPY KNEE;  Surgeon: Renette Butters, MD;  Location: [facility];  Service: Orthopedics;  Laterality: Left;  . KNEE SURGERY    . SAVORY DILATION  01/06/2015   Procedure: SAVORY DILATION;  Surgeon: Danie Binder, MD;  [facility];  Service: Endoscopy;;   Social History:  reports that she has never smoked. She has never used smokeless tobacco. She reports that she does not drink alcohol or use drugs.  Allergies  Allergen Reactions  . Oxycodone Hives    Family History  Family history unknown: Yes    Prior to Admission medications   Medication Sig Start Date End Date Taking? Authorizing Provider  CARTIA XT 240 MG 24 hr capsule Take 240 mg by mouth daily.  11/24/18  Yes [provider]  cyanocobalamin (,VITAMIN B-12,) 1000 MCG/ML injection ADMINISTER 1 ML(1000 MCG) UNDER THE SKIN EVERY 30 DAYS 12/21/16  Yes Holley Bouche, NP  donepezil (ARICEPT) 10 MG tablet Take 10 mg by mouth at bedtime.  05/07/18  Yes [provider]  losartan (COZAAR) 100 MG tablet Take 100 mg by mouth daily.  04/07/15  Yes [provider]  pantoprazole (PROTONIX) 40 MG tablet TAKE 1 TABLET(40 MG) BY MOUTH DAILY  before breakfast 12/21/18  Yes Mahala Menghini, PA-C   Physical Exam: Vitals:   02/01/19 3818 02/01/19 0830 02/01/19 0900 02/01/19 0930  BP:  (!) 151/68 (!) 153/70 (!) 154/70  Pulse:      Resp:  14 17 16   Temp:      TempSrc:      SpO2:      Weight: 65.4 kg     Height: 5\' 3"  (1.6 m)        General exam: Elderly female, awake, alert, with dementia. Cooperative. Moderately built and nourished patient, lying comfortably supine on the gurney in no  obvious distress.  Head, eyes and ENT: Nontraumatic and normocephalic. Pupils equally reacting to light and accommodation. Oral mucosa moist.  Neck: Supple. No JVD, carotid bruit or thyromegaly.  Lymphatics: No lymphadenopathy.  Respiratory system: Clear to auscultation. No increased work of breathing.  Cardiovascular system: normal S1 and S2 heard, RRR. No JVD, murmurs, gallops, clicks or pedal edema.  Gastrointestinal system: Abdomen is nondistended, soft and nontender. Normal bowel sounds heard. No organomegaly or masses appreciated.  Central nervous system: Alert and oriented. No focal neurological deficits.  Extremities: Symmetric 5 x 5 power. Peripheral pulses symmetrically felt.   Skin: No rashes or acute findings.  Musculoskeletal system: Negative exam.  Psychiatry: Pleasant and cooperative.  Labs on Admission:  Basic Metabolic Panel: Recent Labs  Lab 02/01/19 0815  NA 143  K 4.3  CL 111  CO2 21*  GLUCOSE 128*  BUN 23  CREATININE 1.43*  CALCIUM 8.7*   Liver Function Tests: Recent Labs  Lab 02/01/19 0815  AST 64*  ALT 21  ALKPHOS 68  BILITOT 0.7  PROT 7.2  ALBUMIN 3.7   No results for input(s): LIPASE, AMYLASE in the last 168 hours. No results for input(s): AMMONIA in the last 168 hours. CBC: Recent Labs  Lab 02/01/19 0815  WBC 7.7  NEUTROABS 3.4  HGB 10.7*  HCT 33.2*  MCV 98.2  PLT 174   Cardiac Enzymes: Recent Labs  Lab 02/01/19 0815  TROPONINI <0.03    BNP (last 3 results) No results for input(s): PROBNP in the last 8760 hours. CBG: No results for input(s): GLUCAP in the last 168 hours.  Radiological Exams on Admission: Dg Chest Port 1 View  Result Date: 02/01/2019 CLINICAL DATA:  Chest pain EXAM: PORTABLE CHEST 1 VIEW COMPARISON:  03/24/17 FINDINGS: Cardiac shadow is prominent. Aortic calcifications are again seen. The lungs are well aerated bilaterally without focal infiltrate. Mild vascular congestion is seen. No bony abnormality  is noted. IMPRESSION: Mild vascular congestion without interstitial edema. Electronically Signed   By: Inez Catalina M.D.   On: 02/01/2019 08:33    EKG: Independently reviewed. NSR  Assessment/Plan Principal Problem:   Chest pain on exertion Active Problems:   Diabetes mellitus without complication (HCC)   Hypertension   HLD (hyperlipidemia)   Dysphagia   Chronic renal disease, stage 3, moderately decreased glomerular filtration rate (GFR) between 30-59 mL/min/1.73 square meter (HCC)   Anemia in chronic kidney disease   GERD (gastroesophageal reflux disease)   Dementia ([place])   1. Chest pain with exertion - Pt is poor historian but does report that she had chest pressure symptoms and chest pain symptoms associated with diaphoresis after being active doing household chores earlier this morning.  Her symptoms lasted 15 minutes.  She is going to be observed with serial troponins and telemetry monitoring.  Continue aspirin.  Nitro glycerin ordered for chest pain symptoms.  Morphine  ordered as needed for pain, continue oxygen.  Repeat EKG in a.m. 2. GERD-Protonix increased to twice daily. 3. Stage III CKD- her creatinine is actually somewhat improved from last test.  Continue home medications. 4. Diabetes mellitus type 2- diet controlled.  Check hemoglobin A1c. 5. Essential hypertension-resume home blood pressure medications. 6. Dementia-resume home medication. 7. Anemia in CKD- patient had been followed by the heme-onc clinic and will continue to follow-up there.  DVT Prophylaxis: lovenox  Code Status: full   Family Communication: phone update   Disposition Plan: observation    Time spent: 55 minutes    Wynetta Emery, MD [facility] How to contact the [facility] Attending or Consulting provider [place] or covering provider during after hours [place], for this patient?  1. Check the care team in [facility] and look for a) attending/consulting [facility] provider listed and b) the [facility] team listed 2.  Log into [email or web address] and use [place]'s universal password to access. If you do not have the password, please contact the hospital operator. 3. Locate the [facility] provider you are looking for under [facility] and page to a number that you can be directly reached. 4. If you still have difficulty reaching the provider, please page the [facility] (Director on Call) for the Hospitalists listed on amion for assistance.

## 2019-02-01 NOTE — ED Provider Notes (Signed)
[facility] EMERGENCY DEPARTMENT Provider Note   CSN: [id number] Arrival date & time: 02/01/19  0801    History   Chief Complaint Chief Complaint  Patient presents with  . Chest Pain    HPI Laura Davenport is a 85 y.o. female.     Patient with acute onset of chest pain anterior this morning after waking up.  Lasted at least 15 minutes.  Patient given aspirin by EMS.  Room air sats were 99%.  Patient also felt lightheaded with it.  No nausea or vomiting no diaphoresis.  Pain is now resolved.  Patient does have a component of dementia.  Is on Aricept.  Patient denies any fevers or upper respiratory symptoms.     Past Medical History:  Diagnosis Date  . Anemia in chronic kidney disease 01/20/2016  . B12 deficiency anemia 10/25/2015  . Chronic renal disease, stage 3, moderately decreased glomerular filtration rate (GFR) between 30-59 mL/min/1.73 square meter ([place]) 01/07/2016  . Depression   . Essential hypertension   . GI bleed    Severe gastritis and duodenal ulcers by EGD May 2016   . HLD (hyperlipidemia)   . PSVT (paroxysmal supraventricular tachycardia) ([place])   . Septic arthritis (HCC)    MRSA, left knee   . Type 2 diabetes mellitus [facility])     Patient Active Problem List   Diagnosis Date Noted  . Chest pain on exertion 02/01/2019  . Dementia ([place]) 02/01/2019  . GERD (gastroesophageal reflux disease) 12/21/2018  . Constipation 12/21/2018  . Altered mental status 03/24/2017  . Anemia in chronic kidney disease 01/20/2016  . Chronic renal disease, stage 3, moderately decreased glomerular filtration rate (GFR) between 30-59 mL/min/1.73 square meter ([place]) 01/07/2016  . B12 deficiency anemia 10/25/2015  . Dysphagia 03/13/2015  . AP (abdominal pain)   . Hematemesis without nausea   . Atrial fibrillation and flutter ([place])   . AKI (acute kidney injury) ([place])   . Bacteremia   . Essential hypertension   . Septic joint of left knee joint ([place])   . Blood poisoning   . Fever  12/22/2014  . History of fractured kneecap 12/22/2014  . Knee fracture, left 12/22/2014  . Sepsis ([place]) 12/22/2014  . Tachycardia with 141 - 160 beats per minute 12/22/2014  . Diabetes mellitus without complication ([place])   . Hypertension   . HLD (hyperlipidemia)   . Depression     Past Surgical History:  Procedure Laterality Date  . CHOLECYSTECTOMY N/A 11/04/2012   Procedure: LAPAROSCOPIC CHOLECYSTECTOMY;  Surgeon: Jamesetta So, MD;  Location: [facility];  Service: General;  Laterality: N/A;  . ESOPHAGOGASTRODUODENOSCOPY N/A 01/06/2015   SLF: mild esophagitis  . I&D EXTREMITY Left 12/24/2014   Procedure: IRRIGATION AND DEBRIDEMENT EXTREMITY AND ARTHROSCOPY KNEE;  Surgeon: Renette Butters, MD;  Location: [facility];  Service: Orthopedics;  Laterality: Left;  . KNEE SURGERY    . SAVORY DILATION  01/06/2015   Procedure: SAVORY DILATION;  Surgeon: Danie Binder, MD;  [facility];  Service: Endoscopy;;     OB History    Gravida  10   Para  10   Term  10   Preterm      AB      Living  9     SAB      TAB      Ectopic      Multiple      Live Births  Home Medications    Prior to Admission medications   Medication Sig Start Date End Date Taking? Authorizing Provider  CARTIA XT 240 MG 24 hr capsule Take 240 mg by mouth daily.  11/24/18  Yes [provider]  cyanocobalamin (,VITAMIN B-12,) 1000 MCG/ML injection ADMINISTER 1 ML(1000 MCG) UNDER THE SKIN EVERY 30 DAYS 12/21/16  Yes Holley Bouche, NP  donepezil (ARICEPT) 10 MG tablet Take 10 mg by mouth at bedtime.  05/07/18  Yes [provider]  losartan (COZAAR) 100 MG tablet Take 100 mg by mouth daily.  04/07/15  Yes [provider]  pantoprazole (PROTONIX) 40 MG tablet TAKE 1 TABLET(40 MG) BY MOUTH DAILY before breakfast 12/21/18  Yes Mahala Menghini, PA-C    Family History Family History  Family history unknown: Yes    Social History Social History   Tobacco Use  .  Smoking status: Never Smoker  . Smokeless tobacco: Never Used  . Tobacco comment: Never smoked  Substance Use Topics  . Alcohol use: No    Alcohol/week: 0.0 standard drinks  . Drug use: No     Allergies   Oxycodone   Review of Systems Review of Systems  Constitutional: Negative for chills and fever.  HENT: Negative for rhinorrhea and sore throat.   Eyes: Negative for visual disturbance.  Respiratory: Negative for cough and shortness of breath.   Cardiovascular: Positive for chest pain. Negative for leg swelling.  Gastrointestinal: Negative for abdominal pain, diarrhea, nausea and vomiting.  Genitourinary: Negative for dysuria.  Musculoskeletal: Negative for back pain and neck pain.  Skin: Negative for rash.  Neurological: Positive for light-headedness. Negative for dizziness and headaches.  Hematological: Does not bruise/bleed easily.  Psychiatric/Behavioral: Positive for confusion.     Physical Exam Updated Vital Signs BP (!) 154/70   Pulse 95   Temp 98.2 F (36.8 C) (Oral)   Resp 16   Ht 1.6 m (5\' 3" )   Wt 65.4 kg   SpO2 100%   BMI 25.54 kg/m   Physical Exam Vitals signs and nursing note reviewed.  Constitutional:      General: She is not in acute distress.    Appearance: Normal appearance. She is well-developed. She is not diaphoretic.  HENT:     Head: Normocephalic and atraumatic.  Eyes:     Extraocular Movements: Extraocular movements intact.     Conjunctiva/sclera: Conjunctivae normal.     Pupils: Pupils are equal, round, and reactive to light.  Neck:     Musculoskeletal: Normal range of motion and neck supple.  Cardiovascular:     Rate and Rhythm: Normal rate and regular rhythm.     Heart sounds: Normal heart sounds. No murmur.  Pulmonary:     Effort: Pulmonary effort is normal. No respiratory distress.     Breath sounds: Normal breath sounds.  Abdominal:     Palpations: Abdomen is soft.     Tenderness: There is no abdominal tenderness.   Musculoskeletal:        General: No swelling.  Skin:    General: Skin is warm and dry.  Neurological:     General: No focal deficit present.     Mental Status: She is alert. Mental status is at baseline.     Cranial Nerves: No cranial nerve deficit.      ED Treatments / Results  Labs (all labs ordered are listed, but only abnormal results are displayed) Labs Reviewed  COMPREHENSIVE METABOLIC PANEL - Abnormal; Notable for the following components:  Result Value   CO2 21 (*)    Glucose, Bld 128 (*)    Creatinine, Ser 1.43 (*)    Calcium 8.7 (*)    AST 64 (*)    GFR calc non Af Amer 33 (*)    GFR calc Af Amer 38 (*)    All other components within normal limits  CBC WITH DIFFERENTIAL/PLATELET - Abnormal; Notable for the following components:   RBC 3.38 (*)    Hemoglobin 10.7 (*)    HCT 33.2 (*)    All other components within normal limits  SARS CORONAVIRUS 2 (HOSPITAL ORDER, [facility])  TROPONIN I  BRAIN NATRIURETIC PEPTIDE    EKG EKG Interpretation  Date/Time:  Monday February 01 2019 08:07:41 EDT Ventricular Rate:  93 PR Interval:    QRS Duration: 103 QT Interval:  362 QTC Calculation: 451 R Axis:   0 Text Interpretation:  Sinus rhythm Prolonged PR interval Low voltage, precordial leads Interpretation limited secondary to artifact No significant change since last tracing Confirmed by Fredia Sorrow [id number]) on 02/01/2019 8:15:48 AM   Radiology Dg Chest Port 1 View  Result Date: 02/01/2019 CLINICAL DATA:  Chest pain EXAM: PORTABLE CHEST 1 VIEW COMPARISON:  03/24/17 FINDINGS: Cardiac shadow is prominent. Aortic calcifications are again seen. The lungs are well aerated bilaterally without focal infiltrate. Mild vascular congestion is seen. No bony abnormality is noted. IMPRESSION: Mild vascular congestion without interstitial edema. Electronically Signed   By: Inez Catalina M.D.   On: 02/01/2019 08:33    Procedures Procedures (including  critical care time)  Medications Ordered in ED Medications - No data to display   Initial Impression / Assessment and Plan / ED Course  I have reviewed the triage vital signs and the nursing notes.  Pertinent labs & imaging results that were available during my care of the patient were reviewed by me and considered in my medical decision making (see chart for details).        Patient's chest pain has resolved.  It lasted at least 15 minutes just this morning.  Upon wakening.  Patient will require admission rule out discussed with hospitalist and they will rule her out.  No acute findings on EKG chest x-ray or troponin.  Chest x-ray raise some concerns may be for a little bit of vascular congestion.  The patient's BNP was fine.  Hospitalist will admit for chest pain rule out.  Patient is alert here does have some component of dementia.  So there could be some confusion about the duration of the pain.  But it sounds at least it was 15 minutes.  Patient is now asymptomatic.  Final Clinical Impressions(s) / ED Diagnoses   Final diagnoses:  Precordial pain    ED Discharge Orders    None       Fredia Sorrow, MD 02/01/19 1031

## 2019-02-02 DIAGNOSIS — N189 Chronic kidney disease, unspecified: Secondary | ICD-10-CM | POA: Diagnosis not present

## 2019-02-02 DIAGNOSIS — E119 Type 2 diabetes mellitus without complications: Secondary | ICD-10-CM | POA: Diagnosis not present

## 2019-02-02 DIAGNOSIS — E1122 Type 2 diabetes mellitus with diabetic chronic kidney disease: Secondary | ICD-10-CM | POA: Diagnosis not present

## 2019-02-02 DIAGNOSIS — N183 Chronic kidney disease, stage 3 (moderate): Secondary | ICD-10-CM | POA: Diagnosis not present

## 2019-02-02 DIAGNOSIS — R079 Chest pain, unspecified: Secondary | ICD-10-CM | POA: Diagnosis not present

## 2019-02-02 DIAGNOSIS — Z1159 Encounter for screening for other viral diseases: Secondary | ICD-10-CM | POA: Diagnosis not present

## 2019-02-02 DIAGNOSIS — F0390 Unspecified dementia without behavioral disturbance: Secondary | ICD-10-CM | POA: Diagnosis not present

## 2019-02-02 DIAGNOSIS — R0789 Other chest pain: Secondary | ICD-10-CM | POA: Diagnosis not present

## 2019-02-02 DIAGNOSIS — I129 Hypertensive chronic kidney disease with stage 1 through stage 4 chronic kidney disease, or unspecified chronic kidney disease: Secondary | ICD-10-CM | POA: Diagnosis not present

## 2019-02-02 LAB — RENAL FUNCTION PANEL
Albumin: 3.3 g/dL — ABNORMAL LOW (ref 3.5–5.0)
Anion gap: 9 (ref 5–15)
BUN: 22 mg/dL (ref 8–23)
CO2: 21 mmol/L — ABNORMAL LOW (ref 22–32)
Calcium: 8.9 mg/dL (ref 8.9–10.3)
Chloride: 115 mmol/L — ABNORMAL HIGH (ref 98–111)
Creatinine, Ser: 1.31 mg/dL — ABNORMAL HIGH (ref 0.44–1.00)
GFR calc Af Amer: 43 mL/min — ABNORMAL LOW (ref >60)
GFR calc non Af Amer: 37 mL/min — ABNORMAL LOW (ref >60)
Glucose, Bld: 89 mg/dL (ref 70–99)
Phosphorus: 3.6 mg/dL (ref 2.5–4.6)
Potassium: 4 mmol/L (ref 3.5–5.1)
Sodium: 145 mmol/L (ref 135–145)

## 2019-02-02 LAB — URINALYSIS, ROUTINE W REFLEX MICROSCOPIC
Bacteria, UA: NONE SEEN
Bilirubin Urine: NEGATIVE
Glucose, UA: NEGATIVE mg/dL
Ketones, ur: NEGATIVE mg/dL
Nitrite: NEGATIVE
Protein, ur: 100 mg/dL — AB
Specific Gravity, Urine: 1.006 (ref 1.005–1.030)
WBC, UA: >50 WBC/hpf — ABNORMAL HIGH (ref 0–5)
pH: 6 (ref 5.0–8.0)

## 2019-02-02 LAB — CBC
HCT: 32.8 % — ABNORMAL LOW (ref 36.0–46.0)
Hemoglobin: 10.8 g/dL — ABNORMAL LOW (ref 12.0–15.0)
MCH: 32.5 pg (ref 26.0–34.0)
MCHC: 32.9 g/dL (ref 30.0–36.0)
MCV: 98.8 fL (ref 80.0–100.0)
Platelets: 190 10*3/uL (ref 150–400)
RBC: 3.32 MIL/uL — ABNORMAL LOW (ref 3.87–5.11)
RDW: 14.2 % (ref 11.5–15.5)
WBC: 5.7 10*3/uL (ref 4.0–10.5)
nRBC: 0 % (ref 0.0–0.2)

## 2019-02-02 LAB — LIPID PANEL
Cholesterol: 195 mg/dL (ref 0–200)
HDL: 57 mg/dL (ref >40)
LDL Cholesterol: 123 mg/dL — ABNORMAL HIGH (ref 0–99)
Total CHOL/HDL Ratio: 3.4 RATIO
Triglycerides: 73 mg/dL (ref <150)
VLDL: 15 mg/dL (ref 0–40)

## 2019-02-02 MED ORDER — ASPIRIN 81 MG PO TBEC: 81.0000 mg | DELAYED_RELEASE_TABLET | Freq: Every day | ORAL | Status: DC

## 2019-02-02 MED ORDER — FOSFOMYCIN TROMETHAMINE 3 G PO PACK
3.0000 g | PACK | Freq: Once | ORAL | Status: AC
  Administered 2019-02-02: 3 g via ORAL
  Filled 2019-02-02: qty 3

## 2019-02-02 NOTE — Discharge Summary (Signed)
Physician Discharge Summary  Laura Davenport IOE:[id number] DOB: 08-07-1931 DOA: 02/01/2019  PCP: [name], MD  Admit date: 02/01/2019 Discharge date: 02/02/2019  Admitted From: Home  Disposition: Home   Recommendations for Outpatient Follow-up:  1. Follow up with PCP in 3 days  2. Follow up with cardiology in 1 week  Home Health: PT, RN, SW  Discharge Condition: STABLE   CODE STATUS: FULL    Brief Hospitalization Summary: Please see all hospital notes, images, labs for full details of the hospitalization. HPI: Laura Davenport is a 85 y.o. female with hypertension, diabetes mellitus, hyperlipidemia, stage III CKD, anemia, dementia and other medical history detailed below presented to ED by EMS complaining of chest pain and feeling lightheaded. The patient had apparently been doing her daily activities at home when she suddenly started to have chest pressure and became diaphoretic. Her symptoms lasted about 15 minutes. The patient was seen by EMS and the fire department who gave her an aspirin. The patient had no nausea or vomiting. The patient denies cough. No known COVID-19 exposures. No headache and no diarrhea. No abdominal pain.  ED course: The patient did not have acute ischemic EKG changes. The patient had a troponin less than 0.03. Room air sat 99%. Normal cardiac BNP. Chest x-ray with vascular congestion but no interstitial edema. Patient's chest pain symptoms have completely resolved. The patient is being admitted for observation to rule out acute myocardial ischemia.   The patient was admitted for chest pain observation.  The patient had serial troponins tested that have all been less than 0.03.  Patient has been monitored on continuous telemetry with serial EKGs and has remained stable and no recurrence of symptoms and no signs of ischemic changes.  Patient was also seen by physical therapy who recommended PT.  OT evaluated her and recommended a 3 and 1 chair and shower stool.  This is  been ordered for the patient.  The patient was ordered to have home health PT RN and social work.  The patient is discharging home to have outpatient follow-up with her primary care provider and cardiologist.  She says she sees Dr. Domenic Polite.  I asked her to make an appointment to see the [facility] cardiology clinic.  I Spoke with her daughter.  I am  Having her take aspirin 81 mg daily.  Continue protonix.   Discharge Diagnoses:  Principal Problem:   Chest pain on exertion Active Problems:   Diabetes mellitus without complication (HCC)   Hypertension   HLD (hyperlipidemia)   Dysphagia   Chronic renal disease, stage 3, moderately decreased glomerular filtration rate (GFR) between 30-59 mL/min/1.73 square meter (HCC)   Anemia in chronic kidney disease   GERD (gastroesophageal reflux disease)   Dementia (HCC)   Discharge Instructions: Discharge Instructions    Call MD for:  difficulty breathing, headache or visual disturbances   Complete by:  As directed    Call MD for:  extreme fatigue   Complete by:  As directed    Call MD for:  severe uncontrolled pain   Complete by:  As directed    Increase activity slowly   Complete by:  As directed      Allergies as of 02/02/2019      Reactions   Oxycodone Hives      Medication List    TAKE these medications   aspirin 81 MG EC tablet Take 1 tablet (81 mg total) by mouth daily. Start taking on:  February 03, 2019  Cartia XT 240 MG 24 hr capsule Generic drug:  diltiazem Take 240 mg by mouth daily.   cyanocobalamin 1000 MCG/ML injection Commonly known as:  (VITAMIN B-12) ADMINISTER 1 ML(1000 MCG) UNDER THE SKIN EVERY 30 DAYS   donepezil 10 MG tablet Commonly known as:  ARICEPT Take 10 mg by mouth at bedtime.   losartan 100 MG tablet Commonly known as:  COZAAR Take 100 mg by mouth daily.   pantoprazole 40 MG tablet Commonly known as:  PROTONIX TAKE 1 TABLET(40 MG) BY MOUTH DAILY before breakfast            Durable Medical  Equipment  (From admission, onward)         Start     Ordered   02/02/19 1118  For home use only DME 3 n 1  Once     02/02/19 1117   02/02/19 1118  For home use only DME Shower stool  Once     02/02/19 1117         Follow-up Information    [name], MD. Schedule an appointment as soon as possible for a visit in 3 day(s).   Specialty:  Internal Medicine Why:  Hospital Follow Up  Contact information: [address] [place] [address] [phone]        Erma Heritage, [place]. Schedule an appointment as soon as possible for a visit in 1 week(s).   Specialties:  Physician Assistant, Cardiology Why:  Hospital Follow Up  Contact information: [place] [address] [phone]          Allergies  Allergen Reactions  . Oxycodone Hives   Allergies as of 02/02/2019      Reactions   Oxycodone Hives      Medication List    TAKE these medications   aspirin 81 MG EC tablet Take 1 tablet (81 mg total) by mouth daily. Start taking on:  February 03, 2019   Cartia XT 240 MG 24 hr capsule Generic drug:  diltiazem Take 240 mg by mouth daily.   cyanocobalamin 1000 MCG/ML injection Commonly known as:  (VITAMIN B-12) ADMINISTER 1 ML(1000 MCG) UNDER THE SKIN EVERY 30 DAYS   donepezil 10 MG tablet Commonly known as:  ARICEPT Take 10 mg by mouth at bedtime.   losartan 100 MG tablet Commonly known as:  COZAAR Take 100 mg by mouth daily.   pantoprazole 40 MG tablet Commonly known as:  PROTONIX TAKE 1 TABLET(40 MG) BY MOUTH DAILY before breakfast            Durable Medical Equipment  (From admission, onward)         Start     Ordered   02/02/19 1118  For home use only DME 3 n 1  Once     02/02/19 1117   02/02/19 1118  For home use only DME Shower stool  Once     02/02/19 1117          Procedures/Studies: Dg Chest Port 1 View  Result Date: 02/01/2019 CLINICAL DATA:  Chest pain EXAM: PORTABLE CHEST 1 VIEW COMPARISON:  03/24/17  FINDINGS: Cardiac shadow is prominent. Aortic calcifications are again seen. The lungs are well aerated bilaterally without focal infiltrate. Mild vascular congestion is seen. No bony abnormality is noted. IMPRESSION: Mild vascular congestion without interstitial edema. Electronically Signed   By: Inez Catalina M.D.   On: 02/01/2019 08:33      Subjective: Patient says she is feeling better she  has not had any recurrence of her symptoms.  She is having no shortness of breath.  She is having no chest tightness.  She has been working with physical therapy and remain asymptomatic.  She would like to go home.  She has eaten breakfast and tolerated.  Discharge Exam: Vitals:   02/02/19 0816 02/02/19 0950  BP:  (!) 142/58  Pulse:  66  Resp:  20  Temp:  98.2 F (36.8 C)  SpO2: 100% 100%   Vitals:   02/01/19 2132 02/02/19 0521 02/02/19 0816 02/02/19 0950  BP: (!) 133/52 (!) 141/59  (!) 142/58  Pulse: 64 (!) 54  66  Resp: 20 20  20   Temp: 98.5 F (36.9 C) (!) 97.5 F (36.4 C)  98.2 F (36.8 C)  TempSrc: Oral Oral  Oral  SpO2: 100% 100% 100% 100%  Weight:      Height:       General: elderly female sitting up in chair, NAD, cooperative. Pt is alert, awake, not in acute distress Cardiovascular: RRR, S1/S2 +, no rubs, no gallops Respiratory: CTA bilaterally, no wheezing, no rhonchi Abdominal: Soft, NT, ND, bowel sounds + Extremities: no edema, no cyanosis   The results of significant diagnostics from this hospitalization (including imaging, microbiology, ancillary and laboratory) are listed below for reference.     Microbiology: Recent Results (from the past 240 hour(s))  SARS Coronavirus 2 (CEPHEID - Performed in [place] hospital lab), Hosp Order     Status: None   Collection Time: 02/01/19 10:00 AM  Result Value Ref Range Status   SARS Coronavirus 2 NEGATIVE NEGATIVE Final    Comment: (NOTE) If result is NEGATIVE SARS-CoV-2 target nucleic acids are NOT DETECTED. The SARS-CoV-2  RNA is generally detectable in upper and lower  respiratory specimens during the acute phase of infection. The lowest  concentration of SARS-CoV-2 viral copies this assay can detect is 250  copies / mL. A negative result does not preclude SARS-CoV-2 infection  and should not be used as the sole basis for treatment or other  patient management decisions.  A negative result may occur with  improper specimen collection / handling, submission of specimen other  than nasopharyngeal swab, presence of viral mutation(s) within the  areas targeted by this assay, and inadequate number of viral copies  (<250 copies / mL). A negative result must be combined with clinical  observations, patient history, and epidemiological information. If result is POSITIVE SARS-CoV-2 target nucleic acids are DETECTED. The SARS-CoV-2 RNA is generally detectable in upper and lower  respiratory specimens dur ing the acute phase of infection.  Positive  results are indicative of active infection with SARS-CoV-2.  Clinical  correlation with patient history and other diagnostic information is  necessary to determine patient infection status.  Positive results do  not rule out bacterial infection or co-infection with other viruses. If result is PRESUMPTIVE POSTIVE SARS-CoV-2 nucleic acids MAY BE PRESENT.   A presumptive positive result was obtained on the submitted specimen  and confirmed on repeat testing.  While 2019 novel coronavirus  (SARS-CoV-2) nucleic acids may be present in the submitted sample  additional confirmatory testing may be necessary for epidemiological  and / or clinical management purposes  to differentiate between  SARS-CoV-2 and other Sarbecovirus currently known to infect humans.  If clinically indicated additional testing with an alternate test  methodology [phone]) is advised. The SARS-CoV-2 RNA is generally  detectable in upper and lower respiratory sp ecimens during the acute  phase  of  infection. The expected result is Negative. Fact Sheet for Patients:  StrictlyIdeas.no Fact Sheet for Healthcare Providers: BankingDealers.co.za This test is not yet approved or cleared by the [place] FDA and has been authorized for detection and/or diagnosis of SARS-CoV-2 by FDA under an Emergency Use Authorization (EUA).  This EUA will remain in effect (meaning this test can be used) for the duration of the COVID-19 declaration under Section 564(b)(1) of the Act, 21 U.S.C. section 360bbb-3(b)(1), unless the authorization is terminated or revoked sooner. Performed at [facility], [address] [place], [place] [address]      Labs: BNP (last 3 results) Recent Labs    02/01/19 0906  BNP 82.9   Basic Metabolic Panel: Recent Labs  Lab 02/01/19 0815 02/02/19 0428  NA 143 145  K 4.3 4.0  CL 111 115*  CO2 21* 21*  GLUCOSE 128* 89  BUN 23 22  CREATININE 1.43* 1.31*  CALCIUM 8.7* 8.9  PHOS  --  3.6   Liver Function Tests: Recent Labs  Lab 02/01/19 0815 02/02/19 0428  AST 64*  --   ALT 21  --   ALKPHOS 68  --   BILITOT 0.7  --   PROT 7.2  --   ALBUMIN 3.7 3.3*   No results for input(s): LIPASE, AMYLASE in the last 168 hours. No results for input(s): AMMONIA in the last 168 hours. CBC: Recent Labs  Lab 02/01/19 0815 02/02/19 0428  WBC 7.7 5.7  NEUTROABS 3.4  --   HGB 10.7* 10.8*  HCT 33.2* 32.8*  MCV 98.2 98.8  PLT 174 190   Cardiac Enzymes: Recent Labs  Lab 02/01/19 0815 02/01/19 1223 02/01/19 1458 02/01/19 1756  TROPONINI <0.03 <0.03 <0.03 <0.03   BNP: Invalid input(s): POCBNP CBG: No results for input(s): GLUCAP in the last 168 hours. D-Dimer No results for input(s): DDIMER in the last 72 hours. Hgb A1c Recent Labs    02/01/19 1148  HGBA1C 5.5   Lipid Profile Recent Labs    02/02/19 0428  CHOL 195  HDL 57  LDLCALC 123*  TRIG 73  CHOLHDL 3.4   Thyroid function studies Recent  Labs    02/01/19 1148  TSH 3.378   Anemia work up No results for input(s): VITAMINB12, FOLATE, FERRITIN, TIBC, IRON, RETICCTPCT in the last 72 hours. Urinalysis    Component Value Date/Time   COLORURINE YELLOW 02/02/2019 0438   APPEARANCEUR TURBID (A) 02/02/2019 0438   LABSPEC 1.006 02/02/2019 0438   PHURINE 6.0 02/02/2019 0438   GLUCOSEU NEGATIVE 02/02/2019 0438   HGBUR MODERATE (A) 02/02/2019 0438   BILIRUBINUR NEGATIVE 02/02/2019 0438   KETONESUR NEGATIVE 02/02/2019 0438   PROTEINUR 100 (A) 02/02/2019 0438   UROBILINOGEN 0.2 01/17/2015 1000   NITRITE NEGATIVE 02/02/2019 0438   LEUKOCYTESUR MODERATE (A) 02/02/2019 0438   Sepsis Labs Invalid input(s): PROCALCITONIN,  WBC,  LACTICIDVEN Microbiology Recent Results (from the past 240 hour(s))  SARS Coronavirus 2 (CEPHEID - Performed in [place] hospital lab), Hosp Order     Status: None   Collection Time: 02/01/19 10:00 AM  Result Value Ref Range Status   SARS Coronavirus 2 NEGATIVE NEGATIVE Final    Comment: (NOTE) If result is NEGATIVE SARS-CoV-2 target nucleic acids are NOT DETECTED. The SARS-CoV-2 RNA is generally detectable in upper and lower  respiratory specimens during the acute phase of infection. The lowest  concentration of SARS-CoV-2 viral copies this assay can detect is 250  copies / mL. A negative result does  not preclude SARS-CoV-2 infection  and should not be used as the sole basis for treatment or other  patient management decisions.  A negative result may occur with  improper specimen collection / handling, submission of specimen other  than nasopharyngeal swab, presence of viral mutation(s) within the  areas targeted by this assay, and inadequate number of viral copies  (<250 copies / mL). A negative result must be combined with clinical  observations, patient history, and epidemiological information. If result is POSITIVE SARS-CoV-2 target nucleic acids are DETECTED. The SARS-CoV-2 RNA is generally  detectable in upper and lower  respiratory specimens dur ing the acute phase of infection.  Positive  results are indicative of active infection with SARS-CoV-2.  Clinical  correlation with patient history and other diagnostic information is  necessary to determine patient infection status.  Positive results do  not rule out bacterial infection or co-infection with other viruses. If result is PRESUMPTIVE POSTIVE SARS-CoV-2 nucleic acids MAY BE PRESENT.   A presumptive positive result was obtained on the submitted specimen  and confirmed on repeat testing.  While 2019 novel coronavirus  (SARS-CoV-2) nucleic acids may be present in the submitted sample  additional confirmatory testing may be necessary for epidemiological  and / or clinical management purposes  to differentiate between  SARS-CoV-2 and other Sarbecovirus currently known to infect humans.  If clinically indicated additional testing with an alternate test  methodology [phone]) is advised. The SARS-CoV-2 RNA is generally  detectable in upper and lower respiratory sp ecimens during the acute  phase of infection. The expected result is Negative. Fact Sheet for Patients:  StrictlyIdeas.no Fact Sheet for Healthcare Providers: BankingDealers.co.za This test is not yet approved or cleared by the [place] FDA and has been authorized for detection and/or diagnosis of SARS-CoV-2 by FDA under an Emergency Use Authorization (EUA).  This EUA will remain in effect (meaning this test can be used) for the duration of the COVID-19 declaration under Section 564(b)(1) of the Act, 21 U.S.C. section 360bbb-3(b)(1), unless the authorization is terminated or revoked sooner. Performed at [facility], [address] [place], [place] [address]     Time coordinating discharge:   SIGNED:  Irwin Brakeman, MD  [facility] 02/02/2019, 11:21 AM How to contact the [facility] Attending or  Consulting provider [place] or covering provider during after hours [place], for this patient?  1. Check the care team in [facility] and look for a) attending/consulting TR[facility] provider listed and b) the [facility] team listed 2. Log into [email or web address] and use [place]'s universal password to access. If you do not have the password, please contact the hospital operator. 3. Locate the [facility] provider you are looking for under [facility] and page to a number that you can be directly reached. 4. If you still have difficulty reaching the provider, please page the [facility] (Director on Call) for the Hospitalists listed on amion for assistance.

## 2019-02-02 NOTE — Care Management Obs Status (Signed)
[facility] NOTIFICATION   Patient Details  Name: DECARLA SIEMEN MRN: [id number] Date of Birth: 1931-01-15   Medicare Observation Status Notification Given:  Yes    Evania Lyne, Chauncey [place], RN 02/02/2019, 11:33 AM

## 2019-02-02 NOTE — Progress Notes (Signed)
Nsg Discharge Note  Admit Date:  02/01/2019 Discharge date: 02/02/2019   Margaretha Seeds to be D/C'd Home per MD order.  AVS completed.  Copy for chart, and copy for patient signed, and dated. caregiver Danny Zimny (daughter) able to verbalize understanding.  Discharge Medication: Allergies as of 02/02/2019      Reactions   Oxycodone Hives      Medication List    TAKE these medications   aspirin 81 MG EC tablet Take 1 tablet (81 mg total) by mouth daily. Start taking on:  February 03, 2019   Cartia XT 240 MG 24 hr capsule Generic drug:  diltiazem Take 240 mg by mouth daily.   cyanocobalamin 1000 MCG/ML injection Commonly known as:  (VITAMIN B-12) ADMINISTER 1 ML(1000 MCG) UNDER THE SKIN EVERY 30 DAYS   donepezil 10 MG tablet Commonly known as:  ARICEPT Take 10 mg by mouth at bedtime.   losartan 100 MG tablet Commonly known as:  COZAAR Take 100 mg by mouth daily.   pantoprazole 40 MG tablet Commonly known as:  PROTONIX TAKE 1 TABLET(40 MG) BY MOUTH DAILY before breakfast            Durable Medical Equipment  (From admission, onward)         Start     Ordered   02/02/19 1118  For home use only DME 3 n 1  Once     02/02/19 1117   02/02/19 1118  For home use only DME Shower stool  Once     02/02/19 1117          Discharge Assessment: Vitals:   02/02/19 0816 02/02/19 0950  BP:  (!) 142/58  Pulse:  66  Resp:  20  Temp:  98.2 F (36.8 C)  SpO2: 100% 100%   Skin clean, dry and intact without evidence of skin break down, no evidence of skin tears noted. IV catheter discontinued intact. Site without signs and symptoms of complications - no redness or edema noted at insertion site, patient denies c/o pain - only slight tenderness at site.  Dressing with slight pressure applied.  D/c Instructions-Education: Discharge instructions given to patient's daughter Jennamarie Goings with verbalized understanding. D/c education completed with patient's daughter Nashaly An  Therrell  including follow up instructions, medication list, d/c activities limitations if indicated, with other d/c instructions as indicated by MD - patient able to verbalize understanding, all questions fully answered. Patient's daughter instructed to return to ED, call 911, or call MD for any changes in condition.  Patient escorted via [place], and D/C home via private auto.  Catlynn Grondahl C, RN 02/02/2019 1:10 PM

## 2019-02-02 NOTE — Evaluation (Signed)
Occupational Therapy Evaluation Patient Details Name: Laura Davenport MRN: [id number] DOB: 12/10/1930 Today's Date: 02/02/2019    History of Present Illness Laura Davenport is a 85 y.o. female with hypertension, diabetes mellitus, hyperlipidemia, stage III CKD, anemia, dementia and other medical history detailed below presented to ED by EMS complaining of chest pain and feeling lightheaded.  The patient had apparently been doing her daily activities at home when she suddenly started to have chest pressure and became diaphoretic.  Her symptoms lasted about 15 minutes.  The patient was seen by EMS and the fire department who gave her an aspirin.  The patient had no nausea or vomiting.  The patient denies cough.  No known COVID-19 exposures.  No headache and no diarrhea.  No abdominal pain.   Clinical Impression   Pt very pleasant this am, oriented to person and place, does not remember why she is here. Pt denies symptoms this am, able to participate in evaluation without difficulty. History is questionable as pt with dementia therefore unsure if all PLOF information is correct. Pt performing seated ADLs without difficulty, reports she does not stand for tasks at home due to her bad knee, therefore stays in wheelchair most of the time. Pt appears to be at her baseline functioning, no further OT services required at this time.     Follow Up Recommendations  No OT follow up;Supervision/Assistance - 24 hour    Equipment Recommendations  3 in 1 bedside commode;Tub/shower seat       Precautions / Restrictions Precautions Precautions: Fall Restrictions Weight Bearing Restrictions: No      Mobility Bed Mobility Overal bed mobility: Modified Independent                Transfers Overall transfer level: Needs assistance Equipment used: 1 person hand held assist Transfers: Sit to/from [facility] Sit to Stand: Min guard Stand pivot transfers: Min guard                 ADL either performed or assessed with clinical judgement   ADL Overall ADL's : Needs assistance/impaired Eating/Feeding: Modified independent;Sitting   Grooming: Wash/dry hands;Wash/dry face;Brushing hair;Set up;Sitting Grooming Details (indicate cue type and reason): OT provided items, pt able to perform tasks without difficulty in seated position             Lower Body Dressing: Supervision/safety;Sitting/lateral leans Lower Body Dressing Details (indicate cue type and reason): pt donned socks while seated on EOB without difficulty Toilet Transfer: Min [employer]er, jewellery Details (indicate cue type and reason): simulated with bed to chair transfer                 Vision Baseline Vision/History: No visual deficits Patient Visual Report: No change from baseline Vision Assessment?: No apparent visual deficits            Pertinent Vitals/Pain Pain Assessment: No/denies pain     Hand Dominance Right   Extremity/Trunk Assessment Upper Extremity Assessment Upper Extremity Assessment: Overall WFL for tasks assessed(BUE strength 4/5 throughout)   Lower Extremity Assessment Lower Extremity Assessment: Defer to PT evaluation   Cervical / Trunk Assessment Cervical / Trunk Assessment: Normal   Communication Communication Communication: No difficulties   Cognition Arousal/Alertness: Awake/alert Behavior During Therapy: WFL for tasks assessed/performed Overall Cognitive Status: Within Functional Limits for tasks assessed  Home Living Family/patient expects to be discharged to:: Private residence Living Arrangements: Children Available Help at Discharge: Family;Available 24 hours/day Type of Home: House Home Access: Stairs to enter [employer] of Steps: 2 Entrance Stairs-Rails: None Home Layout: One level     Bathroom Shower/Tub: Occupational psychologist:  Standard     Home Equipment: Wheelchair - [employer] - 2 wheels;Cane - single point          Prior Functioning/Environment Level of Independence: Needs assistance  Gait / Transfers Assistance Needed: per pt reports she primarily uses a wheelchair, occasionally uses RW for transfer tasks.  ADL's / Homemaking Assistance Needed: daughter assists with housekeeping,meal preparation, supervision during ADLs for pt's cognition            OT Problem List: Decreased activity tolerance       AM-PAC OT "6 Clicks" Daily Activity     Outcome Measure Help from another person eating meals?: None Help from another person taking care of personal grooming?: A Little Help from another person toileting, which includes using toliet, bedpan, or urinal?: A Little Help from another person bathing (including washing, rinsing, drying)?: A Little Help from another person to put on and taking off regular upper body clothing?: None Help from another person to put on and taking off regular lower body clothing?: A Little 6 Click Score: 20   End of Session Equipment Utilized During Treatment: Gait belt  Activity Tolerance: Patient tolerated treatment well Patient left: in chair;with call bell/phone within reach;with chair alarm set  OT Visit Diagnosis: Muscle weakness (generalized) (M62.81)                Time: 1025-8527 OT Time Calculation (min): 30 min Charges:  OT General Charges $OT Visit: 1 Visit OT Evaluation $OT Eval Low Complexity: [place], OTR/L  [phone] 02/02/2019, 8:10 AM

## 2019-02-02 NOTE — TOC Transition Note (Signed)
Transition of Care [facility]) - CM/SW Discharge Note   Patient Details  Name: Laura Davenport MRN: [id number] Date of Birth: 1931-07-19  Transition of Care [facility]) CM/SW Contact:  Laura Davenport, Laura Reading, RN Phone Number: 02/02/2019, 11:24 AM   Clinical Narrative:   Patient discharging home today. Discussed HH PT with patient and daughter, both agreeable. No preference on agency. Star ratings discussed with patient, will send referral to [facility] with [facility]. Patient has RW, WC, and BSC at home.   Expected Discharge Plan: [place] Barriers to Discharge: No Barriers Identified   Patient Goals and CMS Choice Patient states their goals for this hospitalization and ongoing recovery are:: go home with family and get stronger CMS Medicare.gov Compare Post Acute Care list provided to:: Patient(and dtr- Laura Davenport) Choice offered to / list presented to : Patient  Expected Discharge Plan and Services Expected Discharge Plan: [place]   Discharge Planning Services: CM Consult Post Acute Care Choice: Home Health   Expected Discharge Date: 02/02/19                         HH Arranged: PT HH Agency: [place] ([place]) Date [place]: 02/02/19 Time [place]: 1120 Representative spoke with at [place]: Laura Davenport   Prior Living Arrangements/Services   Lives with:: Adult Children Patient language and need for interpreter reviewed:: Yes Do you feel safe going back to the place where you live?: Yes      Need for Family Participation in Patient Care: Yes (Comment) Care giver support system in place?: Yes (comment) Current home services: DME(RW, WC, BSC) Criminal Activity/Legal Involvement Pertinent to Current Situation/Hospitalization: No - Comment as needed  Activities of Daily Living Home Assistive Devices/Equipment: Wheelchair ADL Screening (condition at time of admission) Patient's cognitive ability adequate to safely complete  daily activities?: Yes Is the patient deaf or have difficulty hearing?: No Does the patient have difficulty seeing, even when wearing glasses/contacts?: No Does the patient have difficulty concentrating, remembering, or making decisions?: No Patient able to express need for assistance with ADLs?: Yes Does the patient have difficulty dressing or bathing?: No Independently performs ADLs?: Yes (appropriate for developmental age) Does the patient have difficulty walking or climbing stairs?: No Weakness of Legs: None Weakness of Arms/Hands: None  Permission Sought/Granted   Permission granted to share information with : Yes, Verbal Permission Granted     Permission granted to share info w AGENCY: [facility]        Emotional Assessment Appearance:: Appears stated age   Affect (typically observed): Accepting Orientation: : Oriented to Self, Oriented to Place, Oriented to  Time      Admission diagnosis:  Precordial pain [R07.2] Patient Active Problem List   Diagnosis Date Noted  . Chest pain on exertion 02/01/2019  . Dementia ([place]) 02/01/2019  . GERD (gastroesophageal reflux disease) 12/21/2018  . Constipation 12/21/2018  . Altered mental status 03/24/2017  . Anemia in chronic kidney disease 01/20/2016  . Chronic renal disease, stage 3, moderately decreased glomerular filtration rate (GFR) between 30-59 mL/min/1.73 square meter ([place]) 01/07/2016  . B12 deficiency anemia 10/25/2015  . Dysphagia 03/13/2015  . AP (abdominal pain)   . Hematemesis without nausea   . Atrial fibrillation and flutter ([place])   . AKI (acute kidney injury) ([place])   . Bacteremia   . Essential hypertension   . Septic joint of left knee joint ([place])   .  Blood poisoning   . Fever 12/22/2014  . History of fractured kneecap 12/22/2014  . Knee fracture, left 12/22/2014  . Sepsis ([place]) 12/22/2014  . Tachycardia with 141 - 160 beats per minute 12/22/2014  . Diabetes mellitus without complication ([place])    . Hypertension   . HLD (hyperlipidemia)   . Depression    PCP:  Laura Noble, MD Pharmacy:   [facility], [address] [place] [address] Phone: [phone] Fax: [phone]     Social Determinants of Health (SDOH) Interventions    Readmission Risk Interventions No flowsheet data found.    Final next level of care: [place] Barriers to Discharge: No Barriers Identified   Patient Goals and CMS Choice Patient states their goals for this hospitalization and ongoing recovery are:: go home with family and get stronger CMS Medicare.gov Compare Post Acute Care list provided to:: Patient(and dtr- Laura Davenport) Choice offered to / list presented to : Patient  Discharge Placement                       Discharge Plan and Services   Discharge Planning Services: CM Consult Post Acute Care Choice: Home Health                    HH Arranged: PT [facility] Agency: [place] ([facility]) Date [place]: 02/02/19 Time [place]: 1120 Representative spoke with at [place]: [place] Determinants of Health ([place]) Interventions     Readmission Risk Interventions No flowsheet data found.

## 2019-02-02 NOTE — Plan of Care (Signed)
  Problem: Acute Rehab PT Goals(only PT should resolve) Goal: Pt Will Go Supine/Side To Sit Outcome: Progressing Flowsheets (Taken 02/02/2019 1049) Pt will go Supine/Side to Sit: Independently Goal: Patient Will Transfer Sit To/From Stand Outcome: Progressing Flowsheets (Taken 02/02/2019 1049) Patient will transfer sit to/from stand: with supervision Goal: Pt Will Transfer Bed To Chair/Chair To Bed Outcome: Progressing Flowsheets (Taken 02/02/2019 1049) Pt will Transfer Bed to Chair/Chair to Bed: with supervision Goal: Pt Will Ambulate Outcome: Progressing Flowsheets (Taken 02/02/2019 1049) Pt will Ambulate: 50 feet; with supervision; with rolling walker   10:49 AM, 02/02/19 Laura Davenport, MPT Physical Therapist with [facility] [phone] office [phone] mobile phone

## 2019-02-02 NOTE — Evaluation (Signed)
Physical Therapy Evaluation Patient Details Name: Laura Davenport MRN: [id number] DOB: 03/24/1931 Today's Date: 02/02/2019   History of Present Illness  Laura Davenport is a 85 y.o. female with hypertension, diabetes mellitus, hyperlipidemia, stage III CKD, anemia, dementia and other medical history detailed below presented to ED by EMS complaining of chest pain and feeling lightheaded.  The patient had apparently been doing her daily activities at home when she suddenly started to have chest pressure and became diaphoretic.  Her symptoms lasted about 15 minutes.  The patient was seen by EMS and the fire department who gave her an aspirin.  The patient had no nausea or vomiting.  The patient denies cough.  No known COVID-19 exposures.  No headache and no diarrhea.  No abdominal pain.    Clinical Impression  Patient functioning near baseline for functional mobility and gait, demonstrates good return for getting into/out of bed without assistance, slightly unsteady on feet with limited weightbearing on LLE when taking steps which is baseline per patient, 1 near fall during transfer to bed mostly due to LLE giving way, able to ambulate in hallway without loss of balance and continued sitting up in chair after therapy.  Patient will benefit from continued physical therapy in hospital and recommended venue below to increase strength, balance, endurance for safe ADLs and gait.     Follow Up Recommendations Home health PT;Supervision for mobility/OOB;Supervision - Intermittent    Equipment Recommendations  None recommended by PT    Recommendations for Other Services       Precautions / Restrictions Precautions Precautions: Fall Restrictions Weight Bearing Restrictions: No      Mobility  Bed Mobility Overal bed mobility: Modified Independent             General bed mobility comments: slightly increased time  Transfers Overall transfer level: Needs assistance Equipment used: Rolling  walker (2 wheeled) Transfers: Sit to/from [facility] Sit to Stand: Min guard Stand pivot transfers: Min guard       General transfer comment: had 1 episode of loss of balance when transferring to bed due to LLE giving way  Ambulation/Gait Ambulation/Gait assistance: Min guard Gait Distance (Feet): 30 Feet Assistive device: Rolling walker (2 wheeled) Gait Pattern/deviations: Decreased step length - right;Decreased step length - left;Decreased stride length Gait velocity: decreased   General Gait Details: slow labored cadence with limited weightbearing on LLE due to old surgery (baseline per patient), no loss of balance, limited secondary to c/o fatigue  Stairs            Wheelchair Mobility    Modified Rankin (Stroke Patients Only)       Balance Overall balance assessment: Needs assistance Sitting-balance support: Feet supported;No upper extremity supported Sitting balance-Leahy Scale: Good     Standing balance support: Bilateral upper extremity supported;During functional activity Standing balance-Leahy Scale: Fair Standing balance comment: using RW                             Pertinent Vitals/Pain Pain Assessment: No/denies pain    Home Living Family/patient expects to be discharged to:: Private residence Living Arrangements: Children Available Help at Discharge: Family;Available 24 hours/day Type of Home: Apartment Home Access: Stairs to enter Entrance Stairs-Rails: Right Entrance Stairs-Number of Steps: 2 Home Layout: One level Home Equipment: Wheelchair - manual;Cane - single point;Walker - 4 wheels;Bedside commode      Prior Function Level of Independence: Needs assistance   Gait /  Transfers Assistance Needed: household ambulator with Rollator, uses wheelchair for longer distances  ADL's / Homemaking Assistance Needed: assisted by family        Hand Dominance   Dominant Hand: Right    Extremity/Trunk Assessment    Upper Extremity Assessment Upper Extremity Assessment: Defer to OT evaluation    Lower Extremity Assessment Lower Extremity Assessment: Generalized weakness    Cervical / Trunk Assessment Cervical / Trunk Assessment: Normal  Communication   Communication: No difficulties  Cognition Arousal/Alertness: Awake/alert Behavior During Therapy: WFL for tasks assessed/performed Overall Cognitive Status: Within Functional Limits for tasks assessed                                        General Comments      Exercises     Assessment/Plan    PT Assessment Patient needs continued PT services  PT Problem List Decreased strength;Decreased activity tolerance;Decreased balance;Decreased mobility       PT Treatment Interventions Therapeutic exercise;Gait training;Stair training;Functional mobility training;Therapeutic activities;Patient/family education    PT Goals (Current goals can be found in the Care Plan section)  Acute Rehab PT Goals Patient Stated Goal: return home PT Goal Formulation: With patient Time For Goal Achievement: 02/05/19 Potential to Achieve Goals: Good    Frequency Min 3X/week   Barriers to discharge        Co-evaluation               AM-PAC PT "6 Clicks" Mobility  Outcome Measure Help needed turning from your back to your side while in a flat bed without using bedrails?: None Help needed moving from lying on your back to sitting on the side of a flat bed without using bedrails?: None Help needed moving to and from a bed to a chair (including a wheelchair)?: A Little Help needed standing up from a chair using your arms (e.g., wheelchair or bedside chair)?: A Little Help needed to walk in hospital room?: A Little Help needed climbing 3-5 steps with a railing? : A Little 6 Click Score: 20    End of Session   Activity Tolerance: Patient tolerated treatment well;Patient limited by fatigue Patient left: in chair;with call bell/phone  within reach Nurse Communication: Mobility status PT Visit Diagnosis: Unsteadiness on feet (R26.81);Other abnormalities of gait and mobility (R26.89);Muscle weakness (generalized) (M62.81)    Time: 7096-2836 PT Time Calculation (min) (ACUTE ONLY): 31 min   Charges:   PT Evaluation $PT Eval Moderate Complexity: 1 Mod PT Treatments $Therapeutic Activity: 23-37 mins        10:48 AM, 02/02/19 Lonell Grandchild, MPT Physical Therapist with [facility] [phone] office [phone] mobile phone

## 2019-02-03 DIAGNOSIS — D631 Anemia in chronic kidney disease: Secondary | ICD-10-CM | POA: Diagnosis not present

## 2019-02-03 DIAGNOSIS — E1122 Type 2 diabetes mellitus with diabetic chronic kidney disease: Secondary | ICD-10-CM | POA: Diagnosis not present

## 2019-02-03 DIAGNOSIS — N183 Chronic kidney disease, stage 3 (moderate): Secondary | ICD-10-CM | POA: Diagnosis not present

## 2019-02-03 DIAGNOSIS — I129 Hypertensive chronic kidney disease with stage 1 through stage 4 chronic kidney disease, or unspecified chronic kidney disease: Secondary | ICD-10-CM | POA: Diagnosis not present

## 2019-02-03 DIAGNOSIS — R1310 Dysphagia, unspecified: Secondary | ICD-10-CM | POA: Diagnosis not present

## 2019-02-03 DIAGNOSIS — R079 Chest pain, unspecified: Secondary | ICD-10-CM | POA: Diagnosis not present

## 2019-02-03 DIAGNOSIS — Z9181 History of falling: Secondary | ICD-10-CM | POA: Diagnosis not present

## 2019-02-03 DIAGNOSIS — K219 Gastro-esophageal reflux disease without esophagitis: Secondary | ICD-10-CM | POA: Diagnosis not present

## 2019-02-03 DIAGNOSIS — D510 Vitamin B12 deficiency anemia due to intrinsic factor deficiency: Secondary | ICD-10-CM | POA: Diagnosis not present

## 2019-02-03 DIAGNOSIS — E785 Hyperlipidemia, unspecified: Secondary | ICD-10-CM | POA: Diagnosis not present

## 2019-02-04 DIAGNOSIS — N183 Chronic kidney disease, stage 3 (moderate): Secondary | ICD-10-CM | POA: Diagnosis not present

## 2019-02-04 DIAGNOSIS — K219 Gastro-esophageal reflux disease without esophagitis: Secondary | ICD-10-CM | POA: Diagnosis not present

## 2019-02-04 DIAGNOSIS — R1310 Dysphagia, unspecified: Secondary | ICD-10-CM | POA: Diagnosis not present

## 2019-02-04 DIAGNOSIS — D631 Anemia in chronic kidney disease: Secondary | ICD-10-CM | POA: Diagnosis not present

## 2019-02-04 DIAGNOSIS — E1122 Type 2 diabetes mellitus with diabetic chronic kidney disease: Secondary | ICD-10-CM | POA: Diagnosis not present

## 2019-02-04 DIAGNOSIS — R079 Chest pain, unspecified: Secondary | ICD-10-CM | POA: Diagnosis not present

## 2019-02-04 DIAGNOSIS — E785 Hyperlipidemia, unspecified: Secondary | ICD-10-CM | POA: Diagnosis not present

## 2019-02-04 DIAGNOSIS — Z9181 History of falling: Secondary | ICD-10-CM | POA: Diagnosis not present

## 2019-02-04 DIAGNOSIS — D510 Vitamin B12 deficiency anemia due to intrinsic factor deficiency: Secondary | ICD-10-CM | POA: Diagnosis not present

## 2019-02-04 DIAGNOSIS — I129 Hypertensive chronic kidney disease with stage 1 through stage 4 chronic kidney disease, or unspecified chronic kidney disease: Secondary | ICD-10-CM | POA: Diagnosis not present

## 2019-02-05 DIAGNOSIS — I10 Essential (primary) hypertension: Secondary | ICD-10-CM | POA: Diagnosis not present

## 2019-02-05 DIAGNOSIS — R079 Chest pain, unspecified: Secondary | ICD-10-CM | POA: Diagnosis not present

## 2019-02-06 DIAGNOSIS — D510 Vitamin B12 deficiency anemia due to intrinsic factor deficiency: Secondary | ICD-10-CM | POA: Diagnosis not present

## 2019-02-06 DIAGNOSIS — D631 Anemia in chronic kidney disease: Secondary | ICD-10-CM | POA: Diagnosis not present

## 2019-02-06 DIAGNOSIS — K219 Gastro-esophageal reflux disease without esophagitis: Secondary | ICD-10-CM | POA: Diagnosis not present

## 2019-02-06 DIAGNOSIS — R1310 Dysphagia, unspecified: Secondary | ICD-10-CM | POA: Diagnosis not present

## 2019-02-06 DIAGNOSIS — E1122 Type 2 diabetes mellitus with diabetic chronic kidney disease: Secondary | ICD-10-CM | POA: Diagnosis not present

## 2019-02-06 DIAGNOSIS — N183 Chronic kidney disease, stage 3 (moderate): Secondary | ICD-10-CM | POA: Diagnosis not present

## 2019-02-06 DIAGNOSIS — E785 Hyperlipidemia, unspecified: Secondary | ICD-10-CM | POA: Diagnosis not present

## 2019-02-06 DIAGNOSIS — R079 Chest pain, unspecified: Secondary | ICD-10-CM | POA: Diagnosis not present

## 2019-02-06 DIAGNOSIS — I129 Hypertensive chronic kidney disease with stage 1 through stage 4 chronic kidney disease, or unspecified chronic kidney disease: Secondary | ICD-10-CM | POA: Diagnosis not present

## 2019-02-06 DIAGNOSIS — Z9181 History of falling: Secondary | ICD-10-CM | POA: Diagnosis not present

## 2019-02-09 ENCOUNTER — Encounter (HOSPITAL_COMMUNITY): Payer: Self-pay

## 2019-02-09 ENCOUNTER — Inpatient Hospital Stay (HOSPITAL_COMMUNITY)
Admission: EM | Admit: 2019-02-09 | Discharge: 2019-02-12 | DRG: 871 | Disposition: A | Discharge status: Home / Self Care | Payer: Medicare Other | Attending: Internal Medicine | Admitting: Internal Medicine

## 2019-02-09 ENCOUNTER — Emergency Department (HOSPITAL_COMMUNITY): Payer: Medicare Other

## 2019-02-09 ENCOUNTER — Other Ambulatory Visit: Payer: Self-pay

## 2019-02-09 DIAGNOSIS — E785 Hyperlipidemia, unspecified: Secondary | ICD-10-CM | POA: Diagnosis not present

## 2019-02-09 DIAGNOSIS — D510 Vitamin B12 deficiency anemia due to intrinsic factor deficiency: Secondary | ICD-10-CM | POA: Diagnosis not present

## 2019-02-09 DIAGNOSIS — K219 Gastro-esophageal reflux disease without esophagitis: Secondary | ICD-10-CM | POA: Diagnosis not present

## 2019-02-09 DIAGNOSIS — A4150 Gram-negative sepsis, unspecified: Secondary | ICD-10-CM | POA: Diagnosis not present

## 2019-02-09 DIAGNOSIS — R509 Fever, unspecified: Secondary | ICD-10-CM

## 2019-02-09 DIAGNOSIS — B9689 Other specified bacterial agents as the cause of diseases classified elsewhere: Secondary | ICD-10-CM | POA: Diagnosis present

## 2019-02-09 DIAGNOSIS — N170 Acute kidney failure with tubular necrosis: Secondary | ICD-10-CM | POA: Diagnosis not present

## 2019-02-09 DIAGNOSIS — E1122 Type 2 diabetes mellitus with diabetic chronic kidney disease: Secondary | ICD-10-CM | POA: Diagnosis not present

## 2019-02-09 DIAGNOSIS — R079 Chest pain, unspecified: Secondary | ICD-10-CM | POA: Diagnosis not present

## 2019-02-09 DIAGNOSIS — N179 Acute kidney failure, unspecified: Secondary | ICD-10-CM | POA: Diagnosis not present

## 2019-02-09 DIAGNOSIS — R531 Weakness: Secondary | ICD-10-CM

## 2019-02-09 DIAGNOSIS — R410 Disorientation, unspecified: Secondary | ICD-10-CM

## 2019-02-09 DIAGNOSIS — A419 Sepsis, unspecified organism: Secondary | ICD-10-CM | POA: Diagnosis not present

## 2019-02-09 DIAGNOSIS — F0390 Unspecified dementia without behavioral disturbance: Secondary | ICD-10-CM | POA: Diagnosis present

## 2019-02-09 DIAGNOSIS — N189 Chronic kidney disease, unspecified: Secondary | ICD-10-CM | POA: Diagnosis present

## 2019-02-09 DIAGNOSIS — L819 Disorder of pigmentation, unspecified: Secondary | ICD-10-CM | POA: Diagnosis not present

## 2019-02-09 DIAGNOSIS — N183 Chronic kidney disease, stage 3 unspecified: Secondary | ICD-10-CM | POA: Diagnosis present

## 2019-02-09 DIAGNOSIS — I10 Essential (primary) hypertension: Secondary | ICD-10-CM | POA: Diagnosis not present

## 2019-02-09 DIAGNOSIS — D631 Anemia in chronic kidney disease: Secondary | ICD-10-CM | POA: Diagnosis present

## 2019-02-09 DIAGNOSIS — N3000 Acute cystitis without hematuria: Secondary | ICD-10-CM | POA: Diagnosis present

## 2019-02-09 DIAGNOSIS — I129 Hypertensive chronic kidney disease with stage 1 through stage 4 chronic kidney disease, or unspecified chronic kidney disease: Secondary | ICD-10-CM | POA: Diagnosis present

## 2019-02-09 DIAGNOSIS — N390 Urinary tract infection, site not specified: Secondary | ICD-10-CM | POA: Diagnosis present

## 2019-02-09 DIAGNOSIS — Z20828 Contact with and (suspected) exposure to other viral communicable diseases: Secondary | ICD-10-CM | POA: Diagnosis present

## 2019-02-09 DIAGNOSIS — Z9181 History of falling: Secondary | ICD-10-CM | POA: Diagnosis not present

## 2019-02-09 DIAGNOSIS — Z8711 Personal history of peptic ulcer disease: Secondary | ICD-10-CM | POA: Diagnosis not present

## 2019-02-09 DIAGNOSIS — Z7982 Long term (current) use of aspirin: Secondary | ICD-10-CM | POA: Diagnosis not present

## 2019-02-09 DIAGNOSIS — Z79899 Other long term (current) drug therapy: Secondary | ICD-10-CM | POA: Diagnosis not present

## 2019-02-09 DIAGNOSIS — D649 Anemia, unspecified: Secondary | ICD-10-CM | POA: Diagnosis present

## 2019-02-09 DIAGNOSIS — R1310 Dysphagia, unspecified: Secondary | ICD-10-CM | POA: Diagnosis not present

## 2019-02-09 LAB — COMPREHENSIVE METABOLIC PANEL
ALT: 14 U/L (ref 0–44)
AST: 17 U/L (ref 15–41)
Albumin: 3.8 g/dL (ref 3.5–5.0)
Alkaline Phosphatase: 65 U/L (ref 38–126)
Anion gap: 14 (ref 5–15)
BUN: 30 mg/dL — ABNORMAL HIGH (ref 8–23)
CO2: 18 mmol/L — ABNORMAL LOW (ref 22–32)
Calcium: 8.6 mg/dL — ABNORMAL LOW (ref 8.9–10.3)
Chloride: 107 mmol/L (ref 98–111)
Creatinine, Ser: 1.93 mg/dL — ABNORMAL HIGH (ref 0.44–1.00)
GFR calc Af Amer: 27 mL/min — ABNORMAL LOW (ref >60)
GFR calc non Af Amer: 23 mL/min — ABNORMAL LOW (ref >60)
Glucose, Bld: 157 mg/dL — ABNORMAL HIGH (ref 70–99)
Potassium: 4.9 mmol/L (ref 3.5–5.1)
Sodium: 139 mmol/L (ref 135–145)
Total Bilirubin: 1.4 mg/dL — ABNORMAL HIGH (ref 0.3–1.2)
Total Protein: 7.7 g/dL (ref 6.5–8.1)

## 2019-02-09 LAB — CBC WITH DIFFERENTIAL/PLATELET
Abs Immature Granulocytes: 0.02 10*3/uL (ref 0.00–0.07)
Basophils Absolute: 0 10*3/uL (ref 0.0–0.1)
Basophils Relative: 0 %
Eosinophils Absolute: 0 10*3/uL (ref 0.0–0.5)
Eosinophils Relative: 0 %
HCT: 32.8 % — ABNORMAL LOW (ref 36.0–46.0)
Hemoglobin: 10.9 g/dL — ABNORMAL LOW (ref 12.0–15.0)
Immature Granulocytes: 0 %
Lymphocytes Relative: 10 %
Lymphs Abs: 0.9 10*3/uL (ref 0.7–4.0)
MCH: 31.6 pg (ref 26.0–34.0)
MCHC: 33.2 g/dL (ref 30.0–36.0)
MCV: 95.1 fL (ref 80.0–100.0)
Monocytes Absolute: 0.8 10*3/uL (ref 0.1–1.0)
Monocytes Relative: 9 %
Neutro Abs: 7.3 10*3/uL (ref 1.7–7.7)
Neutrophils Relative %: 81 %
Platelets: 159 10*3/uL (ref 150–400)
RBC: 3.45 MIL/uL — ABNORMAL LOW (ref 3.87–5.11)
RDW: 13.9 % (ref 11.5–15.5)
WBC: 9.1 10*3/uL (ref 4.0–10.5)
nRBC: 0 % (ref 0.0–0.2)

## 2019-02-09 LAB — URINALYSIS, ROUTINE W REFLEX MICROSCOPIC
Bilirubin Urine: NEGATIVE
Glucose, UA: NEGATIVE mg/dL
Ketones, ur: NEGATIVE mg/dL
Nitrite: POSITIVE — AB
Protein, ur: 100 mg/dL — AB
Specific Gravity, Urine: 1.015 (ref 1.005–1.030)
WBC, UA: >50 WBC/hpf — ABNORMAL HIGH (ref 0–5)
pH: 6 (ref 5.0–8.0)

## 2019-02-09 LAB — SODIUM, URINE, RANDOM: Sodium, Ur: 66 mmol/L

## 2019-02-09 LAB — LACTIC ACID, PLASMA
Lactic Acid, Venous: 1.1 mmol/L (ref 0.5–1.9)
Lactic Acid, Venous: 1.1 mmol/L (ref 0.5–1.9)

## 2019-02-09 LAB — PROTIME-INR
INR: 1.1 (ref 0.8–1.2)
Prothrombin Time: 14.4 seconds (ref 11.4–15.2)

## 2019-02-09 LAB — CREATININE, URINE, RANDOM: Creatinine, Urine: 149.82 mg/dL

## 2019-02-09 LAB — SARS CORONAVIRUS 2 BY RT PCR (HOSPITAL ORDER, PERFORMED IN ~~LOC~~ HOSPITAL LAB): SARS Coronavirus 2: NEGATIVE

## 2019-02-09 MED ORDER — ACETAMINOPHEN 325 MG PO TABS: 650.0000 mg | ORAL_TABLET | Freq: Four times a day (QID) | ORAL | Status: DC | PRN

## 2019-02-09 MED ORDER — ACETAMINOPHEN 650 MG RE SUPP: 650.0000 mg | Freq: Four times a day (QID) | RECTAL | Status: DC | PRN

## 2019-02-09 MED ORDER — ONDANSETRON HCL 4 MG PO TABS: 4.0000 mg | ORAL_TABLET | Freq: Four times a day (QID) | ORAL | Status: DC | PRN

## 2019-02-09 MED ORDER — SODIUM CHLORIDE 0.9 % IV SOLN: 250.0000 mL | INTRAVENOUS | Status: DC | PRN

## 2019-02-09 MED ORDER — DILTIAZEM HCL ER COATED BEADS 240 MG PO CP24
240.0000 mg | ORAL_CAPSULE | Freq: Every day | ORAL | Status: DC
  Administered 2019-02-10 – 2019-02-12 (×2): 240 mg via ORAL
  Filled 2019-02-09 (×3): qty 1

## 2019-02-09 MED ORDER — SODIUM CHLORIDE 0.9 % IV BOLUS (SEPSIS): 1500.0000 mL | Freq: Once | INTRAVENOUS | Status: DC

## 2019-02-09 MED ORDER — HEPARIN SODIUM (PORCINE) 5000 UNIT/ML IJ SOLN
5000.0000 [IU] | Freq: Three times a day (TID) | INTRAMUSCULAR | Status: DC
  Administered 2019-02-09 – 2019-02-12 (×8): 5000 [IU] via SUBCUTANEOUS
  Filled 2019-02-09 (×8): qty 1

## 2019-02-09 MED ORDER — SODIUM CHLORIDE 0.9 % IV SOLN
1.0000 g | Freq: Once | INTRAVENOUS | Status: AC
  Administered 2019-02-09: 1 g via INTRAVENOUS
  Filled 2019-02-09: qty 10

## 2019-02-09 MED ORDER — PANTOPRAZOLE SODIUM 40 MG PO TBEC
40.0000 mg | DELAYED_RELEASE_TABLET | Freq: Every morning | ORAL | Status: DC
  Administered 2019-02-10 – 2019-02-12 (×3): 40 mg via ORAL
  Filled 2019-02-09 (×3): qty 1

## 2019-02-09 MED ORDER — ACETAMINOPHEN 500 MG PO TABS
1000.0000 mg | ORAL_TABLET | Freq: Once | ORAL | Status: AC
  Administered 2019-02-09: 1000 mg via ORAL
  Filled 2019-02-09: qty 2

## 2019-02-09 MED ORDER — ASPIRIN EC 81 MG PO TBEC
81.0000 mg | DELAYED_RELEASE_TABLET | Freq: Every day | ORAL | Status: DC
  Administered 2019-02-10 – 2019-02-12 (×3): 81 mg via ORAL
  Filled 2019-02-09 (×3): qty 1

## 2019-02-09 MED ORDER — SODIUM CHLORIDE 0.9 % IV SOLN: 1.0000 g | INTRAVENOUS | Status: DC

## 2019-02-09 MED ORDER — SODIUM CHLORIDE 0.9% FLUSH: 3.0000 mL | INTRAVENOUS | Status: DC | PRN

## 2019-02-09 MED ORDER — SENNOSIDES-DOCUSATE SODIUM 8.6-50 MG PO TABS: 1.0000 | ORAL_TABLET | Freq: Every evening | ORAL | Status: DC | PRN

## 2019-02-09 MED ORDER — SODIUM CHLORIDE 0.9 % IV BOLUS
750.0000 mL | Freq: Once | INTRAVENOUS | Status: AC
  Administered 2019-02-09: 750 mL via INTRAVENOUS

## 2019-02-09 MED ORDER — SODIUM CHLORIDE 0.9% FLUSH
3.0000 mL | Freq: Two times a day (BID) | INTRAVENOUS | Status: DC
  Administered 2019-02-09 – 2019-02-12 (×4): 3 mL via INTRAVENOUS

## 2019-02-09 MED ORDER — SODIUM CHLORIDE 0.9% FLUSH
3.0000 mL | Freq: Once | INTRAVENOUS | Status: AC
  Administered 2019-02-09: 3 mL via INTRAVENOUS

## 2019-02-09 MED ORDER — SODIUM CHLORIDE 0.9 % IV BOLUS
500.0000 mL | Freq: Once | INTRAVENOUS | Status: AC
  Administered 2019-02-09: 500 mL via INTRAVENOUS

## 2019-02-09 MED ORDER — ONDANSETRON HCL 4 MG/2ML IJ SOLN: 4.0000 mg | Freq: Four times a day (QID) | INTRAMUSCULAR | Status: DC | PRN

## 2019-02-09 MED ORDER — DONEPEZIL HCL 5 MG PO TABS
10.0000 mg | ORAL_TABLET | Freq: Every day | ORAL | Status: DC
  Administered 2019-02-09 – 2019-02-11 (×3): 10 mg via ORAL
  Filled 2019-02-09 (×4): qty 2

## 2019-02-09 NOTE — ED Notes (Signed)
Spoke with Rebecka (daughter) at [phone].

## 2019-02-09 NOTE — ED Provider Notes (Signed)
Emergency Department Provider Note   I have reviewed the triage vital signs and the nursing notes.   HISTORY  Chief Complaint Fever   HPI Laura Davenport is a 85 y.o. female with past medical history outlined below presents to the emergency department for evaluation of fever.  Patient lives with her daughter.  Patient states she started feeling poorly yesterday.  She describes some mild dyspnea and cough.  She also has some mild abdominal discomfort.  She denies any diarrhea or vomiting.  Denies any dysuria, hesitancy, urgency.  She was recently admitted to the hospital and discharged 1 week ago.  No known sick contacts.  She denies any headache.  She has not felt confused.  She does feel some generalized weakness but denies any unilateral symptoms.   Past Medical History:  Diagnosis Date  . Anemia in chronic kidney disease 01/20/2016  . B12 deficiency anemia 10/25/2015  . Chronic renal disease, stage 3, moderately decreased glomerular filtration rate (GFR) between 30-59 mL/min/1.73 square meter ([place]) 01/07/2016  . Depression   . Essential hypertension   . GI bleed    Severe gastritis and duodenal ulcers by EGD May 2016   . HLD (hyperlipidemia)   . PSVT (paroxysmal supraventricular tachycardia) ([place])   . Septic arthritis (HCC)    MRSA, left knee   . Type 2 diabetes mellitus [facility])     Patient Active Problem List   Diagnosis Date Noted  . Sepsis secondary to UTI ([place]) 02/09/2019  . Acute renal failure superimposed on stage 3 chronic kidney disease ([place]) 02/09/2019  . Chest pain on exertion 02/01/2019  . Dementia ([place]) 02/01/2019  . GERD (gastroesophageal reflux disease) 12/21/2018  . Constipation 12/21/2018  . Altered mental status 03/24/2017  . Anemia in chronic kidney disease 01/20/2016  . Chronic renal disease, stage 3, moderately decreased glomerular filtration rate (GFR) between 30-59 mL/min/1.73 square meter ([place]) 01/07/2016  . B12 deficiency anemia 10/25/2015  .  Dysphagia 03/13/2015  . AP (abdominal pain)   . Hematemesis without nausea   . Atrial fibrillation and flutter ([place])   . AKI (acute kidney injury) ([place])   . Bacteremia   . Essential hypertension   . Septic joint of left knee joint ([place])   . Blood poisoning   . Fever 12/22/2014  . History of fractured kneecap 12/22/2014  . Knee fracture, left 12/22/2014  . Sepsis ([place]) 12/22/2014  . Tachycardia with 141 - 160 beats per minute 12/22/2014  . Diabetes mellitus without complication ([place])   . Hypertension   . HLD (hyperlipidemia)   . Depression     Past Surgical History:  Procedure Laterality Date  . CHOLECYSTECTOMY N/A 11/04/2012   Procedure: LAPAROSCOPIC CHOLECYSTECTOMY;  Surgeon: Jamesetta So, MD;  Location: [facility];  Service: General;  Laterality: N/A;  . ESOPHAGOGASTRODUODENOSCOPY N/A 01/06/2015   SLF: mild esophagitis  . I&D EXTREMITY Left 12/24/2014   Procedure: IRRIGATION AND DEBRIDEMENT EXTREMITY AND ARTHROSCOPY KNEE;  Surgeon: Renette Butters, MD;  Location: [facility];  Service: Orthopedics;  Laterality: Left;  . KNEE SURGERY    . SAVORY DILATION  01/06/2015   Procedure: SAVORY DILATION;  Surgeon: Danie Binder, MD;  Location: [facility];  Service: Endoscopy;;    Allergies Oxycodone  Family History  Family history unknown: Yes    Social History Social History   Tobacco Use  . Smoking status: Never Smoker  . Smokeless tobacco: Never Used  . Tobacco comment: Never smoked  Substance Use Topics  .  Alcohol use: No    Alcohol/week: 0.0 standard drinks  . Drug use: No    Review of Systems  Constitutional: Positive fever/chills Eyes: No visual changes. ENT: No sore throat. Cardiovascular: Denies chest pain. Respiratory: Positive shortness of breath/cough.  Gastrointestinal: No abdominal pain.  No nausea, no vomiting.  No diarrhea.  No constipation. Genitourinary: Negative for dysuria. Musculoskeletal: Negative for back pain. Skin: Negative for rash.  Neurological: Negative for headaches, focal weakness or numbness.  10-point ROS otherwise negative.  ____________________________________________   PHYSICAL EXAM:  VITAL SIGNS: ED Triage Vitals [02/09/19 1531]  Enc Vitals Group     BP (!) 131/107     Pulse Rate 96     Resp 18     Temp (!) 103.2 F (39.6 C)     Temp Source Oral     SpO2 100 %     Weight 139 lb 1.8 oz (63.1 kg)     Height 5\' 3"  (1.6 m)   Constitutional: Alert. Well appearing and in no acute distress. Eyes: Conjunctivae are normal.  Head: Atraumatic. Nose: No congestion/rhinnorhea. Mouth/Throat: Mucous membranes are moist.  Neck: No stridor.   Cardiovascular: Normal rate, regular rhythm. Good peripheral circulation. Grossly normal heart sounds.   Respiratory: Normal respiratory effort.  No retractions. Lungs CTAB. Gastrointestinal: Soft and nontender. No distention.  Musculoskeletal: No lower extremity tenderness nor edema. No gross deformities of extremities. Neurologic:  Normal speech and language. No gross focal neurologic deficits are appreciated.  Skin:  Skin is warm, dry and intact. No rash noted.  ____________________________________________   LABS (all labs ordered are listed, but only abnormal results are displayed)  Labs Reviewed  COMPREHENSIVE METABOLIC PANEL - Abnormal; Notable for the following components:      Result Value   CO2 18 (*)    Glucose, Bld 157 (*)    BUN 30 (*)    Creatinine, Ser 1.93 (*)    Calcium 8.6 (*)    Total Bilirubin 1.4 (*)    GFR calc non Af Amer 23 (*)    GFR calc Af Amer 27 (*)    All other components within normal limits  CBC WITH DIFFERENTIAL/PLATELET - Abnormal; Notable for the following components:   RBC 3.45 (*)    Hemoglobin 10.9 (*)    HCT 32.8 (*)    All other components within normal limits  URINALYSIS, ROUTINE W REFLEX MICROSCOPIC - Abnormal; Notable for the following components:   [place] TURBID (*)    Hgb urine dipstick SMALL (*)     Protein, ur 100 (*)    Nitrite POSITIVE (*)    Leukocytes,Ua LARGE (*)    WBC, UA >50 (*)    Bacteria, UA MANY (*)    All other components within normal limits  BASIC METABOLIC PANEL - Abnormal; Notable for the following components:   Glucose, Bld 128 (*)    BUN 31 (*)    Creatinine, Ser 1.79 (*)    Calcium 8.2 (*)    GFR calc non Af Amer 25 (*)    GFR calc Af Amer 29 (*)    All other components within normal limits  CBC WITH DIFFERENTIAL/PLATELET - Abnormal; Notable for the following components:   RBC 2.89 (*)    Hemoglobin 9.7 (*)    HCT 27.9 (*)    Platelets 141 (*)    All other components within normal limits  GLUCOSE, CAPILLARY - Abnormal; Notable for the following components:   Glucose-Capillary 110 (*)  All other components within normal limits  CULTURE, BLOOD (ROUTINE X 2)  CULTURE, BLOOD (ROUTINE X 2)  SARS CORONAVIRUS 2 (HOSPITAL ORDER, [place] [facility])  URINE CULTURE  LACTIC ACID, PLASMA  LACTIC ACID, PLASMA  PROTIME-INR  SODIUM, URINE, RANDOM  CREATININE, URINE, RANDOM   ____________________________________________  EKG   EKG Interpretation  Date/Time:  Tuesday February 09 2019 15:52:16 EDT Ventricular Rate:  85 PR Interval:    QRS Duration: 89 QT Interval:  367 QTC Calculation: 437 R Axis:   -12 Text Interpretation:  Sinus rhythm Abnormal R-wave progression, early transition No STEMI  Confirmed by Nanda Quinton [phone]) on 02/09/2019 4:05:14 PM       ____________________________________________  RADIOLOGY  Dg Chest Port 1 View  Result Date: 02/09/2019 CLINICAL DATA:  Sepsis EXAM: PORTABLE CHEST 1 VIEW COMPARISON:  02/01/2019 FINDINGS: The heart size and mediastinal contours are within normal limits. Both lungs are clear. The visualized skeletal structures are unremarkable. IMPRESSION: No active disease. Electronically Signed   By: Kathreen Devoid   On: 02/09/2019 16:22    ____________________________________________   PROCEDURES   Procedure(s) performed:   Procedures  None ____________________________________________   INITIAL IMPRESSION / ASSESSMENT AND PLAN / ED COURSE  Pertinent labs & imaging results that were available during my care of the patient were reviewed by me and considered in my medical decision making (see chart for details).   Presents to the emergency department for evaluation of fever.  Vital signs are largely unremarkable.  Lower suspicion for sepsis.  No hypotension.  Patient is awake and alert.  She does describe some shortness of breath and cough.  Will send COVID-19 testing along with chest x-ray, cultures, lactate.  Abdomen is diffusely soft and nontender.  No indication for CT imaging at this time.  Plan for Tylenol and reassess pending labs.  Labs reviewed. Patient with UTI. Given Rocephin. No evidence of sepsis. CXR negative. COVID negative.   Discussed patient's case with Hospitalist, Dr. Myna Hidalgo to request admission. Patient and family (if present) updated with plan. Care transferred to Hospitalist service.  I reviewed all nursing notes, vitals, pertinent old records, EKGs, labs, imaging (as available).  ____________________________________________  FINAL CLINICAL IMPRESSION(S) / ED DIAGNOSES  Final diagnoses:  Acute cystitis without hematuria  Generalized weakness  Fever, unspecified fever cause  Disorientation     MEDICATIONS GIVEN DURING THIS VISIT:  Medications  aspirin EC tablet 81 mg (has no administration in time range)  diltiazem (CARDIZEM CD) 24 hr capsule 240 mg (has no administration in time range)  donepezil (ARICEPT) tablet 10 mg (10 mg Oral Given 02/09/19 2333)  pantoprazole (PROTONIX) EC tablet 40 mg (has no administration in time range)  heparin injection 5,000 Units (5,000 Units Subcutaneous Given 02/10/19 0639)  sodium chloride flush (NS) 0.9 % injection 3 mL (3 mLs Intravenous Given 02/09/19 2213)  sodium chloride flush (NS) 0.9 % injection 3 mL (has no  administration in time range)  0.9 %  sodium chloride infusion (has no administration in time range)  acetaminophen (TYLENOL) tablet 650 mg (has no administration in time range)    Or  acetaminophen (TYLENOL) suppository 650 mg (has no administration in time range)  senna-docusate (Senokot-S) tablet 1 tablet (has no administration in time range)  ondansetron (ZOFRAN) tablet 4 mg (has no administration in time range)    Or  ondansetron (ZOFRAN) injection 4 mg (has no administration in time range)  ceFEPIme (MAXIPIME) 2 g in sodium chloride 0.9 % 100  mL IVPB (2 g Intravenous New Bag/Given 02/10/19 0645)  sodium chloride flush (NS) 0.9 % injection 3 mL (3 mLs Intravenous Given 02/09/19 2143)  acetaminophen (TYLENOL) tablet 1,000 mg (1,000 mg Oral Given 02/09/19 1633)  sodium chloride 0.9 % bolus 500 mL (0 mLs Intravenous Stopped 02/09/19 2129)  cefTRIAXone (ROCEPHIN) 1 g in sodium chloride 0.9 % 100 mL IVPB (0 g Intravenous Stopped 02/09/19 2241)  sodium chloride 0.9 % bolus 750 mL (0 mLs Intravenous Stopped 02/09/19 2334)    Note:  This document was prepared using Dragon voice recognition software and may include unintentional dictation errors.  Nanda Quinton, MD Emergency Medicine    Yudith Norlander, [place], MD 02/10/19 [phone]

## 2019-02-09 NOTE — H&P (Signed)
History and Physical    Laura Davenport YDX:[id number] DOB: Jul 03, 1931 DOA: 02/09/2019  PCP: [name], MD   Patient coming from: Home   Chief Complaint: Fever, gen weakness   HPI: Laura Davenport is a 85 y.o. female with medical history significant for dementia, chronic kidney disease stage III, hypertension, GERD, and anemia, now presenting to the emergency department for evaluation of fevers and generalized weakness.  Patient reports that she did not feel well yesterday, has difficulty describing the symptoms with much specificity, but she denies any shortness of breath, cough, abdominal pain, vomiting, or diarrhea.  She feels weak in general but denies any focal numbness or weakness.  Daughter reports that she had a fever today, seemed to be weaker than usual, and maybe a little bit more confused as well.  Patient denies any flank pain, reports intermittent dysuria, and denies shaking chills.  ED Course: Upon arrival to the ED, patient is found to be febrile to 39.6 C, saturating well on room air, and with blood pressure 89/51.  EKG features a sinus rhythm with early R transition.  Chest x-ray is negative for acute cardiopulmonary disease.  Chemistry panel features a bicarbonate of 18 and creatinine 1.93, up from 1.31 few days earlier.  CBC features a stable normocytic anemia.  Lactic acid is reassuringly normal.  Urinalysis is concerning for infection.  Blood cultures were collected, 500 cc normal saline administered, the patient was treated with empiric Rocephin, and hospitalists were asked to admit..   Review of Systems:  All other systems reviewed and apart from HPI, are negative.  Past Medical History:  Diagnosis Date  . Anemia in chronic kidney disease 01/20/2016  . B12 deficiency anemia 10/25/2015  . Chronic renal disease, stage 3, moderately decreased glomerular filtration rate (GFR) between 30-59 mL/min/1.73 square meter ([place]) 01/07/2016  . Depression   . Essential hypertension   . GI  bleed    Severe gastritis and duodenal ulcers by EGD May 2016   . HLD (hyperlipidemia)   . PSVT (paroxysmal supraventricular tachycardia) ([place])   . Septic arthritis (H[facility])    MRSA, left knee   . Type 2 diabetes mellitus ([place])     Past Surgical History:  Procedure Laterality Date  . CHOLECYSTECTOMY N/A 11/04/2012   Procedure: LAPAROSCOPIC CHOLECYSTECTOMY;  Surgeon: Jamesetta So, MD;  Location: [facility];  Service: General;  Laterality: N/A;  . ESOPHAGOGASTRODUODENOSCOPY N/A 01/06/2015   SLF: mild esophagitis  . I&D EXTREMITY Left 12/24/2014   Procedure: IRRIGATION AND DEBRIDEMENT EXTREMITY AND ARTHROSCOPY KNEE;  Surgeon: Renette Butters, MD;  Location: [facility];  Service: Orthopedics;  Laterality: Left;  . KNEE SURGERY    . SAVORY DILATION  01/06/2015   Procedure: SAVORY DILATION;  Surgeon: Danie Binder, MD;  Location: [facility];  Service: Endoscopy;;     reports that she has never smoked. She has never used smokeless tobacco. She reports that she does not drink alcohol or use drugs.  Allergies  Allergen Reactions  . Oxycodone Hives    Family History  Family history unknown: Yes     Prior to Admission medications   Medication Sig Start Date End Date Taking? Authorizing Provider  aspirin EC 81 MG EC tablet Take 1 tablet (81 mg total) by mouth daily. 02/03/19  Yes Johnson, Clanford L, MD  CARTIA XT 240 MG 24 hr capsule Take 240 mg by mouth daily.  11/24/18  Yes [provider]  cyanocobalamin (,VITAMIN B-12,) 1000 MCG/ML injection ADMINISTER  1 ML(1000 MCG) UNDER THE SKIN EVERY 30 DAYS Patient taking differently: Inject 1,000 mcg into the muscle every 30 (thirty) days.  12/21/16  Yes Holley Bouche, NP  donepezil (ARICEPT) 10 MG tablet Take 10 mg by mouth at bedtime.  05/07/18  Yes [provider]  losartan (COZAAR) 100 MG tablet Take 100 mg by mouth every morning.  04/07/15  Yes [provider]  pantoprazole (PROTONIX) 40 MG tablet TAKE 1 TABLET(40 MG) BY  MOUTH DAILY before breakfast Patient taking differently: Take 40 mg by mouth every morning. TAKE 1 TABLET(40 MG) BY MOUTH DAILY before breakfast 12/21/18  Yes Mahala Menghini, PA-C    Physical Exam: Vitals:   02/09/19 1900 02/09/19 2008 02/09/19 2030 02/09/19 2130  BP: (!) 90/57 (!) 104/51 (!) 110/50 125/60  Pulse: 86 67 65 68  Resp: 16 16 19 16   Temp:      TempSrc:      SpO2: 100% 100% 100% 99%  Weight:      Height:        Constitutional: NAD, calm  Eyes: PERTLA, lids and conjunctivae normal ENMT: Mucous membranes are moist. Posterior pharynx clear of any exudate or lesions.   Neck: normal, supple, no masses, no thyromegaly Respiratory: no wheezing, no crackles. No accessory muscle use.  Cardiovascular: S1 & S2 heard, regular rate and rhythm. No extremity edema.   Abdomen: No distension, no tenderness, no masses palpated. Bowel sounds normal.  Musculoskeletal: no clubbing / cyanosis. No joint deformity upper and lower extremities.   Skin: no significant rashes, lesions, ulcers. Warm, dry, well-perfused. Neurologic: No facial asymmetry. Sensation intact. Moving all extremities.  Psychiatric: Alert and oriented to person, place, and situation. Very pleasant, cooperative.    Labs on Admission: I have personally reviewed following labs and imaging studies  CBC: Recent Labs  Lab 02/09/19 1547  WBC 9.1  NEUTROABS 7.3  HGB 10.9*  HCT 32.8*  MCV 95.1  PLT 841   Basic Metabolic Panel: Recent Labs  Lab 02/09/19 1547  NA 139  K 4.9  CL 107  CO2 18*  GLUCOSE 157*  BUN 30*  CREATININE 1.93*  CALCIUM 8.6*   GFR: Estimated Creatinine Clearance: 18.7 mL/min (A) (by C-G formula based on SCr of 1.93 mg/dL (H)). Liver Function Tests: Recent Labs  Lab 02/09/19 1547  AST 17  ALT 14  ALKPHOS 65  BILITOT 1.4*  PROT 7.7  ALBUMIN 3.8   No results for input(s): LIPASE, AMYLASE in the last 168 hours. No results for input(s): AMMONIA in the last 168 hours. Coagulation  Profile: Recent Labs  Lab 02/09/19 1547  INR 1.1   Cardiac Enzymes: No results for input(s): CKTOTAL, CKMB, CKMBINDEX, TROPONINI in the last 168 hours. BNP (last 3 results) No results for input(s): PROBNP in the last 8760 hours. HbA1C: No results for input(s): HGBA1C in the last 72 hours. CBG: No results for input(s): GLUCAP in the last 168 hours. Lipid Profile: No results for input(s): CHOL, HDL, LDLCALC, TRIG, CHOLHDL, LDLDIRECT in the last 72 hours. Thyroid Function Tests: No results for input(s): TSH, T4TOTAL, FREET4, T3FREE, THYROIDAB in the last 72 hours. Anemia Panel: No results for input(s): VITAMINB12, FOLATE, FERRITIN, TIBC, IRON, RETICCTPCT in the last 72 hours. Urine analysis:    Component Value Date/Time   COLORURINE YELLOW 02/09/2019 2100   APPEARANCEUR TURBID (A) 02/09/2019 2100   LABSPEC 1.015 02/09/2019 2100   PHURINE 6.0 02/09/2019 2100   GLUCOSEU NEGATIVE 02/09/2019 2100   HGBUR SMALL (A)  02/09/2019 2100   [place] NEGATIVE 02/09/2019 2100   [place] 02/09/2019 2100   PROTEINUR 100 (A) 02/09/2019 2100   UROBILINOGEN 0.2 01/17/2015 1000   NITRITE POSITIVE (A) 02/09/2019 2100   LEUKOCYTESUR LARGE (A) 02/09/2019 2100   Sepsis Labs: @LABRCNTIP (procalcitonin:4,lacticidven:4) ) Recent Results (from the past 240 hour(s))  SARS Coronavirus 2 (CEPHEID - Performed in [facility] hospital lab), Hosp Order     Status: None   Collection Time: 02/01/19 10:00 AM  Result Value Ref Range Status   SARS Coronavirus 2 NEGATIVE NEGATIVE Final    Comment: (NOTE) If result is NEGATIVE SARS-CoV-2 target nucleic acids are NOT DETECTED. The SARS-CoV-2 RNA is generally detectable in upper and lower  respiratory specimens during the acute phase of infection. The lowest  concentration of SARS-CoV-2 viral copies this assay can detect is 250  copies / mL. A negative result does not preclude SARS-CoV-2 infection  and should not be used as the sole basis for  treatment or other  patient management decisions.  A negative result may occur with  improper specimen collection / handling, submission of specimen other  than nasopharyngeal swab, presence of viral mutation(s) within the  areas targeted by this assay, and inadequate number of viral copies  (<250 copies / mL). A negative result must be combined with clinical  observations, patient history, and epidemiological information. If result is POSITIVE SARS-CoV-2 target nucleic acids are DETECTED. The SARS-CoV-2 RNA is generally detectable in upper and lower  respiratory specimens dur ing the acute phase of infection.  Positive  results are indicative of active infection with SARS-CoV-2.  Clinical  correlation with patient history and other diagnostic information is  necessary to determine patient infection status.  Positive results do  not rule out bacterial infection or co-infection with other viruses. If result is PRESUMPTIVE POSTIVE SARS-CoV-2 nucleic acids MAY BE PRESENT.   A presumptive positive result was obtained on the submitted specimen  and confirmed on repeat testing.  While 2019 novel coronavirus  (SARS-CoV-2) nucleic acids may be present in the submitted sample  additional confirmatory testing may be necessary for epidemiological  and / or clinical management purposes  to differentiate between  SARS-CoV-2 and other Sarbecovirus currently known to infect humans.  If clinically indicated additional testing with an alternate test  methodology [id number]) is advised. The SARS-CoV-2 RNA is generally  detectable in upper and lower respiratory sp ecimens during the acute  phase of infection. The expected result is Negative. Fact Sheet for Patients:  StrictlyIdeas.no Fact Sheet for Healthcare Providers: BankingDealers.co.za This test is not yet approved or cleared by the [place] FDA and has been authorized for detection and/or  diagnosis of SARS-CoV-2 by FDA under an Emergency Use Authorization (EUA).  This EUA will remain in effect (meaning this test can be used) for the duration of the COVID-19 declaration under Section 564(b)(1) of the Act, 21 U.S.C. section 360bbb-3(b)(1), unless the authorization is terminated or revoked sooner. Performed at [facility], [address] [place], [place] [address]   SARS Coronavirus 2 (CEPHEID- Performed in [facility] hospital lab), Hosp Order     Status: None   Collection Time: 02/09/19  3:49 PM  Result Value Ref Range Status   SARS Coronavirus 2 NEGATIVE NEGATIVE Final    Comment: (NOTE) If result is NEGATIVE SARS-CoV-2 target nucleic acids are NOT DETECTED. The SARS-CoV-2 RNA is generally detectable in upper and lower  respiratory specimens during the acute phase of infection. The lowest  concentration of  SARS-CoV-2 viral copies this assay can detect is 250  copies / mL. A negative result does not preclude SARS-CoV-2 infection  and should not be used as the sole basis for treatment or other  patient management decisions.  A negative result may occur with  improper specimen collection / handling, submission of specimen other  than nasopharyngeal swab, presence of viral mutation(s) within the  areas targeted by this assay, and inadequate number of viral copies  (<250 copies / mL). A negative result must be combined with clinical  observations, patient history, and epidemiological information. If result is POSITIVE SARS-CoV-2 target nucleic acids are DETECTED. The SARS-CoV-2 RNA is generally detectable in upper and lower  respiratory specimens dur ing the acute phase of infection.  Positive  results are indicative of active infection with SARS-CoV-2.  Clinical  correlation with patient history and other diagnostic information is  necessary to determine patient infection status.  Positive results do  not rule out bacterial infection or co-infection with other viruses.  If result is PRESUMPTIVE POSTIVE SARS-CoV-2 nucleic acids MAY BE PRESENT.   A presumptive positive result was obtained on the submitted specimen  and confirmed on repeat testing.  While 2019 novel coronavirus  (SARS-CoV-2) nucleic acids may be present in the submitted sample  additional confirmatory testing may be necessary for epidemiological  and / or clinical management purposes  to differentiate between  SARS-CoV-2 and other Sarbecovirus currently known to infect humans.  If clinically indicated additional testing with an alternate test  methodology [phone]) is advised. The SARS-CoV-2 RNA is generally  detectable in upper and lower respiratory sp ecimens during the acute  phase of infection. The expected result is Negative. Fact Sheet for Patients:  StrictlyIdeas.no Fact Sheet for Healthcare Providers: BankingDealers.co.za This test is not yet approved or cleared by the [place] FDA and has been authorized for detection and/or diagnosis of SARS-CoV-2 by FDA under an Emergency Use Authorization (EUA).  This EUA will remain in effect (meaning this test can be used) for the duration of the COVID-19 declaration under Section 564(b)(1) of the Act, 21 U.S.C. section 360bbb-3(b)(1), unless the authorization is terminated or revoked sooner. Performed at [facility], [address], [place], [place] [address]   Culture, blood (Routine x 2)     Status: None (Preliminary result)   Collection Time: 02/09/19  4:01 PM  Result Value Ref Range Status   Specimen Description RIGHT ANTECUBITAL  Final   Special Requests   Final    BOTTLES DRAWN AEROBIC AND ANAEROBIC Blood Culture adequate volume Performed at [facility], [address] [place], [place] [address]    Culture PENDING  Incomplete   Report Status PENDING  Incomplete  Culture, blood (Routine x 2)     Status: None (Preliminary result)   Collection Time: 02/09/19  4:02 PM  Result  Value Ref Range Status   Specimen Description BLOOD RIGHT WRIST  Final   Special Requests   Final    BOTTLES DRAWN AEROBIC AND ANAEROBIC Blood Culture adequate volume Performed at [facility], [address] [place], [place] [address]    Culture PENDING  Incomplete   Report Status PENDING  Incomplete     Radiological Exams on Admission: Dg Chest Port 1 View  Result Date: 02/09/2019 CLINICAL DATA:  Sepsis EXAM: PORTABLE CHEST 1 VIEW COMPARISON:  02/01/2019 FINDINGS: The heart size and mediastinal contours are within normal limits. Both lungs are clear. The visualized skeletal structures are unremarkable. IMPRESSION: No active disease. Electronically  Signed   By: Kathreen Devoid   On: 02/09/2019 16:22    EKG: Independently reviewed. Sinus rhythm, early R-transition.   Assessment/Plan   1. Sepsis secondary to UTI  - Presents with fevers, found to have SBP 98, reassuringly normal lactate, clear CXR, negative COVID-19, and UA concerning for infection  - Blood cultures collected in ED, 500 cc NS given, and empiric Rocephin administered  - Add-on urine culture, continue IVF, continue [place], follow cultures and clinical course    2. Acute kidney injury superimposed on CKD III  - SCr is 1.93 on admission, up from 1.31 last week  - Likely prerenal azotemia in setting of infection with low BP, developing ATN possible  - Continue fluid-resuscitation, check urine chemistries, renally-dose medications, avoid nephrotoxins, repeat chem panel in am    3. Hypertension  - SBP 89 in ED in setting of acute infection, improving with IVF - Hold losartan until renal function stabilizes and resume diltiazem in am with holding parameters    4. Anemia  - Hgb is stable on admission with no active bleeding  - Follows with heme-onc, treated with B12 injenctions and previously with Aranesp    5. Dementia  - Continue Aricept    PPE: Mask, face shield  DVT prophylaxis: sq heparin  Code Status: Full   Family Communication: Discussed with patient  Consults called: None Admission status: Observation     Vianne Bulls, MD [facility] Pager [phone]  If 7PM-7AM, please contact night-coverage [email or web address] Password [facility]  02/09/2019, 9:49 PM

## 2019-02-09 NOTE — ED Triage Notes (Signed)
Pt presents to ED with complaints of fever. Pt fever 102.3 today by Home Health Therapist. Pt daughter denies any other symptoms. Pt temp in triage 103.2.

## 2019-02-10 ENCOUNTER — Other Ambulatory Visit: Payer: Self-pay

## 2019-02-10 DIAGNOSIS — R531 Weakness: Secondary | ICD-10-CM

## 2019-02-10 LAB — BLOOD CULTURE ID PANEL (REFLEXED)

## 2019-02-10 LAB — BASIC METABOLIC PANEL
Anion gap: 8 (ref 5–15)
BUN: 31 mg/dL — ABNORMAL HIGH (ref 8–23)
CO2: 22 mmol/L (ref 22–32)
Calcium: 8.2 mg/dL — ABNORMAL LOW (ref 8.9–10.3)
Chloride: 109 mmol/L (ref 98–111)
Creatinine, Ser: 1.79 mg/dL — ABNORMAL HIGH (ref 0.44–1.00)
GFR calc Af Amer: 29 mL/min — ABNORMAL LOW (ref >60)
GFR calc non Af Amer: 25 mL/min — ABNORMAL LOW (ref >60)
Glucose, Bld: 128 mg/dL — ABNORMAL HIGH (ref 70–99)
Potassium: 4.1 mmol/L (ref 3.5–5.1)
Sodium: 139 mmol/L (ref 135–145)

## 2019-02-10 LAB — CBC WITH DIFFERENTIAL/PLATELET
Abs Immature Granulocytes: 0.05 10*3/uL (ref 0.00–0.07)
Basophils Absolute: 0 10*3/uL (ref 0.0–0.1)
Basophils Relative: 0 %
Eosinophils Absolute: 0 10*3/uL (ref 0.0–0.5)
Eosinophils Relative: 0 %
HCT: 27.9 % — ABNORMAL LOW (ref 36.0–46.0)
Hemoglobin: 9.7 g/dL — ABNORMAL LOW (ref 12.0–15.0)
Immature Granulocytes: 1 %
Lymphocytes Relative: 9 %
Lymphs Abs: 0.8 10*3/uL (ref 0.7–4.0)
MCH: 33.6 pg (ref 26.0–34.0)
MCHC: 34.8 g/dL (ref 30.0–36.0)
MCV: 96.5 fL (ref 80.0–100.0)
Monocytes Absolute: 0.8 10*3/uL (ref 0.1–1.0)
Monocytes Relative: 9 %
Neutro Abs: 6.8 10*3/uL (ref 1.7–7.7)
Neutrophils Relative %: 81 %
Platelets: 141 10*3/uL — ABNORMAL LOW (ref 150–400)
RBC: 2.89 MIL/uL — ABNORMAL LOW (ref 3.87–5.11)
RDW: 13.8 % (ref 11.5–15.5)
WBC: 8.4 10*3/uL (ref 4.0–10.5)
nRBC: 0 % (ref 0.0–0.2)

## 2019-02-10 LAB — GLUCOSE, CAPILLARY: Glucose-Capillary: 110 mg/dL — ABNORMAL HIGH (ref 70–99)

## 2019-02-10 MED ORDER — SODIUM CHLORIDE 0.9 % IV SOLN
2.0000 g | INTRAVENOUS | Status: DC
  Administered 2019-02-10 – 2019-02-12 (×3): 2 g via INTRAVENOUS
  Filled 2019-02-10 (×3): qty 2

## 2019-02-10 MED ORDER — SODIUM CHLORIDE 0.9 % IV SOLN
INTRAVENOUS | Status: AC
  Administered 2019-02-10: 19:00:00 via INTRAVENOUS

## 2019-02-10 NOTE — ED Notes (Signed)
Date and time results received: 02/10/19 0520 (use smartphrase ".now" to insert current time)  Test: blood cultures Critical Value: gram (-) rods x 2 culture bottle  Name of Provider Notified: Dr. Myna Hidalgo  Orders Received? Or Actions Taken?: Actions Taken: no orders received2

## 2019-02-10 NOTE — Progress Notes (Signed)
PROGRESS NOTE    KELYN PONCIANO  ZJI:[id number] DOB: 1931-02-11 DOA: 02/09/2019 PCP: [name], MD    Brief Narrative:  85 y.o. female with medical history significant for dementia, chronic kidney disease stage III, hypertension, GERD, and anemia, now presenting to the emergency department for evaluation of fevers and generalized weakness.  Patient reports that she did not feel well yesterday, has difficulty describing the symptoms with much specificity, but she denies any shortness of breath, cough, abdominal pain, vomiting, or diarrhea.  She feels weak in general but denies any focal numbness or weakness.  Daughter reports that she had a fever today, seemed to be weaker than usual, and maybe a little bit more confused as well.  Patient denies any flank pain, reports intermittent dysuria, and denies shaking chills.  ED Course: Upon arrival to the ED, patient is found to be febrile to 39.6 C, saturating well on room air, and with blood pressure 89/51.  EKG features a sinus rhythm with early R transition.  Chest x-ray is negative for acute cardiopulmonary disease.  Chemistry panel features a bicarbonate of 18 and creatinine 1.93, up from 1.31 few days earlier.  CBC features a stable normocytic anemia.  Lactic acid is reassuringly normal.  Urinalysis is concerning for infection.  Blood cultures were collected, 500 cc normal saline administered, the patient was treated with empiric Rocephin, and hospitalists were asked to admit..   Assessment & Plan: 1-sepsis secondary to gram-negative rods UTI/bacteremia -Cultures positive for gram-negative rods; including 4 out of 4 blood cultures. -Patient is afebrile and denying dysuria currently. -Still demonstrating poor oral intake and intermittent nausea. -Continue to follow final culture data -Continue IV antibiotics, Cefepime -Continue IV fluids. -Continue as needed antiemetic and follow clinical response.  2-acute on chronic renal failure: Stage III at  baseline -Serum creatinine last week 1.31 per chart review -On admission patient serum creatinine 1.93 -Most likely in the setting of prerenal azotemia and UTI. -Continue IV fluid resuscitation -Continue to follow renal function trend -Continue avoiding nephrotoxic agents -Continue treatment of UTI -Creatinine today 1.79  3-hypertension -Continue holding losartan in the setting of acute on chronic renal failure. -Follow vital signs  -cardizem resumed with holding parameters -Continue gentle fluid resuscitation.  4-anemia of chronic kidney disease -No signs of active bleeding -Hemoglobin overall stable -Continue outpatient follow-up with heme-onc; she is receiving treatment with B12 injections and intermittent infusion of Aranesp.  5-dementia -No behavioral disturbances appreciated -Continue supportive care -Continue Aricept  6-GERD -continue PPI  DVT prophylaxis: Heparin Code Status: Full code Family Communication: No family at bedside. Disposition Plan: Remains inpatient, continue IV antibiotics, continue IV fluids, continue supportive care.  Follow renal function trend and sensitivities/specificity of culture data  Consultants:   None  Procedures:   See below for x-ray reports  Antimicrobials:  Anti-infectives (From admission, onward)   Start     Dose/Rate Route Frequency Ordered Stop   02/10/19 2130  cefTRIAXone (ROCEPHIN) 1 g in sodium chloride 0.9 % 100 mL IVPB  Status:  Discontinued     1 g 200 mL/hr over 30 Minutes Intravenous Every 24 hours 02/09/19 2148 02/10/19 0524   02/10/19 0630  ceFEPIme (MAXIPIME) 2 g in sodium chloride 0.9 % 100 mL IVPB     2 g 200 mL/hr over 30 Minutes Intravenous Every 24 hours 02/10/19 0617     02/09/19 2130  cefTRIAXone (ROCEPHIN) 1 g in sodium chloride 0.9 % 100 mL IVPB     1 g 200 mL/hr  over 30 Minutes Intravenous  Once 02/09/19 2128 02/09/19 2241       Subjective: Afebrile currently, denies chest pain or shortness of  breath.  Reports mild intermittent nausea, no vomiting, positive decreased appetite.  Objective: Vitals:   02/10/19 0630 02/10/19 0700 02/10/19 0740 02/10/19 1505  BP: (!) 128/57 130/60 134/67 (!) 110/51  Pulse: 72 71 77 66  Resp:   17 16  Temp:   98.2 F (36.8 C) 98.4 F (36.9 C)  TempSrc:   Oral Oral  SpO2: 100% 100% 96% 100%  Weight:   61.5 kg   Height:        Intake/Output Summary (Last 24 hours) at 02/10/2019 1836 Last data filed at 02/10/2019 1800 Gross per 24 hour  Intake 1449.9 ml  Output -  Net 1449.9 ml   Filed Weights   02/09/19 1531 02/10/19 0740  Weight: 63.1 kg 61.5 kg    Examination: General exam: Alert, awake, oriented x 2; currently afebrile; reporting decreased appetite and intermittent nausea.  No abdominal pain, no chest pain, no shortness of breath. Respiratory system: Clear to auscultation. Respiratory effort normal. Cardiovascular system:RRR. No murmurs, rubs or gallops. Gastrointestinal system: Abdomen is nondistended, soft and nontender. No organomegaly or masses felt. Normal bowel sounds heard. Central nervous system: Alert and oriented. No focal neurological deficits. Extremities: No C/C/E, +pedal pulses Skin: No rashes, lesions or ulcers Psychiatry: Judgement and insight appear normal. Mood & affect appropriate.    Data Reviewed: I have personally reviewed following labs and imaging studies  CBC: Recent Labs  Lab 02/09/19 1547 02/10/19 0416  WBC 9.1 8.4  NEUTROABS 7.3 6.8  HGB 10.9* 9.7*  HCT 32.8* 27.9*  MCV 95.1 96.5  PLT 159 176*   Basic Metabolic Panel: Recent Labs  Lab 02/09/19 1547 02/10/19 0416  NA 139 139  K 4.9 4.1  CL 107 109  CO2 18* 22  GLUCOSE 157* 128*  BUN 30* 31*  CREATININE 1.93* 1.79*  CALCIUM 8.6* 8.2*   GFR: Estimated Creatinine Clearance: 18.7 mL/min (A) (by C-G formula based on SCr of 1.79 mg/dL (H)).   Liver Function Tests: Recent Labs  Lab 02/09/19 1547  AST 17  ALT 14  ALKPHOS 65  BILITOT  1.4*  PROT 7.7  ALBUMIN 3.8   Coagulation Profile: Recent Labs  Lab 02/09/19 1547  INR 1.1   CBG: Recent Labs  Lab 02/10/19 0746  GLUCAP 110*   Urine analysis:    Component Value Date/Time   COLORURINE YELLOW 02/09/2019 2100   APPEARANCEUR TURBID (A) 02/09/2019 2100   LABSPEC 1.015 02/09/2019 2100   PHURINE 6.0 02/09/2019 2100   GLUCOSEU NEGATIVE 02/09/2019 2100   HGBUR SMALL (A) 02/09/2019 2100   BILIRUBINUR NEGATIVE 02/09/2019 2100   KETONESUR NEGATIVE 02/09/2019 2100   PROTEINUR 100 (A) 02/09/2019 2100   UROBILINOGEN 0.2 01/17/2015 1000   NITRITE POSITIVE (A) 02/09/2019 2100   LEUKOCYTESUR LARGE (A) 02/09/2019 2100    Recent Results (from the past 240 hour(s))  SARS Coronavirus 2 (CEPHEID - Performed in [place] hospital lab), Hosp Order     Status: None   Collection Time: 02/01/19 10:00 AM  Result Value Ref Range Status   SARS Coronavirus 2 NEGATIVE NEGATIVE Final    Comment: (NOTE) If result is NEGATIVE SARS-CoV-2 target nucleic acids are NOT DETECTED. The SARS-CoV-2 RNA is generally detectable in upper and lower  respiratory specimens during the acute phase of infection. The lowest  concentration of SARS-CoV-2 viral copies this assay can detect  is 250  copies / mL. A negative result does not preclude SARS-CoV-2 infection  and should not be used as the sole basis for treatment or other  patient management decisions.  A negative result may occur with  improper specimen collection / handling, submission of specimen other  than nasopharyngeal swab, presence of viral mutation(s) within the  areas targeted by this assay, and inadequate number of viral copies  (<250 copies / mL). A negative result must be combined with clinical  observations, patient history, and epidemiological information. If result is POSITIVE SARS-CoV-2 target nucleic acids are DETECTED. The SARS-CoV-2 RNA is generally detectable in upper and lower  respiratory specimens dur ing the acute  phase of infection.  Positive  results are indicative of active infection with SARS-CoV-2.  Clinical  correlation with patient history and other diagnostic information is  necessary to determine patient infection status.  Positive results do  not rule out bacterial infection or co-infection with other viruses. If result is PRESUMPTIVE POSTIVE SARS-CoV-2 nucleic acids MAY BE PRESENT.   A presumptive positive result was obtained on the submitted specimen  and confirmed on repeat testing.  While 2019 novel coronavirus  (SARS-CoV-2) nucleic acids may be present in the submitted sample  additional confirmatory testing may be necessary for epidemiological  and / or clinical management purposes  to differentiate between  SARS-CoV-2 and other Sarbecovirus currently known to infect humans.  If clinically indicated additional testing with an alternate test  methodology [id number]) is advised. The SARS-CoV-2 RNA is generally  detectable in upper and lower respiratory sp ecimens during the acute  phase of infection. The expected result is Negative. Fact Sheet for Patients:  StrictlyIdeas.no Fact Sheet for Healthcare Providers: BankingDealers.co.za This test is not yet approved or cleared by the [place] FDA and has been authorized for detection and/or diagnosis of SARS-CoV-2 by FDA under an Emergency Use Authorization (EUA).  This EUA will remain in effect (meaning this test can be used) for the duration of the COVID-19 declaration under Section 564(b)(1) of the Act, 21 U.S.C. section 360bbb-3(b)(1), unless the authorization is terminated or revoked sooner. Performed at [facility], [address] [place], [place] [address]   SARS Coronavirus 2 (CEPHEID- Performed in [facility] hospital lab), Hosp Order     Status: None   Collection Time: 02/09/19  3:49 PM  Result Value Ref Range Status   SARS Coronavirus 2 NEGATIVE NEGATIVE Final    Comment:  (NOTE) If result is NEGATIVE SARS-CoV-2 target nucleic acids are NOT DETECTED. The SARS-CoV-2 RNA is generally detectable in upper and lower  respiratory specimens during the acute phase of infection. The lowest  concentration of SARS-CoV-2 viral copies this assay can detect is 250  copies / mL. A negative result does not preclude SARS-CoV-2 infection  and should not be used as the sole basis for treatment or other  patient management decisions.  A negative result may occur with  improper specimen collection / handling, submission of specimen other  than nasopharyngeal swab, presence of viral mutation(s) within the  areas targeted by this assay, and inadequate number of viral copies  (<250 copies / mL). A negative result must be combined with clinical  observations, patient history, and epidemiological information. If result is POSITIVE SARS-CoV-2 target nucleic acids are DETECTED. The SARS-CoV-2 RNA is generally detectable in upper and lower  respiratory specimens dur ing the acute phase of infection.  Positive  results are indicative of active infection with SARS-CoV-2.  Clinical  correlation with patient history and other diagnostic information is  necessary to determine patient infection status.  Positive results do  not rule out bacterial infection or co-infection with other viruses. If result is PRESUMPTIVE POSTIVE SARS-CoV-2 nucleic acids MAY BE PRESENT.   A presumptive positive result was obtained on the submitted specimen  and confirmed on repeat testing.  While 2019 novel coronavirus  (SARS-CoV-2) nucleic acids may be present in the submitted sample  additional confirmatory testing may be necessary for epidemiological  and / or clinical management purposes  to differentiate between  SARS-CoV-2 and other Sarbecovirus currently known to infect humans.  If clinically indicated additional testing with an alternate test  methodology [id number]) is advised. The SARS-CoV-2 RNA is  generally  detectable in upper and lower respiratory sp ecimens during the acute  phase of infection. The expected result is Negative. Fact Sheet for Patients:  StrictlyIdeas.no Fact Sheet for Healthcare Providers: BankingDealers.co.za This test is not yet approved or cleared by the [place] FDA and has been authorized for detection and/or diagnosis of SARS-CoV-2 by FDA under an Emergency Use Authorization (EUA).  This EUA will remain in effect (meaning this test can be used) for the duration of the COVID-19 declaration under Section 564(b)(1) of the Act, 21 U.S.C. section 360bbb-3(b)(1), unless the authorization is terminated or revoked sooner. Performed at [facility], [address], [place], [place] [address]   Culture, blood (Routine x 2)     Status: None (Preliminary result)   Collection Time: 02/09/19  4:01 PM  Result Value Ref Range Status   Specimen Description   Final    RIGHT ANTECUBITAL Performed at [facility], [address], [place], [place] [address]    Special Requests   Final    BOTTLES DRAWN AEROBIC AND ANAEROBIC Blood Culture adequate volume Performed at [facility], [address], [place], [place] [address]    Culture  Setup Time   Final    GRAM NEGATIVE RODS BOTH ANAEROBIC AND AEROBIC BOTTLES Gram Stain Report Called to,Read Back By and Verified With: C BELTON,RN @0521  02/10/19 MKELLY Organism ID to follow CRITICAL RESULT CALLED TO, READ BACK BY AND VERIFIED WITH: MEYER PHARMD AT 1200 ON [id number] BY SJW Performed at [facility], [place] [address] [place], [place] [address]    Culture GRAM NEGATIVE RODS  Final   Report Status PENDING  Incomplete  Blood Culture ID Panel (Reflexed)     Status: None   Collection Time: 02/09/19  4:01 PM  Result Value Ref Range Status   Enterococcus species NOT DETECTED NOT DETECTED Final   Listeria monocytogenes NOT DETECTED NOT DETECTED Final   Staphylococcus species  NOT DETECTED NOT DETECTED Final   Staphylococcus aureus (BCID) NOT DETECTED NOT DETECTED Final   Streptococcus species NOT DETECTED NOT DETECTED Final   Streptococcus agalactiae NOT DETECTED NOT DETECTED Final   Streptococcus pneumoniae NOT DETECTED NOT DETECTED Final   Streptococcus pyogenes NOT DETECTED NOT DETECTED Final   Acinetobacter baumannii NOT DETECTED NOT DETECTED Final   Enterobacteriaceae species NOT DETECTED NOT DETECTED Final   Enterobacter cloacae complex NOT DETECTED NOT DETECTED Final   Escherichia coli NOT DETECTED NOT DETECTED Final   Klebsiella oxytoca NOT DETECTED NOT DETECTED Final   Klebsiella pneumoniae NOT DETECTED NOT DETECTED Final   Proteus species NOT DETECTED NOT DETECTED Final   Serratia marcescens NOT DETECTED NOT DETECTED Final   Haemophilus influenzae NOT DETECTED NOT DETECTED Final   Neisseria meningitidis NOT DETECTED NOT DETECTED  Final   Pseudomonas aeruginosa NOT DETECTED NOT DETECTED Final   Candida albicans NOT DETECTED NOT DETECTED Final   Candida glabrata NOT DETECTED NOT DETECTED Final   Candida krusei NOT DETECTED NOT DETECTED Final   Candida parapsilosis NOT DETECTED NOT DETECTED Final   Candida tropicalis NOT DETECTED NOT DETECTED Final    Comment: Performed at [facility], [place] [address], [place], [place] [address]  Culture, blood (Routine x 2)     Status: None (Preliminary result)   Collection Time: 02/09/19  4:02 PM  Result Value Ref Range Status   Specimen Description   Final    BLOOD RIGHT WRIST Performed at [facility], [address], [place], [place] [address]    Special Requests   Final    BOTTLES DRAWN AEROBIC AND ANAEROBIC Blood Culture adequate volume Performed at [facility], [address] [place], [place] [address]    Culture  Setup Time   Final    GRAM NEGATIVE RODS BOTH ANAEROBIC AND AEROBIC BOTTLES Gram Stain Report Called to,Read Back By and Verified With: C BELTON,RN @0522  02/10/19 MKELLY CRITICAL VALUE  NOTED.  VALUE IS CONSISTENT WITH PREVIOUSLY REPORTED AND CALLED VALUE. Performed at [facility], [place] [address], [place],  [address]    Culture GRAM NEGATIVE RODS  Final   Report Status PENDING  Incomplete     Radiology Studies: Dg Chest Port 1 View  Result Date: 02/09/2019 CLINICAL DATA:  Sepsis EXAM: PORTABLE CHEST 1 VIEW COMPARISON:  02/01/2019 FINDINGS: The heart size and mediastinal contours are within normal limits. Both lungs are clear. The visualized skeletal structures are unremarkable. IMPRESSION: No active disease. Electronically Signed   By: Kathreen Devoid   On: 02/09/2019 16:22    Scheduled Meds: . aspirin EC  81 mg Oral Daily  . diltiazem  240 mg Oral Daily  . donepezil  10 mg Oral QHS  . heparin  5,000 Units Subcutaneous Q8H  . pantoprazole  40 mg Oral q morning - 10a  . sodium chloride flush  3 mL Intravenous Q12H   Continuous Infusions: . sodium chloride    . ceFEPime (MAXIPIME) IV 2 g (02/10/19 0645)     LOS: 0 days    Time spent: 30 minutes.   Barton Dubois, MD [facility] Pager [phone]   02/10/2019, 6:36 PM

## 2019-02-10 NOTE — Care Management Obs Status (Signed)
[facility] NOTIFICATION   Patient Details  Name: Laura Davenport MRN: [id number] Date of Birth: 1931-07-26   Medicare Observation Status Notification Given:  Yes    Tommy Medal 02/10/2019, 1:32 PM

## 2019-02-10 NOTE — Progress Notes (Signed)
Pharmacy Antibiotic Note  Laura Davenport is a 85 y.o. female admitted on 02/09/2019 with bacteremia.  Pharmacy has been consulted for Cefepime dosing.  Plan: Cefepime 2gm IV q 24hr.  Height: 5\' 3"  (160 cm) Weight: 139 lb 1.8 oz (63.1 kg) IBW/kg (Calculated) : 52.4  Temp (24hrs), Avg:103.2 F (39.6 C), Min:103.2 F (39.6 C), Max:103.2 F (39.6 C)  Recent Labs  Lab 02/09/19 1547 02/09/19 1601 02/09/19 1723 02/10/19 0416  WBC 9.1  --   --  8.4  CREATININE 1.93*  --   --  1.79*  LATICACIDVEN  --  1.1 1.1  --     Estimated Creatinine Clearance: 20.2 mL/min (A) (by C-G formula based on SCr of 1.79 mg/dL (H)).    Allergies  Allergen Reactions  . Oxycodone Hives    Antimicrobials this admission: 6/9 ceftriaxone 1 gm IV once in ED  Dose adjustments this admission: Decreased to q 24 hr for Crcl ~ 20 ml/min  Microbiology results: 6/9 BCx: GNR x 2 bottles  Thank you for allowing pharmacy to be a part of this patient's care.  Wyline Mood 02/10/2019 6:18 AM

## 2019-02-10 NOTE — Progress Notes (Signed)
PHARMACY - PHYSICIAN COMMUNICATION CRITICAL VALUE ALERT - BLOOD CULTURE IDENTIFICATION (BCID)  Laura Davenport is an 85 y.o. female who presented to [facility] on [facility]09/2019 with a chief complaint of  Bacteremia.  Assessment:  GNR found in 4/4 bottles  Name of physician (or Provider) Contacted: [place]  Current antibiotics: cefepime 2g IV q24h  Changes to prescribed antibiotics recommended:  Patient is on recommended antibiotics - No changes needed  Consider de-escalating to ceftriaxone if/when appropriate.  Results for orders placed or performed during the hospital encounter of 02/09/19  Blood Culture ID Panel (Reflexed) (Collected: 02/09/2019  4:01 PM)  Result Value Ref Range   Enterococcus species NOT DETECTED NOT DETECTED   Listeria monocytogenes NOT DETECTED NOT DETECTED   Staphylococcus species NOT DETECTED NOT DETECTED   Staphylococcus aureus (BCID) NOT DETECTED NOT DETECTED   Streptococcus species NOT DETECTED NOT DETECTED   Streptococcus agalactiae NOT DETECTED NOT DETECTED   Streptococcus pneumoniae NOT DETECTED NOT DETECTED   Streptococcus pyogenes NOT DETECTED NOT DETECTED   Acinetobacter baumannii NOT DETECTED NOT DETECTED   Enterobacteriaceae species NOT DETECTED NOT DETECTED   Enterobacter cloacae complex NOT DETECTED NOT DETECTED   Escherichia coli NOT DETECTED NOT DETECTED   Klebsiella oxytoca NOT DETECTED NOT DETECTED   Klebsiella pneumoniae NOT DETECTED NOT DETECTED   Proteus species NOT DETECTED NOT DETECTED   Serratia marcescens NOT DETECTED NOT DETECTED   Haemophilus influenzae NOT DETECTED NOT DETECTED   Neisseria meningitidis NOT DETECTED NOT DETECTED   Pseudomonas aeruginosa NOT DETECTED NOT DETECTED   Candida albicans NOT DETECTED NOT DETECTED   Candida glabrata NOT DETECTED NOT DETECTED   Candida krusei NOT DETECTED NOT DETECTED   Candida parapsilosis NOT DETECTED NOT DETECTED   Candida tropicalis NOT DETECTED NOT DETECTED    Despina Pole 02/10/2019   12:02 PM

## 2019-02-10 NOTE — Care Management (Signed)
Patient active with [facility] for RN and PT services.

## 2019-02-11 DIAGNOSIS — A4150 Gram-negative sepsis, unspecified: Secondary | ICD-10-CM | POA: Diagnosis present

## 2019-02-11 DIAGNOSIS — N3000 Acute cystitis without hematuria: Secondary | ICD-10-CM | POA: Diagnosis not present

## 2019-02-11 DIAGNOSIS — E1122 Type 2 diabetes mellitus with diabetic chronic kidney disease: Secondary | ICD-10-CM | POA: Diagnosis present

## 2019-02-11 DIAGNOSIS — N390 Urinary tract infection, site not specified: Secondary | ICD-10-CM | POA: Diagnosis present

## 2019-02-11 DIAGNOSIS — N170 Acute kidney failure with tubular necrosis: Secondary | ICD-10-CM | POA: Diagnosis present

## 2019-02-11 DIAGNOSIS — E785 Hyperlipidemia, unspecified: Secondary | ICD-10-CM | POA: Diagnosis present

## 2019-02-11 DIAGNOSIS — Z20828 Contact with and (suspected) exposure to other viral communicable diseases: Secondary | ICD-10-CM | POA: Diagnosis present

## 2019-02-11 DIAGNOSIS — F0390 Unspecified dementia without behavioral disturbance: Secondary | ICD-10-CM | POA: Diagnosis present

## 2019-02-11 DIAGNOSIS — R410 Disorientation, unspecified: Secondary | ICD-10-CM | POA: Diagnosis present

## 2019-02-11 DIAGNOSIS — R531 Weakness: Secondary | ICD-10-CM

## 2019-02-11 DIAGNOSIS — N179 Acute kidney failure, unspecified: Secondary | ICD-10-CM | POA: Diagnosis not present

## 2019-02-11 DIAGNOSIS — N183 Chronic kidney disease, stage 3 (moderate): Secondary | ICD-10-CM | POA: Diagnosis not present

## 2019-02-11 DIAGNOSIS — A419 Sepsis, unspecified organism: Secondary | ICD-10-CM | POA: Diagnosis not present

## 2019-02-11 DIAGNOSIS — Z8711 Personal history of peptic ulcer disease: Secondary | ICD-10-CM | POA: Diagnosis not present

## 2019-02-11 DIAGNOSIS — I129 Hypertensive chronic kidney disease with stage 1 through stage 4 chronic kidney disease, or unspecified chronic kidney disease: Secondary | ICD-10-CM | POA: Diagnosis present

## 2019-02-11 DIAGNOSIS — I10 Essential (primary) hypertension: Secondary | ICD-10-CM | POA: Diagnosis not present

## 2019-02-11 DIAGNOSIS — B9689 Other specified bacterial agents as the cause of diseases classified elsewhere: Secondary | ICD-10-CM | POA: Diagnosis present

## 2019-02-11 DIAGNOSIS — Z7982 Long term (current) use of aspirin: Secondary | ICD-10-CM | POA: Diagnosis not present

## 2019-02-11 DIAGNOSIS — D631 Anemia in chronic kidney disease: Secondary | ICD-10-CM | POA: Diagnosis present

## 2019-02-11 DIAGNOSIS — K219 Gastro-esophageal reflux disease without esophagitis: Secondary | ICD-10-CM | POA: Diagnosis present

## 2019-02-11 DIAGNOSIS — Z79899 Other long term (current) drug therapy: Secondary | ICD-10-CM | POA: Diagnosis not present

## 2019-02-11 NOTE — Progress Notes (Signed)
PROGRESS NOTE    Laura Davenport  JIR:[id number] DOB: [birth year] DOA: 02/09/2019 PCP: [name], MD    Brief Narrative:  85 y.o. female with medical history significant for dementia, chronic kidney disease stage III, hypertension, GERD, and anemia, now presenting to the emergency department for evaluation of fevers and generalized weakness.  Patient reports that she did not feel well yesterday, has difficulty describing the symptoms with much specificity, but she denies any shortness of breath, cough, abdominal pain, vomiting, or diarrhea.  She feels weak in general but denies any focal numbness or weakness.  Daughter reports that she had a fever today, seemed to be weaker than usual, and maybe a little bit more confused as well.  Patient denies any flank pain, reports intermittent dysuria, and denies shaking chills.  ED Course: Upon arrival to the ED, patient is found to be febrile to 39.6 C, saturating well on room air, and with blood pressure 89/51.  EKG features a sinus rhythm with early R transition.  Chest x-ray is negative for acute cardiopulmonary disease.  Chemistry panel features a bicarbonate of 18 and creatinine 1.93, up from 1.31 few days earlier.  CBC features a stable normocytic anemia.  Lactic acid is reassuringly normal.  Urinalysis is concerning for infection.  Blood cultures were collected, 500 cc normal saline administered, the patient was treated with empiric Rocephin, and hospitalists were asked to admit..   Assessment & Plan: 1-sepsis secondary to gram-negative rods UTI/bacteremia: Cultures identified Providencia stuartii as isolated microorganism -Cultures positive for gram-negative rods; including 4 out of 4 blood cultures. -Patient is afebrile and denying dysuria currently. -Still demonstrating poor oral intake and intermittent nausea. -Continue to follow final culture data, sensitivity and oral regimen choices. -Continue IV antibiotics, (Cefepime) -Continue IV fluids.  -Continue as needed antiemetic and follow clinical response.  2-acute on chronic renal failure: Stage III at baseline -Serum creatinine last week 1.31 per chart review -On admission patient serum creatinine 1.93 -Most likely in the setting of prerenal azotemia and UTI. -Continue IV fluid resuscitation -Continue to follow renal function trend; repeat basic metabolic panel in a.m. -Continue avoiding nephrotoxic agents -Continue treatment of UTI  3-hypertension -Continue holding losartan in the setting of acute on chronic renal failure. -Follow vital signs  -cardizem resumed with holding parameters -Continue gentle fluid resuscitation.  4-anemia of chronic kidney disease -No signs of active bleeding -Hemoglobin overall stable -Continue outpatient follow-up with heme-onc; she is receiving treatment with B12 injections and intermittent infusion of Aranesp.  5-dementia -Mentation appears to be at baseline currently. -No behavioral disturbances appreciated -Continue supportive care -Continue Aricept  6-GERD -continue PPI  DVT prophylaxis: Heparin Code Status: Full code Family Communication: No family at bedside. Disposition Plan: Remains inpatient, continue IV antibiotics, continue IV fluids, continue supportive care.  Follow renal function trend and sensitivities/specificity of culture data  Consultants:   None  Procedures:   See below for x-ray reports  Antimicrobials:  Anti-infectives (From admission, onward)   Start     Dose/Rate Route Frequency Ordered Stop   02/10/19 2130  cefTRIAXone (ROCEPHIN) 1 g in sodium chloride 0.9 % 100 mL IVPB  Status:  Discontinued     1 g 200 mL/hr over 30 Minutes Intravenous Every 24 hours 02/09/19 2148 02/10/19 0524   02/10/19 0630  ceFEPIme (MAXIPIME) 2 g in sodium chloride 0.9 % 100 mL IVPB     2 g 200 mL/hr over 30 Minutes Intravenous Every 24 hours 02/10/19 0617     02/09/19  2130  cefTRIAXone (ROCEPHIN) 1 g in sodium chloride 0.9  % 100 mL IVPB     1 g 200 mL/hr over 30 Minutes Intravenous  Once 02/09/19 2128 02/09/19 2241       Subjective: No fever, no chest pain, no shortness of breath.  Feeling weak and deconditioned.  No nausea vomiting reported today.  Still with poor oral intake.  Objective: Vitals:   02/11/19 0536 02/11/19 0545 02/11/19 0759 02/11/19 1411  BP: (!) 108/45 (!) 110/45  (!) 114/58  Pulse: (!) 55 (!) 57  67  Resp: 16   17  Temp: 97.7 F (36.5 C)   98.1 F (36.7 C)  TempSrc: Oral   Oral  SpO2: 100%  99% 100%  Weight:      Height:        Intake/Output Summary (Last 24 hours) at 02/11/2019 1624 Last data filed at 02/11/2019 1320 Gross per 24 hour  Intake 1089.21 ml  Output 1000 ml  Net 89.21 ml   Filed Weights   02/09/19 1531 02/10/19 0740 02/11/19 0445  Weight: 63.1 kg 61.5 kg 63.7 kg    Examination: General exam: Alert, awake, oriented x 2; in no acute distress.  Afebrile, denies nausea or vomiting currently.  Overall feeling better and no complaining of dysuria at this time.  Patient is still weak, with decreased oral intake and feeling deconditioned.   Respiratory system: Clear to auscultation. Respiratory effort normal. Cardiovascular system:RRR. No murmurs, rubs, gallops. Gastrointestinal system: Abdomen is nondistended, soft and nontender. No organomegaly or masses felt. Normal bowel sounds heard. Central nervous system: Alert and oriented. No focal neurological deficits. Extremities: No C/C/E, +pedal pulses Skin: No rashes, lesions or ulcers Psychiatry: Judgement and insight appear normal. Mood & affect appropriate.    Data Reviewed: I have personally reviewed following labs and imaging studies  CBC: Recent Labs  Lab 02/09/19 1547 02/10/19 0416  WBC 9.1 8.4  NEUTROABS 7.3 6.8  HGB 10.9* 9.7*  HCT 32.8* 27.9*  MCV 95.1 96.5  PLT 159 270*   Basic Metabolic Panel: Recent Labs  Lab 02/09/19 1547 02/10/19 0416  NA 139 139  K 4.9 4.1  CL 107 109  CO2 18* 22   GLUCOSE 157* 128*  BUN 30* 31*  CREATININE 1.93* 1.79*  CALCIUM 8.6* 8.2*   GFR: Estimated Creatinine Clearance: 20.3 mL/min (A) (by C-G formula based on SCr of 1.79 mg/dL (H)).   Liver Function Tests: Recent Labs  Lab 02/09/19 1547  AST 17  ALT 14  ALKPHOS 65  BILITOT 1.4*  PROT 7.7  ALBUMIN 3.8   Coagulation Profile: Recent Labs  Lab 02/09/19 1547  INR 1.1   CBG: Recent Labs  Lab 02/10/19 0746  GLUCAP 110*   Urine analysis:    Component Value Date/Time   COLORURINE YELLOW 02/09/2019 2100   APPEARANCEUR TURBID (A) 02/09/2019 2100   LABSPEC 1.015 02/09/2019 2100   PHURINE 6.0 02/09/2019 2100   GLUCOSEU NEGATIVE 02/09/2019 2100   HGBUR SMALL (A) 02/09/2019 2100   BILIRUBINUR NEGATIVE 02/09/2019 2100   KETONESUR NEGATIVE 02/09/2019 2100   PROTEINUR 100 (A) 02/09/2019 2100   UROBILINOGEN 0.2 01/17/2015 1000   NITRITE POSITIVE (A) 02/09/2019 2100   LEUKOCYTESUR LARGE (A) 02/09/2019 2100    Recent Results (from the past 240 hour(s))  SARS Coronavirus 2 (CEPHEID- Performed in [facility] hospital lab), Hosp Order     Status: None   Collection Time: 02/09/19  3:49 PM   Specimen: Nasopharyngeal Swab  Result Value  Ref Range Status   SARS Coronavirus 2 NEGATIVE NEGATIVE Final    Comment: (NOTE) If result is NEGATIVE SARS-CoV-2 target nucleic acids are NOT DETECTED. The SARS-CoV-2 RNA is generally detectable in upper and lower  respiratory specimens during the acute phase of infection. The lowest  concentration of SARS-CoV-2 viral copies this assay can detect is 250  copies / mL. A negative result does not preclude SARS-CoV-2 infection  and should not be used as the sole basis for treatment or other  patient management decisions.  A negative result may occur with  improper specimen collection / handling, submission of specimen other  than nasopharyngeal swab, presence of viral mutation(s) within the  areas targeted by this assay, and inadequate number of viral  copies  (<250 copies / mL). A negative result must be combined with clinical  observations, patient history, and epidemiological information. If result is POSITIVE SARS-CoV-2 target nucleic acids are DETECTED. The SARS-CoV-2 RNA is generally detectable in upper and lower  respiratory specimens dur ing the acute phase of infection.  Positive  results are indicative of active infection with SARS-CoV-2.  Clinical  correlation with patient history and other diagnostic information is  necessary to determine patient infection status.  Positive results do  not rule out bacterial infection or co-infection with other viruses. If result is PRESUMPTIVE POSTIVE SARS-CoV-2 nucleic acids MAY BE PRESENT.   A presumptive positive result was obtained on the submitted specimen  and confirmed on repeat testing.  While 2019 novel coronavirus  (SARS-CoV-2) nucleic acids may be present in the submitted sample  additional confirmatory testing may be necessary for epidemiological  and / or clinical management purposes  to differentiate between  SARS-CoV-2 and other Sarbecovirus currently known to infect humans.  If clinically indicated additional testing with an alternate test  methodology [phone]) is advised. The SARS-CoV-2 RNA is generally  detectable in upper and lower respiratory sp ecimens during the acute  phase of infection. The expected result is Negative. Fact Sheet for Patients:  StrictlyIdeas.no Fact Sheet for Healthcare Providers: BankingDealers.co.za This test is not yet approved or cleared by the [place] FDA and has been authorized for detection and/or diagnosis of SARS-CoV-2 by FDA under an Emergency Use Authorization (EUA).  This EUA will remain in effect (meaning this test can be used) for the duration of the COVID-19 declaration under Section 564(b)(1) of the Act, 21 U.S.C. section 360bbb-3(b)(1), unless the authorization is terminated  or revoked sooner. Performed at [facility], [address] [place], [place] [address]   Culture, blood (Routine x 2)     Status: Abnormal (Preliminary result)   Collection Time: 02/09/19  4:01 PM   Specimen: Right Antecubital; Blood  Result Value Ref Range Status   Specimen Description   Final    RIGHT ANTECUBITAL Performed at [facility], [address] [place], [place] [address]    Special Requests   Final    BOTTLES DRAWN AEROBIC AND ANAEROBIC Blood Culture adequate volume Performed at [place] [place], [place] [address]    Culture  Setup Time   Final    GRAM NEGATIVE RODS BOTH ANAEROBIC AND AEROBIC BOTTLES Gram Stain Report Called to,Read Back By and Verified With: C BELTON,RN @0521  02/10/19 MKELLY CRITICAL RESULT CALLED TO, READ BACK BY AND VERIFIED WITH: MEYER PHARMD AT 1200 ON [id number] BY SJW    Culture (A)  Final    PROVIDENCIA STUARTII SUSCEPTIBILITIES TO FOLLOW Performed at [facility], [place]  [address], [place], [place] [address]    Report Status PENDING  Incomplete  Blood Culture ID Panel (Reflexed)     Status: None   Collection Time: 02/09/19  4:01 PM  Result Value Ref Range Status   Enterococcus species NOT DETECTED NOT DETECTED Final   Listeria monocytogenes NOT DETECTED NOT DETECTED Final   Staphylococcus species NOT DETECTED NOT DETECTED Final   Staphylococcus aureus (BCID) NOT DETECTED NOT DETECTED Final   Streptococcus species NOT DETECTED NOT DETECTED Final   Streptococcus agalactiae NOT DETECTED NOT DETECTED Final   Streptococcus pneumoniae NOT DETECTED NOT DETECTED Final   Streptococcus pyogenes NOT DETECTED NOT DETECTED Final   Acinetobacter baumannii NOT DETECTED NOT DETECTED Final   Enterobacteriaceae species NOT DETECTED NOT DETECTED Final   Enterobacter cloacae complex NOT DETECTED NOT DETECTED Final   Escherichia coli NOT DETECTED NOT DETECTED Final   Klebsiella oxytoca NOT DETECTED NOT DETECTED Final   Klebsiella  pneumoniae NOT DETECTED NOT DETECTED Final   Proteus species NOT DETECTED NOT DETECTED Final   Serratia marcescens NOT DETECTED NOT DETECTED Final   Haemophilus influenzae NOT DETECTED NOT DETECTED Final   Neisseria meningitidis NOT DETECTED NOT DETECTED Final   Pseudomonas aeruginosa NOT DETECTED NOT DETECTED Final   Candida albicans NOT DETECTED NOT DETECTED Final   Candida glabrata NOT DETECTED NOT DETECTED Final   Candida krusei NOT DETECTED NOT DETECTED Final   Candida parapsilosis NOT DETECTED NOT DETECTED Final   Candida tropicalis NOT DETECTED NOT DETECTED Final    Comment: Performed at [facility], [place] [address], [place], [place] [address]  Culture, blood (Routine x 2)     Status: Abnormal (Preliminary result)   Collection Time: 02/09/19  4:02 PM   Specimen: BLOOD RIGHT WRIST  Result Value Ref Range Status   Specimen Description   Final    BLOOD RIGHT WRIST Performed at [facility], [address], [place], [place] [address]    Special Requests   Final    BOTTLES DRAWN AEROBIC AND ANAEROBIC Blood Culture adequate volume Performed at [facility], [address], [place], [place] [address]    Culture  Setup Time   Final    GRAM NEGATIVE RODS BOTH ANAEROBIC AND AEROBIC BOTTLES Gram Stain Report Called to,Read Back By and Verified With: C BELTON,RN @0522  02/10/19 MKELLY CRITICAL VALUE NOTED.  VALUE IS CONSISTENT WITH PREVIOUSLY REPORTED AND CALLED VALUE.    Culture (A)  Final    PROVIDENCIA STUARTII SUSCEPTIBILITIES TO FOLLOW Performed at [facility], [place] [address], [place], [place] [address]    Report Status PENDING  Incomplete  Urine culture     Status: Abnormal (Preliminary result)   Collection Time: 02/09/19  9:48 PM   Specimen: Urine, Random  Result Value Ref Range Status   Specimen Description   Final    URINE, RANDOM Performed at [facility], [address] [place], [place] [address]    Special Requests   Final    NONE Performed at [facility], [address] [place], [place] [address]    Culture >=100,000 COLONIES/mL PROVIDENCIA STUARTII (A)  Final   Report Status PENDING  Incomplete     Radiology Studies: No results found.  Scheduled Meds: . aspirin EC  81 mg Oral Daily  . diltiazem  240 mg Oral Daily  . donepezil  10 mg Oral QHS  . heparin  5,000 Units Subcutaneous Q8H  . pantoprazole  40 mg Oral q morning - 10a  .  sodium chloride flush  3 mL Intravenous Q12H   Continuous Infusions: . sodium chloride    . sodium chloride 75 mL/hr at 02/10/19 1929  . ceFEPime (MAXIPIME) IV 2 g (02/11/19 0618)     LOS: 0 days    Time spent: 30 minutes.   Barton Dubois, MD [facility] Pager [phone]   02/11/2019, 4:24 PM

## 2019-02-12 LAB — CULTURE, BLOOD (ROUTINE X 2)
Special Requests: ADEQUATE
Special Requests: ADEQUATE

## 2019-02-12 LAB — BASIC METABOLIC PANEL
Anion gap: 8 (ref 5–15)
BUN: 25 mg/dL — ABNORMAL HIGH (ref 8–23)
CO2: 21 mmol/L — ABNORMAL LOW (ref 22–32)
Calcium: 8.4 mg/dL — ABNORMAL LOW (ref 8.9–10.3)
Chloride: 111 mmol/L (ref 98–111)
Creatinine, Ser: 1.4 mg/dL — ABNORMAL HIGH (ref 0.44–1.00)
GFR calc Af Amer: 39 mL/min — ABNORMAL LOW (ref >60)
GFR calc non Af Amer: 34 mL/min — ABNORMAL LOW (ref >60)
Glucose, Bld: 92 mg/dL (ref 70–99)
Potassium: 4.3 mmol/L (ref 3.5–5.1)
Sodium: 140 mmol/L (ref 135–145)

## 2019-02-12 LAB — CBC
HCT: 33.5 % — ABNORMAL LOW (ref 36.0–46.0)
Hemoglobin: 10.6 g/dL — ABNORMAL LOW (ref 12.0–15.0)
MCH: 30.9 pg (ref 26.0–34.0)
MCHC: 31.6 g/dL (ref 30.0–36.0)
MCV: 97.7 fL (ref 80.0–100.0)
Platelets: 162 10*3/uL (ref 150–400)
RBC: 3.43 MIL/uL — ABNORMAL LOW (ref 3.87–5.11)
RDW: 13.6 % (ref 11.5–15.5)
WBC: 5.8 10*3/uL (ref 4.0–10.5)
nRBC: 0 % (ref 0.0–0.2)

## 2019-02-12 LAB — URINE CULTURE: Culture: >=100000 — AB

## 2019-02-12 LAB — GLUCOSE, CAPILLARY: Glucose-Capillary: 90 mg/dL (ref 70–99)

## 2019-02-12 MED ORDER — CIPROFLOXACIN HCL 500 MG PO TABS: 500.0000 mg | ORAL_TABLET | Freq: Every day | ORAL | 0 refills | Status: AC

## 2019-02-12 MED ORDER — LORAZEPAM 2 MG/ML IJ SOLN
0.5000 mg | Freq: Once | INTRAMUSCULAR | Status: AC
  Administered 2019-02-12: 0.5 mg via INTRAVENOUS
  Filled 2019-02-12: qty 1

## 2019-02-12 MED ORDER — LORAZEPAM 2 MG/ML IJ SOLN: 0.5000 mg | Freq: Once | INTRAMUSCULAR | Status: DC

## 2019-02-12 MED ORDER — CIPROFLOXACIN IN D5W 400 MG/200ML IV SOLN
400.0000 mg | INTRAVENOUS | Status: DC
  Administered 2019-02-12: 400 mg via INTRAVENOUS
  Filled 2019-02-12: qty 200

## 2019-02-12 NOTE — TOC Transition Note (Signed)
Transition of Care [facility]) - CM/SW Discharge Note   Patient Details  Name: Laura Davenport MRN: [id number] Date of Birth: November 07, 1930  Transition of Care [facility]) CM/SW Contact:  Shade Flood, LCSW Phone Number: 02/12/2019, 11:52 AM   Clinical Narrative:     Per MD, pt will dc home today. Resumption of HHC orders are entered. Updated [name] with [facility] and they will follow up with pt at home. Follow up PCP appointment arranged. There are no other TOC needs for dc.  Final next level of care: [place]     Patient Goals and CMS Choice        Discharge Placement                       Discharge Plan and Services                                     Social Determinants of Health (SDOH) Interventions     Readmission Risk Interventions Readmission Risk Prevention Plan 02/12/2019 02/02/2019  Post Dischage Appt - Complete  Medication Screening - Complete  Transportation Screening - Complete  PCP or Specialist Appt within 5-7 Days Complete -  Some recent data might be hidden

## 2019-02-12 NOTE — Discharge Summary (Signed)
Physician Discharge Summary  Laura Davenport AST:[id number] DOB: 04-21-1931 DOA: 02/09/2019  PCP: [name], MD  Admit date: 02/09/2019 Discharge date: 02/12/2019  Time spent: 35 minutes  Recommendations for Outpatient Follow-up:  1. Repeat basic metabolic panel to follow electrolytes and renal function 2. Reassess blood pressure and further adjust antihypertensive regimen as needed.   Discharge Diagnoses:  Principal Problem:   Sepsis secondary to UTI [facility]) Active Problems:   Essential hypertension   Anemia in chronic kidney disease   Dementia (HCC)   Acute renal failure superimposed on stage 3 chronic kidney disease (HCC)   Sepsis due to gram-negative UTI (HCC)   Acute cystitis without hematuria   Generalized weakness   Discharge Condition: Stable and improved.  Patient discharged home with home health services and instruction to follow-up with PCP in 10 days.  Diet recommendation: Heart healthy diet.  Filed Weights   02/10/19 0740 02/11/19 0445 02/12/19 0529  Weight: 61.5 kg 63.7 kg 62.3 kg    History of present illness:  As per H&P written by Dr. Myna Hidalgo on 02/09/2019. 85 y.o.femalewith medical history significant fordementia, chronic kidney disease stage III, hypertension, GERD, and anemia, now presenting to the emergency department for evaluation of fevers and generalized weakness.Patient reports that she did not feel well yesterday, has difficulty describing the symptoms with much specificity, but she denies any shortness of breath, cough, abdominal pain, vomiting, or diarrhea. She feels weak in general but denies any focal numbness or weakness. Daughter reports that she had a fever today, seemed to be weaker than usual, and maybe a little bit more confused as well. Patient denies any flank pain, reports intermittent dysuria, and denies shaking chills.  ED Course:Upon arrival to the ED, patient is found to be febrile to 39.6C,saturating well on room air, and with blood  pressure 89/51. EKG features a sinus rhythm with early R transition. Chest x-ray is negative for acute cardiopulmonary disease. Chemistry panel features a bicarbonate of 18 and creatinine 1.93, up from 1.31 few days earlier. CBC features a stable normocytic anemia. Lactic acid is reassuringly normal. Urinalysis is concerning for infection. Blood cultures were collected, 500 cc normal saline administered, the patient was treated with empiric Rocephin, and hospitalistswere asked to admit.Marland Kitchen  Hospital Course:  1-sepsis secondary to gram-negative rods UTI/bacteremia: Cultures identified Providencia stuartii as isolated microorganism -Patient is afebrile, no nausea, no vomiting and denying dysuria currently. -Case discussed with infectious disease doctor recommended transition to oral ciprofloxacin based on sensitivity and complete a total of 7 days of antibiotics. -At discharge still 5 more days pending. -Medication dose adjusted to patient's renal function  2-acute on chronic renal failure: Stage III at baseline -Serum creatinine last week 1.31 per chart review -On admission patient serum creatinine 1.93 -Most likely in the setting of prerenal azotemia and UTI. -Improved with fluid resuscitation and treatment of UTI -Patient advised to maintain adequate hydration -Continue avoiding nephrotoxic agents -Repeat basic metabolic panel at follow-up visit to reassess renal function and stability. -At discharge creatinine 1.4.  3-hypertension -Continue holding losartan at discharge in the setting of acute on chronic renal failure. -Follow vital signs and further adjust antihypertensive regimen as needed. -Continue Cardizem.  4-anemia of chronic kidney disease -No signs of active bleeding -Hemoglobin overall stable -Continue outpatient follow-up with heme-onc; she is receiving treatment with B12 injections and intermittent infusion of Aranesp.  5-dementia -Mentation appears to be at  baseline currently. -No behavioral disturbances appreciated -Continue supportive care -Continue Aricept  6-GERD -continue PPI  Procedures:  See below for x-ray reports.  Consultations:  ID phone consultation (Dr. Linus Salmons).  Discharge Exam: Vitals:   02/11/19 2125 02/12/19 0529  BP: 126/67 109/61  Pulse: 62 (!) 55  Resp: 18 18  Temp: 98.6 F (37 C) 97.7 F (36.5 C)  SpO2: (!) 87% (!) 73%    General: Alert, awake and oriented x2; in no acute distress.  Patient is afebrile, denies nausea or vomiting.  Maintaining adequate hydration and oral intake (even is still appetite is a slightly poor). Cardiovascular: RRR, no murmurs, no rubs, no gallops. Respiratory: Good air movement bilaterally, normal respiratory effort.  Good oxygen saturation on room air. Abdomen: Soft, nontender, nondistended, positive bowel sounds Extremities: No edema, no cyanosis, no clubbing.  Discharge Instructions   Discharge Instructions    Diet - low sodium heart healthy   Complete by: As directed    Discharge instructions   Complete by: As directed    Maintain adequate hydration Take medications as prescribed Arrange follow-up with PCP in 10 days.     Allergies as of 02/12/2019      Reactions   Oxycodone Hives      Medication List    STOP taking these medications   losartan 100 MG tablet Commonly known as: COZAAR     TAKE these medications   aspirin 81 MG EC tablet Take 1 tablet (81 mg total) by mouth daily.   Cartia XT 240 MG 24 hr capsule Generic drug: diltiazem Take 240 mg by mouth daily.   ciprofloxacin 500 MG tablet Commonly known as: Cipro Take 1 tablet (500 mg total) by mouth daily for 5 days.   cyanocobalamin 1000 MCG/ML injection Commonly known as: (VITAMIN B-12) ADMINISTER 1 ML(1000 MCG) UNDER THE SKIN EVERY 30 DAYS What changed: See the new instructions.   donepezil 10 MG tablet Commonly known as: ARICEPT Take 10 mg by mouth at bedtime.   pantoprazole 40 MG  tablet Commonly known as: PROTONIX TAKE 1 TABLET(40 MG) BY MOUTH DAILY before breakfast What changed:   how much to take  how to take this  when to take this      Allergies  Allergen Reactions  . Oxycodone Hives   Follow-up Information    [name], MD Follow up on 02/19/2019.   Specialty: Internal Medicine Why: You will have a telephone/Facetime visit with Dr. Willey Blade at 10:15 on Friday 6/19. Dr. Ria Comment office will call you on Thursday 6/18 to make the plan for your Friday telephone visit. Contact information: [address] [place] [address] [phone]           The results of significant diagnostics from this hospitalization (including imaging, microbiology, ancillary and laboratory) are listed below for reference.    Significant Diagnostic Studies: Dg Chest Port 1 View  Result Date: 02/09/2019 CLINICAL DATA:  Sepsis EXAM: PORTABLE CHEST 1 VIEW COMPARISON:  02/01/2019 FINDINGS: The heart size and mediastinal contours are within normal limits. Both lungs are clear. The visualized skeletal structures are unremarkable. IMPRESSION: No active disease. Electronically Signed   By: Kathreen Devoid   On: 02/09/2019 16:22   Dg Chest Port 1 View  Result Date: 02/01/2019 CLINICAL DATA:  Chest pain EXAM: PORTABLE CHEST 1 VIEW COMPARISON:  03/24/17 FINDINGS: Cardiac shadow is prominent. Aortic calcifications are again seen. The lungs are well aerated bilaterally without focal infiltrate. Mild vascular congestion is seen. No bony abnormality is noted. IMPRESSION: Mild vascular congestion without interstitial edema. Electronically Signed   By: Elta Guadeloupe  Lukens M.D.   On: 02/01/2019 08:33    Microbiology: Recent Results (from the past 240 hour(s))  SARS Coronavirus 2 (CEPHEID- Performed in [place] hospital lab), Hosp Order     Status: None   Collection Time: 02/09/19  3:49 PM   Specimen: Nasopharyngeal Swab  Result Value Ref Range Status   SARS Coronavirus 2 NEGATIVE  NEGATIVE Final    Comment: (NOTE) If result is NEGATIVE SARS-CoV-2 target nucleic acids are NOT DETECTED. The SARS-CoV-2 RNA is generally detectable in upper and lower  respiratory specimens during the acute phase of infection. The lowest  concentration of SARS-CoV-2 viral copies this assay can detect is 250  copies / mL. A negative result does not preclude SARS-CoV-2 infection  and should not be used as the sole basis for treatment or other  patient management decisions.  A negative result may occur with  improper specimen collection / handling, submission of specimen other  than nasopharyngeal swab, presence of viral mutation(s) within the  areas targeted by this assay, and inadequate number of viral copies  (<250 copies / mL). A negative result must be combined with clinical  observations, patient history, and epidemiological information. If result is POSITIVE SARS-CoV-2 target nucleic acids are DETECTED. The SARS-CoV-2 RNA is generally detectable in upper and lower  respiratory specimens dur ing the acute phase of infection.  Positive  results are indicative of active infection with SARS-CoV-2.  Clinical  correlation with patient history and other diagnostic information is  necessary to determine patient infection status.  Positive results do  not rule out bacterial infection or co-infection with other viruses. If result is PRESUMPTIVE POSTIVE SARS-CoV-2 nucleic acids MAY BE PRESENT.   A presumptive positive result was obtained on the submitted specimen  and confirmed on repeat testing.  While 2019 novel coronavirus  (SARS-CoV-2) nucleic acids may be present in the submitted sample  additional confirmatory testing may be necessary for epidemiological  and / or clinical management purposes  to differentiate between  SARS-CoV-2 and other Sarbecovirus currently known to infect humans.  If clinically indicated additional testing with an alternate test  methodology [phone]) is  advised. The SARS-CoV-2 RNA is generally  detectable in upper and lower respiratory sp ecimens during the acute  phase of infection. The expected result is Negative. Fact Sheet for Patients:  StrictlyIdeas.no Fact Sheet for Healthcare Providers: BankingDealers.co.za This test is not yet approved or cleared by the [place] FDA and has been authorized for detection and/or diagnosis of SARS-CoV-2 by FDA under an Emergency Use Authorization (EUA).  This EUA will remain in effect (meaning this test can be used) for the duration of the COVID-19 declaration under Section 564(b)(1) of the Act, 21 U.S.C. section 360bbb-3(b)(1), unless the authorization is terminated or revoked sooner. Performed at [facility], [address] [place], [place] [address]   Culture, blood (Routine x 2)     Status: Abnormal   Collection Time: 02/09/19  4:01 PM   Specimen: Right Antecubital; Blood  Result Value Ref Range Status   Specimen Description   Final    RIGHT ANTECUBITAL Performed at [facility], [address], [place],  [address]    Special Requests   Final    BOTTLES DRAWN AEROBIC AND ANAEROBIC Blood Culture adequate volume Performed at [facility], [address] [place],  [address]    Culture  Setup Time   Final    GRAM NEGATIVE RODS BOTH ANAEROBIC AND AEROBIC BOTTLES Gram Stain Report Called to,Read  Back By and Verified With: C BELTON,RN @0521  02/10/19 MKELLY CRITICAL RESULT CALLED TO, READ BACK BY AND VERIFIED WITH: MEYER PHARMD AT 1200 ON [id number] BY SJW Performed at [facility], [place] [address], [place], [place] [address]    Culture PROVIDENCIA STUARTII (A)  Final   Report Status 02/12/2019 FINAL  Final   Organism ID, Bacteria PROVIDENCIA STUARTII  Final      Susceptibility   Providencia stuartii - MIC*    AMPICILLIN >=32 RESISTANT Resistant     CEFAZOLIN >=64 RESISTANT Resistant     CEFEPIME <=1 SENSITIVE Sensitive      CEFTAZIDIME <=1 SENSITIVE Sensitive     CEFTRIAXONE <=1 SENSITIVE Sensitive     CIPROFLOXACIN <=0.25 SENSITIVE Sensitive     GENTAMICIN RESISTANT Resistant     IMIPENEM 2 SENSITIVE Sensitive     TRIMETH/SULFA <=20 SENSITIVE Sensitive     AMPICILLIN/SULBACTAM 16 INTERMEDIATE Intermediate     PIP/TAZO <=4 SENSITIVE Sensitive     * PROVIDENCIA STUARTII  Blood Culture ID Panel (Reflexed)     Status: None   Collection Time: 02/09/19  4:01 PM  Result Value Ref Range Status   Enterococcus species NOT DETECTED NOT DETECTED Final   Listeria monocytogenes NOT DETECTED NOT DETECTED Final   Staphylococcus species NOT DETECTED NOT DETECTED Final   Staphylococcus aureus (BCID) NOT DETECTED NOT DETECTED Final   Streptococcus species NOT DETECTED NOT DETECTED Final   Streptococcus agalactiae NOT DETECTED NOT DETECTED Final   Streptococcus pneumoniae NOT DETECTED NOT DETECTED Final   Streptococcus pyogenes NOT DETECTED NOT DETECTED Final   Acinetobacter baumannii NOT DETECTED NOT DETECTED Final   Enterobacteriaceae species NOT DETECTED NOT DETECTED Final   Enterobacter cloacae complex NOT DETECTED NOT DETECTED Final   Escherichia coli NOT DETECTED NOT DETECTED Final   Klebsiella oxytoca NOT DETECTED NOT DETECTED Final   Klebsiella pneumoniae NOT DETECTED NOT DETECTED Final   Proteus species NOT DETECTED NOT DETECTED Final   Serratia marcescens NOT DETECTED NOT DETECTED Final   Haemophilus influenzae NOT DETECTED NOT DETECTED Final   Neisseria meningitidis NOT DETECTED NOT DETECTED Final   Pseudomonas aeruginosa NOT DETECTED NOT DETECTED Final   Candida albicans NOT DETECTED NOT DETECTED Final   Candida glabrata NOT DETECTED NOT DETECTED Final   Candida krusei NOT DETECTED NOT DETECTED Final   Candida parapsilosis NOT DETECTED NOT DETECTED Final   Candida tropicalis NOT DETECTED NOT DETECTED Final    Comment: Performed at [facility], [place] [address], [place], [place] [address]  Culture,  blood (Routine x 2)     Status: Abnormal   Collection Time: 02/09/19  4:02 PM   Specimen: BLOOD RIGHT WRIST  Result Value Ref Range Status   Specimen Description   Final    BLOOD RIGHT WRIST Performed at [facility], [address] [place], [place] [address]    Special Requests   Final    BOTTLES DRAWN AEROBIC AND ANAEROBIC Blood Culture adequate volume Performed at [facility], [address] [place], [place] [address]    Culture  Setup Time   Final    GRAM NEGATIVE RODS BOTH ANAEROBIC AND AEROBIC BOTTLES Gram Stain Report Called to,Read Back By and Verified With: C BELTON,RN @0522  02/10/19 MKELLY CRITICAL VALUE NOTED.  VALUE IS CONSISTENT WITH PREVIOUSLY REPORTED AND CALLED VALUE.    Culture (A)  Final    PROVIDENCIA STUARTII SUSCEPTIBILITIES PERFORMED ON PREVIOUS CULTURE WITHIN THE LAST 5 DAYS. Performed at [facility], [address] [place], [place] [address]    Report Status 02/12/2019 FINAL  Final  Urine culture     Status: Abnormal   Collection Time: 02/09/19  9:48 PM   Specimen: Urine, Random  Result Value Ref Range Status   Specimen Description   Final    URINE, RANDOM Performed at [facility], [address], [place], [place] [address]    Special Requests   Final    NONE Performed at [facility], [address] [place], [place] [address]    Culture >=100,000 COLONIES/mL PROVIDENCIA STUARTII (A)  Final   Report Status 02/12/2019 FINAL  Final   Organism ID, Bacteria PROVIDENCIA STUARTII (A)  Final      Susceptibility   Providencia stuartii - MIC*    AMPICILLIN >=32 RESISTANT Resistant     CEFAZOLIN >=64 RESISTANT Resistant     CEFTRIAXONE <=1 SENSITIVE Sensitive     CIPROFLOXACIN <=0.25 SENSITIVE Sensitive     GENTAMICIN RESISTANT Resistant     IMIPENEM 2 SENSITIVE Sensitive     NITROFURANTOIN 256 RESISTANT Resistant     TRIMETH/SULFA <=20 SENSITIVE Sensitive     AMPICILLIN/SULBACTAM 16 INTERMEDIATE Intermediate     PIP/TAZO <=4 SENSITIVE  Sensitive     * >=100,000 COLONIES/mL PROVIDENCIA STUARTII     Labs: Basic Metabolic Panel: Recent Labs  Lab 02/09/19 1547 02/10/19 0416 02/12/19 0620  NA 139 139 140  K 4.9 4.1 4.3  CL 107 109 111  CO2 18* 22 21*  GLUCOSE 157* 128* 92  BUN 30* 31* 25*  CREATININE 1.93* 1.79* 1.40*  CALCIUM 8.6* 8.2* 8.4*   Liver Function Tests: Recent Labs  Lab 02/09/19 1547  AST 17  ALT 14  ALKPHOS 65  BILITOT 1.4*  PROT 7.7  ALBUMIN 3.8   CBC: Recent Labs  Lab 02/09/19 1547 02/10/19 0416 02/12/19 0620  WBC 9.1 8.4 5.8  NEUTROABS 7.3 6.8  --   HGB 10.9* 9.7* 10.6*  HCT 32.8* 27.9* 33.5*  MCV 95.1 96.5 97.7  PLT 159 141* 162   BNP (last 3 results) Recent Labs    02/01/19 0906  BNP 84.0   CBG: Recent Labs  Lab 02/10/19 0746 02/12/19 0750  GLUCAP 110* 90    Signed:  Barton Dubois MD.  [facility] 02/12/2019, 1:28 PM

## 2019-02-12 NOTE — Progress Notes (Signed)
Pt noted yelling and attempting to get out of bed, being uncooperative with care, provider notified, new order received.

## 2019-02-12 NOTE — Progress Notes (Signed)
PT DISCHARGED HOME, IV REMOVED, ANGIO INTACT, VS STABLE, DENIES C/O DISCOMFORT. PT VERBALIZED UNDERSTANDING OF ALL INSTRUCTIONS PROVIDED, LEFT FLOOR VIA WHEELCHAIR WITH BELONGINGS INTACT, ACCOMPANIED BY NURSING STAFF. PT MET AT SHORT STAY ENTRANCE DAUGHTER FOR TRANSPORTATION HOME. DISCHARGE INSTRUCTIONS EXPLAINED TO DAUGHTER, VERBALIZED UNDERSTANDING.

## 2019-02-12 NOTE — Progress Notes (Addendum)
Pharmacy Antibiotic Note  Laura Davenport is a 85 y.o. female admitted on 02/09/2019 with bacteremia/UTI.  Pharmacy has been consulted for Cipro dosing.  Plan: Ciprofloxacin 400 mg IV every 24 hours. Monitor labs, c/s, and patient improvement.  Height: 5\' 3"  (160 cm) Weight: 137 lb 5.6 oz (62.3 kg) IBW/kg (Calculated) : 52.4  Temp (24hrs), Avg:98.1 F (36.7 C), Min:97.7 F (36.5 C), Max:98.6 F (37 C)  Recent Labs  Lab 02/09/19 1547 02/09/19 1601 02/09/19 1723 02/10/19 0416 02/12/19 0620  WBC 9.1  --   --  8.4 5.8  CREATININE 1.93*  --   --  1.79* 1.40*  LATICACIDVEN  --  1.1 1.1  --   --     Estimated Creatinine Clearance: 23.9 mL/min (A) (by C-G formula based on SCr of 1.4 mg/dL (H)).    Allergies  Allergen Reactions  . Oxycodone Hives    Antimicrobials this admission: Cipro 6/12 >> Cefepime 6/10 >> 6/12 Ceftriaxone 6/10 >> 6/10  Dose adjustments this admission: Cipro  Microbiology results: 6/9 BCx: providencia stuartii 6/9 UCx: providencia stuartii  Thank you for allowing pharmacy to be a part of this patient's care.  Ramond Craver 02/12/2019 8:58 AM

## 2019-02-12 NOTE — Care Management Important Message (Signed)
Important Message  Patient Details  Name: Laura Davenport MRN: [id number] Date of Birth: 03-Jan-1931   Medicare Important Message Given:  Yes    Tommy Medal 02/12/2019, 1:53 PM

## 2019-02-14 DIAGNOSIS — D510 Vitamin B12 deficiency anemia due to intrinsic factor deficiency: Secondary | ICD-10-CM | POA: Diagnosis not present

## 2019-02-14 DIAGNOSIS — R1310 Dysphagia, unspecified: Secondary | ICD-10-CM | POA: Diagnosis not present

## 2019-02-14 DIAGNOSIS — D631 Anemia in chronic kidney disease: Secondary | ICD-10-CM | POA: Diagnosis not present

## 2019-02-14 DIAGNOSIS — E785 Hyperlipidemia, unspecified: Secondary | ICD-10-CM | POA: Diagnosis not present

## 2019-02-14 DIAGNOSIS — R079 Chest pain, unspecified: Secondary | ICD-10-CM | POA: Diagnosis not present

## 2019-02-14 DIAGNOSIS — E1122 Type 2 diabetes mellitus with diabetic chronic kidney disease: Secondary | ICD-10-CM | POA: Diagnosis not present

## 2019-02-14 DIAGNOSIS — Z9181 History of falling: Secondary | ICD-10-CM | POA: Diagnosis not present

## 2019-02-14 DIAGNOSIS — K219 Gastro-esophageal reflux disease without esophagitis: Secondary | ICD-10-CM | POA: Diagnosis not present

## 2019-02-14 DIAGNOSIS — N183 Chronic kidney disease, stage 3 (moderate): Secondary | ICD-10-CM | POA: Diagnosis not present

## 2019-02-14 DIAGNOSIS — I129 Hypertensive chronic kidney disease with stage 1 through stage 4 chronic kidney disease, or unspecified chronic kidney disease: Secondary | ICD-10-CM | POA: Diagnosis not present

## 2019-02-19 DIAGNOSIS — E785 Hyperlipidemia, unspecified: Secondary | ICD-10-CM | POA: Diagnosis not present

## 2019-02-19 DIAGNOSIS — E1122 Type 2 diabetes mellitus with diabetic chronic kidney disease: Secondary | ICD-10-CM | POA: Diagnosis not present

## 2019-02-19 DIAGNOSIS — R1310 Dysphagia, unspecified: Secondary | ICD-10-CM | POA: Diagnosis not present

## 2019-02-19 DIAGNOSIS — N390 Urinary tract infection, site not specified: Secondary | ICD-10-CM | POA: Diagnosis not present

## 2019-02-19 DIAGNOSIS — K219 Gastro-esophageal reflux disease without esophagitis: Secondary | ICD-10-CM | POA: Diagnosis not present

## 2019-02-19 DIAGNOSIS — N183 Chronic kidney disease, stage 3 (moderate): Secondary | ICD-10-CM | POA: Diagnosis not present

## 2019-02-19 DIAGNOSIS — I129 Hypertensive chronic kidney disease with stage 1 through stage 4 chronic kidney disease, or unspecified chronic kidney disease: Secondary | ICD-10-CM | POA: Diagnosis not present

## 2019-02-19 DIAGNOSIS — D631 Anemia in chronic kidney disease: Secondary | ICD-10-CM | POA: Diagnosis not present

## 2019-02-19 DIAGNOSIS — R079 Chest pain, unspecified: Secondary | ICD-10-CM | POA: Diagnosis not present

## 2019-02-19 DIAGNOSIS — Z9181 History of falling: Secondary | ICD-10-CM | POA: Diagnosis not present

## 2019-02-19 DIAGNOSIS — D510 Vitamin B12 deficiency anemia due to intrinsic factor deficiency: Secondary | ICD-10-CM | POA: Diagnosis not present

## 2019-02-21 DIAGNOSIS — N183 Chronic kidney disease, stage 3 (moderate): Secondary | ICD-10-CM | POA: Diagnosis not present

## 2019-02-21 DIAGNOSIS — D510 Vitamin B12 deficiency anemia due to intrinsic factor deficiency: Secondary | ICD-10-CM | POA: Diagnosis not present

## 2019-02-21 DIAGNOSIS — Z9181 History of falling: Secondary | ICD-10-CM | POA: Diagnosis not present

## 2019-02-21 DIAGNOSIS — R079 Chest pain, unspecified: Secondary | ICD-10-CM | POA: Diagnosis not present

## 2019-02-21 DIAGNOSIS — D631 Anemia in chronic kidney disease: Secondary | ICD-10-CM | POA: Diagnosis not present

## 2019-02-21 DIAGNOSIS — R1310 Dysphagia, unspecified: Secondary | ICD-10-CM | POA: Diagnosis not present

## 2019-02-21 DIAGNOSIS — E785 Hyperlipidemia, unspecified: Secondary | ICD-10-CM | POA: Diagnosis not present

## 2019-02-21 DIAGNOSIS — K219 Gastro-esophageal reflux disease without esophagitis: Secondary | ICD-10-CM | POA: Diagnosis not present

## 2019-02-21 DIAGNOSIS — E1122 Type 2 diabetes mellitus with diabetic chronic kidney disease: Secondary | ICD-10-CM | POA: Diagnosis not present

## 2019-02-21 DIAGNOSIS — I129 Hypertensive chronic kidney disease with stage 1 through stage 4 chronic kidney disease, or unspecified chronic kidney disease: Secondary | ICD-10-CM | POA: Diagnosis not present

## 2019-02-23 DIAGNOSIS — K219 Gastro-esophageal reflux disease without esophagitis: Secondary | ICD-10-CM | POA: Diagnosis not present

## 2019-02-23 DIAGNOSIS — R079 Chest pain, unspecified: Secondary | ICD-10-CM | POA: Diagnosis not present

## 2019-02-23 DIAGNOSIS — D631 Anemia in chronic kidney disease: Secondary | ICD-10-CM | POA: Diagnosis not present

## 2019-02-23 DIAGNOSIS — N183 Chronic kidney disease, stage 3 (moderate): Secondary | ICD-10-CM | POA: Diagnosis not present

## 2019-02-23 DIAGNOSIS — R1310 Dysphagia, unspecified: Secondary | ICD-10-CM | POA: Diagnosis not present

## 2019-02-23 DIAGNOSIS — Z9181 History of falling: Secondary | ICD-10-CM | POA: Diagnosis not present

## 2019-02-23 DIAGNOSIS — E785 Hyperlipidemia, unspecified: Secondary | ICD-10-CM | POA: Diagnosis not present

## 2019-02-23 DIAGNOSIS — I129 Hypertensive chronic kidney disease with stage 1 through stage 4 chronic kidney disease, or unspecified chronic kidney disease: Secondary | ICD-10-CM | POA: Diagnosis not present

## 2019-02-23 DIAGNOSIS — D510 Vitamin B12 deficiency anemia due to intrinsic factor deficiency: Secondary | ICD-10-CM | POA: Diagnosis not present

## 2019-02-23 DIAGNOSIS — E1122 Type 2 diabetes mellitus with diabetic chronic kidney disease: Secondary | ICD-10-CM | POA: Diagnosis not present

## 2019-02-25 DIAGNOSIS — D631 Anemia in chronic kidney disease: Secondary | ICD-10-CM | POA: Diagnosis not present

## 2019-02-25 DIAGNOSIS — Z9181 History of falling: Secondary | ICD-10-CM | POA: Diagnosis not present

## 2019-02-25 DIAGNOSIS — N183 Chronic kidney disease, stage 3 (moderate): Secondary | ICD-10-CM | POA: Diagnosis not present

## 2019-02-25 DIAGNOSIS — K219 Gastro-esophageal reflux disease without esophagitis: Secondary | ICD-10-CM | POA: Diagnosis not present

## 2019-02-25 DIAGNOSIS — E1122 Type 2 diabetes mellitus with diabetic chronic kidney disease: Secondary | ICD-10-CM | POA: Diagnosis not present

## 2019-02-25 DIAGNOSIS — E785 Hyperlipidemia, unspecified: Secondary | ICD-10-CM | POA: Diagnosis not present

## 2019-02-25 DIAGNOSIS — D510 Vitamin B12 deficiency anemia due to intrinsic factor deficiency: Secondary | ICD-10-CM | POA: Diagnosis not present

## 2019-02-25 DIAGNOSIS — R1310 Dysphagia, unspecified: Secondary | ICD-10-CM | POA: Diagnosis not present

## 2019-02-25 DIAGNOSIS — I129 Hypertensive chronic kidney disease with stage 1 through stage 4 chronic kidney disease, or unspecified chronic kidney disease: Secondary | ICD-10-CM | POA: Diagnosis not present

## 2019-02-25 DIAGNOSIS — R079 Chest pain, unspecified: Secondary | ICD-10-CM | POA: Diagnosis not present

## 2019-02-26 DIAGNOSIS — E785 Hyperlipidemia, unspecified: Secondary | ICD-10-CM | POA: Diagnosis not present

## 2019-02-26 DIAGNOSIS — N183 Chronic kidney disease, stage 3 (moderate): Secondary | ICD-10-CM | POA: Diagnosis not present

## 2019-02-26 DIAGNOSIS — R1310 Dysphagia, unspecified: Secondary | ICD-10-CM | POA: Diagnosis not present

## 2019-02-26 DIAGNOSIS — K219 Gastro-esophageal reflux disease without esophagitis: Secondary | ICD-10-CM | POA: Diagnosis not present

## 2019-02-26 DIAGNOSIS — I129 Hypertensive chronic kidney disease with stage 1 through stage 4 chronic kidney disease, or unspecified chronic kidney disease: Secondary | ICD-10-CM | POA: Diagnosis not present

## 2019-02-26 DIAGNOSIS — D631 Anemia in chronic kidney disease: Secondary | ICD-10-CM | POA: Diagnosis not present

## 2019-02-26 DIAGNOSIS — E1122 Type 2 diabetes mellitus with diabetic chronic kidney disease: Secondary | ICD-10-CM | POA: Diagnosis not present

## 2019-02-26 DIAGNOSIS — D510 Vitamin B12 deficiency anemia due to intrinsic factor deficiency: Secondary | ICD-10-CM | POA: Diagnosis not present

## 2019-02-26 DIAGNOSIS — Z9181 History of falling: Secondary | ICD-10-CM | POA: Diagnosis not present

## 2019-02-26 DIAGNOSIS — R079 Chest pain, unspecified: Secondary | ICD-10-CM | POA: Diagnosis not present

## 2019-03-01 DIAGNOSIS — E1122 Type 2 diabetes mellitus with diabetic chronic kidney disease: Secondary | ICD-10-CM | POA: Diagnosis not present

## 2019-03-01 DIAGNOSIS — I129 Hypertensive chronic kidney disease with stage 1 through stage 4 chronic kidney disease, or unspecified chronic kidney disease: Secondary | ICD-10-CM | POA: Diagnosis not present

## 2019-03-01 DIAGNOSIS — R1310 Dysphagia, unspecified: Secondary | ICD-10-CM | POA: Diagnosis not present

## 2019-03-01 DIAGNOSIS — E785 Hyperlipidemia, unspecified: Secondary | ICD-10-CM | POA: Diagnosis not present

## 2019-03-01 DIAGNOSIS — R079 Chest pain, unspecified: Secondary | ICD-10-CM | POA: Diagnosis not present

## 2019-03-01 DIAGNOSIS — D510 Vitamin B12 deficiency anemia due to intrinsic factor deficiency: Secondary | ICD-10-CM | POA: Diagnosis not present

## 2019-03-01 DIAGNOSIS — K219 Gastro-esophageal reflux disease without esophagitis: Secondary | ICD-10-CM | POA: Diagnosis not present

## 2019-03-01 DIAGNOSIS — D631 Anemia in chronic kidney disease: Secondary | ICD-10-CM | POA: Diagnosis not present

## 2019-03-01 DIAGNOSIS — N183 Chronic kidney disease, stage 3 (moderate): Secondary | ICD-10-CM | POA: Diagnosis not present

## 2019-03-01 DIAGNOSIS — Z9181 History of falling: Secondary | ICD-10-CM | POA: Diagnosis not present

## 2019-03-03 DIAGNOSIS — D510 Vitamin B12 deficiency anemia due to intrinsic factor deficiency: Secondary | ICD-10-CM | POA: Diagnosis not present

## 2019-03-03 DIAGNOSIS — K219 Gastro-esophageal reflux disease without esophagitis: Secondary | ICD-10-CM | POA: Diagnosis not present

## 2019-03-03 DIAGNOSIS — D631 Anemia in chronic kidney disease: Secondary | ICD-10-CM | POA: Diagnosis not present

## 2019-03-03 DIAGNOSIS — E1122 Type 2 diabetes mellitus with diabetic chronic kidney disease: Secondary | ICD-10-CM | POA: Diagnosis not present

## 2019-03-03 DIAGNOSIS — Z9181 History of falling: Secondary | ICD-10-CM | POA: Diagnosis not present

## 2019-03-03 DIAGNOSIS — R1310 Dysphagia, unspecified: Secondary | ICD-10-CM | POA: Diagnosis not present

## 2019-03-03 DIAGNOSIS — N183 Chronic kidney disease, stage 3 (moderate): Secondary | ICD-10-CM | POA: Diagnosis not present

## 2019-03-03 DIAGNOSIS — E785 Hyperlipidemia, unspecified: Secondary | ICD-10-CM | POA: Diagnosis not present

## 2019-03-03 DIAGNOSIS — R079 Chest pain, unspecified: Secondary | ICD-10-CM | POA: Diagnosis not present

## 2019-03-03 DIAGNOSIS — I129 Hypertensive chronic kidney disease with stage 1 through stage 4 chronic kidney disease, or unspecified chronic kidney disease: Secondary | ICD-10-CM | POA: Diagnosis not present

## 2019-03-04 DIAGNOSIS — R079 Chest pain, unspecified: Secondary | ICD-10-CM | POA: Diagnosis not present

## 2019-03-04 DIAGNOSIS — Z9181 History of falling: Secondary | ICD-10-CM | POA: Diagnosis not present

## 2019-03-04 DIAGNOSIS — E785 Hyperlipidemia, unspecified: Secondary | ICD-10-CM | POA: Diagnosis not present

## 2019-03-04 DIAGNOSIS — D510 Vitamin B12 deficiency anemia due to intrinsic factor deficiency: Secondary | ICD-10-CM | POA: Diagnosis not present

## 2019-03-04 DIAGNOSIS — R1310 Dysphagia, unspecified: Secondary | ICD-10-CM | POA: Diagnosis not present

## 2019-03-04 DIAGNOSIS — E1122 Type 2 diabetes mellitus with diabetic chronic kidney disease: Secondary | ICD-10-CM | POA: Diagnosis not present

## 2019-03-04 DIAGNOSIS — K219 Gastro-esophageal reflux disease without esophagitis: Secondary | ICD-10-CM | POA: Diagnosis not present

## 2019-03-04 DIAGNOSIS — N183 Chronic kidney disease, stage 3 (moderate): Secondary | ICD-10-CM | POA: Diagnosis not present

## 2019-03-04 DIAGNOSIS — D631 Anemia in chronic kidney disease: Secondary | ICD-10-CM | POA: Diagnosis not present

## 2019-03-04 DIAGNOSIS — I129 Hypertensive chronic kidney disease with stage 1 through stage 4 chronic kidney disease, or unspecified chronic kidney disease: Secondary | ICD-10-CM | POA: Diagnosis not present

## 2019-03-09 DIAGNOSIS — D631 Anemia in chronic kidney disease: Secondary | ICD-10-CM | POA: Diagnosis not present

## 2019-03-09 DIAGNOSIS — R079 Chest pain, unspecified: Secondary | ICD-10-CM | POA: Diagnosis not present

## 2019-03-09 DIAGNOSIS — R1310 Dysphagia, unspecified: Secondary | ICD-10-CM | POA: Diagnosis not present

## 2019-03-09 DIAGNOSIS — E785 Hyperlipidemia, unspecified: Secondary | ICD-10-CM | POA: Diagnosis not present

## 2019-03-09 DIAGNOSIS — K219 Gastro-esophageal reflux disease without esophagitis: Secondary | ICD-10-CM | POA: Diagnosis not present

## 2019-03-09 DIAGNOSIS — N183 Chronic kidney disease, stage 3 (moderate): Secondary | ICD-10-CM | POA: Diagnosis not present

## 2019-03-09 DIAGNOSIS — I129 Hypertensive chronic kidney disease with stage 1 through stage 4 chronic kidney disease, or unspecified chronic kidney disease: Secondary | ICD-10-CM | POA: Diagnosis not present

## 2019-03-09 DIAGNOSIS — E1122 Type 2 diabetes mellitus with diabetic chronic kidney disease: Secondary | ICD-10-CM | POA: Diagnosis not present

## 2019-03-09 DIAGNOSIS — Z9181 History of falling: Secondary | ICD-10-CM | POA: Diagnosis not present

## 2019-03-09 DIAGNOSIS — D510 Vitamin B12 deficiency anemia due to intrinsic factor deficiency: Secondary | ICD-10-CM | POA: Diagnosis not present

## 2019-04-03 DIAGNOSIS — E119 Type 2 diabetes mellitus without complications: Secondary | ICD-10-CM | POA: Diagnosis not present

## 2019-04-15 DIAGNOSIS — N183 Chronic kidney disease, stage 3 (moderate): Secondary | ICD-10-CM | POA: Diagnosis not present

## 2019-04-15 DIAGNOSIS — Z79899 Other long term (current) drug therapy: Secondary | ICD-10-CM | POA: Diagnosis not present

## 2019-04-15 DIAGNOSIS — E1129 Type 2 diabetes mellitus with other diabetic kidney complication: Secondary | ICD-10-CM | POA: Diagnosis not present

## 2019-04-15 DIAGNOSIS — A419 Sepsis, unspecified organism: Secondary | ICD-10-CM | POA: Diagnosis not present

## 2019-04-22 DIAGNOSIS — N183 Chronic kidney disease, stage 3 (moderate): Secondary | ICD-10-CM | POA: Diagnosis not present

## 2019-04-22 DIAGNOSIS — D638 Anemia in other chronic diseases classified elsewhere: Secondary | ICD-10-CM | POA: Diagnosis not present

## 2019-04-22 DIAGNOSIS — G309 Alzheimer's disease, unspecified: Secondary | ICD-10-CM | POA: Diagnosis not present

## 2019-04-22 DIAGNOSIS — E870 Hyperosmolality and hypernatremia: Secondary | ICD-10-CM | POA: Diagnosis not present

## 2019-04-27 ENCOUNTER — Other Ambulatory Visit (HOSPITAL_COMMUNITY): Payer: Self-pay | Admitting: Nurse Practitioner

## 2019-04-27 DIAGNOSIS — N183 Chronic kidney disease, stage 3 unspecified: Secondary | ICD-10-CM

## 2019-04-27 DIAGNOSIS — D631 Anemia in chronic kidney disease: Secondary | ICD-10-CM

## 2019-04-28 ENCOUNTER — Other Ambulatory Visit: Payer: Self-pay

## 2019-04-28 ENCOUNTER — Inpatient Hospital Stay (HOSPITAL_COMMUNITY): Payer: Medicare Other | Attending: Internal Medicine

## 2019-04-28 DIAGNOSIS — D631 Anemia in chronic kidney disease: Secondary | ICD-10-CM | POA: Insufficient documentation

## 2019-04-28 DIAGNOSIS — Z79899 Other long term (current) drug therapy: Secondary | ICD-10-CM | POA: Diagnosis not present

## 2019-04-28 DIAGNOSIS — N183 Chronic kidney disease, stage 3 unspecified: Secondary | ICD-10-CM

## 2019-04-28 DIAGNOSIS — I129 Hypertensive chronic kidney disease with stage 1 through stage 4 chronic kidney disease, or unspecified chronic kidney disease: Secondary | ICD-10-CM | POA: Diagnosis not present

## 2019-04-28 LAB — COMPREHENSIVE METABOLIC PANEL
ALT: 6 U/L (ref 0–44)
AST: 13 U/L — ABNORMAL LOW (ref 15–41)
Albumin: 3.8 g/dL (ref 3.5–5.0)
Alkaline Phosphatase: 56 U/L (ref 38–126)
Anion gap: 8 (ref 5–15)
BUN: 22 mg/dL (ref 8–23)
CO2: 21 mmol/L — ABNORMAL LOW (ref 22–32)
Calcium: 8.8 mg/dL — ABNORMAL LOW (ref 8.9–10.3)
Chloride: 114 mmol/L — ABNORMAL HIGH (ref 98–111)
Creatinine, Ser: 1.59 mg/dL — ABNORMAL HIGH (ref 0.44–1.00)
GFR calc Af Amer: 34 mL/min — ABNORMAL LOW (ref >60)
GFR calc non Af Amer: 29 mL/min — ABNORMAL LOW (ref >60)
Glucose, Bld: 125 mg/dL — ABNORMAL HIGH (ref 70–99)
Potassium: 4.4 mmol/L (ref 3.5–5.1)
Sodium: 143 mmol/L (ref 135–145)
Total Bilirubin: 0.5 mg/dL (ref 0.3–1.2)
Total Protein: 7.4 g/dL (ref 6.5–8.1)

## 2019-04-28 LAB — CBC WITH DIFFERENTIAL/PLATELET
Abs Immature Granulocytes: 0.02 10*3/uL (ref 0.00–0.07)
Basophils Absolute: 0 10*3/uL (ref 0.0–0.1)
Basophils Relative: 0 %
Eosinophils Absolute: 0.1 10*3/uL (ref 0.0–0.5)
Eosinophils Relative: 1 %
HCT: 31.6 % — ABNORMAL LOW (ref 36.0–46.0)
Hemoglobin: 9.9 g/dL — ABNORMAL LOW (ref 12.0–15.0)
Immature Granulocytes: 0 %
Lymphocytes Relative: 49 %
Lymphs Abs: 3.4 10*3/uL (ref 0.7–4.0)
MCH: 30.5 pg (ref 26.0–34.0)
MCHC: 31.3 g/dL (ref 30.0–36.0)
MCV: 97.2 fL (ref 80.0–100.0)
Monocytes Absolute: 0.6 10*3/uL (ref 0.1–1.0)
Monocytes Relative: 8 %
Neutro Abs: 2.9 10*3/uL (ref 1.7–7.7)
Neutrophils Relative %: 42 %
Platelets: 215 10*3/uL (ref 150–400)
RBC: 3.25 MIL/uL — ABNORMAL LOW (ref 3.87–5.11)
RDW: 14.9 % (ref 11.5–15.5)
WBC: 7 10*3/uL (ref 4.0–10.5)
nRBC: 0 % (ref 0.0–0.2)

## 2019-04-28 LAB — IRON AND TIBC
Iron: 69 ug/dL (ref 28–170)
Saturation Ratios: 30 % (ref 10.4–31.8)
TIBC: 227 ug/dL — ABNORMAL LOW (ref 250–450)
UIBC: 158 ug/dL

## 2019-04-28 LAB — FERRITIN: Ferritin: 240 ng/mL (ref 11–307)

## 2019-04-28 LAB — LACTATE DEHYDROGENASE: LDH: 154 U/L (ref 98–192)

## 2019-04-28 LAB — FOLATE: Folate: 18 ng/mL (ref >5.9)

## 2019-04-28 LAB — VITAMIN B12: Vitamin B-12: >7500 pg/mL — ABNORMAL HIGH (ref 180–914)

## 2019-04-29 LAB — PROTEIN ELECTROPHORESIS, SERUM
A/G Ratio: 1.3 (ref 0.7–1.7)
Albumin ELP: 3.6 g/dL (ref 2.9–4.4)
Alpha-1-Globulin: 0.2 g/dL (ref 0.0–0.4)
Alpha-2-Globulin: 0.7 g/dL (ref 0.4–1.0)
Beta Globulin: 0.7 g/dL (ref 0.7–1.3)
Gamma Globulin: 1.2 g/dL (ref 0.4–1.8)
Globulin, Total: 2.7 g/dL (ref 2.2–3.9)
Total Protein ELP: 6.3 g/dL (ref 6.0–8.5)

## 2019-04-29 LAB — VITAMIN D 25 HYDROXY (VIT D DEFICIENCY, FRACTURES): Vit D, 25-Hydroxy: 10.4 ng/mL — ABNORMAL LOW (ref 30.0–100.0)

## 2019-05-01 LAB — METHYLMALONIC ACID, SERUM: Methylmalonic Acid, Quantitative: 269 nmol/L (ref 0–378)

## 2019-05-05 ENCOUNTER — Other Ambulatory Visit (HOSPITAL_COMMUNITY): Payer: Self-pay | Admitting: Nurse Practitioner

## 2019-05-05 ENCOUNTER — Inpatient Hospital Stay (HOSPITAL_COMMUNITY): Payer: Medicare Other | Attending: Nurse Practitioner | Admitting: Nurse Practitioner

## 2019-05-05 ENCOUNTER — Other Ambulatory Visit: Payer: Self-pay

## 2019-05-05 VITALS — BP 133/52 | HR 74 | Temp 97.3°F | Resp 16 | Wt 131.4 lb

## 2019-05-05 DIAGNOSIS — E1122 Type 2 diabetes mellitus with diabetic chronic kidney disease: Secondary | ICD-10-CM | POA: Diagnosis not present

## 2019-05-05 DIAGNOSIS — N183 Chronic kidney disease, stage 3 unspecified: Secondary | ICD-10-CM

## 2019-05-05 DIAGNOSIS — E538 Deficiency of other specified B group vitamins: Secondary | ICD-10-CM | POA: Diagnosis not present

## 2019-05-05 DIAGNOSIS — I471 Supraventricular tachycardia: Secondary | ICD-10-CM | POA: Insufficient documentation

## 2019-05-05 DIAGNOSIS — E559 Vitamin D deficiency, unspecified: Secondary | ICD-10-CM

## 2019-05-05 DIAGNOSIS — K2980 Duodenitis without bleeding: Secondary | ICD-10-CM | POA: Insufficient documentation

## 2019-05-05 DIAGNOSIS — I129 Hypertensive chronic kidney disease with stage 1 through stage 4 chronic kidney disease, or unspecified chronic kidney disease: Secondary | ICD-10-CM | POA: Insufficient documentation

## 2019-05-05 DIAGNOSIS — R5383 Other fatigue: Secondary | ICD-10-CM | POA: Diagnosis not present

## 2019-05-05 DIAGNOSIS — D509 Iron deficiency anemia, unspecified: Secondary | ICD-10-CM | POA: Insufficient documentation

## 2019-05-05 DIAGNOSIS — Z79899 Other long term (current) drug therapy: Secondary | ICD-10-CM | POA: Insufficient documentation

## 2019-05-05 DIAGNOSIS — Z7982 Long term (current) use of aspirin: Secondary | ICD-10-CM | POA: Diagnosis not present

## 2019-05-05 DIAGNOSIS — E785 Hyperlipidemia, unspecified: Secondary | ICD-10-CM | POA: Insufficient documentation

## 2019-05-05 DIAGNOSIS — D631 Anemia in chronic kidney disease: Secondary | ICD-10-CM

## 2019-05-05 MED ORDER — DARBEPOETIN ALFA 60 MCG/0.3ML IJ SOSY
50.0000 ug | PREFILLED_SYRINGE | Freq: Once | INTRAMUSCULAR | Status: AC
  Administered 2019-05-05: 50 ug via SUBCUTANEOUS
  Filled 2019-05-05: qty 0.3

## 2019-05-05 MED ORDER — ERGOCALCIFEROL 1.25 MG (50000 UT) PO CAPS: 50000.0000 [IU] | ORAL_CAPSULE | ORAL | 4 refills | Status: DC

## 2019-05-05 NOTE — Progress Notes (Signed)
Patient tolerated injection with no complaints voiced.  Site clean and dry with no bruising or swelling noted at site.  Band aid applied.  Vss with discharge and left ambulatory with no s/s of distress noted.  

## 2019-05-05 NOTE — Assessment & Plan Note (Addendum)
1.  Iron deficiency anemia: - Anemia felt to be secondary due to chronic kidney disease. -Patient had an EGD revealing erosive gastritis and moderate duodenitis. - Patient was being treated with Aranesp 50 mcg every 8 weeks. - Patient's last Aranesp injection was 06/03/2018. -Labs done on 04/28/2019 showed her hemoglobin 9.9, ferritin 240, percent saturation 30, platelets 215, WBC 7.0.  M spike not observed.  Creatinine 1.59 -She denies any bright red bleeding per rectum or melena. -We will restart Aranesp injections every 8 weeks.  She will have her first injection today. -Patient will follow-up in 8 weeks with labs.  2.  Vitamin B12 deficiency: - Patient is on monthly B12 injections at home. -Labs on 04/28/2019 showed her B12 level greater than 7500 -We will stop home B12 injections at this time.  We will check her labs at her next visit.  3.  Chronic kidney disease: - Patient has a history of chronic kidney disease. - Labs done on 04/28/2019 showed her creatinine 1.59. -We will continue to monitor.  4.  Vitamin D deficiency: - Labs done on 04/28/2019 showed her vitamin D level 10.4. -We will call her in a prescription for vitamin D 50,000 units weekly. -We will monitor her labs on her next visit.

## 2019-05-05 NOTE — Addendum Note (Signed)
Addended by: Charlyne Petrin B on: 05/05/2019 11:43 AM   Modules accepted: Orders

## 2019-05-05 NOTE — Progress Notes (Signed)
[place], [place] [address]   CLINIC:  Medical Oncology/Hematology  PCP:  [name], MD [address] [place] [address] [phone]   REASON FOR VISIT: Follow-up for iron deficiency anemia  CURRENT THERAPY: Aranesp every 8 weeks   INTERVAL HISTORY:  Ms. Wiess 85 y.o. female returns for routine follow-up for iron deficiency anemia.  Patient reports she has been feeling a little more fatigued than usual.  She is only sitting around her house not doing much.  She denies any bright red bleeding per rectum or melena. Denies any nausea, vomiting, or diarrhea. Denies any new pains. Had not noticed any recent bleeding such as epistaxis, hematuria or hematochezia. Denies recent chest pain on exertion, shortness of breath on minimal exertion, pre-syncopal episodes, or palpitations. Denies any numbness or tingling in hands or feet. Denies any recent fevers, infections, or recent hospitalizations. Patient reports appetite at 75% and energy level at 50%.  She is eating well maintaining her weight at this time.    REVIEW OF SYSTEMS:  Review of Systems  Constitutional: Positive for fatigue.  All other systems reviewed and are negative.    PAST MEDICAL/SURGICAL HISTORY:  Past Medical History:  Diagnosis Date  . Anemia in chronic kidney disease 01/20/2016  . B12 deficiency anemia 10/25/2015  . Chronic renal disease, stage 3, moderately decreased glomerular filtration rate (GFR) between 30-59 mL/min/1.73 square meter ([place]) 01/07/2016  . Depression   . Essential hypertension   . GI bleed    Severe gastritis and duodenal ulcers by EGD May 2016   . HLD (hyperlipidemia)   . PSVT (paroxysmal supraventricular tachycardia) ([place])   . Septic arthritis (HCC)    MRSA, left knee   . Type 2 diabetes mellitus ([place])    Past Surgical History:  Procedure Laterality Date  . CHOLECYSTECTOMY N/A 11/04/2012   Procedure: LAPAROSCOPIC CHOLECYSTECTOMY;   Surgeon: Jamesetta So, MD;  Location: [facility];  Service: General;  Laterality: N/A;  . ESOPHAGOGASTRODUODENOSCOPY N/A 01/06/2015   SLF: mild esophagitis  . I&D EXTREMITY Left 12/24/2014   Procedure: IRRIGATION AND DEBRIDEMENT EXTREMITY AND ARTHROSCOPY KNEE;  Surgeon: Renette Butters, MD;  Location: [facility];  Service: Orthopedics;  Laterality: Left;  . KNEE SURGERY    . SAVORY DILATION  01/06/2015   Procedure: SAVORY DILATION;  Surgeon: Danie Binder, MD;  [facility];  Service: Endoscopy;;     SOCIAL HISTORY:  Social History   Socioeconomic History  . Marital status: Widowed    Spouse name: Not on file  . Number of children: 82  . Years of education: Not on file  . Highest education level: Not on file  Occupational History  . Occupation: retired; [employer]  . Financial resource strain: Not on file  . Food insecurity    Worry: Not on file    Inability: Not on file  . Transportation needs    Medical: Not on file    Non-medical: Not on file  Tobacco Use  . Smoking status: Never Smoker  . Smokeless tobacco: Never Used  . Tobacco comment: Never smoked  Substance and Sexual Activity  . Alcohol use: No    Alcohol/week: 0.0 standard drinks  . Drug use: No  . Sexual activity: Not on file  Lifestyle  . Physical activity    Days per week: Not on file    Minutes per session: Not on file  . Stress: Not on file  Relationships  . Social Herbalist on phone: Not on file    Gets together: Not on file    Attends religious service: Not on file    Active member of club or organization: Not on file    Attends meetings of clubs or organizations: Not on file    Relationship status: Not on file  . Intimate partner violence    Fear of current or ex partner: Not on file    Emotionally abused: Not on file    Physically abused: Not on file    Forced sexual activity: Not on file  Other Topics Concern  . Not on file  Social History Narrative   Lives  alone   3 daughters & granddaughter nearby   Lost 1 son-strep          FAMILY HISTORY:  Family History  Family history unknown: Yes    CURRENT MEDICATIONS:  Outpatient Encounter Medications as of 05/05/2019  Medication Sig  . aspirin EC 81 MG EC tablet Take 1 tablet (81 mg total) by mouth daily.  Marland Kitchen CARTIA XT 240 MG 24 hr capsule Take 240 mg by mouth daily.   . cyanocobalamin (,VITAMIN B-12,) 1000 MCG/ML injection ADMINISTER 1 ML(1000 MCG) UNDER THE SKIN EVERY 30 DAYS (Patient taking differently: Inject 1,000 mcg into the muscle every 30 (thirty) days. )  . donepezil (ARICEPT) 10 MG tablet Take 10 mg by mouth at bedtime.   Marland Kitchen losartan (COZAAR) 100 MG tablet   . pantoprazole (PROTONIX) 40 MG tablet TAKE 1 TABLET(40 MG) BY MOUTH DAILY before breakfast (Patient taking differently: Take 40 mg by mouth every morning. TAKE 1 TABLET(40 MG) BY MOUTH DAILY before breakfast)  . ergocalciferol (VITAMIN D2) 1.25 MG (50000 UT) capsule Take 1 capsule (50,000 Units total) by mouth once a week.   No facility-administered encounter medications on file as of 05/05/2019.     ALLERGIES:  Allergies  Allergen Reactions  . Oxycodone Hives     PHYSICAL EXAM:  ECOG Performance status: 2  Vitals:   05/05/19 1051  BP: (!) 133/52  Pulse: 74  Resp: 16  Temp: (!) 97.3 F (36.3 C)  SpO2: 100%   Filed Weights   05/05/19 1133  Weight: 131 lb 6.4 oz (59.6 kg)    Physical Exam Constitutional:      Appearance: Normal appearance. She is normal weight.  Cardiovascular:     Rate and Rhythm: Normal rate and regular rhythm.     Heart sounds: Normal heart sounds.  Pulmonary:     Effort: Pulmonary effort is normal.     Breath sounds: Normal breath sounds.  Abdominal:     General: Bowel sounds are normal.     Palpations: Abdomen is soft.  Skin:    General: Skin is warm and dry.  Neurological:     Mental Status: She is alert and oriented to person, place, and time. Mental status is at baseline.   Psychiatric:        Mood and Affect: Mood normal.        Behavior: Behavior normal.        Thought Content: Thought content normal.        Judgment: Judgment normal.      LABORATORY DATA:  I have reviewed the labs as listed.  CBC    Component Value Date/Time   WBC 7.0 04/28/2019 1109   RBC 3.25 (L) 04/28/2019 1109   HGB 9.9 (L) 04/28/2019 1109   HCT 31.6 (L)  04/28/2019 1109   PLT 215 04/28/2019 1109   MCV 97.2 04/28/2019 1109   MCH 30.5 04/28/2019 1109   MCHC 31.3 04/28/2019 1109   RDW 14.9 04/28/2019 1109   LYMPHSABS 3.4 04/28/2019 1109   MONOABS 0.6 04/28/2019 1109   EOSABS 0.1 04/28/2019 1109   BASOSABS 0.0 04/28/2019 1109   CMP Latest Ref Rng & Units 04/28/2019 02/12/2019 02/10/2019  Glucose 70 - 99 mg/dL 125(H) 92 128(H)  BUN 8 - 23 mg/dL 22 25(H) 31(H)  Creatinine 0.44 - 1.00 mg/dL 1.59(H) 1.40(H) 1.79(H)  Sodium 135 - 145 mmol/L 143 140 139  Potassium 3.5 - 5.1 mmol/L 4.4 4.3 4.1  Chloride 98 - 111 mmol/L 114(H) 111 109  CO2 22 - 32 mmol/L 21(L) 21(L) 22  Calcium 8.9 - 10.3 mg/dL 8.8(L) 8.4(L) 8.2(L)  Total Protein 6.5 - 8.1 g/dL 7.4 - -  Total Bilirubin 0.3 - 1.2 mg/dL 0.5 - -  Alkaline Phos 38 - 126 U/L 56 - -  AST 15 - 41 U/L 13(L) - -  ALT 0 - 44 U/L 6 - -   I personally performed a face-to-face visit.  All questions were answered to patient's stated satisfaction. Encouraged patient to call with any new concerns or questions before his next visit to the cancer center and we can certain see him sooner, if needed.     ASSESSMENT & PLAN:   Anemia in chronic kidney disease 1.  Iron deficiency anemia: - Anemia felt to be secondary due to chronic kidney disease. -Patient had an EGD revealing erosive gastritis and moderate duodenitis. - Patient was being treated with Aranesp 50 mcg every 8 weeks. - Patient's last Aranesp injection was 06/03/2018. -Labs done on 04/28/2019 showed her hemoglobin 9.9, ferritin 240, percent saturation 30, platelets 215, WBC 7.0.  M  spike not observed.  Creatinine 1.59 -She denies any bright red bleeding per rectum or melena. -We will restart Aranesp injections every 8 weeks.  She will have her first injection today. -Patient will follow-up in 8 weeks with labs.  2.  Vitamin B12 deficiency: - Patient is on monthly B12 injections at home. -Labs on 04/28/2019 showed her B12 level greater than 7500 -We will stop home B12 injections at this time.  We will check her labs at her next visit.  3.  Chronic kidney disease: - Patient has a history of chronic kidney disease. - Labs done on 04/28/2019 showed her creatinine 1.59. -We will continue to monitor.  4.  Vitamin D deficiency: - Labs done on 04/28/2019 showed her vitamin D level 10.4. -We will call her in a prescription for vitamin D 50,000 units weekly. -We will monitor her labs on her next visit.      Orders placed this encounter:  Orders Placed This Encounter  Procedures  . Lactate dehydrogenase  . CBC with Differential/Platelet  . Comprehensive metabolic panel  . Vitamin B12  . VITAMIN D 25 Hydroxy (Vit-D Deficiency, Fractures)      Francene Finders, FNP-C [place] [phone]

## 2019-05-05 NOTE — Patient Instructions (Signed)
[place] at [facility] Discharge Instructions  Follow up in 8 weeks with labs and injection   Thank you for choosing [place] at [facility] to provide your oncology and hematology care.  To afford each patient quality time with our provider, please arrive at least 15 minutes before your scheduled appointment time.   If you have a lab appointment with the [facility] please come in thru the Main Entrance and check in at the main information desk.  You need to re-schedule your appointment should you arrive 10 or more minutes late.  We strive to give you quality time with our providers, and arriving late affects you and other patients whose appointments are after yours.  Also, if you no show three or more times for appointments you may be dismissed from the clinic at the providers discretion.     Again, thank you for choosing [facility].  Our hope is that these requests will decrease the amount of time that you wait before being seen by our physicians.       _____________________________________________________________  Should you have questions after your visit to [facility], please contact our office at [phone] between the hours of 8:00 a.m. and 4:30 p.m.  Voicemails left after 4:00 p.m. will not be returned until the following business day.  For prescription refill requests, have your pharmacy contact our office and allow 72 hours.    Due to Covid, you will need to wear a mask upon entering the hospital. If you do not have a mask, a mask will be given to you at the Main Entrance upon arrival. For doctor visits, patients may have 1 support person with them. For treatment visits, patients can not have anyone with them due to social distancing guidelines and our immunocompromised population.

## 2019-05-25 DIAGNOSIS — E559 Vitamin D deficiency, unspecified: Secondary | ICD-10-CM | POA: Diagnosis not present

## 2019-05-25 DIAGNOSIS — I10 Essential (primary) hypertension: Secondary | ICD-10-CM | POA: Diagnosis not present

## 2019-05-25 DIAGNOSIS — D638 Anemia in other chronic diseases classified elsewhere: Secondary | ICD-10-CM | POA: Diagnosis not present

## 2019-06-30 ENCOUNTER — Inpatient Hospital Stay (HOSPITAL_COMMUNITY): Payer: Medicare Other | Attending: Hematology

## 2019-06-30 ENCOUNTER — Ambulatory Visit (HOSPITAL_COMMUNITY): Payer: Medicare Other

## 2019-06-30 ENCOUNTER — Ambulatory Visit (HOSPITAL_COMMUNITY): Payer: Medicare Other | Admitting: Hematology

## 2019-07-03 DIAGNOSIS — E119 Type 2 diabetes mellitus without complications: Secondary | ICD-10-CM | POA: Diagnosis not present

## 2019-07-14 ENCOUNTER — Inpatient Hospital Stay (HOSPITAL_BASED_OUTPATIENT_CLINIC_OR_DEPARTMENT_OTHER): Payer: Medicare Other | Admitting: Hematology

## 2019-07-14 ENCOUNTER — Inpatient Hospital Stay (HOSPITAL_COMMUNITY): Payer: Medicare Other

## 2019-07-14 ENCOUNTER — Other Ambulatory Visit: Payer: Self-pay

## 2019-07-14 ENCOUNTER — Inpatient Hospital Stay (HOSPITAL_COMMUNITY): Payer: Medicare Other | Attending: Hematology

## 2019-07-14 VITALS — BP 151/70 | HR 98 | Temp 97.4°F | Resp 16 | Wt 138.6 lb

## 2019-07-14 DIAGNOSIS — E538 Deficiency of other specified B group vitamins: Secondary | ICD-10-CM | POA: Insufficient documentation

## 2019-07-14 DIAGNOSIS — E1122 Type 2 diabetes mellitus with diabetic chronic kidney disease: Secondary | ICD-10-CM | POA: Diagnosis not present

## 2019-07-14 DIAGNOSIS — N189 Chronic kidney disease, unspecified: Secondary | ICD-10-CM

## 2019-07-14 DIAGNOSIS — Z79899 Other long term (current) drug therapy: Secondary | ICD-10-CM | POA: Diagnosis not present

## 2019-07-14 DIAGNOSIS — E559 Vitamin D deficiency, unspecified: Secondary | ICD-10-CM | POA: Insufficient documentation

## 2019-07-14 DIAGNOSIS — N183 Chronic kidney disease, stage 3 unspecified: Secondary | ICD-10-CM | POA: Insufficient documentation

## 2019-07-14 DIAGNOSIS — Z7982 Long term (current) use of aspirin: Secondary | ICD-10-CM | POA: Insufficient documentation

## 2019-07-14 DIAGNOSIS — D631 Anemia in chronic kidney disease: Secondary | ICD-10-CM

## 2019-07-14 DIAGNOSIS — I129 Hypertensive chronic kidney disease with stage 1 through stage 4 chronic kidney disease, or unspecified chronic kidney disease: Secondary | ICD-10-CM | POA: Diagnosis not present

## 2019-07-14 DIAGNOSIS — E785 Hyperlipidemia, unspecified: Secondary | ICD-10-CM | POA: Insufficient documentation

## 2019-07-14 DIAGNOSIS — Z8719 Personal history of other diseases of the digestive system: Secondary | ICD-10-CM | POA: Insufficient documentation

## 2019-07-14 DIAGNOSIS — D509 Iron deficiency anemia, unspecified: Secondary | ICD-10-CM | POA: Insufficient documentation

## 2019-07-14 LAB — CBC WITH DIFFERENTIAL/PLATELET
Abs Immature Granulocytes: 0.03 10*3/uL (ref 0.00–0.07)
Basophils Absolute: 0 10*3/uL (ref 0.0–0.1)
Basophils Relative: 0 %
Eosinophils Absolute: 0.1 10*3/uL (ref 0.0–0.5)
Eosinophils Relative: 1 %
HCT: 33.4 % — ABNORMAL LOW (ref 36.0–46.0)
Hemoglobin: 10.5 g/dL — ABNORMAL LOW (ref 12.0–15.0)
Immature Granulocytes: 0 %
Lymphocytes Relative: 48 %
Lymphs Abs: 3.1 10*3/uL (ref 0.7–4.0)
MCH: 31.3 pg (ref 26.0–34.0)
MCHC: 31.4 g/dL (ref 30.0–36.0)
MCV: 99.4 fL (ref 80.0–100.0)
Monocytes Absolute: 0.6 10*3/uL (ref 0.1–1.0)
Monocytes Relative: 8 %
Neutro Abs: 2.9 10*3/uL (ref 1.7–7.7)
Neutrophils Relative %: 43 %
Platelets: 233 10*3/uL (ref 150–400)
RBC: 3.36 MIL/uL — ABNORMAL LOW (ref 3.87–5.11)
RDW: 14.5 % (ref 11.5–15.5)
WBC: 6.8 10*3/uL (ref 4.0–10.5)
nRBC: 0 % (ref 0.0–0.2)

## 2019-07-14 LAB — COMPREHENSIVE METABOLIC PANEL
ALT: 10 U/L (ref 0–44)
AST: 15 U/L (ref 15–41)
Albumin: 3.9 g/dL (ref 3.5–5.0)
Alkaline Phosphatase: 47 U/L (ref 38–126)
Anion gap: 8 (ref 5–15)
BUN: 31 mg/dL — ABNORMAL HIGH (ref 8–23)
CO2: 24 mmol/L (ref 22–32)
Calcium: 9.2 mg/dL (ref 8.9–10.3)
Chloride: 110 mmol/L (ref 98–111)
Creatinine, Ser: 1.64 mg/dL — ABNORMAL HIGH (ref 0.44–1.00)
GFR calc Af Amer: 32 mL/min — ABNORMAL LOW (ref >60)
GFR calc non Af Amer: 28 mL/min — ABNORMAL LOW (ref >60)
Glucose, Bld: 89 mg/dL (ref 70–99)
Potassium: 4.6 mmol/L (ref 3.5–5.1)
Sodium: 142 mmol/L (ref 135–145)
Total Bilirubin: 0.6 mg/dL (ref 0.3–1.2)
Total Protein: 7.3 g/dL (ref 6.5–8.1)

## 2019-07-14 LAB — LACTATE DEHYDROGENASE: LDH: 167 U/L (ref 98–192)

## 2019-07-14 LAB — VITAMIN D 25 HYDROXY (VIT D DEFICIENCY, FRACTURES): Vit D, 25-Hydroxy: 49.79 ng/mL (ref 30–100)

## 2019-07-14 LAB — VITAMIN B12: Vitamin B-12: 567 pg/mL (ref 180–914)

## 2019-07-14 MED ORDER — DARBEPOETIN ALFA 60 MCG/0.3ML IJ SOSY
50.0000 ug | PREFILLED_SYRINGE | Freq: Once | INTRAMUSCULAR | Status: DC
  Filled 2019-07-14: qty 0.3

## 2019-07-14 NOTE — Progress Notes (Signed)
[place], [place] [address]   CLINIC:  Medical Oncology/Hematology  PCP:  [name], MD [address] [place] [address] [phone]   REASON FOR VISIT:  Follow-up for Anemia    CURRENT THERAPY: [place]     INTERVAL HISTORY:  L[place] 85 y.o. female presents today for follow up. Reports over all doing well. Her daughter accompanies her.  She reports overall doing well.  She denies any significant fatigue.  She denies any obvious signs of bleeding.  She denies any chest pain, shortness of breath, lightheadedness or dizziness.  No change in bowel habits.  Appetite is stable.  No weight loss.  She is here for repeat labs and office visit.    REVIEW OF SYSTEMS:  Review of Systems  Constitutional: Negative.   HENT:  Negative.   Eyes: Negative.   Respiratory: Negative.   Cardiovascular: Negative.   Gastrointestinal: Negative.   Endocrine: Negative.   Genitourinary: Negative.    Musculoskeletal: Positive for arthralgias, back pain and gait problem.  Skin: Negative.   Neurological: Positive for extremity weakness and gait problem.  Hematological: Negative.   Psychiatric/Behavioral: Negative.      PAST MEDICAL/SURGICAL HISTORY:  Past Medical History:  Diagnosis Date  . Anemia in chronic kidney disease 01/20/2016  . B12 deficiency anemia 10/25/2015  . Chronic renal disease, stage 3, moderately decreased glomerular filtration rate (GFR) between 30-59 mL/min/1.73 square meter ([place]) 01/07/2016  . Depression   . Essential hypertension   . GI bleed    Severe gastritis and duodenal ulcers by EGD May 2016   . HLD (hyperlipidemia)   . PSVT (paroxysmal supraventricular tachycardia) ([place])   . Septic arthritis (HCC)    MRSA, left knee   . Type 2 diabetes mellitus ([place])    Past Surgical History:  Procedure Laterality Date  . CHOLECYSTECTOMY N/A 11/04/2012   Procedure: LAPAROSCOPIC CHOLECYSTECTOMY;  Surgeon: Jamesetta So, MD;   Location: [facility];  Service: General;  Laterality: N/A;  . ESOPHAGOGASTRODUODENOSCOPY N/A 01/06/2015   SLF: mild esophagitis  . I&D EXTREMITY Left 12/24/2014   Procedure: IRRIGATION AND DEBRIDEMENT EXTREMITY AND ARTHROSCOPY KNEE;  Surgeon: Renette Butters, MD;  Location: [facility];  Service: Orthopedics;  Laterality: Left;  . KNEE SURGERY    . SAVORY DILATION  01/06/2015   Procedure: SAVORY DILATION;  Surgeon: Danie Binder, MD;  [facility];  Service: Endoscopy;;     SOCIAL HISTORY:  Social History   Socioeconomic History  . Marital status: Widowed    Spouse name: Not on file  . Number of children: 57  . Years of education: Not on file  . Highest education level: Not on file  Occupational History  . Occupation: retired; [employer]  . Financial resource strain: Not on file  . Food insecurity    Worry: Not on file    Inability: Not on file  . Transportation needs    Medical: Not on file    Non-medical: Not on file  Tobacco Use  . Smoking status: Never Smoker  . Smokeless tobacco: Never Used  . Tobacco comment: Never smoked  Substance and Sexual Activity  . Alcohol use: No    Alcohol/week: 0.0 standard drinks  . Drug use: No  . Sexual activity: Not on file  Lifestyle  . Physical activity    Days per week: Not on file    Minutes per session: Not on file  .  Stress: Not on file  Relationships  . Social Herbalist on phone: Not on file    Gets together: Not on file    Attends religious service: Not on file    Active member of club or organization: Not on file    Attends meetings of clubs or organizations: Not on file    Relationship status: Not on file  . Intimate partner violence    Fear of current or ex partner: Not on file    Emotionally abused: Not on file    Physically abused: Not on file    Forced sexual activity: Not on file  Other Topics Concern  . Not on file  Social History Narrative   Lives alone   3 daughters &  granddaughter nearby   Lost 1 son-strep          FAMILY HISTORY:  Family History  Family history unknown: Yes    CURRENT MEDICATIONS:  Outpatient Encounter Medications as of 07/14/2019  Medication Sig  . aspirin EC 81 MG EC tablet Take 1 tablet (81 mg total) by mouth daily.  Marland Kitchen CARTIA XT 240 MG 24 hr capsule Take 240 mg by mouth daily.   . cyanocobalamin (,VITAMIN B-12,) 1000 MCG/ML injection ADMINISTER 1 ML(1000 MCG) UNDER THE SKIN EVERY 30 DAYS (Patient taking differently: Inject 1,000 mcg into the muscle every 30 (thirty) days. )  . donepezil (ARICEPT) 10 MG tablet Take 10 mg by mouth at bedtime.   . ergocalciferol (VITAMIN D2) 1.25 MG (50000 UT) capsule Take 1 capsule (50,000 Units total) by mouth once a week.  . losartan (COZAAR) 100 MG tablet Take 100 mg by mouth daily.   . pantoprazole (PROTONIX) 40 MG tablet TAKE 1 TABLET(40 MG) BY MOUTH DAILY before breakfast (Patient taking differently: Take 40 mg by mouth every morning. TAKE 1 TABLET(40 MG) BY MOUTH DAILY before breakfast)   No facility-administered encounter medications on file as of 07/14/2019.     ALLERGIES:  Allergies  Allergen Reactions  . Oxycodone Hives     PHYSICAL EXAM:  ECOG Performance status: 2  Vitals:   07/14/19 1025  BP: (!) 151/70  Pulse: 98  Resp: 16  Temp: (!) 97.4 F (36.3 C)  SpO2: 98%   Filed Weights   07/14/19 1025  Weight: 138 lb 9.6 oz (62.9 kg)    Physical Exam Constitutional:      Appearance: Normal appearance.  HENT:     Head: Normocephalic.     Right Ear: External ear normal.     Left Ear: External ear normal.     Nose: Nose normal.     Mouth/Throat:     Pharynx: Oropharynx is clear.  Eyes:     Conjunctiva/sclera: Conjunctivae normal.  Neck:     Musculoskeletal: Normal range of motion.  Cardiovascular:     Rate and Rhythm: Normal rate and regular rhythm.     Pulses: Normal pulses.     Heart sounds: Normal heart sounds.  Pulmonary:     Effort: Pulmonary effort is  normal.     Breath sounds: Normal breath sounds.  Abdominal:     General: Bowel sounds are normal.  Musculoskeletal:     Comments: Decreased range of motion  Skin:    General: Skin is warm.  Neurological:     General: No focal deficit present.     Mental Status: She is alert and oriented to person, place, and time.  Psychiatric:  Mood and Affect: Mood normal.        Behavior: Behavior normal.        Thought Content: Thought content normal.        Judgment: Judgment normal.      LABORATORY DATA:  I have reviewed the labs as listed.  CBC    Component Value Date/Time   WBC 6.8 07/14/2019 1001   RBC 3.36 (L) 07/14/2019 1001   HGB 10.5 (L) 07/14/2019 1001   HCT 33.4 (L) 07/14/2019 1001   PLT 233 07/14/2019 1001   MCV 99.4 07/14/2019 1001   MCH 31.3 07/14/2019 1001   MCHC 31.4 07/14/2019 1001   RDW 14.5 07/14/2019 1001   LYMPHSABS 3.1 07/14/2019 1001   MONOABS 0.6 07/14/2019 1001   EOSABS 0.1 07/14/2019 1001   BASOSABS 0.0 07/14/2019 1001   CMP Latest Ref Rng & Units 07/14/2019 04/28/2019 02/12/2019  Glucose 70 - 99 mg/dL 89 125(H) 92  BUN 8 - 23 mg/dL 31(H) 22 25(H)  Creatinine 0.44 - 1.00 mg/dL 1.64(H) 1.59(H) 1.40(H)  Sodium 135 - 145 mmol/L 142 143 140  Potassium 3.5 - 5.1 mmol/L 4.6 4.4 4.3  Chloride 98 - 111 mmol/L 110 114(H) 111  CO2 22 - 32 mmol/L 24 21(L) 21(L)  Calcium 8.9 - 10.3 mg/dL 9.2 8.8(L) 8.4(L)  Total Protein 6.5 - 8.1 g/dL 7.3 7.4 -  Total Bilirubin 0.3 - 1.2 mg/dL 0.6 0.5 -  Alkaline Phos 38 - 126 U/L 47 56 -  AST 15 - 41 U/L 15 13(L) -  ALT 0 - 44 U/L 10 6 -          ASSESSMENT & PLAN:   Anemia in chronic kidney disease 1.  Iron deficiency anemia: - Anemia felt to be secondary due to chronic kidney disease. -Patient had an EGD revealing erosive gastritis and moderate duodenitis. - Patient was being treated with Aranesp 50 mcg every 8 weeks. - Patient's last Aranesp injection was 06/03/2018. -Labs done on 04/28/2019 showed her  hemoglobin 9.9, ferritin 240, percent saturation 30, platelets 215, WBC 7.0.  M spike not observed.  Creatinine 1.59 -She denies any bright red bleeding per rectum or melena. - Patient restarted on Aranesp injections every 8 weeks.  -Hemoglobin remained stable at 10.5.  She will receive on her next injection today. -She will return to clinic in 8 weeks.  2.  Vitamin B12 deficiency: - Patient is on monthly B12 injections at home. -Labs on 04/28/2019 showed her B12 level greater than 7500 -We will stop home B12 injections at this time.  We will check her labs at her next visit.  3.  Chronic kidney disease: - Patient has a history of chronic kidney disease. - Labs done on 04/28/2019 showed her creatinine 1.59. -We will continue to monitor.  4.  Vitamin D deficiency: - Labs done on 04/28/2019 showed her vitamin D level 10.4. -We will call her in a prescription for vitamin D 50,000 units weekly. -We will monitor her labs on her next visit.         [place] [phone]

## 2019-07-14 NOTE — Progress Notes (Signed)
Margaretha Seeds presents today for Aranesp injection. Hemoglobin 10.5. Per Harriet Pho, NP we will hold injection today and continue to trend her labs to see if she even needs the injections.

## 2019-07-14 NOTE — Patient Instructions (Addendum)
[facility] at [facility]  Discharge Instructions:   _______________________________________________________________  Thank you for choosing [facility] at [facility] to provide your oncology and hematology care.  To afford each patient quality time with our providers, please arrive at least 15 minutes before your scheduled appointment.  You need to re-schedule your appointment if you arrive 10 or more minutes late.  We strive to give you quality time with our providers, and arriving late affects you and other patients whose appointments are after yours.  Also, if you no show three or more times for appointments you may be dismissed from the clinic.  Again, thank you for choosing [facility] at [facility]. Our hope is that these requests will allow you access to exceptional care and in a timely manner. _______________________________________________________________  If you have questions after your visit, please contact our office at [phone] between the hours of 8:30 a.m. and 5:00 p.m. Voicemails left after 4:30 p.m. will not be returned until the following business day. _______________________________________________________________  For prescription refill requests, have your pharmacy contact our office. _______________________________________________________________  Recommendations made by the consultant and any test results will be sent to your referring physician. _______________________________________________________________ 

## 2019-07-16 NOTE — Assessment & Plan Note (Addendum)
1.  Iron deficiency anemia: - Anemia felt to be secondary due to chronic kidney disease. -Patient had an EGD revealing erosive gastritis and moderate duodenitis. - Patient was being treated with Aranesp 50 mcg every 8 weeks. - Patient's last Aranesp injection was 06/03/2018. -Labs done on 04/28/2019 showed her hemoglobin 9.9, ferritin 240, percent saturation 30, platelets 215, WBC 7.0.  M spike not observed.  Creatinine 1.59 -She denies any bright red bleeding per rectum or melena. - Patient restarted on Aranesp injections every 8 weeks.  -Hemoglobin remained stable at 10.5.  She will receive on her next injection today. -She will return to clinic in 8 weeks.  2.  Vitamin B12 deficiency: - Patient is on monthly B12 injections at home. -Labs on 04/28/2019 showed her B12 level greater than 7500 -We will stop home B12 injections at this time.  We will check her labs at her next visit.  3.  Chronic kidney disease: - Patient has a history of chronic kidney disease. - Labs done on 04/28/2019 showed her creatinine 1.59. -We will continue to monitor.  4.  Vitamin D deficiency: - Labs done on 04/28/2019 showed her vitamin D level 10.4. -We will call her in a prescription for vitamin D 50,000 units weekly. -We will monitor her labs on her next visit.

## 2019-08-31 DIAGNOSIS — D649 Anemia, unspecified: Secondary | ICD-10-CM | POA: Diagnosis not present

## 2019-08-31 DIAGNOSIS — N183 Chronic kidney disease, stage 3 unspecified: Secondary | ICD-10-CM | POA: Diagnosis not present

## 2019-09-07 ENCOUNTER — Other Ambulatory Visit (HOSPITAL_COMMUNITY): Payer: Self-pay

## 2019-09-07 DIAGNOSIS — D631 Anemia in chronic kidney disease: Secondary | ICD-10-CM

## 2019-09-08 ENCOUNTER — Other Ambulatory Visit: Payer: Self-pay

## 2019-09-08 ENCOUNTER — Inpatient Hospital Stay (HOSPITAL_COMMUNITY): Payer: Medicare Other

## 2019-09-08 ENCOUNTER — Inpatient Hospital Stay (HOSPITAL_COMMUNITY): Payer: Medicare Other | Attending: Hematology

## 2019-09-08 DIAGNOSIS — D631 Anemia in chronic kidney disease: Secondary | ICD-10-CM

## 2019-09-08 DIAGNOSIS — N183 Chronic kidney disease, stage 3 unspecified: Secondary | ICD-10-CM | POA: Insufficient documentation

## 2019-09-08 DIAGNOSIS — I129 Hypertensive chronic kidney disease with stage 1 through stage 4 chronic kidney disease, or unspecified chronic kidney disease: Secondary | ICD-10-CM | POA: Diagnosis not present

## 2019-09-08 LAB — CBC
HCT: 34.2 % — ABNORMAL LOW (ref 36.0–46.0)
Hemoglobin: 11.1 g/dL — ABNORMAL LOW (ref 12.0–15.0)
MCH: 32.4 pg (ref 26.0–34.0)
MCHC: 32.5 g/dL (ref 30.0–36.0)
MCV: 99.7 fL (ref 80.0–100.0)
Platelets: 219 10*3/uL (ref 150–400)
RBC: 3.43 MIL/uL — ABNORMAL LOW (ref 3.87–5.11)
RDW: 14.2 % (ref 11.5–15.5)
WBC: 5.4 10*3/uL (ref 4.0–10.5)
nRBC: 0 % (ref 0.0–0.2)

## 2019-09-08 NOTE — Progress Notes (Signed)
Hgb 11.1 today.  Family requesting we do the B12 shot today due to the niece having shoulder surgery and was instructed by Dr. Beverlee Nims office to ask [place] to administer the shot.    Reviewed the last note with Reynolds Bowl, NP, with verbal order the patient does not need B12 injections anymore due to levels with last lab check.  Reviewed with the patient and family with understanding verbalized.

## 2019-09-09 NOTE — Progress Notes (Signed)
REVIEWED-NO ADDITIONAL RECOMMENDATIONS. 

## 2019-10-02 DIAGNOSIS — E119 Type 2 diabetes mellitus without complications: Secondary | ICD-10-CM | POA: Diagnosis not present

## 2019-11-02 ENCOUNTER — Other Ambulatory Visit (HOSPITAL_COMMUNITY): Payer: Self-pay | Admitting: *Deleted

## 2019-11-02 DIAGNOSIS — D631 Anemia in chronic kidney disease: Secondary | ICD-10-CM

## 2019-11-02 DIAGNOSIS — E559 Vitamin D deficiency, unspecified: Secondary | ICD-10-CM

## 2019-11-02 DIAGNOSIS — N183 Chronic kidney disease, stage 3 unspecified: Secondary | ICD-10-CM

## 2019-11-03 ENCOUNTER — Encounter (HOSPITAL_COMMUNITY): Payer: Self-pay | Admitting: Nurse Practitioner

## 2019-11-03 ENCOUNTER — Inpatient Hospital Stay (HOSPITAL_COMMUNITY): Payer: Medicare Other | Attending: Hematology

## 2019-11-03 ENCOUNTER — Other Ambulatory Visit: Payer: Self-pay

## 2019-11-03 ENCOUNTER — Inpatient Hospital Stay (HOSPITAL_COMMUNITY): Payer: Medicare Other

## 2019-11-03 ENCOUNTER — Inpatient Hospital Stay (HOSPITAL_BASED_OUTPATIENT_CLINIC_OR_DEPARTMENT_OTHER): Payer: Medicare Other | Admitting: Nurse Practitioner

## 2019-11-03 DIAGNOSIS — D509 Iron deficiency anemia, unspecified: Secondary | ICD-10-CM | POA: Insufficient documentation

## 2019-11-03 DIAGNOSIS — Z7982 Long term (current) use of aspirin: Secondary | ICD-10-CM | POA: Diagnosis not present

## 2019-11-03 DIAGNOSIS — N183 Chronic kidney disease, stage 3 unspecified: Secondary | ICD-10-CM | POA: Insufficient documentation

## 2019-11-03 DIAGNOSIS — N189 Chronic kidney disease, unspecified: Secondary | ICD-10-CM

## 2019-11-03 DIAGNOSIS — E1122 Type 2 diabetes mellitus with diabetic chronic kidney disease: Secondary | ICD-10-CM | POA: Diagnosis not present

## 2019-11-03 DIAGNOSIS — I129 Hypertensive chronic kidney disease with stage 1 through stage 4 chronic kidney disease, or unspecified chronic kidney disease: Secondary | ICD-10-CM | POA: Insufficient documentation

## 2019-11-03 DIAGNOSIS — E559 Vitamin D deficiency, unspecified: Secondary | ICD-10-CM | POA: Insufficient documentation

## 2019-11-03 DIAGNOSIS — E538 Deficiency of other specified B group vitamins: Secondary | ICD-10-CM | POA: Insufficient documentation

## 2019-11-03 DIAGNOSIS — E785 Hyperlipidemia, unspecified: Secondary | ICD-10-CM | POA: Insufficient documentation

## 2019-11-03 DIAGNOSIS — I471 Supraventricular tachycardia: Secondary | ICD-10-CM | POA: Insufficient documentation

## 2019-11-03 DIAGNOSIS — D631 Anemia in chronic kidney disease: Secondary | ICD-10-CM | POA: Diagnosis not present

## 2019-11-03 DIAGNOSIS — Z79899 Other long term (current) drug therapy: Secondary | ICD-10-CM | POA: Insufficient documentation

## 2019-11-03 LAB — CBC WITH DIFFERENTIAL/PLATELET
Abs Immature Granulocytes: 0.01 10*3/uL (ref 0.00–0.07)
Basophils Absolute: 0 10*3/uL (ref 0.0–0.1)
Basophils Relative: 1 %
Eosinophils Absolute: 0 10*3/uL (ref 0.0–0.5)
Eosinophils Relative: 1 %
HCT: 34.8 % — ABNORMAL LOW (ref 36.0–46.0)
Hemoglobin: 11 g/dL — ABNORMAL LOW (ref 12.0–15.0)
Immature Granulocytes: 0 %
Lymphocytes Relative: 43 %
Lymphs Abs: 2.6 10*3/uL (ref 0.7–4.0)
MCH: 31.6 pg (ref 26.0–34.0)
MCHC: 31.6 g/dL (ref 30.0–36.0)
MCV: 100 fL (ref 80.0–100.0)
Monocytes Absolute: 0.4 10*3/uL (ref 0.1–1.0)
Monocytes Relative: 7 %
Neutro Abs: 2.9 10*3/uL (ref 1.7–7.7)
Neutrophils Relative %: 48 %
Platelets: 216 10*3/uL (ref 150–400)
RBC: 3.48 MIL/uL — ABNORMAL LOW (ref 3.87–5.11)
RDW: 13.8 % (ref 11.5–15.5)
WBC: 6 10*3/uL (ref 4.0–10.5)
nRBC: 0 % (ref 0.0–0.2)

## 2019-11-03 LAB — COMPREHENSIVE METABOLIC PANEL
ALT: 9 U/L (ref 0–44)
AST: 15 U/L (ref 15–41)
Albumin: 3.8 g/dL (ref 3.5–5.0)
Alkaline Phosphatase: 51 U/L (ref 38–126)
Anion gap: 8 (ref 5–15)
BUN: 19 mg/dL (ref 8–23)
CO2: 24 mmol/L (ref 22–32)
Calcium: 9 mg/dL (ref 8.9–10.3)
Chloride: 111 mmol/L (ref 98–111)
Creatinine, Ser: 1.38 mg/dL — ABNORMAL HIGH (ref 0.44–1.00)
GFR calc Af Amer: 40 mL/min — ABNORMAL LOW (ref >60)
GFR calc non Af Amer: 35 mL/min — ABNORMAL LOW (ref >60)
Glucose, Bld: 147 mg/dL — ABNORMAL HIGH (ref 70–99)
Potassium: 3.8 mmol/L (ref 3.5–5.1)
Sodium: 143 mmol/L (ref 135–145)
Total Bilirubin: 0.7 mg/dL (ref 0.3–1.2)
Total Protein: 7.3 g/dL (ref 6.5–8.1)

## 2019-11-03 LAB — VITAMIN B12: Vitamin B-12: 270 pg/mL (ref 180–914)

## 2019-11-03 LAB — VITAMIN D 25 HYDROXY (VIT D DEFICIENCY, FRACTURES): Vit D, 25-Hydroxy: 43.14 ng/mL (ref 30–100)

## 2019-11-03 LAB — LACTATE DEHYDROGENASE: LDH: 155 U/L (ref 98–192)

## 2019-11-03 NOTE — Progress Notes (Signed)
Injection not needed. HGB 11. Randi NP to see patient today.

## 2019-11-03 NOTE — Progress Notes (Signed)
Laura Davenport, [place] [address]   CLINIC:  Medical Oncology/Hematology  PCP:  [name], MD [address] [place] [address] [phone]   REASON FOR VISIT: Follow-up for iron deficiency anemia  CURRENT THERAPY: Intermittent iron infusions    INTERVAL HISTORY:  Laura Davenport 86 y.o. female returns for routine follow-up for iron deficiency anemia.  Patient reports she is doing well since her last visit.  She denies any bright red bleeding per rectum or melena.  She denies any easy bruising or bleeding. Denies any nausea, vomiting, or diarrhea. Denies any new pains. Had not noticed any recent bleeding such as epistaxis, hematuria or hematochezia. Denies recent chest pain on exertion, shortness of breath on minimal exertion, pre-syncopal episodes, or palpitations. Denies any numbness or tingling in hands or feet. Denies any recent fevers, infections, or recent hospitalizations. Patient reports appetite at 100% and energy level at 100%.  She is eating well and maintaining her weight at this time.    REVIEW OF SYSTEMS:  Review of Systems  All other systems reviewed and are negative.    PAST MEDICAL/SURGICAL HISTORY:  Past Medical History:  Diagnosis Date  . Anemia in chronic kidney disease 01/20/2016  . B12 deficiency anemia 10/25/2015  . Chronic renal disease, stage 3, moderately decreased glomerular filtration rate (GFR) between 30-59 mL/min/1.73 square meter 01/07/2016  . Depression   . Essential hypertension   . GI bleed    Severe gastritis and duodenal ulcers by EGD May 2016   . HLD (hyperlipidemia)   . PSVT (paroxysmal supraventricular tachycardia) ([place])   . Septic arthritis (HCC)    MRSA, left knee   . Type 2 diabetes mellitus ([facility])    Past Surgical History:  Procedure Laterality Date  . CHOLECYSTECTOMY N/A 11/04/2012   Procedure: LAPAROSCOPIC CHOLECYSTECTOMY;  Surgeon: Jamesetta So, MD;  Location: [facility];  Service:  General;  Laterality: N/A;  . ESOPHAGOGASTRODUODENOSCOPY N/A 01/06/2015   SLF: mild esophagitis  . I & D EXTREMITY Left 12/24/2014   Procedure: IRRIGATION AND DEBRIDEMENT EXTREMITY AND ARTHROSCOPY KNEE;  Surgeon: Renette Butters, MD;  Location: [facility];  Service: Orthopedics;  Laterality: Left;  . KNEE SURGERY    . SAVORY DILATION  01/06/2015   Procedure: SAVORY DILATION;  Surgeon: Danie Binder, MD;  Location: [facility];  Service: Endoscopy;;     SOCIAL HISTORY:  Social History   Socioeconomic History  . Marital status: Widowed    Spouse name: Not on file  . Number of children: 80  . Years of education: Not on file  . Highest education level: Not on file  Occupational History  . Occupation: retired; Education officer, museum  Tobacco Use  . Smoking status: Never Smoker  . Smokeless tobacco: Never Used  . Tobacco comment: Never smoked  Substance and Sexual Activity  . Alcohol use: No    Alcohol/week: 0.0 standard drinks  . Drug use: No  . Sexual activity: Not on file  Other Topics Concern  . Not on file  Social History Narrative   Lives alone   3 daughters & granddaughter nearby   Lost 1 son-strep         Social Determinants of Health   Financial Resource Strain:   . Difficulty of Paying Living Expenses: Not on file  Food Insecurity:   . Worried About Charity fundraiser in the Last Year: Not on file  . Ran Out of Food in the Last  Year: Not on file  Transportation Needs:   . Lack of Transportation (Medical): Not on file  . Lack of Transportation (Non-Medical): Not on file  Physical Activity:   . Days of Exercise per Week: Not on file  . Minutes of Exercise per Session: Not on file  Stress:   . Feeling of Stress : Not on file  Social Connections:   . Frequency of Communication with Friends and Family: Not on file  . Frequency of Social Gatherings with Friends and Family: Not on file  . Attends Religious Services: Not on file  . Active Member of Clubs or Organizations: Not  on file  . Attends Archivist Meetings: Not on file  . Marital Status: Not on file  Intimate Partner Violence:   . Fear of Current or Ex-Partner: Not on file  . Emotionally Abused: Not on file  . Physically Abused: Not on file  . Sexually Abused: Not on file    FAMILY HISTORY:  Family History  Family history unknown: Yes    CURRENT MEDICATIONS:  Outpatient Encounter Medications as of 11/03/2019  Medication Sig  . aspirin EC 81 MG EC tablet Take 1 tablet (81 mg total) by mouth daily.  Marland Kitchen CARTIA XT 240 MG 24 hr capsule Take 240 mg by mouth daily.   Marland Kitchen donepezil (ARICEPT) 10 MG tablet Take 10 mg by mouth at bedtime.   Marland Kitchen losartan (COZAAR) 100 MG tablet Take 100 mg by mouth daily.   . pantoprazole (PROTONIX) 40 MG tablet TAKE 1 TABLET(40 MG) BY MOUTH DAILY before breakfast (Patient taking differently: Take 40 mg by mouth every morning. TAKE 1 TABLET(40 MG) BY MOUTH DAILY before breakfast)  . cyanocobalamin (,VITAMIN B-12,) 1000 MCG/ML injection ADMINISTER 1 ML(1000 MCG) UNDER THE SKIN EVERY 30 DAYS (Patient not taking: No sig reported)  . [DISCONTINUED] ergocalciferol (VITAMIN D2) 1.25 MG (50000 UT) capsule Take 1 capsule (50,000 Units total) by mouth once a week.   No facility-administered encounter medications on file as of 11/03/2019.    ALLERGIES:  Allergies  Allergen Reactions  . Oxycodone Hives     PHYSICAL EXAM:  ECOG Performance status: 1  Vitals:   11/03/19 1132  BP: (!) 135/49  Pulse: 96  Resp: 18  Temp: (!) 97.1 F (36.2 C)  SpO2: 100%   Filed Weights   11/03/19 1132  Weight: 131 lb 6.4 oz (59.6 kg)    Physical Exam Constitutional:      Appearance: Normal appearance. She is normal weight.  Cardiovascular:     Rate and Rhythm: Normal rate and regular rhythm.     Heart sounds: Normal heart sounds.  Pulmonary:     Effort: Pulmonary effort is normal.     Breath sounds: Normal breath sounds.  Abdominal:     General: Bowel sounds are normal.      Palpations: Abdomen is soft.  Musculoskeletal:        General: Normal range of motion.  Skin:    General: Skin is warm.  Neurological:     Mental Status: She is alert and oriented to person, place, and time. Mental status is at baseline.  Psychiatric:        Mood and Affect: Mood normal.        Behavior: Behavior normal.        Thought Content: Thought content normal.        Judgment: Judgment normal.      LABORATORY DATA:  I have reviewed the labs as listed.  CBC    Component Value Date/Time   WBC 6.0 11/03/2019 1005   RBC 3.48 (L) 11/03/2019 1005   HGB 11.0 (L) 11/03/2019 1005   HCT 34.8 (L) 11/03/2019 1005   PLT 216 11/03/2019 1005   MCV 100.0 11/03/2019 1005   MCH 31.6 11/03/2019 1005   MCHC 31.6 11/03/2019 1005   RDW 13.8 11/03/2019 1005   LYMPHSABS 2.6 11/03/2019 1005   MONOABS 0.4 11/03/2019 1005   EOSABS 0.0 11/03/2019 1005   BASOSABS 0.0 11/03/2019 1005   CMP Latest Ref Rng & Units 11/03/2019 07/14/2019 04/28/2019  Glucose 70 - 99 mg/dL 147(H) 89 125(H)  BUN 8 - 23 mg/dL 19 31(H) 22  Creatinine 0.44 - 1.00 mg/dL 1.38(H) 1.64(H) 1.59(H)  Sodium 135 - 145 mmol/L 143 142 143  Potassium 3.5 - 5.1 mmol/L 3.8 4.6 4.4  Chloride 98 - 111 mmol/L 111 110 114(H)  CO2 22 - 32 mmol/L 24 24 21(L)  Calcium 8.9 - 10.3 mg/dL 9.0 9.2 8.8(L)  Total Protein 6.5 - 8.1 g/dL 7.3 7.3 7.4  Total Bilirubin 0.3 - 1.2 mg/dL 0.7 0.6 0.5  Alkaline Phos 38 - 126 U/L 51 47 56  AST 15 - 41 U/L 15 15 13(L)  ALT 0 - 44 U/L 9 10 6      I personally performed a face-to-face visit.  All questions were answered to patient's stated satisfaction. Encouraged patient to call with any new concerns or questions before his next visit to the cancer center and we can certain see him sooner, if needed.     ASSESSMENT & PLAN:   Anemia in chronic kidney disease 1.  Iron deficiency anemia: - Anemia felt to be secondary due to chronic kidney disease. -Patient had an EGD revealing erosive gastritis and  moderate duodenitis. - Patient was being treated with Aranesp 50 mcg every 8 weeks. - Patient's last Aranesp injection was 05/05/2019 -Labs done on 04/28/2019 showed her hemoglobin 9.9, ferritin 240, percent saturation 30, platelets 215, WBC 7.0.  M spike not observed.  Creatinine 1.59 -She denies any bright red bleeding per rectum or melena. -Labs done on 11/03/2019 showed hemoglobin 11.0, creatinine 1.38. -Aranesp injections was changed to every 12 weeks 50 mcg. -She did not need her injection today. -Patient will follow-up in 12 weeks with labs and injection.  2.  Vitamin B12 deficiency: - Patient is on monthly B12 injections at home. -Labs on 04/28/2019 showed her B12 level greater than 7500 -We will stop home B12 injections at this time.   -Labs done on 11/03/2019 showed vitamin B12 level has dropped to 270. -Patient will start on oral B12 2.5 mcg daily. -We will recheck her labs at her next visit.  3.  Chronic kidney disease: - Patient has a history of chronic kidney disease. - Labs done on 11/03/2019 showed her creatinine 1.38. -We will continue to monitor.  4.  Vitamin D deficiency: - Labs done on 04/28/2019 showed her vitamin D level 10.4. -We will call her in a prescription for vitamin D 50,000 units weekly. -We will monitor her labs on her next visit.      Orders placed this encounter:  Orders Placed This Encounter  Procedures  . Lactate dehydrogenase  . CBC with Differential/Platelet  . Comprehensive metabolic panel  . Ferritin  . Iron and TIBC  . Vitamin B12  . VITAMIN D 25 Hydroxy (Vit-D Deficiency, Fractures)  . Folate     Francene Finders, FNP-C [place] [phone]

## 2019-11-03 NOTE — Assessment & Plan Note (Addendum)
1.  Iron deficiency anemia: - Anemia felt to be secondary due to chronic kidney disease. -Patient had an EGD revealing erosive gastritis and moderate duodenitis. - Patient was being treated with Aranesp 50 mcg every 8 weeks. - Patient's last Aranesp injection was 05/05/2019 -Labs done on 04/28/2019 showed her hemoglobin 9.9, ferritin 240, percent saturation 30, platelets 215, WBC 7.0.  M spike not observed.  Creatinine 1.59 -She denies any bright red bleeding per rectum or melena. -Labs done on 11/03/2019 showed hemoglobin 11.0, creatinine 1.38. -Aranesp injections was changed to every 12 weeks 50 mcg. -She did not need her injection today. -Patient will follow-up in 12 weeks with labs and injection.  2.  Vitamin B12 deficiency: - Patient is on monthly B12 injections at home. -Labs on 04/28/2019 showed her B12 level greater than 7500 -We will stop home B12 injections at this time.   -Labs done on 11/03/2019 showed vitamin B12 level has dropped to 270. -Patient will start on oral B12 2.5 mcg daily. -We will recheck her labs at her next visit.  3.  Chronic kidney disease: - Patient has a history of chronic kidney disease. - Labs done on 11/03/2019 showed her creatinine 1.38. -We will continue to monitor.  4.  Vitamin D deficiency: - Labs done on 04/28/2019 showed her vitamin D level 10.4. -We will call her in a prescription for vitamin D 50,000 units weekly. -We will monitor her labs on her next visit.

## 2019-11-03 NOTE — Patient Instructions (Signed)
[facility] at [facility] Discharge Instructions  Follow up in 12 weeks with labs and injection   Thank you for choosing [facility] at [facility] to provide your oncology and hematology care.  To afford each patient quality time with our provider, please arrive at least 15 minutes before your scheduled appointment time.   If you have a lab appointment with the Cancer Center please come in thru the Main Entrance and check in at the main information desk.  You need to re-schedule your appointment should you arrive 10 or more minutes late.  We strive to give you quality time with our providers, and arriving late affects you and other patients whose appointments are after yours.  Also, if you no show three or more times for appointments you may be dismissed from the clinic at the providers discretion.     Again, thank you for choosing [facility].  Our hope is that these requests will decrease the amount of time that you wait before being seen by our physicians.       _____________________________________________________________  Should you have questions after your visit to  Cancer Center, please contact our office at [phone] between the hours of 8:00 a.m. and 4:30 p.m.  Voicemails left after 4:00 p.m. will not be returned until the following business day.  For prescription refill requests, have your pharmacy contact our office and allow 72 hours.    Due to Covid, you will need to wear a mask upon entering the hospital. If you do not have a mask, a mask will be given to you at the Main Entrance upon arrival. For doctor visits, patients may have 1 support person with them. For treatment visits, patients can not have anyone with them due to social distancing guidelines and our immunocompromised population.      

## 2019-11-06 ENCOUNTER — Other Ambulatory Visit: Payer: Self-pay | Admitting: Nurse Practitioner

## 2019-11-29 DIAGNOSIS — L230 Allergic contact dermatitis due to metals: Secondary | ICD-10-CM | POA: Diagnosis not present

## 2019-11-29 DIAGNOSIS — E1122 Type 2 diabetes mellitus with diabetic chronic kidney disease: Secondary | ICD-10-CM | POA: Diagnosis not present

## 2019-11-29 DIAGNOSIS — N183 Chronic kidney disease, stage 3 unspecified: Secondary | ICD-10-CM | POA: Diagnosis not present

## 2019-12-29 ENCOUNTER — Other Ambulatory Visit (HOSPITAL_COMMUNITY): Payer: Medicare Other

## 2019-12-29 ENCOUNTER — Ambulatory Visit (HOSPITAL_COMMUNITY): Payer: Medicare Other

## 2020-01-01 DIAGNOSIS — E119 Type 2 diabetes mellitus without complications: Secondary | ICD-10-CM | POA: Diagnosis not present

## 2020-01-15 ENCOUNTER — Other Ambulatory Visit: Payer: Self-pay | Admitting: Gastroenterology

## 2020-01-15 DIAGNOSIS — K219 Gastro-esophageal reflux disease without esophagitis: Secondary | ICD-10-CM

## 2020-01-26 ENCOUNTER — Inpatient Hospital Stay (HOSPITAL_COMMUNITY): Payer: Medicare Other | Attending: Hematology

## 2020-01-26 ENCOUNTER — Other Ambulatory Visit: Payer: Self-pay

## 2020-01-26 ENCOUNTER — Inpatient Hospital Stay (HOSPITAL_BASED_OUTPATIENT_CLINIC_OR_DEPARTMENT_OTHER): Payer: Medicare Other | Admitting: Nurse Practitioner

## 2020-01-26 ENCOUNTER — Inpatient Hospital Stay (HOSPITAL_COMMUNITY): Payer: Medicare Other

## 2020-01-26 ENCOUNTER — Encounter (HOSPITAL_COMMUNITY): Payer: Self-pay | Admitting: Nurse Practitioner

## 2020-01-26 DIAGNOSIS — K2960 Other gastritis without bleeding: Secondary | ICD-10-CM | POA: Insufficient documentation

## 2020-01-26 DIAGNOSIS — N183 Chronic kidney disease, stage 3 unspecified: Secondary | ICD-10-CM | POA: Diagnosis not present

## 2020-01-26 DIAGNOSIS — I129 Hypertensive chronic kidney disease with stage 1 through stage 4 chronic kidney disease, or unspecified chronic kidney disease: Secondary | ICD-10-CM | POA: Insufficient documentation

## 2020-01-26 DIAGNOSIS — I471 Supraventricular tachycardia: Secondary | ICD-10-CM | POA: Diagnosis not present

## 2020-01-26 DIAGNOSIS — Z8614 Personal history of Methicillin resistant Staphylococcus aureus infection: Secondary | ICD-10-CM | POA: Diagnosis not present

## 2020-01-26 DIAGNOSIS — E538 Deficiency of other specified B group vitamins: Secondary | ICD-10-CM | POA: Insufficient documentation

## 2020-01-26 DIAGNOSIS — E559 Vitamin D deficiency, unspecified: Secondary | ICD-10-CM | POA: Insufficient documentation

## 2020-01-26 DIAGNOSIS — Z79899 Other long term (current) drug therapy: Secondary | ICD-10-CM | POA: Insufficient documentation

## 2020-01-26 DIAGNOSIS — N189 Chronic kidney disease, unspecified: Secondary | ICD-10-CM

## 2020-01-26 DIAGNOSIS — E785 Hyperlipidemia, unspecified: Secondary | ICD-10-CM | POA: Insufficient documentation

## 2020-01-26 DIAGNOSIS — E119 Type 2 diabetes mellitus without complications: Secondary | ICD-10-CM | POA: Insufficient documentation

## 2020-01-26 DIAGNOSIS — Z7982 Long term (current) use of aspirin: Secondary | ICD-10-CM | POA: Insufficient documentation

## 2020-01-26 DIAGNOSIS — D631 Anemia in chronic kidney disease: Secondary | ICD-10-CM | POA: Diagnosis not present

## 2020-01-26 DIAGNOSIS — K2980 Duodenitis without bleeding: Secondary | ICD-10-CM | POA: Diagnosis not present

## 2020-01-26 DIAGNOSIS — D509 Iron deficiency anemia, unspecified: Secondary | ICD-10-CM | POA: Insufficient documentation

## 2020-01-26 LAB — COMPREHENSIVE METABOLIC PANEL
ALT: 10 U/L (ref 0–44)
AST: 15 U/L (ref 15–41)
Albumin: 3.8 g/dL (ref 3.5–5.0)
Alkaline Phosphatase: 53 U/L (ref 38–126)
Anion gap: 9 (ref 5–15)
BUN: 21 mg/dL (ref 8–23)
CO2: 24 mmol/L (ref 22–32)
Calcium: 9.1 mg/dL (ref 8.9–10.3)
Chloride: 109 mmol/L (ref 98–111)
Creatinine, Ser: 1.64 mg/dL — ABNORMAL HIGH (ref 0.44–1.00)
GFR calc Af Amer: 32 mL/min — ABNORMAL LOW (ref >60)
GFR calc non Af Amer: 28 mL/min — ABNORMAL LOW (ref >60)
Glucose, Bld: 103 mg/dL — ABNORMAL HIGH (ref 70–99)
Potassium: 3.9 mmol/L (ref 3.5–5.1)
Sodium: 142 mmol/L (ref 135–145)
Total Bilirubin: 1 mg/dL (ref 0.3–1.2)
Total Protein: 7.2 g/dL (ref 6.5–8.1)

## 2020-01-26 LAB — CBC WITH DIFFERENTIAL/PLATELET
Abs Immature Granulocytes: 0.02 10*3/uL (ref 0.00–0.07)
Basophils Absolute: 0 10*3/uL (ref 0.0–0.1)
Basophils Relative: 1 %
Eosinophils Absolute: 0.1 10*3/uL (ref 0.0–0.5)
Eosinophils Relative: 1 %
HCT: 34.1 % — ABNORMAL LOW (ref 36.0–46.0)
Hemoglobin: 10.7 g/dL — ABNORMAL LOW (ref 12.0–15.0)
Immature Granulocytes: 0 %
Lymphocytes Relative: 55 %
Lymphs Abs: 3.1 10*3/uL (ref 0.7–4.0)
MCH: 30.2 pg (ref 26.0–34.0)
MCHC: 31.4 g/dL (ref 30.0–36.0)
MCV: 96.3 fL (ref 80.0–100.0)
Monocytes Absolute: 0.5 10*3/uL (ref 0.1–1.0)
Monocytes Relative: 8 %
Neutro Abs: 2 10*3/uL (ref 1.7–7.7)
Neutrophils Relative %: 35 %
Platelets: 211 10*3/uL (ref 150–400)
RBC: 3.54 MIL/uL — ABNORMAL LOW (ref 3.87–5.11)
RDW: 13.9 % (ref 11.5–15.5)
WBC: 5.6 10*3/uL (ref 4.0–10.5)
nRBC: 0 % (ref 0.0–0.2)

## 2020-01-26 LAB — VITAMIN D 25 HYDROXY (VIT D DEFICIENCY, FRACTURES): Vit D, 25-Hydroxy: 33.45 ng/mL (ref 30–100)

## 2020-01-26 LAB — VITAMIN B12: Vitamin B-12: 970 pg/mL — ABNORMAL HIGH (ref 180–914)

## 2020-01-26 LAB — IRON AND TIBC
Iron: 80 ug/dL (ref 28–170)
Saturation Ratios: 35 % — ABNORMAL HIGH (ref 10.4–31.8)
TIBC: 231 ug/dL — ABNORMAL LOW (ref 250–450)
UIBC: 151 ug/dL

## 2020-01-26 LAB — FERRITIN: Ferritin: 205 ng/mL (ref 11–307)

## 2020-01-26 LAB — LACTATE DEHYDROGENASE: LDH: 154 U/L (ref 98–192)

## 2020-01-26 LAB — FOLATE: Folate: 20.4 ng/mL (ref >5.9)

## 2020-01-26 MED ORDER — DARBEPOETIN ALFA 60 MCG/0.3ML IJ SOSY
50.0000 ug | PREFILLED_SYRINGE | Freq: Once | INTRAMUSCULAR | Status: AC
  Administered 2020-01-26: 50 ug via SUBCUTANEOUS
  Filled 2020-01-26: qty 0.3

## 2020-01-26 NOTE — Patient Instructions (Signed)
[place] at [facility] Discharge Instructions  Received Aranesp injection today. Follow-up as scheduled.   Thank you for choosing [place] at [facility] to provide your oncology and hematology care.  To afford each patient quality time with our provider, please arrive at least 15 minutes before your scheduled appointment time.   If you have a lab appointment with the [facility] please come in thru the Main Entrance and check in at the main information desk.  You need to re-schedule your appointment should you arrive 10 or more minutes late.  We strive to give you quality time with our providers, and arriving late affects you and other patients whose appointments are after yours.  Also, if you no show three or more times for appointments you may be dismissed from the clinic at the providers discretion.     Again, thank you for choosing [facility].  Our hope is that these requests will decrease the amount of time that you wait before being seen by our physicians.       _____________________________________________________________  Should you have questions after your visit to [facility], please contact our office at [phone] between the hours of 8:00 a.m. and 4:30 p.m.  Voicemails left after 4:00 p.m. will not be returned until the following business day.  For prescription refill requests, have your pharmacy contact our office and allow 72 hours.    Due to Covid, you will need to wear a mask upon entering the hospital. If you do not have a mask, a mask will be given to you at the Main Entrance upon arrival. For doctor visits, patients may have 1 support person with them. For treatment visits, patients can not have anyone with them due to social distancing guidelines and our immunocompromised population.

## 2020-01-26 NOTE — Patient Instructions (Signed)
[place] at [facility] Discharge Instructions  Follow up in 12 weeks with labs and injection   Thank you for choosing [place] at [facility] to provide your oncology and hematology care.  To afford each patient quality time with our provider, please arrive at least 15 minutes before your scheduled appointment time.   If you have a lab appointment with the [facility] please come in thru the Main Entrance and check in at the main information desk.  You need to re-schedule your appointment should you arrive 10 or more minutes late.  We strive to give you quality time with our providers, and arriving late affects you and other patients whose appointments are after yours.  Also, if you no show three or more times for appointments you may be dismissed from the clinic at the providers discretion.     Again, thank you for choosing [facility].  Our hope is that these requests will decrease the amount of time that you wait before being seen by our physicians.       _____________________________________________________________  Should you have questions after your visit to [facility], please contact our office at [phone] between the hours of 8:00 a.m. and 4:30 p.m.  Voicemails left after 4:00 p.m. will not be returned until the following business day.  For prescription refill requests, have your pharmacy contact our office and allow 72 hours.    Due to Covid, you will need to wear a mask upon entering the hospital. If you do not have a mask, a mask will be given to you at the Main Entrance upon arrival. For doctor visits, patients may have 1 support person with them. For treatment visits, patients can not have anyone with them due to social distancing guidelines and our immunocompromised population.

## 2020-01-26 NOTE — Progress Notes (Signed)
[place], [place] [address]   CLINIC:  Medical Oncology/Hematology  PCP:  [name], [place] [address] [phone]   REASON FOR VISIT: Follow-up for iron deficiency anemia related to CKD.   CURRENT THERAPY: Aranesp injections every 12 weeks   INTERVAL HISTORY:  Laura Davenport 86 y.o. female returns for routine follow-up for iron deficiency anemia related to CKD.  Patient reports she has been doing well since her last visit.  She reports her energy levels are staying good.  She denies any bright red bleeding per rectum or melena.  She denies any easy bruising or bleeding. Denies any nausea, vomiting, or diarrhea. Denies any new pains. Had not noticed any recent bleeding such as epistaxis, hematuria or hematochezia. Denies recent chest pain on exertion, shortness of breath on minimal exertion, pre-syncopal episodes, or palpitations. Denies any numbness or tingling in hands or feet. Denies any recent fevers, infections, or recent hospitalizations. Patient reports appetite at 75% and energy level at 100%.  She is eating well maintain her weight at this time.     REVIEW OF SYSTEMS:  Review of Systems  All other systems reviewed and are negative.    PAST MEDICAL/SURGICAL HISTORY:  Past Medical History:  Diagnosis Date  . Anemia in chronic kidney disease 01/20/2016  . B12 deficiency anemia 10/25/2015  . Chronic renal disease, stage 3, moderately decreased glomerular filtration rate (GFR) between 30-59 mL/min/1.73 square meter 01/07/2016  . Depression   . Essential hypertension   . GI bleed    Severe gastritis and duodenal ulcers by EGD May 2016   . HLD (hyperlipidemia)   . PSVT (paroxysmal supraventricular tachycardia) ([place])   . Septic arthritis (HCC)    MRSA, left knee   . Type 2 diabetes mellitus ([place])    Past Surgical History:  Procedure Laterality Date  . CHOLECYSTECTOMY N/A 11/04/2012   Procedure:  LAPAROSCOPIC CHOLECYSTECTOMY;  Surgeon: Jamesetta So, MD;  Location: [facility];  Service: General;  Laterality: N/A;  . ESOPHAGOGASTRODUODENOSCOPY N/A 01/06/2015   SLF: mild esophagitis  . I & D EXTREMITY Left 12/24/2014   Procedure: IRRIGATION AND DEBRIDEMENT EXTREMITY AND ARTHROSCOPY KNEE;  Surgeon: Renette Butters, MD;  Location: [facility];  Service: Orthopedics;  Laterality: Left;  . KNEE SURGERY    . SAVORY DILATION  01/06/2015   Procedure: SAVORY DILATION;  Surgeon: Danie Binder, MD;  [facility];  Service: Endoscopy;;     SOCIAL HISTORY:  Social History   Socioeconomic History  . Marital status: Widowed    Spouse name: Not on file  . Number of children: 54  . Years of education: Not on file  . Highest education level: Not on file  Occupational History  . Occupation: retired; Education officer, museum  Tobacco Use  . Smoking status: Never Smoker  . Smokeless tobacco: Never Used  . Tobacco comment: Never smoked  Substance and Sexual Activity  . Alcohol use: No    Alcohol/week: 0.0 standard drinks  . Drug use: No  . Sexual activity: Not on file  Other Topics Concern  . Not on file  Social History Narrative   Lives alone   3 daughters & granddaughter nearby   Lost 1 son-strep         Social Determinants of Health   Financial Resource Strain:   . Difficulty of Paying Living Expenses:   Food Insecurity:   . Worried About Charity fundraiser  in the Last Year:   . [place] in the Last Year:   Transportation Needs:   . Film/video editor (Medical):   Marland Kitchen Lack of Transportation (Non-Medical):   Physical Activity:   . Days of Exercise per Week:   . Minutes of Exercise per Session:   Stress:   . Feeling of Stress :   Social Connections:   . Frequency of Communication with Friends and Family:   . Frequency of Social Gatherings with Friends and Family:   . Attends Religious Services:   . Active Member of Clubs or Organizations:   . Attends Archivist  Meetings:   Marland Kitchen Marital Status:   Intimate Partner Violence:   . Fear of Current or Ex-Partner:   . Emotionally Abused:   Marland Kitchen Physically Abused:   . Sexually Abused:     FAMILY HISTORY:  Family History  Family history unknown: Yes    CURRENT MEDICATIONS:  Outpatient Encounter Medications as of 01/26/2020  Medication Sig  . aspirin EC 81 MG EC tablet Take 1 tablet (81 mg total) by mouth daily.  Marland Kitchen CARTIA XT 240 MG 24 hr capsule Take 240 mg by mouth daily.   Marland Kitchen donepezil (ARICEPT) 10 MG tablet Take 10 mg by mouth at bedtime.   Marland Kitchen losartan (COZAAR) 100 MG tablet Take 100 mg by mouth daily.   . pantoprazole (PROTONIX) 40 MG tablet TAKE 1 TABLET(40 MG) BY MOUTH DAILY BEFORE BREAKFAST  . vitamin B-12 (CYANOCOBALAMIN) 250 MCG tablet Take 250 mcg by mouth daily.  . [DISCONTINUED] cyanocobalamin (,VITAMIN B-12,) 1000 MCG/ML injection ADMINISTER 1 ML(1000 MCG) UNDER THE SKIN EVERY 30 DAYS (Patient not taking: No sig reported)   No facility-administered encounter medications on file as of 01/26/2020.    ALLERGIES:  Allergies  Allergen Reactions  . Oxycodone Hives     PHYSICAL EXAM:  ECOG Performance status: 1  Vitals:   01/26/20 1108  BP: (!) 155/69  Pulse: 96  Resp: 19  Temp: (!) 96.6 F (35.9 C)  SpO2: 100%   Filed Weights   01/26/20 1108  Weight: 121 lb 1.6 oz (54.9 kg)   Physical Exam Constitutional:      Appearance: Normal appearance. She is normal weight.  Cardiovascular:     Rate and Rhythm: Normal rate and regular rhythm.     Heart sounds: Normal heart sounds.  Pulmonary:     Effort: Pulmonary effort is normal.     Breath sounds: Normal breath sounds.  Abdominal:     General: Bowel sounds are normal.     Palpations: Abdomen is soft.  Musculoskeletal:        General: Normal range of motion.  Skin:    General: Skin is warm.  Neurological:     Mental Status: She is alert and oriented to person, place, and time. Mental status is at baseline.  Psychiatric:         Mood and Affect: Mood normal.        Behavior: Behavior normal.        Thought Content: Thought content normal.        Judgment: Judgment normal.      LABORATORY DATA:  I have reviewed the labs as listed.  CBC    Component Value Date/Time   WBC 5.6 01/26/2020 1004   RBC 3.54 (L) 01/26/2020 1004   HGB 10.7 (L) 01/26/2020 1004   HCT 34.1 (L) 01/26/2020 1004   PLT 211 01/26/2020 1004   MCV 96.3  01/26/2020 1004   MCH 30.2 01/26/2020 1004   MCHC 31.4 01/26/2020 1004   RDW 13.9 01/26/2020 1004   LYMPHSABS 3.1 01/26/2020 1004   MONOABS 0.5 01/26/2020 1004   EOSABS 0.1 01/26/2020 1004   BASOSABS 0.0 01/26/2020 1004   CMP Latest Ref Rng & Units 01/26/2020 11/03/2019 07/14/2019  Glucose 70 - 99 mg/dL 103(H) 147(H) 89  BUN 8 - 23 mg/dL 21 19 31(H)  Creatinine 0.44 - 1.00 mg/dL 1.64(H) 1.38(H) 1.64(H)  Sodium 135 - 145 mmol/L 142 143 142  Potassium 3.5 - 5.1 mmol/L 3.9 3.8 4.6  Chloride 98 - 111 mmol/L 109 111 110  CO2 22 - 32 mmol/L 24 24 24   Calcium 8.9 - 10.3 mg/dL 9.1 9.0 9.2  Total Protein 6.5 - 8.1 g/dL 7.2 7.3 7.3  Total Bilirubin 0.3 - 1.2 mg/dL 1.0 0.7 0.6  Alkaline Phos 38 - 126 U/L 53 51 47  AST 15 - 41 U/L 15 15 15   ALT 0 - 44 U/L 10 9 10    All questions were answered to patient's stated satisfaction. Encouraged patient to call with any new concerns or questions before his next visit to the cancer center and we can certain see him sooner, if needed.     ASSESSMENT & PLAN:  Anemia in chronic kidney disease 1.  Iron deficiency anemia: - Anemia felt to be secondary due to chronic kidney disease. -Patient had an EGD revealing erosive gastritis and moderate duodenitis. - Patient was being treated with Aranesp 50 mcg every 8 weeks. - Patient's last Aranesp injection was 05/05/2019 -Work-up labs done on 04/28/2019 showed her hemoglobin 9.9, ferritin 240, percent saturation 30, platelets 215, WBC 7.0.  M spike not observed.  Creatinine 1.59 -She denies any bright red bleeding  per rectum or melena. -Labs done on 01/26/2020 showed hemoglobin 10.7, creatinine 1.64 -We will continue Aranesp injections every 12 weeks 50 mcg. -She will get her injection today. -Patient will follow-up in 12 weeks with labs and injection.  2.  Vitamin B12 deficiency: - Patient is on monthly B12 injections at home. -Labs on 04/28/2019 showed her B12 level greater than 7500 -We will stop home B12 injections at this time.   -Labs done on 11/03/2019 showed vitamin B12 level has dropped to 270. -Patient will start on oral B12 2.5 mcg daily. -Follow-up labs on 01/26/2020 showed her B12 has improved to 970. -We will recheck her labs at her next visit.  3.  Chronic kidney disease: - Patient has a history of chronic kidney disease. - Labs done on 01/26/2020 showed her creatinine 1.64 -We will continue to monitor.  4.  Vitamin D deficiency: - Labs done on 04/28/2019 showed her vitamin D level 10.4. -We will call her in a prescription for vitamin D 50,000 units weekly. -Labs done on 01/26/2020 shows vitamin D level is still pending. -We will monitor her labs on her next visit.     Orders placed this encounter:  Orders Placed This Encounter  Procedures  . Lactate dehydrogenase  . CBC with Differential/Platelet  . Comprehensive metabolic panel  . Ferritin  . Iron and TIBC  . Vitamin B12  . VITAMIN D 25 Hydroxy (Vit-D Deficiency, Fractures)  . Folate      Francene Finders, FNP-C [place] [phone]

## 2020-01-26 NOTE — Progress Notes (Signed)
1145 Labs reviewed with and pt seen RLockamy NP and pt to receive her Aranesp injection today per NP                                    Margaretha Seeds tolerated Aranesp injection well without complaints or incident. Hgb 10.7 today. VSS Pt discharged via wheelchair in satisfactory condition

## 2020-01-26 NOTE — Assessment & Plan Note (Signed)
1.  Iron deficiency anemia: - Anemia felt to be secondary due to chronic kidney disease. -Patient had an EGD revealing erosive gastritis and moderate duodenitis. - Patient was being treated with Aranesp 50 mcg every 8 weeks. - Patient's last Aranesp injection was 05/05/2019 -Work-up labs done on 04/28/2019 showed her hemoglobin 9.9, ferritin 240, percent saturation 30, platelets 215, WBC 7.0.  M spike not observed.  Creatinine 1.59 -She denies any bright red bleeding per rectum or melena. -Labs done on 01/26/2020 showed hemoglobin 10.7, creatinine 1.64 -We will continue Aranesp injections every 12 weeks 50 mcg. -She will get her injection today. -Patient will follow-up in 12 weeks with labs and injection.  2.  Vitamin B12 deficiency: - Patient is on monthly B12 injections at home. -Labs on 04/28/2019 showed her B12 level greater than 7500 -We will stop home B12 injections at this time.   -Labs done on 11/03/2019 showed vitamin B12 level has dropped to 270. -Patient will start on oral B12 2.5 mcg daily. -Follow-up labs on 01/26/2020 showed her B12 has improved to 970. -We will recheck her labs at her next visit.  3.  Chronic kidney disease: - Patient has a history of chronic kidney disease. - Labs done on 01/26/2020 showed her creatinine 1.64 -We will continue to monitor.  4.  Vitamin D deficiency: - Labs done on 04/28/2019 showed her vitamin D level 10.4. -We will call her in a prescription for vitamin D 50,000 units weekly. -Labs done on 01/26/2020 shows vitamin D level is still pending. -We will monitor her labs on her next visit.

## 2020-02-22 ENCOUNTER — Other Ambulatory Visit: Payer: Self-pay | Admitting: Gastroenterology

## 2020-02-22 DIAGNOSIS — K219 Gastro-esophageal reflux disease without esophagitis: Secondary | ICD-10-CM

## 2020-04-01 DIAGNOSIS — E119 Type 2 diabetes mellitus without complications: Secondary | ICD-10-CM | POA: Diagnosis not present

## 2020-04-19 ENCOUNTER — Other Ambulatory Visit: Payer: Self-pay

## 2020-04-19 ENCOUNTER — Inpatient Hospital Stay (HOSPITAL_COMMUNITY): Payer: Medicare Other | Attending: Hematology

## 2020-04-19 ENCOUNTER — Inpatient Hospital Stay (HOSPITAL_COMMUNITY): Payer: Medicare Other

## 2020-04-19 ENCOUNTER — Inpatient Hospital Stay (HOSPITAL_BASED_OUTPATIENT_CLINIC_OR_DEPARTMENT_OTHER): Payer: Medicare Other | Admitting: Nurse Practitioner

## 2020-04-19 DIAGNOSIS — N189 Chronic kidney disease, unspecified: Secondary | ICD-10-CM | POA: Diagnosis not present

## 2020-04-19 DIAGNOSIS — N183 Chronic kidney disease, stage 3 unspecified: Secondary | ICD-10-CM

## 2020-04-19 DIAGNOSIS — D631 Anemia in chronic kidney disease: Secondary | ICD-10-CM

## 2020-04-19 DIAGNOSIS — K2980 Duodenitis without bleeding: Secondary | ICD-10-CM | POA: Insufficient documentation

## 2020-04-19 DIAGNOSIS — Z8719 Personal history of other diseases of the digestive system: Secondary | ICD-10-CM | POA: Insufficient documentation

## 2020-04-19 DIAGNOSIS — Z885 Allergy status to narcotic agent status: Secondary | ICD-10-CM | POA: Insufficient documentation

## 2020-04-19 DIAGNOSIS — Z79899 Other long term (current) drug therapy: Secondary | ICD-10-CM | POA: Diagnosis not present

## 2020-04-19 DIAGNOSIS — E538 Deficiency of other specified B group vitamins: Secondary | ICD-10-CM | POA: Diagnosis not present

## 2020-04-19 DIAGNOSIS — F329 Major depressive disorder, single episode, unspecified: Secondary | ICD-10-CM | POA: Diagnosis not present

## 2020-04-19 DIAGNOSIS — K2960 Other gastritis without bleeding: Secondary | ICD-10-CM | POA: Insufficient documentation

## 2020-04-19 DIAGNOSIS — E611 Iron deficiency: Secondary | ICD-10-CM | POA: Insufficient documentation

## 2020-04-19 DIAGNOSIS — Z9049 Acquired absence of other specified parts of digestive tract: Secondary | ICD-10-CM | POA: Insufficient documentation

## 2020-04-19 DIAGNOSIS — E559 Vitamin D deficiency, unspecified: Secondary | ICD-10-CM | POA: Diagnosis not present

## 2020-04-19 DIAGNOSIS — N1831 Chronic kidney disease, stage 3a: Secondary | ICD-10-CM

## 2020-04-19 LAB — COMPREHENSIVE METABOLIC PANEL
ALT: 8 U/L (ref 0–44)
AST: 14 U/L — ABNORMAL LOW (ref 15–41)
Albumin: 3.6 g/dL (ref 3.5–5.0)
Alkaline Phosphatase: 58 U/L (ref 38–126)
Anion gap: 6 (ref 5–15)
BUN: 17 mg/dL (ref 8–23)
CO2: 23 mmol/L (ref 22–32)
Calcium: 9 mg/dL (ref 8.9–10.3)
Chloride: 114 mmol/L — ABNORMAL HIGH (ref 98–111)
Creatinine, Ser: 1.45 mg/dL — ABNORMAL HIGH (ref 0.44–1.00)
GFR calc Af Amer: 37 mL/min — ABNORMAL LOW (ref >60)
GFR calc non Af Amer: 32 mL/min — ABNORMAL LOW (ref >60)
Glucose, Bld: 86 mg/dL (ref 70–99)
Potassium: 3.5 mmol/L (ref 3.5–5.1)
Sodium: 143 mmol/L (ref 135–145)
Total Bilirubin: 0.5 mg/dL (ref 0.3–1.2)
Total Protein: 6.8 g/dL (ref 6.5–8.1)

## 2020-04-19 LAB — CBC WITH DIFFERENTIAL/PLATELET
Abs Immature Granulocytes: 0.01 10*3/uL (ref 0.00–0.07)
Basophils Absolute: 0 10*3/uL (ref 0.0–0.1)
Basophils Relative: 1 %
Eosinophils Absolute: 0 10*3/uL (ref 0.0–0.5)
Eosinophils Relative: 1 %
HCT: 33 % — ABNORMAL LOW (ref 36.0–46.0)
Hemoglobin: 10.7 g/dL — ABNORMAL LOW (ref 12.0–15.0)
Immature Granulocytes: 0 %
Lymphocytes Relative: 49 %
Lymphs Abs: 2.6 10*3/uL (ref 0.7–4.0)
MCH: 31.7 pg (ref 26.0–34.0)
MCHC: 32.4 g/dL (ref 30.0–36.0)
MCV: 97.6 fL (ref 80.0–100.0)
Monocytes Absolute: 0.4 10*3/uL (ref 0.1–1.0)
Monocytes Relative: 8 %
Neutro Abs: 2.2 10*3/uL (ref 1.7–7.7)
Neutrophils Relative %: 41 %
Platelets: 192 10*3/uL (ref 150–400)
RBC: 3.38 MIL/uL — ABNORMAL LOW (ref 3.87–5.11)
RDW: 13.8 % (ref 11.5–15.5)
WBC: 5.3 10*3/uL (ref 4.0–10.5)
nRBC: 0 % (ref 0.0–0.2)

## 2020-04-19 LAB — FERRITIN: Ferritin: 161 ng/mL (ref 11–307)

## 2020-04-19 LAB — IRON AND TIBC
Iron: 46 ug/dL (ref 28–170)
Saturation Ratios: 21 % (ref 10.4–31.8)
TIBC: 219 ug/dL — ABNORMAL LOW (ref 250–450)
UIBC: 173 ug/dL

## 2020-04-19 LAB — FOLATE: Folate: 14.9 ng/mL (ref >5.9)

## 2020-04-19 LAB — VITAMIN D 25 HYDROXY (VIT D DEFICIENCY, FRACTURES): Vit D, 25-Hydroxy: 38.39 ng/mL (ref 30–100)

## 2020-04-19 LAB — LACTATE DEHYDROGENASE: LDH: 160 U/L (ref 98–192)

## 2020-04-19 LAB — VITAMIN B12: Vitamin B-12: 995 pg/mL — ABNORMAL HIGH (ref 180–914)

## 2020-04-19 MED ORDER — DARBEPOETIN ALFA 60 MCG/0.3ML IJ SOSY
PREFILLED_SYRINGE | INTRAMUSCULAR | Status: AC
  Filled 2020-04-19: qty 0.3

## 2020-04-19 MED ORDER — DARBEPOETIN ALFA 60 MCG/0.3ML IJ SOSY
50.0000 ug | PREFILLED_SYRINGE | Freq: Once | INTRAMUSCULAR | Status: AC
  Administered 2020-04-19: 50 ug via SUBCUTANEOUS

## 2020-04-19 NOTE — Progress Notes (Signed)
[place] reviewed with and pt seen by RLocakmy NP and pt approved for Aranesp injection today for Hgb 10.7 per NP                                  Margaretha Seeds tolerated Aranesp injection well without complaints or incident. VSS Pt discharged via wheelchair in satisfactory condition accompanied by family member

## 2020-04-19 NOTE — Assessment & Plan Note (Signed)
1.  Iron deficiency anemia: - Anemia felt to be secondary due to chronic kidney disease. -Patient had an EGD revealing erosive gastritis and moderate duodenitis. - Patient was being treated with Aranesp 50 mcg every 8 weeks. -Work-up labs done on 04/28/2019 showed her hemoglobin 9.9, ferritin 240, percent saturation 30, platelets 215, WBC 7.0.  M spike not observed.  Creatinine 1.59 -She denies any bright red bleeding per rectum or melena. -Labs done on 04/19/2020 showed hemoglobin 10.7, creatinine 1.45 -We will continue Aranesp injections every 12 weeks 50 mcg. -She will get her injection today. -Patient will follow-up in 12 weeks with labs and injection.  2.  Vitamin B12 deficiency: - Patient is on monthly B12 injections at home. -Labs on 04/28/2019 showed her B12 level greater than 7500 -We will stop home B12 injections at this time.   -Labs done on 11/03/2019 showed vitamin B12 level has dropped to 270. -Patient will start on oral B12 2.5 mcg daily. -Follow-up labs on 01/26/2020 showed her B12 has improved to 970. -We will recheck her labs at her next visit.  3.  Chronic kidney disease: - Patient has a history of chronic kidney disease. - Labs done on 04/19/2020 showed her creatinine 1.45 -We will continue to monitor.  4.  Vitamin D deficiency: - Labs done on 04/28/2019 showed her vitamin D level 10.4. -We will call her in a prescription for vitamin D 50,000 units weekly. -Labs done on 01/26/2020 shows vitamin D level was 33.45 -We will monitor her labs on her next visit.

## 2020-04-19 NOTE — Patient Instructions (Signed)
[facility] at [facility] Discharge Instructions  Received Aranesp injection today. Follow-up as scheduled   Thank you for choosing [facility] at [facility] to provide your oncology and hematology care.  To afford each patient quality time with our provider, please arrive at least 15 minutes before your scheduled appointment time.   If you have a lab appointment with the Cancer Center please come in thru the Main Entrance and check in at the main information desk.  You need to re-schedule your appointment should you arrive 10 or more minutes late.  We strive to give you quality time with our providers, and arriving late affects you and other patients whose appointments are after yours.  Also, if you no show three or more times for appointments you may be dismissed from the clinic at the providers discretion.     Again, thank you for choosing [facility].  Our hope is that these requests will decrease the amount of time that you wait before being seen by our physicians.       _____________________________________________________________  Should you have questions after your visit to [facility], please contact our office at [phone] and follow the prompts.  Our office hours are 8:00 a.m. and 4:30 p.m. Monday - Friday.  Please note that voicemails left after 4:00 p.m. may not be returned until the following business day.  We are closed weekends and major holidays.  You do have access to a nurse 24-7, just call the main number to the clinic [phone] and do not press any options, hold on the line and a nurse will answer the phone.    For prescription refill requests, have your pharmacy contact our office and allow 72 hours.    Due to Covid, you will need to wear a mask upon entering the hospital. If you do not have a mask, a mask will be given to you at the Main Entrance upon arrival. For doctor visits, patients may have  1 support person age 18 or older with them. For treatment visits, patients can not have anyone with them due to social distancing guidelines and our immunocompromised population.     

## 2020-04-19 NOTE — Progress Notes (Signed)
[place], [place] [address]   CLINIC:  Medical Oncology/Hematology  PCP:  [name], MD [address] [place] [address] [phone]   REASON FOR VISIT: Follow-up for anemia    CURRENT THERAPY: Aranesp injections every 12 weeks   INTERVAL HISTORY:  [name] 85 y.o. female returns for routine follow-up for anemia.  Patient reports she is doing well since her last visit.  She reports her energy levels are the same.  She denies any bright red bleeding per rectum or melena.  She denies any easy bruising or bleeding. Denies any nausea, vomiting, or diarrhea. Denies any new pains. Had not noticed any recent bleeding such as epistaxis, hematuria or hematochezia. Denies recent chest pain on exertion, shortness of breath on minimal exertion, pre-syncopal episodes, or palpitations. Denies any numbness or tingling in hands or feet. Denies any recent fevers, infections, or recent hospitalizations. Patient reports appetite at 50% and energy level at 100%.     REVIEW OF SYSTEMS:  Review of Systems  Psychiatric/Behavioral: Positive for depression.  All other systems reviewed and are negative.    PAST MEDICAL/SURGICAL HISTORY:  Past Medical History:  Diagnosis Date  . Anemia in chronic kidney disease 01/20/2016  . B12 deficiency anemia 10/25/2015  . Chronic renal disease, stage 3, moderately decreased glomerular filtration rate (GFR) between 30-59 mL/min/1.73 square meter 01/07/2016  . Depression   . Essential hypertension   . GI bleed    Severe gastritis and duodenal ulcers by EGD May 2016   . HLD (hyperlipidemia)   . PSVT (paroxysmal supraventricular tachycardia) ([place] [facility])   . Septic arthritis (HCC)    MRSA, left knee   . Type 2 diabetes mellitus ([place])    Past Surgical History:  Procedure Laterality Date  . CHOLECYSTECTOMY N/A 11/04/2012   Procedure: LAPAROSCOPIC CHOLECYSTECTOMY;  Surgeon: Jamesetta So, MD;  Location: [facility];   Service: General;  Laterality: N/A;  . ESOPHAGOGASTRODUODENOSCOPY N/A 01/06/2015   SLF: mild esophagitis  . I & D EXTREMITY Left 12/24/2014   Procedure: IRRIGATION AND DEBRIDEMENT EXTREMITY AND ARTHROSCOPY KNEE;  Surgeon: Renette Butters, MD;  Location: [facility];  Service: Orthopedics;  Laterality: Left;  . KNEE SURGERY    . SAVORY DILATION  01/06/2015   Procedure: SAVORY DILATION;  Surgeon: Danie Binder, MD;  [facility];  Service: Endoscopy;;     SOCIAL HISTORY:  Social History   Socioeconomic History  . Marital status: Widowed    Spouse name: Not on file  . Number of children: 16  . Years of education: Not on file  . Highest education level: Not on file  Occupational History  . Occupation: retired; Education officer, museum  Tobacco Use  . Smoking status: Never Smoker  . Smokeless tobacco: Never Used  . Tobacco comment: Never smoked  Substance and Sexual Activity  . Alcohol use: No    Alcohol/week: 0.0 standard drinks  . Drug use: No  . Sexual activity: Not on file  Other Topics Concern  . Not on file  Social History Narrative   Lives alone   3 daughters & granddaughter nearby   Lost 1 son-strep         Social Determinants of Health   Financial Resource Strain:   . Difficulty of Paying Living Expenses:   Food Insecurity:   . Worried About Charity fundraiser in the Last Year:   . [place] in the Last Year:  Transportation Needs:   . Film/video editor (Medical):   Marland Kitchen Lack of Transportation (Non-Medical):   Physical Activity:   . Days of Exercise per Week:   . Minutes of Exercise per Session:   Stress:   . Feeling of Stress :   Social Connections:   . Frequency of Communication with Friends and Family:   . Frequency of Social Gatherings with Friends and Family:   . Attends Religious Services:   . Active Member of Clubs or Organizations:   . Attends Archivist Meetings:   Marland Kitchen Marital Status:   Intimate Partner Violence:   . Fear of Current  or Ex-Partner:   . Emotionally Abused:   Marland Kitchen Physically Abused:   . Sexually Abused:     FAMILY HISTORY:  Family History  Family history unknown: Yes    CURRENT MEDICATIONS:  Outpatient Encounter Medications as of 04/19/2020  Medication Sig  . aspirin EC 81 MG EC tablet Take 1 tablet (81 mg total) by mouth daily.  Marland Kitchen CARTIA XT 240 MG 24 hr capsule Take 240 mg by mouth daily.   Marland Kitchen donepezil (ARICEPT) 10 MG tablet Take 10 mg by mouth at bedtime.   Marland Kitchen losartan (COZAAR) 100 MG tablet Take 100 mg by mouth daily.   . pantoprazole (PROTONIX) 40 MG tablet TAKE 1 TABLET(40 MG) BY MOUTH DAILY BEFORE BREAKFAST  . vitamin B-12 (CYANOCOBALAMIN) 250 MCG tablet Take 250 mcg by mouth daily.   No facility-administered encounter medications on file as of 04/19/2020.    ALLERGIES:  Allergies  Allergen Reactions  . Oxycodone Hives     PHYSICAL EXAM:  ECOG Performance status: 1  Vitals:   04/19/20 1408  BP: (!) 153/52  Pulse: 66  Resp: 18  Temp: (!) 96.9 F (36.1 C)  SpO2: 100%   Filed Weights   04/19/20 1408  Weight: 116 lb 10 oz (52.9 kg)   Physical Exam Constitutional:      Appearance: Normal appearance. She is normal weight.  Cardiovascular:     Rate and Rhythm: Normal rate and regular rhythm.     Heart sounds: Normal heart sounds.  Pulmonary:     Effort: Pulmonary effort is normal.     Breath sounds: Normal breath sounds.  Abdominal:     General: Bowel sounds are normal.     Palpations: Abdomen is soft.  Musculoskeletal:        General: Normal range of motion.  Skin:    General: Skin is warm.  Neurological:     Mental Status: She is alert and oriented to person, place, and time. Mental status is at baseline.  Psychiatric:        Mood and Affect: Mood normal.        Behavior: Behavior normal.        Thought Content: Thought content normal.        Judgment: Judgment normal.      LABORATORY DATA:  I have reviewed the labs as listed.  CBC    Component Value  Date/Time   WBC 5.3 04/19/2020 1326   RBC 3.38 (L) 04/19/2020 1326   HGB 10.7 (L) 04/19/2020 1326   HCT 33.0 (L) 04/19/2020 1326   PLT 192 04/19/2020 1326   MCV 97.6 04/19/2020 1326   MCH 31.7 04/19/2020 1326   MCHC 32.4 04/19/2020 1326   RDW 13.8 04/19/2020 1326   LYMPHSABS 2.6 04/19/2020 1326   MONOABS 0.4 04/19/2020 1326   EOSABS 0.0 04/19/2020 1326   BASOSABS 0.0  04/19/2020 1326   CMP Latest Ref Rng & Units 04/19/2020 01/26/2020 11/03/2019  Glucose 70 - 99 mg/dL 86 103(H) 147(H)  BUN 8 - 23 mg/dL 17 21 19   Creatinine 0.44 - 1.00 mg/dL 1.45(H) 1.64(H) 1.38(H)  Sodium 135 - 145 mmol/L 143 142 143  Potassium 3.5 - 5.1 mmol/L 3.5 3.9 3.8  Chloride 98 - 111 mmol/L 114(H) 109 111  CO2 22 - 32 mmol/L 23 24 24   Calcium 8.9 - 10.3 mg/dL 9.0 9.1 9.0  Total Protein 6.5 - 8.1 g/dL 6.8 7.2 7.3  Total Bilirubin 0.3 - 1.2 mg/dL 0.5 1.0 0.7  Alkaline Phos 38 - 126 U/L 58 53 51  AST 15 - 41 U/L 14(L) 15 15  ALT 0 - 44 U/L 8 10 9     All questions were answered to patient's stated satisfaction. Encouraged patient to call with any new concerns or questions before his next visit to the cancer center and we can certain see him sooner, if needed.     ASSESSMENT & PLAN:  Anemia in chronic kidney disease 1.  Iron deficiency anemia: - Anemia felt to be secondary due to chronic kidney disease. -Patient had an EGD revealing erosive gastritis and moderate duodenitis. - Patient was being treated with Aranesp 50 mcg every 8 weeks. -Work-up labs done on 04/28/2019 showed her hemoglobin 9.9, ferritin 240, percent saturation 30, platelets 215, WBC 7.0.  M spike not observed.  Creatinine 1.59 -She denies any bright red bleeding per rectum or melena. -Labs done on 04/19/2020 showed hemoglobin 10.7, creatinine 1.45 -We will continue Aranesp injections every 12 weeks 50 mcg. -She will get her injection today. -Patient will follow-up in 12 weeks with labs and injection.  2.  Vitamin B12 deficiency: - Patient  is on monthly B12 injections at home. -Labs on 04/28/2019 showed her B12 level greater than 7500 -We will stop home B12 injections at this time.   -Labs done on 11/03/2019 showed vitamin B12 level has dropped to 270. -Patient will start on oral B12 2.5 mcg daily. -Follow-up labs on 01/26/2020 showed her B12 has improved to 970. -We will recheck her labs at her next visit.  3.  Chronic kidney disease: - Patient has a history of chronic kidney disease. - Labs done on 04/19/2020 showed her creatinine 1.45 -We will continue to monitor.  4.  Vitamin D deficiency: - Labs done on 04/28/2019 showed her vitamin D level 10.4. -We will call her in a prescription for vitamin D 50,000 units weekly. -Labs done on 01/26/2020 shows vitamin D level was 33.45 -We will monitor her labs on her next visit.     Orders placed this encounter:  Orders Placed This Encounter  Procedures  . CBC with Differential/Platelet  . Comprehensive metabolic panel  . Ferritin  . Iron and TIBC  . Vitamin B12  . VITAMIN D 25 Hydroxy (Vit-D Deficiency, Fractures)  . Folate      Francene Finders, FNP-C [place] [phone]

## 2020-07-01 DIAGNOSIS — E119 Type 2 diabetes mellitus without complications: Secondary | ICD-10-CM | POA: Diagnosis not present

## 2020-07-08 DIAGNOSIS — R55 Syncope and collapse: Secondary | ICD-10-CM | POA: Diagnosis not present

## 2020-07-08 DIAGNOSIS — R4020 Unspecified coma: Secondary | ICD-10-CM | POA: Diagnosis not present

## 2020-07-08 DIAGNOSIS — R1111 Vomiting without nausea: Secondary | ICD-10-CM | POA: Diagnosis not present

## 2020-07-12 ENCOUNTER — Ambulatory Visit (HOSPITAL_COMMUNITY): Payer: Medicare Other | Admitting: Nurse Practitioner

## 2020-07-12 ENCOUNTER — Ambulatory Visit (HOSPITAL_COMMUNITY): Payer: Medicare Other

## 2020-07-12 ENCOUNTER — Other Ambulatory Visit (HOSPITAL_COMMUNITY): Payer: Medicare Other

## 2020-07-13 ENCOUNTER — Inpatient Hospital Stay (HOSPITAL_COMMUNITY): Payer: Medicare Other | Attending: Hematology

## 2020-07-13 ENCOUNTER — Inpatient Hospital Stay (HOSPITAL_BASED_OUTPATIENT_CLINIC_OR_DEPARTMENT_OTHER): Payer: Medicare Other | Admitting: Oncology

## 2020-07-13 ENCOUNTER — Inpatient Hospital Stay (HOSPITAL_COMMUNITY): Payer: Medicare Other

## 2020-07-13 ENCOUNTER — Other Ambulatory Visit: Payer: Self-pay

## 2020-07-13 VITALS — BP 157/70 | HR 78 | Temp 97.0°F | Resp 16

## 2020-07-13 DIAGNOSIS — D509 Iron deficiency anemia, unspecified: Secondary | ICD-10-CM | POA: Insufficient documentation

## 2020-07-13 DIAGNOSIS — E538 Deficiency of other specified B group vitamins: Secondary | ICD-10-CM | POA: Insufficient documentation

## 2020-07-13 DIAGNOSIS — N183 Chronic kidney disease, stage 3 unspecified: Secondary | ICD-10-CM | POA: Insufficient documentation

## 2020-07-13 DIAGNOSIS — Z23 Encounter for immunization: Secondary | ICD-10-CM

## 2020-07-13 DIAGNOSIS — Z79899 Other long term (current) drug therapy: Secondary | ICD-10-CM | POA: Insufficient documentation

## 2020-07-13 DIAGNOSIS — I10 Essential (primary) hypertension: Secondary | ICD-10-CM | POA: Diagnosis not present

## 2020-07-13 DIAGNOSIS — R55 Syncope and collapse: Secondary | ICD-10-CM | POA: Diagnosis not present

## 2020-07-13 DIAGNOSIS — E559 Vitamin D deficiency, unspecified: Secondary | ICD-10-CM | POA: Insufficient documentation

## 2020-07-13 DIAGNOSIS — Z7982 Long term (current) use of aspirin: Secondary | ICD-10-CM | POA: Diagnosis not present

## 2020-07-13 DIAGNOSIS — D631 Anemia in chronic kidney disease: Secondary | ICD-10-CM | POA: Diagnosis not present

## 2020-07-13 DIAGNOSIS — N189 Chronic kidney disease, unspecified: Secondary | ICD-10-CM

## 2020-07-13 DIAGNOSIS — E785 Hyperlipidemia, unspecified: Secondary | ICD-10-CM | POA: Diagnosis not present

## 2020-07-13 LAB — CBC WITH DIFFERENTIAL/PLATELET
Abs Immature Granulocytes: 0.01 10*3/uL (ref 0.00–0.07)
Basophils Absolute: 0 10*3/uL (ref 0.0–0.1)
Basophils Relative: 0 %
Eosinophils Absolute: 0.1 10*3/uL (ref 0.0–0.5)
Eosinophils Relative: 1 %
HCT: 34.2 % — ABNORMAL LOW (ref 36.0–46.0)
Hemoglobin: 11.1 g/dL — ABNORMAL LOW (ref 12.0–15.0)
Immature Granulocytes: 0 %
Lymphocytes Relative: 41 %
Lymphs Abs: 2.5 10*3/uL (ref 0.7–4.0)
MCH: 32.3 pg (ref 26.0–34.0)
MCHC: 32.5 g/dL (ref 30.0–36.0)
MCV: 99.4 fL (ref 80.0–100.0)
Monocytes Absolute: 0.4 10*3/uL (ref 0.1–1.0)
Monocytes Relative: 7 %
Neutro Abs: 3.2 10*3/uL (ref 1.7–7.7)
Neutrophils Relative %: 51 %
Platelets: 198 10*3/uL (ref 150–400)
RBC: 3.44 MIL/uL — ABNORMAL LOW (ref 3.87–5.11)
RDW: 14.1 % (ref 11.5–15.5)
WBC: 6.2 10*3/uL (ref 4.0–10.5)
nRBC: 0 % (ref 0.0–0.2)

## 2020-07-13 LAB — COMPREHENSIVE METABOLIC PANEL
ALT: 10 U/L (ref 0–44)
AST: 14 U/L — ABNORMAL LOW (ref 15–41)
Albumin: 3.8 g/dL (ref 3.5–5.0)
Alkaline Phosphatase: 61 U/L (ref 38–126)
Anion gap: 10 (ref 5–15)
BUN: 32 mg/dL — ABNORMAL HIGH (ref 8–23)
CO2: 26 mmol/L (ref 22–32)
Calcium: 9.5 mg/dL (ref 8.9–10.3)
Chloride: 108 mmol/L (ref 98–111)
Creatinine, Ser: 1.71 mg/dL — ABNORMAL HIGH (ref 0.44–1.00)
GFR, Estimated: 29 mL/min — ABNORMAL LOW (ref >60)
Glucose, Bld: 92 mg/dL (ref 70–99)
Potassium: 4.6 mmol/L (ref 3.5–5.1)
Sodium: 144 mmol/L (ref 135–145)
Total Bilirubin: 0.9 mg/dL (ref 0.3–1.2)
Total Protein: 7.5 g/dL (ref 6.5–8.1)

## 2020-07-13 LAB — FERRITIN: Ferritin: 189 ng/mL (ref 11–307)

## 2020-07-13 LAB — FOLATE: Folate: 15.1 ng/mL (ref >5.9)

## 2020-07-13 LAB — IRON AND TIBC
Iron: 61 ug/dL (ref 28–170)
Saturation Ratios: 22 % (ref 10.4–31.8)
TIBC: 282 ug/dL (ref 250–450)
UIBC: 221 ug/dL

## 2020-07-13 LAB — VITAMIN B12: Vitamin B-12: 791 pg/mL (ref 180–914)

## 2020-07-13 LAB — VITAMIN D 25 HYDROXY (VIT D DEFICIENCY, FRACTURES): Vit D, 25-Hydroxy: 31.71 ng/mL (ref 30–100)

## 2020-07-13 MED ORDER — INFLUENZA VAC A&B SA ADJ QUAD 0.5 ML IM PRSY: 0.5000 mL | PREFILLED_SYRINGE | Freq: Once | INTRAMUSCULAR | Status: DC

## 2020-07-13 NOTE — Progress Notes (Signed)
[place], [place] [address]   CLINIC:  Medical Oncology/Hematology  PCP:  [name], MD [address] [place] [address] [phone]   REASON FOR VISIT: Follow-up for anemia    CURRENT THERAPY: Aranesp injections every 12 weeks  INTERVAL HISTORY:  Laura Davenport 86 y.o. female returns for routine follow-up for anemia.  Mrs. Mirkin reports doing well since her last visit.  She is here with her daughter today.  She denies any bleeding, fevers or night sweats.  Daughter mentions an syncopal episode that happened last weekend after eating a fairly heavy meal.  Reports she was sitting in her wheelchair and became unresponsive for a few seconds.  When she woke up she immediately vomited and was very diaphoretic.  Her daughter did call EMS who checked her out and did not take her to the hospital.  All of her vital signs were ok.  Since then they have not had any additional episodes.  She denies any bright red bleeding per rectum or melena.  She denies any easy bruising or bleeding. Denies any nausea or diarrhea. Denies any new pains. Had not noticed any recent bleeding such as epistaxis, hematuria or hematochezia. Denies recent chest pain on exertion, shortness of breath on minimal exertion, pre-syncopal episodes, or palpitations. Denies any numbness or tingling in hands or feet. Denies any recent fevers, infections, or recent hospitalizations. Patient reports appetite at 50% and energy level at 100%.  REVIEW OF SYSTEMS:  Review of Systems  Constitutional: Positive for fatigue. Negative for appetite change, fever and unexpected weight change.  HENT:   Negative for nosebleeds, sore throat and trouble swallowing.   Eyes: Negative.   Respiratory: Negative.  Negative for cough, shortness of breath and wheezing.   Cardiovascular: Negative.  Negative for chest pain and leg swelling.  Gastrointestinal: Positive for vomiting (1 episode). Negative for  abdominal pain, blood in stool, constipation, diarrhea and nausea.  Endocrine: Negative.   Genitourinary: Negative.  Negative for bladder incontinence, hematuria and nocturia.   Musculoskeletal: Positive for gait problem (Chronic). Negative for back pain and flank pain.  Skin: Negative.   Neurological: Positive for gait problem (Chronic). Negative for dizziness, headaches, light-headedness and numbness.       Syncopal episode  Hematological: Negative.   Psychiatric/Behavioral: Negative.  Negative for confusion. The patient is not nervous/anxious.      PAST MEDICAL/SURGICAL HISTORY:  Past Medical History:  Diagnosis Date  . Anemia in chronic kidney disease 01/20/2016  . B12 deficiency anemia 10/25/2015  . Chronic renal disease, stage 3, moderately decreased glomerular filtration rate (GFR) between 30-59 mL/min/1.73 square meter 01/07/2016  . Depression   . Essential hypertension   . GI bleed    Severe gastritis and duodenal ulcers by EGD May 2016   . HLD (hyperlipidemia)   . PSVT (paroxysmal supraventricular tachycardia) ([place])   . Septic arthritis (HCC)    MRSA, left knee   . Type 2 diabetes mellitus ([place])    Past Surgical History:  Procedure Laterality Date  . CHOLECYSTECTOMY N/A 11/04/2012   Procedure: LAPAROSCOPIC CHOLECYSTECTOMY;  Surgeon: Jamesetta So, MD;  Location: [facility];  Service: General;  Laterality: N/A;  . ESOPHAGOGASTRODUODENOSCOPY N/A 01/06/2015   SLF: mild esophagitis  . I & D EXTREMITY Left 12/24/2014   Procedure: IRRIGATION AND DEBRIDEMENT EXTREMITY AND ARTHROSCOPY KNEE;  Surgeon: Renette Butters, MD;  Location: [facility];  Service: Orthopedics;  Laterality: Left;  . KNEE SURGERY    .  SAVORY DILATION  01/06/2015   Procedure: SAVORY DILATION;  Surgeon: Danie Binder, MD;  [facility];  Service: Endoscopy;;     SOCIAL HISTORY:  Social History   Socioeconomic History  . Marital status: Widowed    Spouse name: Not on file  . Number of children: 79  .  Years of education: Not on file  . Highest education level: Not on file  Occupational History  . Occupation: retired; Education officer, museum  Tobacco Use  . Smoking status: Never Smoker  . Smokeless tobacco: Never Used  . Tobacco comment: Never smoked  Substance and Sexual Activity  . Alcohol use: No    Alcohol/week: 0.0 standard drinks  . Drug use: No  . Sexual activity: Not on file  Other Topics Concern  . Not on file  Social History Narrative   Lives alone   3 daughters & granddaughter nearby   Lost 1 son-strep         Social Determinants of Health   Financial Resource Strain: Low Risk   . Difficulty of Paying Living Expenses: Not hard at all  Food Insecurity:   . Worried About Charity fundraiser in the Last Year: Not on file  . Ran Out of Food in the Last Year: Not on file  Transportation Needs: No Transportation Needs  . Lack of Transportation (Medical): No  . Lack of Transportation (Non-Medical): No  Physical Activity:   . Days of Exercise per Week: Not on file  . Minutes of Exercise per Session: Not on file  Stress:   . Feeling of Stress : Not on file  Social Connections: Moderately Isolated  . Frequency of Communication with Friends and Family: Three times a week  . Frequency of Social Gatherings with Friends and Family: More than three times a week  . Attends Religious Services: More than 4 times per year  . Active Member of Clubs or Organizations: No  . Attends Archivist Meetings: Never  . Marital Status: Widowed  Intimate Partner Violence: Not At Risk  . Fear of Current or Ex-Partner: No  . Emotionally Abused: No  . Physically Abused: No  . Sexually Abused: No    FAMILY HISTORY:  Family History  Family history unknown: Yes    CURRENT MEDICATIONS:  Outpatient Encounter Medications as of 07/13/2020  Medication Sig  . aspirin EC 81 MG EC tablet Take 1 tablet (81 mg total) by mouth daily.  Marland Kitchen CARTIA XT 240 MG 24 hr capsule Take 240 mg by mouth  daily.   Marland Kitchen donepezil (ARICEPT) 10 MG tablet Take 10 mg by mouth at bedtime.   Marland Kitchen losartan (COZAAR) 100 MG tablet Take 100 mg by mouth daily.   . pantoprazole (PROTONIX) 40 MG tablet TAKE 1 TABLET(40 MG) BY MOUTH DAILY BEFORE BREAKFAST  . vitamin B-12 (CYANOCOBALAMIN) 250 MCG tablet Take 250 mcg by mouth daily.   Facility-Administered Encounter Medications as of 07/13/2020  Medication  . influenza vaccine adjuvanted (FLUAD) injection 0.5 mL    ALLERGIES:  Allergies  Allergen Reactions  . Oxycodone Hives     PHYSICAL EXAM:  ECOG Performance status: 1  Vitals:   07/13/20 0954  BP: (!) 157/70  Pulse: 78  Resp: 16  Temp: (!) 97 F (36.1 C)  SpO2: 100%   There were no vitals filed for this visit. Physical Exam Constitutional:      Appearance: Normal appearance. She is normal weight.  Cardiovascular:     Rate and Rhythm:  Normal rate and regular rhythm.     Heart sounds: Normal heart sounds.  Pulmonary:     Effort: Pulmonary effort is normal.     Breath sounds: Normal breath sounds.  Abdominal:     General: Bowel sounds are normal.     Palpations: Abdomen is soft.  Musculoskeletal:        General: Normal range of motion.  Skin:    General: Skin is warm.  Neurological:     Mental Status: She is alert and oriented to person, place, and time. Mental status is at baseline.  Psychiatric:        Mood and Affect: Mood normal.        Behavior: Behavior normal.        Thought Content: Thought content normal.        Judgment: Judgment normal.      LABORATORY DATA:  I have reviewed the labs as listed.  CBC    Component Value Date/Time   WBC 6.2 07/13/2020 0853   RBC 3.44 (L) 07/13/2020 0853   HGB 11.1 (L) 07/13/2020 0853   HCT 34.2 (L) 07/13/2020 0853   PLT 198 07/13/2020 0853   MCV 99.4 07/13/2020 0853   MCH 32.3 07/13/2020 0853   MCHC 32.5 07/13/2020 0853   RDW 14.1 07/13/2020 0853   LYMPHSABS 2.5 07/13/2020 0853   MONOABS 0.4 07/13/2020 0853   EOSABS 0.1  07/13/2020 0853   BASOSABS 0.0 07/13/2020 0853   CMP Latest Ref Rng & Units 07/13/2020 04/19/2020 01/26/2020  Glucose 70 - 99 mg/dL 92 86 103(H)  BUN 8 - 23 mg/dL 32(H) 17 21  Creatinine 0.44 - 1.00 mg/dL 1.71(H) 1.45(H) 1.64(H)  Sodium 135 - 145 mmol/L 144 143 142  Potassium 3.5 - 5.1 mmol/L 4.6 3.5 3.9  Chloride 98 - 111 mmol/L 108 114(H) 109  CO2 22 - 32 mmol/L 26 23 24   Calcium 8.9 - 10.3 mg/dL 9.5 9.0 9.1  Total Protein 6.5 - 8.1 g/dL 7.5 6.8 7.2  Total Bilirubin 0.3 - 1.2 mg/dL 0.9 0.5 1.0  Alkaline Phos 38 - 126 U/L 61 58 53  AST 15 - 41 U/L 14(L) 14(L) 15  ALT 0 - 44 U/L 10 8 10     All questions were answered to patient's stated satisfaction. Encouraged patient to call with any new concerns or questions before his next visit to the cancer center and we can certain see him sooner, if needed.     ASSESSMENT & PLAN:  1.  Iron deficiency anemia: - Anemia felt to be secondary due to chronic kidney disease. -Patient had an EGD revealing erosive gastritis and moderate duodenitis. - Patient was being treated with Aranesp 50 mcg every 8 weeks. -Initial work-up labs done on 04/28/2019 showed her hemoglobin 9.9, ferritin 240, percent saturation 30, platelets 215, WBC 7.0.  M spike not observed.  Creatinine 1.59 -She denies any bright red bleeding per rectum or melena. -Aranesp injection on 04/19/2020 for hemoglobin of 10.7. -Labs done on 07/13/2020 showed hemoglobin 11.1, creatinine 1.71.  -She will not need her injection today given her hemoglobin is greater than 11. -We will continue Aranesp injections every 12 weeks 50 mcg.  2.  Vitamin B12 deficiency: -She was previously receiving B12 injections at home. -Recently stopped secondary to elevated B12 level 7500. -She was started on oral B12. -Labs today show a B12 level of 791 -We will recheck her labs at her next visit.  3.  Chronic kidney disease: - Patient has a history  of chronic kidney disease. - Labs done on 07/13/2020  showed her creatinine has slightly increased to 1.71.  Encouraged hydration. -We will continue to monitor.  4.  Vitamin D deficiency: -She is currently taking vitamin D 50,000 units weekly. -Vitamin D level pending at time of dictation. -We will call patient with results -We will recheck at next visit.  5. Syncopal episode: -Unclear etiology but sounds cardiac in nature. -Will touch base with Dr. Willey Blade her PCP to see if he would like a referral to cardiology.  Disposition: Return to clinic in 3 months for labs (CBC, CMP, ferritin, vitamin D level, vitamin B12 level and folate) Referral to cardiology  No problem-specific Assessment & Plan notes found for this encounter.   Orders placed this encounter:  No orders of the defined types were placed in this encounter. Faythe Casa, NP 07/13/2020 10:53 AM  [place] [phone]

## 2020-07-13 NOTE — Progress Notes (Signed)
Aranesp held today for hgb 11.7

## 2020-08-16 DIAGNOSIS — R1084 Generalized abdominal pain: Secondary | ICD-10-CM | POA: Diagnosis not present

## 2020-08-16 DIAGNOSIS — I4891 Unspecified atrial fibrillation: Secondary | ICD-10-CM | POA: Diagnosis not present

## 2020-08-16 DIAGNOSIS — R52 Pain, unspecified: Secondary | ICD-10-CM | POA: Diagnosis not present

## 2020-09-27 ENCOUNTER — Other Ambulatory Visit: Payer: Self-pay | Admitting: Cardiology

## 2020-09-27 ENCOUNTER — Other Ambulatory Visit: Payer: Self-pay

## 2020-09-27 ENCOUNTER — Ambulatory Visit (INDEPENDENT_AMBULATORY_CARE_PROVIDER_SITE_OTHER): Payer: Medicare Other

## 2020-09-27 ENCOUNTER — Encounter: Payer: Self-pay | Admitting: Cardiology

## 2020-09-27 ENCOUNTER — Ambulatory Visit (INDEPENDENT_AMBULATORY_CARE_PROVIDER_SITE_OTHER): Payer: Medicare Other | Admitting: Cardiology

## 2020-09-27 VITALS — BP 138/64 | HR 79 | Wt 119.0 lb

## 2020-09-27 DIAGNOSIS — I4891 Unspecified atrial fibrillation: Secondary | ICD-10-CM | POA: Diagnosis not present

## 2020-09-27 DIAGNOSIS — R55 Syncope and collapse: Secondary | ICD-10-CM | POA: Diagnosis not present

## 2020-09-27 DIAGNOSIS — Z87898 Personal history of other specified conditions: Secondary | ICD-10-CM

## 2020-09-27 DIAGNOSIS — I4892 Unspecified atrial flutter: Secondary | ICD-10-CM

## 2020-09-27 DIAGNOSIS — I471 Supraventricular tachycardia: Secondary | ICD-10-CM

## 2020-09-27 NOTE — Patient Instructions (Signed)
Medication Instructions:  Your physician recommends that you continue on your current medications as directed. Please refer to the Current Medication list given to you today.  *If you need a refill on your cardiac medications before your next appointment, please call your pharmacy*   Lab Work: None Today If you have labs (blood work) drawn today and your tests are completely normal, you will receive your results only by: Marland Kitchen MyChart Message (if you have MyChart) OR . A paper copy in the mail If you have any lab test that is abnormal or we need to change your treatment, we will call you to review the results.   Testing/Procedures  ZIO XT  . Call [employer] with any questions during wear 24/7: [phone]  . Zio patch should be worn for 14 days unless otherwise instructed  . Do not shower for 24 hours after Zio is applied (sponge bath is fine, just keep the patch dry)  . After that, when showering avoid direct shower stream of water onto Zio patch, pat dry around the patch  . Avoid excessive sweating  . Push the button when you are feeling any cardiac symptoms and record them in the logbook or on the "My Zio" App  . Refer to the removal date listed on the front of the symptom logbook and remove Zio patch according to the instructions in the back of the logbook  . Place Zio patch and symptom logbook inside the provided pre-labeled box, tape the box, & place in outgoing USPS mailbox    Follow-Up: At [facility], you and your health needs are our priority.  As part of our continuing mission to provide you with exceptional heart care, we have created designated Provider Care Teams.  These Care Teams include your primary Cardiologist (physician) and Advanced Practice Providers (APPs -  Physician Assistants and Nurse Practitioners) who all work together to provide you with the care you need, when you need it.  We recommend signing up for the patient portal called "MyChart".  Sign up  information is provided on this After Visit Summary.  MyChart is used to connect with patients for Virtual Visits (Telemedicine).  Patients are able to view lab/test results, encounter notes, upcoming appointments, etc.  Non-urgent messages can be sent to your provider as well.   To learn more about what you can do with MyChart, go to [email or web address].    Your next appointment:   6 month(s)  The format for your next appointment:   In Person  Provider:   Rozann Lesches, MD   Other Instructions Zio Monitor 7 day

## 2020-09-27 NOTE — Progress Notes (Signed)
Cardiology Office Note  Date: 09/27/2020   ID: Eithel, Mikulich [birth year], MRN [id number]  PCP:  [name], MD  Cardiologist:  Rozann Lesches, MD Electrophysiologist:  None   Chief Complaint  Patient presents with  . History of syncope    History of Present Illness: Laura Davenport is an 87 y.o. female referred for cardiology consultation by Ms. Burns NP in the Oncology clinic after a reported episode of syncope that occurred back in November 2021.  She is here today with her daughter.  Patient had apparently had a large meal including fish, was sitting in a wheelchair watching a movie, her daughter noticed that she had slumped over and was unresponsive.  When she came around she had an episode of emesis, EMS was summoned and checked on her, but found no acute issues and she was not transported to the hospital.  She has since not had any recurrent spells.  Sometimes feels lightheaded.  Baseline functional status is fairly low.  She does not report any sense of palpitations or chest pain.  She has been having recurring lower abdominal discomfort.  I last saw Laura Davenport in the office back in 2017 for follow-up of known PSVT.  She has continued on Cartia XT with no obvious palpitations.  I personally reviewed her ECG today which shows sinus rhythm with low voltage.  Past Medical History:  Diagnosis Date  . Anemia in chronic kidney disease   . B12 deficiency anemia   . Chronic renal disease, stage 3, moderately decreased glomerular filtration rate (GFR) between 30-59 mL/min/1.73 square meter (HCC)   . Dementia ([place])   . Depression   . Essential hypertension   . GI bleed    Severe gastritis and duodenal ulcers by EGD May 2016   . Hyperlipidemia   . PSVT (paroxysmal supraventricular tachycardia) ([place])   . Septic arthritis (HCC)    MRSA, left knee   . Type 2 diabetes mellitus ([place])     Past Surgical History:  Procedure Laterality Date  . CHOLECYSTECTOMY N/A 11/04/2012    Procedure: LAPAROSCOPIC CHOLECYSTECTOMY;  Surgeon: Jamesetta So, MD;  Location: [facility];  Service: General;  Laterality: N/A;  . ESOPHAGOGASTRODUODENOSCOPY N/A 01/06/2015   SLF: mild esophagitis  . I & D EXTREMITY Left 12/24/2014   Procedure: IRRIGATION AND DEBRIDEMENT EXTREMITY AND ARTHROSCOPY KNEE;  Surgeon: Renette Butters, MD;  Location: [facility];  Service: Orthopedics;  Laterality: Left;  . KNEE SURGERY    . SAVORY DILATION  01/06/2015   Procedure: SAVORY DILATION;  Surgeon: Danie Binder, MD;  [facility];  Service: Endoscopy;;    Current Outpatient Medications  Medication Sig Dispense Refill  . aspirin EC 81 MG EC tablet Take 1 tablet (81 mg total) by mouth daily.    Marland Kitchen CARTIA XT 240 MG 24 hr capsule Take 240 mg by mouth daily.     Marland Kitchen donepezil (ARICEPT) 10 MG tablet Take 10 mg by mouth at bedtime.     Marland Kitchen losartan (COZAAR) 100 MG tablet Take 100 mg by mouth daily.     . pantoprazole (PROTONIX) 40 MG tablet TAKE 1 TABLET(40 MG) BY MOUTH DAILY BEFORE BREAKFAST 90 tablet 3  . vitamin B-12 (CYANOCOBALAMIN) 250 MCG tablet Take 250 mcg by mouth daily.     No current facility-administered medications for this visit.   Allergies:  Oxycodone   Social History: The patient  reports that she has never smoked. She has never used smokeless  tobacco. She reports that she does not drink alcohol and does not use drugs.   Family History: The patient's Family history is unknown by patient.   ROS: Memory loss.  Physical Exam: VS:  BP 138/64   Pulse 79   Wt 119 lb (54 kg)   SpO2 96%   BMI 21.08 kg/m , BMI Body mass index is 21.08 kg/m.  Wt Readings from Last 3 Encounters:  09/27/20 119 lb (54 kg)  04/19/20 116 lb 10 oz (52.9 kg)  01/26/20 121 lb 1.6 oz (54.9 kg)    General: Frail-appearing elderly woman seated in wheelchair, no distress. HEENT: Conjunctiva and lids normal, wearing a mask. Neck: Supple, no elevated JVP or carotid bruits, no thyromegaly. Lungs: Clear to auscultation,  nonlabored breathing at rest. Cardiac: Regular rate and rhythm, no S3, soft systolic murmur. Abdomen: Soft, nontender, bowel sounds present. Extremities: No pitting edema, distal pulses 2+. Skin: Warm and dry. Musculoskeletal: Kyphosis noted.  ECG:  An ECG dated 02/09/2019 was personally reviewed today and demonstrated:  Sinus rhythm.  Recent Labwork: 07/13/2020: ALT 10; AST 14; BUN 32; Creatinine, Ser 1.71; Hemoglobin 11.1; Platelets 198; Potassium 4.6; Sodium 144     Component Value Date/Time   CHOL 195 02/02/2019 0428   TRIG 73 02/02/2019 0428   HDL 57 02/02/2019 0428   CHOLHDL 3.4 02/02/2019 0428   VLDL 15 02/02/2019 0428   LDLCALC 123 (H) 02/02/2019 0428    Other Studies Reviewed Today:  Echocardiogram 12/23/2014: - Left ventricle: The cavity size was normal. Wall thickness was  increased in a pattern of moderate LVH. Systolic function was  normal. The estimated ejection fraction was in the range of 55%  to 60%. Wall motion was normal; there were no regional wall  motion abnormalities. Doppler parameters are consistent with  abnormal left ventricular relaxation (grade 1 diastolic  dysfunction).  - Mitral valve: There was mild regurgitation.   Assessment and Plan:  1.  Episode of syncope, occurred back in November 2021 without recurrence.  Possibly neurally mediated syncope after large meal, she has had intermittent lower abdominal pain which may or may not be related, but could precipitate vagal response.  Could also have been transient symptomatic bradycardia or pause. ECG today shows sinus rhythm with normal intervals, she is on Cartia XT for treatment of known PSVT.  She has had no palpitations.  Plan is to obtain a 7-day Zio patch for screening of cardiac rhythm, otherwise continue with observation unless symptoms increase in frequency.  2.  Known history of PSVT, relatively quiescent on Cartia XT.  Medication Adjustments/Labs and Tests Ordered: Current  medicines are reviewed at length with the patient today.  Concerns regarding medicines are outlined above.   Tests Ordered: Orders Placed This Encounter  Procedures  . LONG TERM MONITOR (3-14 DAYS)  . EKG 12-Lead    Medication Changes: No orders of the defined types were placed in this encounter.   Disposition:  Follow up 6 months in the [place] office.  Signed, Satira Sark, MD, [facility] 09/27/2020 1:35 PM    [facility] at [facility] [address], [place], [place] [address] Phone: [phone]; Fax: [phone]

## 2020-10-02 DIAGNOSIS — E119 Type 2 diabetes mellitus without complications: Secondary | ICD-10-CM | POA: Diagnosis not present

## 2020-10-04 ENCOUNTER — Other Ambulatory Visit (HOSPITAL_COMMUNITY): Payer: Self-pay | Admitting: *Deleted

## 2020-10-04 DIAGNOSIS — E559 Vitamin D deficiency, unspecified: Secondary | ICD-10-CM

## 2020-10-04 DIAGNOSIS — N183 Chronic kidney disease, stage 3 unspecified: Secondary | ICD-10-CM

## 2020-10-04 DIAGNOSIS — D631 Anemia in chronic kidney disease: Secondary | ICD-10-CM

## 2020-10-05 ENCOUNTER — Encounter (HOSPITAL_COMMUNITY): Payer: Self-pay | Admitting: Hematology

## 2020-10-05 ENCOUNTER — Other Ambulatory Visit: Payer: Self-pay

## 2020-10-05 ENCOUNTER — Inpatient Hospital Stay (HOSPITAL_COMMUNITY): Payer: Medicare Other

## 2020-10-05 ENCOUNTER — Inpatient Hospital Stay (HOSPITAL_COMMUNITY): Payer: Medicare Other | Attending: Hematology

## 2020-10-05 ENCOUNTER — Inpatient Hospital Stay (HOSPITAL_BASED_OUTPATIENT_CLINIC_OR_DEPARTMENT_OTHER): Payer: Medicare Other | Admitting: Hematology

## 2020-10-05 VITALS — BP 112/59 | HR 78 | Temp 97.1°F | Resp 16

## 2020-10-05 DIAGNOSIS — E559 Vitamin D deficiency, unspecified: Secondary | ICD-10-CM | POA: Insufficient documentation

## 2020-10-05 DIAGNOSIS — I129 Hypertensive chronic kidney disease with stage 1 through stage 4 chronic kidney disease, or unspecified chronic kidney disease: Secondary | ICD-10-CM | POA: Diagnosis not present

## 2020-10-05 DIAGNOSIS — Z79899 Other long term (current) drug therapy: Secondary | ICD-10-CM | POA: Insufficient documentation

## 2020-10-05 DIAGNOSIS — N1832 Chronic kidney disease, stage 3b: Secondary | ICD-10-CM

## 2020-10-05 DIAGNOSIS — Z8614 Personal history of Methicillin resistant Staphylococcus aureus infection: Secondary | ICD-10-CM | POA: Insufficient documentation

## 2020-10-05 DIAGNOSIS — D631 Anemia in chronic kidney disease: Secondary | ICD-10-CM

## 2020-10-05 DIAGNOSIS — E785 Hyperlipidemia, unspecified: Secondary | ICD-10-CM | POA: Insufficient documentation

## 2020-10-05 DIAGNOSIS — D509 Iron deficiency anemia, unspecified: Secondary | ICD-10-CM | POA: Diagnosis not present

## 2020-10-05 DIAGNOSIS — F0390 Unspecified dementia without behavioral disturbance: Secondary | ICD-10-CM | POA: Diagnosis not present

## 2020-10-05 DIAGNOSIS — Z7982 Long term (current) use of aspirin: Secondary | ICD-10-CM | POA: Insufficient documentation

## 2020-10-05 DIAGNOSIS — E538 Deficiency of other specified B group vitamins: Secondary | ICD-10-CM | POA: Diagnosis not present

## 2020-10-05 DIAGNOSIS — N183 Chronic kidney disease, stage 3 unspecified: Secondary | ICD-10-CM

## 2020-10-05 DIAGNOSIS — E119 Type 2 diabetes mellitus without complications: Secondary | ICD-10-CM | POA: Insufficient documentation

## 2020-10-05 LAB — CBC WITH DIFFERENTIAL/PLATELET
Abs Immature Granulocytes: 0.01 10*3/uL (ref 0.00–0.07)
Basophils Absolute: 0 10*3/uL (ref 0.0–0.1)
Basophils Relative: 1 %
Eosinophils Absolute: 0.1 10*3/uL (ref 0.0–0.5)
Eosinophils Relative: 1 %
HCT: 34.5 % — ABNORMAL LOW (ref 36.0–46.0)
Hemoglobin: 11.1 g/dL — ABNORMAL LOW (ref 12.0–15.0)
Immature Granulocytes: 0 %
Lymphocytes Relative: 49 %
Lymphs Abs: 2.8 10*3/uL (ref 0.7–4.0)
MCH: 32.1 pg (ref 26.0–34.0)
MCHC: 32.2 g/dL (ref 30.0–36.0)
MCV: 99.7 fL (ref 80.0–100.0)
Monocytes Absolute: 0.4 10*3/uL (ref 0.1–1.0)
Monocytes Relative: 6 %
Neutro Abs: 2.4 10*3/uL (ref 1.7–7.7)
Neutrophils Relative %: 43 %
Platelets: 205 10*3/uL (ref 150–400)
RBC: 3.46 MIL/uL — ABNORMAL LOW (ref 3.87–5.11)
RDW: 14.3 % (ref 11.5–15.5)
WBC: 5.7 10*3/uL (ref 4.0–10.5)
nRBC: 0 % (ref 0.0–0.2)

## 2020-10-05 LAB — COMPREHENSIVE METABOLIC PANEL
ALT: 10 U/L (ref 0–44)
AST: 15 U/L (ref 15–41)
Albumin: 3.6 g/dL (ref 3.5–5.0)
Alkaline Phosphatase: 101 U/L (ref 38–126)
Anion gap: 7 (ref 5–15)
BUN: 28 mg/dL — ABNORMAL HIGH (ref 8–23)
CO2: 23 mmol/L (ref 22–32)
Calcium: 9.2 mg/dL (ref 8.9–10.3)
Chloride: 111 mmol/L (ref 98–111)
Creatinine, Ser: 1.65 mg/dL — ABNORMAL HIGH (ref 0.44–1.00)
GFR, Estimated: 30 mL/min — ABNORMAL LOW (ref >60)
Glucose, Bld: 151 mg/dL — ABNORMAL HIGH (ref 70–99)
Potassium: 3.9 mmol/L (ref 3.5–5.1)
Sodium: 141 mmol/L (ref 135–145)
Total Bilirubin: 0.9 mg/dL (ref 0.3–1.2)
Total Protein: 7.2 g/dL (ref 6.5–8.1)

## 2020-10-05 LAB — IRON AND TIBC
Iron: 81 ug/dL (ref 28–170)
Saturation Ratios: 32 % — ABNORMAL HIGH (ref 10.4–31.8)
TIBC: 254 ug/dL (ref 250–450)
UIBC: 173 ug/dL

## 2020-10-05 LAB — FOLATE: Folate: 13.1 ng/mL (ref >5.9)

## 2020-10-05 LAB — VITAMIN D 25 HYDROXY (VIT D DEFICIENCY, FRACTURES): Vit D, 25-Hydroxy: 31.27 ng/mL (ref 30–100)

## 2020-10-05 LAB — FERRITIN: Ferritin: 227 ng/mL (ref 11–307)

## 2020-10-05 LAB — VITAMIN B12: Vitamin B-12: 912 pg/mL (ref 180–914)

## 2020-10-05 NOTE — Progress Notes (Signed)
hgb today 11.1, per Dr. Delton Coombes, no aranesp needed today.

## 2020-10-05 NOTE — Patient Instructions (Signed)
[place] at [facility] Discharge Instructions  You were seen today by Dr. Delton Coombes. He went over your recent results. You do not require your Aranesp injection today. Please return in 3 months to have your blood work rechecked. Drink plenty of water daily to keep your kidneys flushed. Your next visit will be in 6 months with the nurse practitioner for labs and follow up.   Thank you for choosing [place] at [facility] to provide your oncology and hematology care.  To afford each patient quality time with our provider, please arrive at least 15 minutes before your scheduled appointment time.   If you have a lab appointment with the [facility] please come in thru the Main Entrance and check in at the main information desk  You need to re-schedule your appointment should you arrive 10 or more minutes late.  We strive to give you quality time with our providers, and arriving late affects you and other patients whose appointments are after yours.  Also, if you no show three or more times for appointments you may be dismissed from the clinic at the providers discretion.     Again, thank you for choosing [facility].  Our hope is that these requests will decrease the amount of time that you wait before being seen by our physicians.       _____________________________________________________________  Should you have questions after your visit to [facility], please contact our office at [phone] between the hours of 8:00 a.m. and 4:30 p.m.  Voicemails left after 4:00 p.m. will not be returned until the following business day.  For prescription refill requests, have your pharmacy contact our office and allow 72 hours.    Cancer Center Support Programs:   > Cancer Support Group  2nd Tuesday of the month 1pm-2pm, Journey Room

## 2020-10-05 NOTE — Progress Notes (Signed)
Patient was assessed by Dr. Delton Coombes and labs have been reviewed. No aranesp  today. Primary RN and pharmacy aware.

## 2020-10-05 NOTE — Progress Notes (Signed)
[facility] [place], [place] [address]   CLINIC:  Medical Oncology/Hematology  PCP:  [name], MD [address] / [place] [address]  [phone]  REASON FOR VISIT:  Follow-up for anemia in chronic kidney disease  PRIOR THERAPY: None  CURRENT THERAPY: Aranesp 50 mcg injection every 12 weeks  INTERVAL HISTORY:  Laura Davenport, a 87 y.o. female, returns for routine follow-up for her anemia in chronic kidney disease. Laura Davenport was last seen by Faythe Casa, NP, on 07/13/2020.  Today Laura Davenport is accompanied by her daughter and Laura Davenport reports feeling well. Laura Davenport denies having hematuria, melena or hematochezia. Laura Davenport continues taking vitamin B12, but is not taking iron tablets. Her appetite is excellent.   REVIEW OF SYSTEMS:  Review of Systems  Constitutional: Negative for appetite change and fatigue.  Gastrointestinal: Negative for blood in stool.  Genitourinary: Negative for hematuria.   All other systems reviewed and are negative.   PAST MEDICAL/SURGICAL HISTORY:  Past Medical History:  Diagnosis Date  . Anemia in chronic kidney disease   . B12 deficiency anemia   . Chronic renal disease, stage 3, moderately decreased glomerular filtration rate (GFR) between 30-59 mL/min/1.73 square meter (HCC)   . Dementia ([place])   . Depression   . Essential hypertension   . GI bleed    Severe gastritis and duodenal ulcers by EGD May 2016   . Hyperlipidemia   . PSVT (paroxysmal supraventricular tachycardia) ([place])   . Septic arthritis (HCC)    MRSA, left knee   . Type 2 diabetes mellitus ([place])    Past Surgical History:  Procedure Laterality Date  . CHOLECYSTECTOMY N/A 11/04/2012   Procedure: LAPAROSCOPIC CHOLECYSTECTOMY;  Surgeon: Jamesetta So, MD;  Location: [facility];  Service: General;  Laterality: N/A;  . ESOPHAGOGASTRODUODENOSCOPY N/A 01/06/2015   SLF: mild esophagitis  . I & D EXTREMITY Left 12/24/2014   Procedure: IRRIGATION AND DEBRIDEMENT  EXTREMITY AND ARTHROSCOPY KNEE;  Surgeon: Renette Butters, MD;  Location: [facility];  Service: Orthopedics;  Laterality: Left;  . KNEE SURGERY    . SAVORY DILATION  01/06/2015   Procedure: SAVORY DILATION;  Surgeon: Danie Binder, MD;  [facility];  Service: Endoscopy;;    SOCIAL HISTORY:  Social History   Socioeconomic History  . Marital status: Widowed    Spouse name: Not on file  . Number of children: 26  . Years of education: Not on file  . Highest education level: Not on file  Occupational History  . Occupation: retired; Education officer, museum  Tobacco Use  . Smoking status: Never Smoker  . Smokeless tobacco: Never Used  . Tobacco comment: Never smoked  Substance and Sexual Activity  . Alcohol use: No    Alcohol/week: 0.0 standard drinks  . Drug use: No  . Sexual activity: Not on file  Other Topics Concern  . Not on file  Social History Narrative   Lives alone   3 daughters & granddaughter nearby   Lost 1 son-strep         Social Determinants of Health   Financial Resource Strain: Low Risk   . Difficulty of Paying Living Expenses: Not hard at all  Food Insecurity: No Food Insecurity  . Worried About Charity fundraiser in the Last Year: Never true  . Ran Out of Food in the Last Year: Never true  Transportation Needs: No Transportation Needs  . Lack of Transportation (Medical): No  . Lack  of Transportation (Non-Medical): No  Physical Activity: Inactive  . Days of Exercise per Week: 0 days  . Minutes of Exercise per Session: 0 min  Stress: No Stress Concern Present  . Feeling of Stress : Not at all  Social Connections: Moderately Isolated  . Frequency of Communication with Friends and Family: Three times a week  . Frequency of Social Gatherings with Friends and Family: More than three times a week  . Attends Religious Services: More than 4 times per year  . Active Member of Clubs or Organizations: No  . Attends Archivist Meetings: Never  . Marital  Status: Widowed  Intimate Partner Violence: Not At Risk  . Fear of Current or Ex-Partner: No  . Emotionally Abused: No  . Physically Abused: No  . Sexually Abused: No    FAMILY HISTORY:  Family History  Family history unknown: Yes    CURRENT MEDICATIONS:  Current Outpatient Medications  Medication Sig Dispense Refill  . aspirin EC 81 MG EC tablet Take 1 tablet (81 mg total) by mouth daily.    Marland Kitchen CARTIA XT 240 MG 24 hr capsule Take 240 mg by mouth daily.     Marland Kitchen donepezil (ARICEPT) 10 MG tablet Take 10 mg by mouth at bedtime.     Marland Kitchen losartan (COZAAR) 100 MG tablet Take 100 mg by mouth daily.     . pantoprazole (PROTONIX) 40 MG tablet TAKE 1 TABLET(40 MG) BY MOUTH DAILY BEFORE BREAKFAST 90 tablet 3  . vitamin B-12 (CYANOCOBALAMIN) 250 MCG tablet Take 250 mcg by mouth daily.     No current facility-administered medications for this visit.    ALLERGIES:  Allergies  Allergen Reactions  . Oxycodone Hives    PHYSICAL EXAM:  Performance status (ECOG): 1 - Symptomatic but completely ambulatory  Vitals:   10/05/20 1131  BP: (!) 112/59  Pulse: 78  Resp: 16  Temp: (!) 97.1 F (36.2 C)  SpO2: 100%   Wt Readings from Last 3 Encounters:  09/27/20 119 lb (54 kg)  04/19/20 116 lb 10 oz (52.9 kg)  01/26/20 121 lb 1.6 oz (54.9 kg)   Physical Exam Vitals reviewed.  Constitutional:      Appearance: Normal appearance.     Comments: In wheelchair  Cardiovascular:     Rate and Rhythm: Normal rate and regular rhythm.     Pulses: Normal pulses.     Heart sounds: Normal heart sounds.  Pulmonary:     Effort: Pulmonary effort is normal.     Breath sounds: Normal breath sounds.  Neurological:     General: No focal deficit present.     Mental Status: Laura Davenport is alert and oriented to person, place, and time.     Motor: Tremor present.  Psychiatric:        Mood and Affect: Mood normal.        Behavior: Behavior normal.     LABORATORY DATA:  I have reviewed the labs as listed.  CBC  Latest Ref Rng & Units 10/05/2020 07/13/2020 04/19/2020  WBC 4.0 - 10.5 K/uL 5.7 6.2 5.3  Hemoglobin 12.0 - 15.0 g/dL 11.1(L) 11.1(L) 10.7(L)  Hematocrit 36.0 - 46.0 % 34.5(L) 34.2(L) 33.0(L)  Platelets 150 - 400 K/uL 205 198 192   CMP Latest Ref Rng & Units 10/05/2020 07/13/2020 04/19/2020  Glucose 70 - 99 mg/dL 151(H) 92 86  BUN 8 - 23 mg/dL 28(H) 32(H) 17  Creatinine 0.44 - 1.00 mg/dL 1.65(H) 1.71(H) 1.45(H)  Sodium 135 - 145 mmol/L  141 144 143  Potassium 3.5 - 5.1 mmol/L 3.9 4.6 3.5  Chloride 98 - 111 mmol/L 111 108 114(H)  CO2 22 - 32 mmol/L '23 26 23  '$ Calcium 8.9 - 10.3 mg/dL 9.2 9.5 9.0  Total Protein 6.5 - 8.1 g/dL 7.2 7.5 6.8  Total Bilirubin 0.3 - 1.2 mg/dL 0.9 0.9 0.5  Alkaline Phos 38 - 126 U/L 101 61 58  AST 15 - 41 U/L 15 14(L) 14(L)  ALT 0 - 44 U/L '10 10 8      '$ Component Value Date/Time   RBC 3.46 (L) 10/05/2020 0937   MCV 99.7 10/05/2020 0937   MCH 32.1 10/05/2020 0937   MCHC 32.2 10/05/2020 0937   RDW 14.3 10/05/2020 0937   LYMPHSABS 2.8 10/05/2020 0937   MONOABS 0.4 10/05/2020 0937   EOSABS 0.1 10/05/2020 0937   BASOSABS 0.0 10/05/2020 0937   Lab Results  Component Value Date   TIBC 254 10/05/2020   TIBC 282 07/13/2020   TIBC 219 (L) 04/19/2020   FERRITIN 227 10/05/2020   FERRITIN 189 07/13/2020   FERRITIN 161 04/19/2020   IRONPCTSAT 32 (H) 10/05/2020   IRONPCTSAT 22 07/13/2020   IRONPCTSAT 21 04/19/2020    DIAGNOSTIC IMAGING:  I have independently reviewed the scans and discussed with the patient. No results found.   ASSESSMENT:  1.  Iron deficiency anemia: - Anemia felt to be secondary due to chronic kidney disease. -Patient had an EGD revealing erosive gastritis and moderate duodenitis. -Laura Davenport is currently receiving Aranesp 50 mcg every 12 weeks as needed. -Work-up labs done on 04/28/2019 showed her hemoglobin 9.9, ferritin 240, percent saturation 30, platelets 215, WBC 7.0.  M spike not observed.  Creatinine 1.59 -Laura Davenport denies any bright red bleeding  per rectum or melena.    PLAN:  1.  Iron deficiency anemia: -Laura Davenport is currently receiving Aranesp injections every 12 weeks. -Reviewed labs which showed hemoglobin 11.1.  Laura Davenport does not require any injection today. -Ferritin is 227 and percent saturation is 32.  No parenteral iron therapy needed. -RTC 24 weeks with repeat labs.  2.  Vitamin B12 deficiency: -Continue B12 tablet daily.  B12 level is normal.  3.  Chronic kidney disease: -Creatinine is 1.65 and stable.  4.  Vitamin D deficiency: -Vitamin D level is 31.27.  Continue vitamin D at home.   Orders placed this encounter:  No orders of the defined types were placed in this encounter.    Derek Jack, MD [place] [phone]   I, Milinda Antis, am acting as a scribe for Dr. Sanda Linger.  I, Derek Jack MD, have reviewed the above documentation for accuracy and completeness, and I agree with the above.

## 2020-10-06 ENCOUNTER — Other Ambulatory Visit (HOSPITAL_COMMUNITY): Payer: Self-pay

## 2020-10-06 DIAGNOSIS — N1832 Chronic kidney disease, stage 3b: Secondary | ICD-10-CM

## 2020-10-06 DIAGNOSIS — D631 Anemia in chronic kidney disease: Secondary | ICD-10-CM

## 2020-10-06 DIAGNOSIS — E559 Vitamin D deficiency, unspecified: Secondary | ICD-10-CM

## 2020-10-19 DIAGNOSIS — R55 Syncope and collapse: Secondary | ICD-10-CM | POA: Diagnosis not present

## 2020-11-01 ENCOUNTER — Encounter: Payer: Self-pay | Admitting: Internal Medicine

## 2020-12-28 ENCOUNTER — Inpatient Hospital Stay (HOSPITAL_COMMUNITY): Payer: Medicare Other

## 2020-12-28 ENCOUNTER — Inpatient Hospital Stay (HOSPITAL_COMMUNITY): Payer: Medicare Other | Attending: Hematology

## 2020-12-28 ENCOUNTER — Other Ambulatory Visit: Payer: Self-pay

## 2020-12-28 DIAGNOSIS — D631 Anemia in chronic kidney disease: Secondary | ICD-10-CM | POA: Diagnosis not present

## 2020-12-28 DIAGNOSIS — N189 Chronic kidney disease, unspecified: Secondary | ICD-10-CM | POA: Diagnosis not present

## 2020-12-28 DIAGNOSIS — N1832 Chronic kidney disease, stage 3b: Secondary | ICD-10-CM

## 2020-12-28 DIAGNOSIS — Z79899 Other long term (current) drug therapy: Secondary | ICD-10-CM | POA: Insufficient documentation

## 2020-12-28 DIAGNOSIS — I129 Hypertensive chronic kidney disease with stage 1 through stage 4 chronic kidney disease, or unspecified chronic kidney disease: Secondary | ICD-10-CM | POA: Insufficient documentation

## 2020-12-28 LAB — CBC WITH DIFFERENTIAL/PLATELET
Abs Immature Granulocytes: 0.02 10*3/uL (ref 0.00–0.07)
Basophils Absolute: 0 10*3/uL (ref 0.0–0.1)
Basophils Relative: 1 %
Eosinophils Absolute: 0.1 10*3/uL (ref 0.0–0.5)
Eosinophils Relative: 1 %
HCT: 34.5 % — ABNORMAL LOW (ref 36.0–46.0)
Hemoglobin: 11.2 g/dL — ABNORMAL LOW (ref 12.0–15.0)
Immature Granulocytes: 0 %
Lymphocytes Relative: 33 %
Lymphs Abs: 2 10*3/uL (ref 0.7–4.0)
MCH: 33 pg (ref 26.0–34.0)
MCHC: 32.5 g/dL (ref 30.0–36.0)
MCV: 101.8 fL — ABNORMAL HIGH (ref 80.0–100.0)
Monocytes Absolute: 0.4 10*3/uL (ref 0.1–1.0)
Monocytes Relative: 6 %
Neutro Abs: 3.7 10*3/uL (ref 1.7–7.7)
Neutrophils Relative %: 59 %
Platelets: 207 10*3/uL (ref 150–400)
RBC: 3.39 MIL/uL — ABNORMAL LOW (ref 3.87–5.11)
RDW: 14.2 % (ref 11.5–15.5)
WBC: 6.2 10*3/uL (ref 4.0–10.5)
nRBC: 0 % (ref 0.0–0.2)

## 2020-12-28 NOTE — Progress Notes (Signed)
Patient here today for Aranesp injection.  Hemoglobin today was 11.2  Aranesp was not given.  Patient left in satisfactory condition with no complaints voiced.

## 2021-01-01 DIAGNOSIS — E119 Type 2 diabetes mellitus without complications: Secondary | ICD-10-CM | POA: Diagnosis not present

## 2021-02-15 ENCOUNTER — Other Ambulatory Visit: Payer: Self-pay | Admitting: Nurse Practitioner

## 2021-02-15 ENCOUNTER — Encounter: Payer: Self-pay | Admitting: Internal Medicine

## 2021-02-15 DIAGNOSIS — K219 Gastro-esophageal reflux disease without esophagitis: Secondary | ICD-10-CM

## 2021-02-15 NOTE — Telephone Encounter (Signed)
Refilling once. Needs office visit.

## 2021-03-21 ENCOUNTER — Other Ambulatory Visit (HOSPITAL_COMMUNITY): Payer: Self-pay | Admitting: Physician Assistant

## 2021-03-21 DIAGNOSIS — D631 Anemia in chronic kidney disease: Secondary | ICD-10-CM

## 2021-03-21 NOTE — Progress Notes (Signed)
[facility] [place], [place] [address]   CLINIC:  Medical Oncology/Hematology  PCP:  [name], MD [address] [place] [address] [phone]   REASON FOR VISIT:  Follow-up for anemia of CKD  PRIOR THERAPY: None  CURRENT THERAPY: Aranesp 50 mcg injection every 12 weeks (has been held for the last 3 visits, last given on 04/19/2020)  INTERVAL HISTORY:  Laura Davenport 87 y.o. female returns for routine follow-up of her anemia of CKD and relative iron deficiency.  She was last seen by Dr. Delton Coombes on 10/05/2020.  At today's visit, she reports feeling fairly well.  No recent hospitalizations, surgeries, or changes in baseline health status.  She denies any current signs or symptoms of bleeding such as epistaxis, melena, or bright red blood per rectum.  She does have some chronic fatigue, but denies chest pain, dyspnea, syncope, or palpitations.  No fever, chills, night sweats, unintentional weight loss.  She has 50% energy and 50% appetite. She endorses that she is maintaining a stable weight.    REVIEW OF SYSTEMS:  Review of Systems  Constitutional:  Positive for appetite change (50%) and fatigue (50%). Negative for chills, diaphoresis, fever and unexpected weight change.  HENT:   Negative for lump/mass and nosebleeds.   Eyes:  Negative for eye problems.  Respiratory:  Negative for cough, hemoptysis and shortness of breath.   Cardiovascular:  Negative for chest pain, leg swelling and palpitations.  Gastrointestinal:  Negative for abdominal pain, blood in stool, constipation, diarrhea, nausea and vomiting.  Genitourinary:  Negative for hematuria.   Skin: Negative.   Neurological:  Negative for dizziness, headaches and light-headedness.  Hematological:  Does not bruise/bleed easily.     PAST MEDICAL/SURGICAL HISTORY:  Past Medical History:  Diagnosis Date   Anemia in chronic kidney disease    B12 deficiency anemia    Chronic renal  disease, stage 3, moderately decreased glomerular filtration rate (GFR) between 30-59 mL/min/1.73 square meter (HCC)    Dementia (HCC)    Depression    Essential hypertension    GI bleed    Severe gastritis and duodenal ulcers by EGD May 2016    Hyperlipidemia    PSVT (paroxysmal supraventricular tachycardia) ([place])    Septic arthritis ([place])    MRSA, left knee    Type 2 diabetes mellitus ([place])    Past Surgical History:  Procedure Laterality Date   CHOLECYSTECTOMY N/A 11/04/2012   Procedure: LAPAROSCOPIC CHOLECYSTECTOMY;  Surgeon: Jamesetta So, MD;  Location: [facility];  Service: General;  Laterality: N/A;   ESOPHAGOGASTRODUODENOSCOPY N/A 01/06/2015   SLF: mild esophagitis   I & D EXTREMITY Left 12/24/2014   Procedure: IRRIGATION AND DEBRIDEMENT EXTREMITY AND ARTHROSCOPY KNEE;  Surgeon: Renette Butters, MD;  Location: [facility];  Service: Orthopedics;  Laterality: Left;   KNEE SURGERY     SAVORY DILATION  01/06/2015   Procedure: SAVORY DILATION;  Surgeon: Danie Binder, MD;  [facility];  Service: Endoscopy;;     SOCIAL HISTORY:  Social History   Socioeconomic History   Marital status: Widowed    Spouse name: Not on file   Number of children: 9   Years of education: Not on file   Highest education level: Not on file  Occupational History   Occupation: retired; Education officer, museum  Tobacco Use   Smoking status: Never   Smokeless tobacco: Never   Tobacco comments:    Never smoked  Substance and Sexual Activity  Alcohol use: No    Alcohol/week: 0.0 standard drinks   Drug use: No   Sexual activity: Not on file  Other Topics Concern   Not on file  Social History Narrative   Lives alone   3 daughters & granddaughter nearby   Lost 1 son-strep         Social Determinants of Health   Financial Resource Strain: Low Risk    Difficulty of Paying Living Expenses: Not hard at all  Food Insecurity: No Food Insecurity   Worried About Charity fundraiser in the Last Year: Never  true   Arboriculturist in the Last Year: Never true  Transportation Needs: No Transportation Needs   Lack of Transportation (Medical): No   Lack of Transportation (Non-Medical): No  Physical Activity: Inactive   Days of Exercise per Week: 0 days   Minutes of Exercise per Session: 0 min  Stress: No Stress Concern Present   Feeling of Stress : Not at all  Social Connections: Moderately Isolated   Frequency of Communication with Friends and Family: Three times a week   Frequency of Social Gatherings with Friends and Family: More than three times a week   Attends Religious Services: More than 4 times per year   Active Member of Clubs or Organizations: No   Attends Archivist Meetings: Never   Marital Status: Widowed  Human resources officer Violence: Not At Risk   Fear of Current or Ex-Partner: No   Emotionally Abused: No   Physically Abused: No   Sexually Abused: No    FAMILY HISTORY:  Family History  Family history unknown: Yes    CURRENT MEDICATIONS:  Outpatient Encounter Medications as of 03/22/2021  Medication Sig   aspirin EC 81 MG EC tablet Take 1 tablet (81 mg total) by mouth daily.   CARTIA XT 240 MG 24 hr capsule Take 240 mg by mouth daily.    donepezil (ARICEPT) 10 MG tablet Take 10 mg by mouth at bedtime.    losartan (COZAAR) 100 MG tablet Take 100 mg by mouth daily.    pantoprazole (PROTONIX) 40 MG tablet TAKE 1 TABLET(40 MG) BY MOUTH DAILY BEFORE BREAKFAST   vitamin B-12 (CYANOCOBALAMIN) 250 MCG tablet Take 250 mcg by mouth daily.   No facility-administered encounter medications on file as of 03/22/2021.    ALLERGIES:  Allergies  Allergen Reactions   Oxycodone Hives     PHYSICAL EXAM:  ECOG PERFORMANCE STATUS: 2 - Symptomatic, <50% confined to bed  There were no vitals filed for this visit. There were no vitals filed for this visit. Physical Exam Constitutional:      Appearance: Normal appearance.  HENT:     Head: Normocephalic and atraumatic.      Mouth/Throat:     Mouth: Mucous membranes are moist.  Eyes:     Extraocular Movements: Extraocular movements intact.     Pupils: Pupils are equal, round, and reactive to light.  Cardiovascular:     Rate and Rhythm: Normal rate and regular rhythm.     Pulses: Normal pulses.     Heart sounds: Normal heart sounds.  Pulmonary:     Effort: Pulmonary effort is normal.     Breath sounds: Normal breath sounds.  Abdominal:     General: Bowel sounds are normal.     Palpations: Abdomen is soft.     Tenderness: There is no abdominal tenderness.  Musculoskeletal:        General: No swelling.  Right lower leg: No edema.     Left lower leg: No edema.  Lymphadenopathy:     Cervical: No cervical adenopathy.  Skin:    General: Skin is warm and dry.  Neurological:     General: No focal deficit present.     Mental Status: She is alert. Mental status is at baseline.  Psychiatric:        Mood and Affect: Mood normal.        Behavior: Behavior normal.     LABORATORY DATA:  I have reviewed the labs as listed.  CBC    Component Value Date/Time   WBC 6.2 12/28/2020 1027   RBC 3.39 (L) 12/28/2020 1027   HGB 11.2 (L) 12/28/2020 1027   HCT 34.5 (L) 12/28/2020 1027   PLT 207 12/28/2020 1027   MCV 101.8 (H) 12/28/2020 1027   MCH 33.0 12/28/2020 1027   MCHC 32.5 12/28/2020 1027   RDW 14.2 12/28/2020 1027   LYMPHSABS 2.0 12/28/2020 1027   MONOABS 0.4 12/28/2020 1027   EOSABS 0.1 12/28/2020 1027   BASOSABS 0.0 12/28/2020 1027   CMP Latest Ref Rng & Units 10/05/2020 07/13/2020 04/19/2020  Glucose 70 - 99 mg/dL 151(H) 92 86  BUN 8 - 23 mg/dL 28(H) 32(H) 17  Creatinine 0.44 - 1.00 mg/dL 1.65(H) 1.71(H) 1.45(H)  Sodium 135 - 145 mmol/L 141 144 143  Potassium 3.5 - 5.1 mmol/L 3.9 4.6 3.5  Chloride 98 - 111 mmol/L 111 108 114(H)  CO2 22 - 32 mmol/L '23 26 23  '$ Calcium 8.9 - 10.3 mg/dL 9.2 9.5 9.0  Total Protein 6.5 - 8.1 g/dL 7.2 7.5 6.8  Total Bilirubin 0.3 - 1.2 mg/dL 0.9 0.9 0.5  Alkaline  Phos 38 - 126 U/L 101 61 58  AST 15 - 41 U/L 15 14(L) 14(L)  ALT 0 - 44 U/L '10 10 8    '$ DIAGNOSTIC IMAGING:  I have independently reviewed the relevant imaging and discussed with the patient.  ASSESSMENT & PLAN: 1.  Normocytic anemia - Anemia felt to be secondary to CKD and relative iron deficiency - Patient had an EGD on 01/06/2015 due to hematemesis, which revealed severe erosive gastritis, moderate duodenitis, and multiple duodenal ulcers - She denies any recent hematemesis, bright red blood per rectum, or melena  - SPEP negative for M spike - She is currently receiving Aranesp 50 mcg every 12 weeks as needed for Hgb < 11.0.  (Aranesp has been hold for the last 3 visits, was last given on 04/19/2020) - Most recent labs (03/22/2021): Hgb 12.2, ferritin 228, iron saturation 39% - PLAN: No indication for Aranesp today.  No need for IV iron treatment.  Repeat CBC every 12 weeks with as needed Aranesp if Hgb < 11.0.  RTC for follow-up visit in 24 weeks.  If patient has not required Aranesp at time of next appointment, will consider discontinuation of protocol.   2.  Vitamin B12 deficiency: -Most recent labs (03/22/2021) showed normal B12 level 1413.  Methylmalonic acid is pending. - PLAN: Continue daily B12 tablet   3.  Chronic kidney disease: -Creatinine has been stable at baseline - PLAN: Repeat CMP at follow-up visit   4.  Vitamin D deficiency: - Vitamin D level is 31.27.  Continue vitamin D at home. - Patient is not currently taking vitamin D supplementation at home - PLAN: Vitamin D level is pending.  We will recheck at follow-up visit as well.   PLAN SUMMARY & DISPOSITION: -Continue CBC every 12 weeks  with as needed Aranesp if Hgb less than 11.0 - Office visit in 24 weeks  All questions were answered. The patient knows to call the clinic with any problems, questions or concerns.  Medical decision making: Low  Time spent on visit: I spent 15 minutes counseling the patient face to  face. The total time spent in the appointment was 25 minutes and more than 50% was on counseling.   Harriett Rush, PA-C  03/22/2021 11:57 AM

## 2021-03-22 ENCOUNTER — Inpatient Hospital Stay (HOSPITAL_BASED_OUTPATIENT_CLINIC_OR_DEPARTMENT_OTHER): Payer: Medicare Other | Admitting: Physician Assistant

## 2021-03-22 ENCOUNTER — Other Ambulatory Visit: Payer: Self-pay

## 2021-03-22 ENCOUNTER — Inpatient Hospital Stay (HOSPITAL_COMMUNITY): Payer: Medicare Other | Attending: Hematology

## 2021-03-22 ENCOUNTER — Inpatient Hospital Stay (HOSPITAL_COMMUNITY): Payer: Medicare Other

## 2021-03-22 ENCOUNTER — Ambulatory Visit (HOSPITAL_COMMUNITY): Payer: Medicare Other | Admitting: Hematology

## 2021-03-22 VITALS — BP 162/79 | HR 86 | Temp 97.0°F | Resp 18 | Wt 121.3 lb

## 2021-03-22 DIAGNOSIS — K264 Chronic or unspecified duodenal ulcer with hemorrhage: Secondary | ICD-10-CM | POA: Insufficient documentation

## 2021-03-22 DIAGNOSIS — Z79899 Other long term (current) drug therapy: Secondary | ICD-10-CM | POA: Insufficient documentation

## 2021-03-22 DIAGNOSIS — N1832 Chronic kidney disease, stage 3b: Secondary | ICD-10-CM | POA: Diagnosis not present

## 2021-03-22 DIAGNOSIS — K2960 Other gastritis without bleeding: Secondary | ICD-10-CM | POA: Diagnosis not present

## 2021-03-22 DIAGNOSIS — E538 Deficiency of other specified B group vitamins: Secondary | ICD-10-CM | POA: Diagnosis not present

## 2021-03-22 DIAGNOSIS — D631 Anemia in chronic kidney disease: Secondary | ICD-10-CM | POA: Diagnosis not present

## 2021-03-22 DIAGNOSIS — E559 Vitamin D deficiency, unspecified: Secondary | ICD-10-CM

## 2021-03-22 DIAGNOSIS — Z8719 Personal history of other diseases of the digestive system: Secondary | ICD-10-CM | POA: Insufficient documentation

## 2021-03-22 DIAGNOSIS — K2980 Duodenitis without bleeding: Secondary | ICD-10-CM | POA: Insufficient documentation

## 2021-03-22 DIAGNOSIS — Z885 Allergy status to narcotic agent status: Secondary | ICD-10-CM | POA: Insufficient documentation

## 2021-03-22 DIAGNOSIS — Z9049 Acquired absence of other specified parts of digestive tract: Secondary | ICD-10-CM | POA: Diagnosis not present

## 2021-03-22 DIAGNOSIS — R5382 Chronic fatigue, unspecified: Secondary | ICD-10-CM | POA: Insufficient documentation

## 2021-03-22 LAB — CBC WITH DIFFERENTIAL/PLATELET
Abs Immature Granulocytes: 0.01 10*3/uL (ref 0.00–0.07)
Basophils Absolute: 0 10*3/uL (ref 0.0–0.1)
Basophils Relative: 1 %
Eosinophils Absolute: 0.1 10*3/uL (ref 0.0–0.5)
Eosinophils Relative: 1 %
HCT: 36.9 % (ref 36.0–46.0)
Hemoglobin: 12.2 g/dL (ref 12.0–15.0)
Immature Granulocytes: 0 %
Lymphocytes Relative: 53 %
Lymphs Abs: 3.1 10*3/uL (ref 0.7–4.0)
MCH: 32.4 pg (ref 26.0–34.0)
MCHC: 33.1 g/dL (ref 30.0–36.0)
MCV: 97.9 fL (ref 80.0–100.0)
Monocytes Absolute: 0.4 10*3/uL (ref 0.1–1.0)
Monocytes Relative: 7 %
Neutro Abs: 2.3 10*3/uL (ref 1.7–7.7)
Neutrophils Relative %: 38 %
Platelets: 195 10*3/uL (ref 150–400)
RBC: 3.77 MIL/uL — ABNORMAL LOW (ref 3.87–5.11)
RDW: 13.5 % (ref 11.5–15.5)
WBC: 6 10*3/uL (ref 4.0–10.5)
nRBC: 0 % (ref 0.0–0.2)

## 2021-03-22 LAB — IRON AND TIBC
Iron: 85 ug/dL (ref 28–170)
Saturation Ratios: 39 % — ABNORMAL HIGH (ref 10.4–31.8)
TIBC: 220 ug/dL — ABNORMAL LOW (ref 250–450)
UIBC: 135 ug/dL

## 2021-03-22 LAB — VITAMIN D 25 HYDROXY (VIT D DEFICIENCY, FRACTURES): Vit D, 25-Hydroxy: 19.49 ng/mL — ABNORMAL LOW (ref 30–100)

## 2021-03-22 LAB — VITAMIN B12: Vitamin B-12: 1413 pg/mL — ABNORMAL HIGH (ref 180–914)

## 2021-03-22 LAB — FERRITIN: Ferritin: 228 ng/mL (ref 11–307)

## 2021-03-22 NOTE — Patient Instructions (Signed)
[place] at [facility] Discharge Instructions  You were seen today by Tarri Abernethy PA-C for your anemia (low blood count).  Your blood counts today looked great, it is actually within the normal range for the first time in over a year!  We will still continue to check your labs every 12 weeks and give you a shot to help your blood, if your blood levels are low.  I will see you again in 24 weeks, and we will reevaluate whether or not you need to continue these shots.  LABS: Return in 12 weeks for repeat labs  OTHER TESTS: None  MEDICATIONS: Continue previously prescribed home medications  FOLLOW-UP APPOINTMENT: Office visit in 24 weeks (about 5 to 6 months)   Thank you for choosing [place] at [facility] to provide your oncology and hematology care.  To afford each patient quality time with our provider, please arrive at least 15 minutes before your scheduled appointment time.   If you have a lab appointment with the [facility] please come in thru the Main Entrance and check in at the main information desk.  You need to re-schedule your appointment should you arrive 10 or more minutes late.  We strive to give you quality time with our providers, and arriving late affects you and other patients whose appointments are after yours.  Also, if you no show three or more times for appointments you may be dismissed from the clinic at the providers discretion.     Again, thank you for choosing [facility].  Our hope is that these requests will decrease the amount of time that you wait before being seen by our physicians.       _____________________________________________________________  Should you have questions after your visit to [facility], please contact our office at [phone] and follow the prompts.  Our office hours are 8:00 a.m. and 4:30 p.m. Monday - Friday.  Please note that voicemails left after  4:00 p.m. may not be returned until the following business day.  We are closed weekends and major holidays.  You do have access to a nurse 24-7, just call the main number to the clinic [phone] and do not press any options, hold on the line and a nurse will answer the phone.    For prescription refill requests, have your pharmacy contact our office and allow 72 hours.    Due to Covid, you will need to wear a mask upon entering the hospital. If you do not have a mask, a mask will be given to you at the Main Entrance upon arrival. For doctor visits, patients may have 1 support person age 64 or older with them. For treatment visits, patients can not have anyone with them due to social distancing guidelines and our immunocompromised population.

## 2021-03-22 NOTE — Progress Notes (Signed)
Please call patient's daughter to inform her that Vitamin D level is low.  Patient should start taking OTC Vitamin D supplement (cholecalciferol / Vitamin D3) 50 mcg (2,000 units) daily.  We will recheck her levels at next visit.

## 2021-03-22 NOTE — Progress Notes (Signed)
Hgb 12.2 today.  No injection per PA.

## 2021-03-23 ENCOUNTER — Encounter (HOSPITAL_COMMUNITY): Payer: Self-pay

## 2021-03-23 NOTE — Progress Notes (Signed)
Result note received from [facility] [place] that the patient should begin VitD OTC supplement. Called daughter and made her aware. Verbalized understanding and will begin supplement today.

## 2021-03-25 LAB — METHYLMALONIC ACID, SERUM: Methylmalonic Acid, Quantitative: 190 nmol/L (ref 0–378)

## 2021-04-03 DIAGNOSIS — E119 Type 2 diabetes mellitus without complications: Secondary | ICD-10-CM | POA: Diagnosis not present

## 2021-06-01 ENCOUNTER — Other Ambulatory Visit: Payer: Self-pay | Admitting: Gastroenterology

## 2021-06-01 DIAGNOSIS — K219 Gastro-esophageal reflux disease without esophagitis: Secondary | ICD-10-CM

## 2021-06-06 DIAGNOSIS — N184 Chronic kidney disease, stage 4 (severe): Secondary | ICD-10-CM | POA: Diagnosis not present

## 2021-06-06 DIAGNOSIS — G309 Alzheimer's disease, unspecified: Secondary | ICD-10-CM | POA: Diagnosis not present

## 2021-06-06 DIAGNOSIS — I471 Supraventricular tachycardia: Secondary | ICD-10-CM | POA: Diagnosis not present

## 2021-06-06 DIAGNOSIS — D630 Anemia in neoplastic disease: Secondary | ICD-10-CM | POA: Diagnosis not present

## 2021-06-06 DIAGNOSIS — Z23 Encounter for immunization: Secondary | ICD-10-CM | POA: Diagnosis not present

## 2021-06-14 ENCOUNTER — Other Ambulatory Visit: Payer: Self-pay

## 2021-06-14 ENCOUNTER — Inpatient Hospital Stay (HOSPITAL_COMMUNITY): Payer: Medicare Other | Attending: Hematology

## 2021-06-14 ENCOUNTER — Inpatient Hospital Stay (HOSPITAL_COMMUNITY): Payer: Medicare Other

## 2021-06-14 ENCOUNTER — Other Ambulatory Visit (HOSPITAL_COMMUNITY)
Admission: RE | Admit: 2021-06-14 | Discharge: 2021-06-14 | Disposition: A | Discharge status: Home / Self Care | Payer: Medicare Other | Source: Ambulatory Visit | Attending: Internal Medicine | Admitting: Internal Medicine

## 2021-06-14 DIAGNOSIS — I471 Supraventricular tachycardia: Secondary | ICD-10-CM | POA: Diagnosis not present

## 2021-06-14 DIAGNOSIS — D631 Anemia in chronic kidney disease: Secondary | ICD-10-CM | POA: Diagnosis not present

## 2021-06-14 DIAGNOSIS — N184 Chronic kidney disease, stage 4 (severe): Secondary | ICD-10-CM | POA: Diagnosis not present

## 2021-06-14 DIAGNOSIS — N1832 Chronic kidney disease, stage 3b: Secondary | ICD-10-CM

## 2021-06-14 DIAGNOSIS — I129 Hypertensive chronic kidney disease with stage 1 through stage 4 chronic kidney disease, or unspecified chronic kidney disease: Secondary | ICD-10-CM | POA: Insufficient documentation

## 2021-06-14 DIAGNOSIS — E1122 Type 2 diabetes mellitus with diabetic chronic kidney disease: Secondary | ICD-10-CM | POA: Insufficient documentation

## 2021-06-14 DIAGNOSIS — N183 Chronic kidney disease, stage 3 unspecified: Secondary | ICD-10-CM | POA: Insufficient documentation

## 2021-06-14 LAB — CBC WITH DIFFERENTIAL/PLATELET
Abs Immature Granulocytes: 0.01 10*3/uL (ref 0.00–0.07)
Basophils Absolute: 0 10*3/uL (ref 0.0–0.1)
Basophils Relative: 1 %
Eosinophils Absolute: 0.1 10*3/uL (ref 0.0–0.5)
Eosinophils Relative: 1 %
HCT: 34.5 % — ABNORMAL LOW (ref 36.0–46.0)
Hemoglobin: 11.3 g/dL — ABNORMAL LOW (ref 12.0–15.0)
Immature Granulocytes: 0 %
Lymphocytes Relative: 55 %
Lymphs Abs: 3 10*3/uL (ref 0.7–4.0)
MCH: 34 pg (ref 26.0–34.0)
MCHC: 32.8 g/dL (ref 30.0–36.0)
MCV: 103.9 fL — ABNORMAL HIGH (ref 80.0–100.0)
Monocytes Absolute: 0.4 10*3/uL (ref 0.1–1.0)
Monocytes Relative: 7 %
Neutro Abs: 2 10*3/uL (ref 1.7–7.7)
Neutrophils Relative %: 36 %
Platelets: 200 10*3/uL (ref 150–400)
RBC: 3.32 MIL/uL — ABNORMAL LOW (ref 3.87–5.11)
RDW: 13.9 % (ref 11.5–15.5)
WBC: 5.4 10*3/uL (ref 4.0–10.5)
nRBC: 0 % (ref 0.0–0.2)

## 2021-06-14 LAB — PHOSPHORUS: Phosphorus: 3.4 mg/dL (ref 2.5–4.6)

## 2021-06-14 LAB — MAGNESIUM: Magnesium: 2.2 mg/dL (ref 1.7–2.4)

## 2021-06-14 LAB — HEMOGLOBIN A1C
Hgb A1c MFr Bld: 5.3 % (ref 4.8–5.6)
Mean Plasma Glucose: 105.41 mg/dL

## 2021-06-14 NOTE — Progress Notes (Signed)
Aranesp held today per order parameters.  Hgb 11.3.

## 2021-06-14 NOTE — Patient Instructions (Signed)
[facility]  Discharge Instructions: Thank you for choosing [facility] to provide your oncology and hematology care.  If you have a lab appointment with the Cancer Center, please come in thru the Main Entrance and check in at the main information desk.  Wear comfortable clothing and clothing appropriate for easy access to any Portacath or PICC line.   We strive to give you quality time with your provider. You may need to reschedule your appointment if you arrive late (15 or more minutes).  Arriving late affects you and other patients whose appointments are after yours.  Also, if you miss three or more appointments without notifying the office, you may be dismissed from the clinic at the provider's discretion.      For prescription refill requests, have your pharmacy contact our office and allow 72 hours for refills to be completed.        To help prevent nausea and vomiting after your treatment, we encourage you to take your nausea medication as directed.  BELOW ARE SYMPTOMS THAT SHOULD BE REPORTED IMMEDIATELY: *FEVER GREATER THAN 100.4 F (38 C) OR HIGHER *CHILLS OR SWEATING *NAUSEA AND VOMITING THAT IS NOT CONTROLLED WITH YOUR NAUSEA MEDICATION *UNUSUAL SHORTNESS OF BREATH *UNUSUAL BRUISING OR BLEEDING *URINARY PROBLEMS (pain or burning when urinating, or frequent urination) *BOWEL PROBLEMS (unusual diarrhea, constipation, pain near the anus) TENDERNESS IN MOUTH AND THROAT WITH OR WITHOUT PRESENCE OF ULCERS (sore throat, sores in mouth, or a toothache) UNUSUAL RASH, SWELLING OR PAIN  UNUSUAL VAGINAL DISCHARGE OR ITCHING   Items with * indicate a potential emergency and should be followed up as soon as possible or go to the Emergency Department if any problems should occur.  Please show the CHEMOTHERAPY ALERT CARD or IMMUNOTHERAPY ALERT CARD at check-in to the Emergency Department and triage nurse.  Should you have questions after your visit or need to cancel  or reschedule your appointment, please contact [facility] [phone]  and follow the prompts.  Office hours are 8:00 a.m. to 4:30 p.m. Monday - Friday. Please note that voicemails left after 4:00 p.m. may not be returned until the following business day.  We are closed weekends and major holidays. You have access to a nurse at all times for urgent questions. Please call the main number to the clinic [phone] and follow the prompts.  For any non-urgent questions, you may also contact your provider using MyChart. We now offer e-Visits for anyone 18 and older to request care online for non-urgent symptoms. For details visit [email or web address].   Also download the MyChart app! Go to the app store, search "MyChart", open the app, select [facility], and log in with your MyChart username and password.  Due to Covid, a mask is required upon entering the hospital/clinic. If you do not have a mask, one will be given to you upon arrival. For doctor visits, patients may have 1 support person aged 18 or older with them. For treatment visits, patients cannot have anyone with them due to current Covid guidelines and our immunocompromised population.  

## 2021-06-16 LAB — PTH, INTACT AND CALCIUM
Calcium, Total (PTH): 9.2 mg/dL (ref 8.7–10.3)
PTH: 15 pg/mL (ref 15–65)

## 2021-06-19 ENCOUNTER — Other Ambulatory Visit: Payer: Self-pay

## 2021-06-19 ENCOUNTER — Emergency Department (HOSPITAL_COMMUNITY): Payer: Medicare Other

## 2021-06-19 ENCOUNTER — Emergency Department (HOSPITAL_COMMUNITY)
Admission: EM | Admit: 2021-06-19 | Discharge: 2021-06-20 | Disposition: A | Discharge status: Home / Self Care | Payer: Medicare Other | Attending: Emergency Medicine | Admitting: Emergency Medicine

## 2021-06-19 DIAGNOSIS — I129 Hypertensive chronic kidney disease with stage 1 through stage 4 chronic kidney disease, or unspecified chronic kidney disease: Secondary | ICD-10-CM | POA: Diagnosis not present

## 2021-06-19 DIAGNOSIS — R4020 Unspecified coma: Secondary | ICD-10-CM | POA: Diagnosis not present

## 2021-06-19 DIAGNOSIS — R0689 Other abnormalities of breathing: Secondary | ICD-10-CM | POA: Diagnosis not present

## 2021-06-19 DIAGNOSIS — Z743 Need for continuous supervision: Secondary | ICD-10-CM | POA: Diagnosis not present

## 2021-06-19 DIAGNOSIS — Z7982 Long term (current) use of aspirin: Secondary | ICD-10-CM | POA: Diagnosis not present

## 2021-06-19 DIAGNOSIS — N390 Urinary tract infection, site not specified: Secondary | ICD-10-CM | POA: Diagnosis not present

## 2021-06-19 DIAGNOSIS — E1122 Type 2 diabetes mellitus with diabetic chronic kidney disease: Secondary | ICD-10-CM | POA: Insufficient documentation

## 2021-06-19 DIAGNOSIS — F0390 Unspecified dementia without behavioral disturbance: Secondary | ICD-10-CM | POA: Insufficient documentation

## 2021-06-19 DIAGNOSIS — R6889 Other general symptoms and signs: Secondary | ICD-10-CM | POA: Diagnosis not present

## 2021-06-19 DIAGNOSIS — Z79899 Other long term (current) drug therapy: Secondary | ICD-10-CM | POA: Insufficient documentation

## 2021-06-19 DIAGNOSIS — N183 Chronic kidney disease, stage 3 unspecified: Secondary | ICD-10-CM | POA: Insufficient documentation

## 2021-06-19 DIAGNOSIS — I10 Essential (primary) hypertension: Secondary | ICD-10-CM | POA: Diagnosis not present

## 2021-06-19 DIAGNOSIS — U071 COVID-19: Secondary | ICD-10-CM | POA: Insufficient documentation

## 2021-06-19 DIAGNOSIS — I700 Atherosclerosis of aorta: Secondary | ICD-10-CM | POA: Diagnosis not present

## 2021-06-19 LAB — URINALYSIS, ROUTINE W REFLEX MICROSCOPIC
Bilirubin Urine: NEGATIVE
Glucose, UA: NEGATIVE mg/dL
Ketones, ur: NEGATIVE mg/dL
Nitrite: NEGATIVE
Protein, ur: 100 mg/dL — AB
Specific Gravity, Urine: 1.012 (ref 1.005–1.030)
WBC, UA: >50 WBC/hpf — ABNORMAL HIGH (ref 0–5)
pH: 5 (ref 5.0–8.0)

## 2021-06-19 LAB — COMPREHENSIVE METABOLIC PANEL
ALT: 10 U/L (ref 0–44)
AST: 21 U/L (ref 15–41)
Albumin: 2.9 g/dL — ABNORMAL LOW (ref 3.5–5.0)
Alkaline Phosphatase: 62 U/L (ref 38–126)
Anion gap: 6 (ref 5–15)
BUN: 24 mg/dL — ABNORMAL HIGH (ref 8–23)
CO2: 23 mmol/L (ref 22–32)
Calcium: 7.9 mg/dL — ABNORMAL LOW (ref 8.9–10.3)
Chloride: 113 mmol/L — ABNORMAL HIGH (ref 98–111)
Creatinine, Ser: 1.63 mg/dL — ABNORMAL HIGH (ref 0.44–1.00)
GFR, Estimated: 30 mL/min — ABNORMAL LOW (ref >60)
Glucose, Bld: 131 mg/dL — ABNORMAL HIGH (ref 70–99)
Potassium: 3.9 mmol/L (ref 3.5–5.1)
Sodium: 142 mmol/L (ref 135–145)
Total Bilirubin: 0.3 mg/dL (ref 0.3–1.2)
Total Protein: 6.2 g/dL — ABNORMAL LOW (ref 6.5–8.1)

## 2021-06-19 LAB — RESP PANEL BY RT-PCR (FLU A&B, COVID) ARPGX2
Influenza A by PCR: NEGATIVE
Influenza B by PCR: NEGATIVE
SARS Coronavirus 2 by RT PCR: POSITIVE — AB

## 2021-06-19 LAB — CBC WITH DIFFERENTIAL/PLATELET
Abs Immature Granulocytes: 0.01 10*3/uL (ref 0.00–0.07)
Basophils Absolute: 0 10*3/uL (ref 0.0–0.1)
Basophils Relative: 0 %
Eosinophils Absolute: 0 10*3/uL (ref 0.0–0.5)
Eosinophils Relative: 0 %
HCT: 32.7 % — ABNORMAL LOW (ref 36.0–46.0)
Hemoglobin: 10.6 g/dL — ABNORMAL LOW (ref 12.0–15.0)
Immature Granulocytes: 0 %
Lymphocytes Relative: 48 %
Lymphs Abs: 2 10*3/uL (ref 0.7–4.0)
MCH: 33.5 pg (ref 26.0–34.0)
MCHC: 32.4 g/dL (ref 30.0–36.0)
MCV: 103.5 fL — ABNORMAL HIGH (ref 80.0–100.0)
Monocytes Absolute: 0.5 10*3/uL (ref 0.1–1.0)
Monocytes Relative: 12 %
Neutro Abs: 1.7 10*3/uL (ref 1.7–7.7)
Neutrophils Relative %: 40 %
Platelets: 159 10*3/uL (ref 150–400)
RBC: 3.16 MIL/uL — ABNORMAL LOW (ref 3.87–5.11)
RDW: 13.9 % (ref 11.5–15.5)
WBC: 4.1 10*3/uL (ref 4.0–10.5)
nRBC: 0 % (ref 0.0–0.2)

## 2021-06-19 LAB — PROTIME-INR
INR: 1 (ref 0.8–1.2)
Prothrombin Time: 13.1 seconds (ref 11.4–15.2)

## 2021-06-19 LAB — APTT: aPTT: 29 seconds (ref 24–36)

## 2021-06-19 LAB — LACTIC ACID, PLASMA
Lactic Acid, Venous: 2.7 mmol/L (ref 0.5–1.9)
Lactic Acid, Venous: 1.4 mmol/L (ref 0.5–1.9)

## 2021-06-19 LAB — TROPONIN I (HIGH SENSITIVITY): Troponin I (High Sensitivity): 9 ng/L (ref <18)

## 2021-06-19 MED ORDER — ALBUTEROL SULFATE HFA 108 (90 BASE) MCG/ACT IN AERS: 2.0000 | INHALATION_SPRAY | Freq: Once | RESPIRATORY_TRACT | Status: DC | PRN

## 2021-06-19 MED ORDER — SODIUM CHLORIDE 0.9 % IV SOLN: INTRAVENOUS | Status: DC

## 2021-06-19 MED ORDER — SODIUM CHLORIDE 0.9 % IV SOLN
1.0000 g | INTRAVENOUS | Status: DC
  Administered 2021-06-19: 1 g via INTRAVENOUS
  Filled 2021-06-19: qty 10

## 2021-06-19 MED ORDER — EPINEPHRINE 0.3 MG/0.3ML IJ SOAJ: 0.3000 mg | Freq: Once | INTRAMUSCULAR | Status: DC | PRN

## 2021-06-19 MED ORDER — BEBTELOVIMAB 175 MG/2 ML IV (EUA)
175.0000 mg | Freq: Once | INTRAMUSCULAR | Status: AC
  Administered 2021-06-20: 175 mg via INTRAVENOUS
  Filled 2021-06-19: qty 2

## 2021-06-19 MED ORDER — SODIUM CHLORIDE 0.9 % IV SOLN: INTRAVENOUS | Status: DC | PRN

## 2021-06-19 MED ORDER — FAMOTIDINE IN NACL 20-0.9 MG/50ML-% IV SOLN: 20.0000 mg | Freq: Once | INTRAVENOUS | Status: DC | PRN

## 2021-06-19 MED ORDER — METHYLPREDNISOLONE SODIUM SUCC 125 MG IJ SOLR: 125.0000 mg | Freq: Once | INTRAMUSCULAR | Status: DC | PRN

## 2021-06-19 MED ORDER — CEPHALEXIN 500 MG PO CAPS: 500.0000 mg | ORAL_CAPSULE | Freq: Three times a day (TID) | ORAL | 0 refills | Status: AC

## 2021-06-19 MED ORDER — DIPHENHYDRAMINE HCL 50 MG/ML IJ SOLN: 50.0000 mg | Freq: Once | INTRAMUSCULAR | Status: DC | PRN

## 2021-06-19 MED ORDER — SODIUM CHLORIDE 0.9 % IV BOLUS
1000.0000 mL | Freq: Once | INTRAVENOUS | Status: AC
  Administered 2021-06-19: 1000 mL via INTRAVENOUS

## 2021-06-19 NOTE — Discharge Instructions (Signed)
Your testing shows that you have Covid 19 - we have given you the antibody treatment for this and I would like for you to stay home and quarantine for at least 5 days -  You also have a Urinary infection for which you will need to take the antibiotic called cephalexin -this will help you to fight this infection, take this 3 times daily for 7 days I would like for you to return the emergency department for any worsening breathing vomiting high fevers or changes in your mental status or confusion Otherwise follow-up with your doctor within 1 week for recheck

## 2021-06-19 NOTE — ED Triage Notes (Signed)
Pt brought in by [facility] with c/o AMS per family. Daughter reports she was sitting up eating dinner and when she came back to check on her she was unconscious. EMS was called and pt was alert and answering questions upon their arrival. Initial BP 77/46, HR 60, CBG 225. IV was started and fluid bolus given with last SBP 96. Pt has dementia. EMS reports foul smell of urine at house.

## 2021-06-19 NOTE — ED Notes (Signed)
Please call pt daughter, nicy solo when she is released

## 2021-06-19 NOTE — ED Provider Notes (Signed)
[facility] Provider Note   CSN: [id number] Arrival date & time: 06/19/21  1750     History No chief complaint on file.   Laura Davenport is a 87 y.o. female.  HPI  This patient is an 87 year old female with a history of dementia  Level 5 caveat applies  The patient has a history of hypertension history of gastrointestinal bleeding, history of urinary infections and is a diabetic.  She presents by ambulance transport after they were called out because of an witnessed unresponsive episode while the patient was eating at the table, the family member had difficulty arousing them, the patient was evidently hypotensive, IV fluids were started and her blood pressure improved as well as her mental status.  The family member complained that the patient had a strong smell of urinary tract infection as well.  The patient is not able to tell me anything of any concern, she is smiling happy and interactive.  Past Medical History:  Diagnosis Date   Anemia in chronic kidney disease    B12 deficiency anemia    Chronic renal disease, stage 3, moderately decreased glomerular filtration rate (GFR) between 30-59 mL/min/1.73 square meter (HCC)    Dementia (HCC)    Depression    Essential hypertension    GI bleed    Severe gastritis and duodenal ulcers by EGD May 2016    Hyperlipidemia    PSVT (paroxysmal supraventricular tachycardia) ([place])    Septic arthritis (HCC)    MRSA, left knee    Type 2 diabetes mellitus ([place])     Patient Active Problem List   Diagnosis Date Noted   Sepsis due to gram-negative UTI ([place]) 02/11/2019   Acute cystitis without hematuria    Generalized weakness    Sepsis secondary to UTI ([place]) 02/09/2019   Acute renal failure superimposed on stage 3 chronic kidney disease ([place]) 02/09/2019   Chest pain on exertion 02/01/2019   Dementia ([place]) 02/01/2019   GERD (gastroesophageal reflux disease) 12/21/2018   Constipation 12/21/2018   Altered mental  status 03/24/2017   Anemia in chronic kidney disease 01/20/2016   Chronic renal disease, stage 3, moderately decreased glomerular filtration rate (GFR) between 30-59 mL/min/1.73 square meter ([place]) 01/07/2016   B12 deficiency anemia 10/25/2015   Dysphagia 03/13/2015   AP (abdominal pain)    Hematemesis without nausea    Atrial fibrillation and flutter ([place])    AKI (acute kidney injury) ([place])    Bacteremia    Essential hypertension    Septic joint of left knee joint ([place])    Blood poisoning    Fever 12/22/2014   History of fractured kneecap 12/22/2014   Knee fracture, left 12/22/2014   Sepsis ([place]) 12/22/2014   Tachycardia with 141 - 160 beats per minute 12/22/2014   Diabetes mellitus without complication ([place])    Hypertension    HLD (hyperlipidemia)    Depression     Past Surgical History:  Procedure Laterality Date   CHOLECYSTECTOMY N/A 11/04/2012   Procedure: LAPAROSCOPIC CHOLECYSTECTOMY;  Surgeon: Jamesetta So, MD;  Location: [facility];  Service: General;  Laterality: N/A;   ESOPHAGOGASTRODUODENOSCOPY N/A 01/06/2015   SLF: mild esophagitis   I & D EXTREMITY Left 12/24/2014   Procedure: IRRIGATION AND DEBRIDEMENT EXTREMITY AND ARTHROSCOPY KNEE;  Surgeon: Renette Butters, MD;  Location: [facility];  Service: Orthopedics;  Laterality: Left;   KNEE SURGERY     SAVORY DILATION  01/06/2015   Procedure: SAVORY DILATION;  Surgeon: Danie Binder,  MD;  Location: [facility];  Service: Endoscopy;;     OB History     Gravida  10   Para  10   Term  10   Preterm      AB      Living  9      SAB      IAB      Ectopic      Multiple      Live Births              Family History  Family history unknown: Yes    Social History   Tobacco Use   Smoking status: Never   Smokeless tobacco: Never   Tobacco comments:    Never smoked  Substance Use Topics   Alcohol use: No    Alcohol/week: 0.0 standard drinks   Drug use: No    Home Medications Prior to Admission  medications   Medication Sig Start Date End Date Taking? Authorizing Provider  cephALEXin (KEFLEX) 500 MG capsule Take 1 capsule (500 mg total) by mouth 3 (three) times daily for 7 days. 06/19/21 06/26/21 Yes Noemi Chapel, MD  aspirin EC 81 MG EC tablet Take 1 tablet (81 mg total) by mouth daily. 02/03/19   Johnson, Clanford L, MD  CARTIA XT 240 MG 24 hr capsule Take 240 mg by mouth daily.  11/24/18   [provider]  donepezil (ARICEPT) 10 MG tablet Take 10 mg by mouth at bedtime.  05/07/18   [provider]  losartan (COZAAR) 100 MG tablet Take 100 mg by mouth daily.  05/03/19   [provider]  pantoprazole (PROTONIX) 40 MG tablet TAKE 1 TABLET(40 MG) BY MOUTH DAILY BEFORE BREAKFAST 02/15/21   [name], NP  vitamin B-12 (CYANOCOBALAMIN) 250 MCG tablet Take 250 mcg by mouth daily.    [provider]    Allergies    Oxycodone  Review of Systems   Review of Systems  Unable to perform ROS: Dementia   Physical Exam Updated Vital Signs BP (!) 121/94   Pulse 77   Temp 98.1 F (36.7 C) (Oral)   Resp 17   SpO2 100%   Physical Exam Vitals and nursing note reviewed.  Constitutional:      General: She is not in acute distress.    Appearance: She is well-developed.  HENT:     Head: Normocephalic and atraumatic.     Mouth/Throat:     Pharynx: No oropharyngeal exudate.  Eyes:     General: No scleral icterus.       Right eye: No discharge.        Left eye: No discharge.     Conjunctiva/sclera: Conjunctivae normal.     Pupils: Pupils are equal, round, and reactive to light.  Neck:     Thyroid: No thyromegaly.     Vascular: No JVD.  Cardiovascular:     Rate and Rhythm: Normal rate and regular rhythm.     Heart sounds: Normal heart sounds. No murmur heard.   No friction rub. No gallop.  Pulmonary:     Effort: Pulmonary effort is normal. No respiratory distress.     Breath sounds: Normal breath sounds. No wheezing or rales.  Abdominal:      General: Bowel sounds are normal. There is no distension.     Palpations: Abdomen is soft. There is no mass.     Tenderness: There is no abdominal tenderness.  Musculoskeletal:  General: No tenderness. Normal range of motion.     Cervical back: Normal range of motion and neck supple.  Lymphadenopathy:     Cervical: No cervical adenopathy.  Skin:    General: Skin is warm and dry.     Findings: No erythema or rash.  Neurological:     Mental Status: She is alert.     Coordination: Coordination normal.     Comments: The patient is awake alert and able to follow commands, she has normal strength in bilateral arms and legs, she follows commands, she is able to speak without slurring her words and there is no facial droop  Psychiatric:        Behavior: Behavior normal.    ED Results / Procedures / Treatments   Labs (all labs ordered are listed, but only abnormal results are displayed) Labs Reviewed  RESP PANEL BY RT-PCR (FLU A&B, COVID) ARPGX2 - Abnormal; Notable for the following components:      Result Value   SARS Coronavirus 2 by RT PCR POSITIVE (*)    All other components within normal limits  LACTIC ACID, PLASMA - Abnormal; Notable for the following components:   Lactic Acid, Venous 2.7 (*)    All other components within normal limits  COMPREHENSIVE METABOLIC PANEL - Abnormal; Notable for the following components:   Chloride 113 (*)    Glucose, Bld 131 (*)    BUN 24 (*)    Creatinine, Ser 1.63 (*)    Calcium 7.9 (*)    Total Protein 6.2 (*)    Albumin 2.9 (*)    GFR, Estimated 30 (*)    All other components within normal limits  CBC WITH DIFFERENTIAL/PLATELET - Abnormal; Notable for the following components:   RBC 3.16 (*)    Hemoglobin 10.6 (*)    HCT 32.7 (*)    MCV 103.5 (*)    All other components within normal limits  URINALYSIS, ROUTINE W REFLEX MICROSCOPIC - Abnormal; Notable for the following components:   APPearance TURBID (*)    Hgb urine dipstick  MODERATE (*)    Protein, ur 100 (*)    Leukocytes,Ua MODERATE (*)    WBC, UA >50 (*)    Bacteria, UA MANY (*)    Non Squamous Epithelial 0-5 (*)    All other components within normal limits  CULTURE, BLOOD (ROUTINE X 2)  CULTURE, BLOOD (ROUTINE X 2)  URINE CULTURE  LACTIC ACID, PLASMA  PROTIME-INR  APTT  TROPONIN I (HIGH SENSITIVITY)    EKG EKG Interpretation  Date/Time:  Tuesday June 19 2021 18:36:52 EDT Ventricular Rate:  69 PR Interval:  224 QRS Duration: 89 QT Interval:  430 QTC Calculation: 454 R Axis:   2 Text Interpretation: Sinus rhythm Atrial premature complex Low voltage, precordial leads Abnormal R-wave progression, early transition Baseline wander in lead(s) V6 Confirmed by Noemi Chapel [phone]) on 06/19/2021 7:25:29 PM  Radiology DG Chest Port 1 View  Result Date: 06/19/2021 CLINICAL DATA:  Possible sepsis EXAM: PORTABLE CHEST 1 VIEW COMPARISON:  02/09/2019 chest x-ray 03/24/2017 FINDINGS: The heart size and mediastinal contours are within normal limits. Aortic atherosclerosis. Both lungs are clear. Chronic fracture deformity at the right humeral neck. IMPRESSION: No active disease. Electronically Signed   By: Donavan Foil M.D.   On: 06/19/2021 19:05    Procedures Procedures   Medications Ordered in ED Medications  0.9 %  sodium chloride infusion ( Intravenous New Bag/Given 06/19/21 1904)  cefTRIAXone (ROCEPHIN) 1 g in sodium chloride  0.9 % 100 mL IVPB (1 g Intravenous New Bag/Given 06/19/21 1907)  0.9 %  sodium chloride infusion (has no administration in time range)  bebtelovimab EUA injection SOLN 175 mg (has no administration in time range)  diphenhydrAMINE (BENADRYL) injection 50 mg (has no administration in time range)  famotidine (PEPCID) IVPB 20 mg premix (has no administration in time range)  methylPREDNISolone sodium succinate (SOLU-MEDROL) 125 mg/2 mL injection 125 mg (has no administration in time range)  albuterol (VENTOLIN HFA) 108 (90 Base)  MCG/ACT inhaler 2 puff (has no administration in time range)  EPINEPHrine (EPI-PEN) injection 0.3 mg (has no administration in time range)  sodium chloride 0.9 % bolus 1,000 mL (1,000 mLs Intravenous New Bag/Given 06/19/21 2032)    ED Course  I have reviewed the triage vital signs and the nursing notes.  Pertinent labs & imaging results that were available during my care of the patient were reviewed by me and considered in my medical decision making (see chart for details).  Clinical Course as of 06/19/21 2307  Tue Jun 19, 2021  [id number] Review of laboratory data shows that the patient's creatinine is at baseline, she has a slightly low calcium total protein albumin and a slightly elevated chloride.  Lactic acid is 2.7 [BM]  1957 She has a blood count of 4.1, no leukocytosis or leukopenia, mild anemia [BM]  1957 Anemia is close to baseline, INR is normal, troponin is normal [BM]  1957 The patient has a blood pressure of 99/62 with a heart rate of 66, still awake and alert [BM]  2056 No leukocytosis, COVID-positive, chest x-ray shows no infiltrate [BM]  2056 The patient has been awake and alert, her blood pressure is 97/80 up to 101/78, she does not appear to be hypotensive and her lactic acid was 1.4 on a recheck.  At this time the patient does appear stable, will discuss with family [BM]  2259 Most recent blood pressure is 121/94, heart rate of 77, patient is very stable and has received antibiotics and IV fluids.  Stable for [BM]    Clinical Course User Index [BM] Noemi Chapel, MD   MDM Rules/Calculators/A&P                           The patient has palpable pulses, she is not tachycardic, she has no edema, she does not have any focal findings.  She will need EKG and labs including urinalysis to make sure there is no signs of underlying infection or cardiac dysfunction or arrhythmia.  The patient was given the covid monoclonal antibody prior to d/c, she was given Rocephin and discharged  with cephalexin.  Vital signs remain normal, stable for discharge  Laura Davenport was evaluated in Emergency Department on 06/19/2021 for the symptoms described in the history of present illness. She was evaluated in the context of the global COVID-19 pandemic, which necessitated consideration that the patient might be at risk for infection with the SARS-CoV-2 virus that causes COVID-19. Institutional protocols and algorithms that pertain to the evaluation of patients at risk for COVID-19 are in a state of rapid change based on information released by regulatory bodies including the CDC and federal and state organizations. These policies and algorithms were followed during the patient's care in the ED.   Final Clinical Impression(s) / ED Diagnoses Final diagnoses:  [id number]  Urinary tract infection without hematuria, site unspecified    Rx / DC Orders ED Discharge Orders  Ordered    cephALEXin (KEFLEX) 500 MG capsule  3 times daily        06/19/21 2305             Noemi Chapel, MD 06/19/21 2307

## 2021-06-20 DIAGNOSIS — U071 COVID-19: Secondary | ICD-10-CM | POA: Diagnosis not present

## 2021-06-21 LAB — URINE CULTURE

## 2021-06-24 LAB — CULTURE, BLOOD (ROUTINE X 2)
Culture: NO GROWTH
Culture: NO GROWTH
Special Requests: ADEQUATE

## 2021-07-01 ENCOUNTER — Other Ambulatory Visit: Payer: Self-pay

## 2021-07-01 ENCOUNTER — Encounter (HOSPITAL_COMMUNITY): Payer: Self-pay

## 2021-07-01 ENCOUNTER — Inpatient Hospital Stay (HOSPITAL_COMMUNITY)
Admission: EM | Admit: 2021-07-01 | Discharge: 2021-07-06 | DRG: 193 | Disposition: A | Discharge status: Home Health | Payer: Medicare Other | Attending: Internal Medicine | Admitting: Internal Medicine

## 2021-07-01 ENCOUNTER — Emergency Department (HOSPITAL_COMMUNITY): Payer: Medicare Other

## 2021-07-01 DIAGNOSIS — D631 Anemia in chronic kidney disease: Secondary | ICD-10-CM | POA: Diagnosis present

## 2021-07-01 DIAGNOSIS — I129 Hypertensive chronic kidney disease with stage 1 through stage 4 chronic kidney disease, or unspecified chronic kidney disease: Secondary | ICD-10-CM | POA: Diagnosis present

## 2021-07-01 DIAGNOSIS — Z8616 Personal history of COVID-19: Secondary | ICD-10-CM | POA: Diagnosis not present

## 2021-07-01 DIAGNOSIS — E871 Hypo-osmolality and hyponatremia: Secondary | ICD-10-CM | POA: Diagnosis not present

## 2021-07-01 DIAGNOSIS — N183 Chronic kidney disease, stage 3 unspecified: Secondary | ICD-10-CM | POA: Diagnosis not present

## 2021-07-01 DIAGNOSIS — Z28310 Unvaccinated for covid-19: Secondary | ICD-10-CM | POA: Diagnosis not present

## 2021-07-01 DIAGNOSIS — Z885 Allergy status to narcotic agent status: Secondary | ICD-10-CM

## 2021-07-01 DIAGNOSIS — I4892 Unspecified atrial flutter: Secondary | ICD-10-CM | POA: Diagnosis not present

## 2021-07-01 DIAGNOSIS — I10 Essential (primary) hypertension: Secondary | ICD-10-CM | POA: Diagnosis not present

## 2021-07-01 DIAGNOSIS — I959 Hypotension, unspecified: Secondary | ICD-10-CM | POA: Diagnosis not present

## 2021-07-01 DIAGNOSIS — F0390 Unspecified dementia without behavioral disturbance: Secondary | ICD-10-CM | POA: Diagnosis present

## 2021-07-01 DIAGNOSIS — J159 Unspecified bacterial pneumonia: Secondary | ICD-10-CM | POA: Diagnosis present

## 2021-07-01 DIAGNOSIS — R000 Tachycardia, unspecified: Secondary | ICD-10-CM | POA: Diagnosis not present

## 2021-07-01 DIAGNOSIS — N1832 Chronic kidney disease, stage 3b: Secondary | ICD-10-CM | POA: Diagnosis present

## 2021-07-01 DIAGNOSIS — N390 Urinary tract infection, site not specified: Secondary | ICD-10-CM | POA: Diagnosis not present

## 2021-07-01 DIAGNOSIS — Z7189 Other specified counseling: Secondary | ICD-10-CM | POA: Diagnosis not present

## 2021-07-01 DIAGNOSIS — E872 Acidosis, unspecified: Secondary | ICD-10-CM | POA: Diagnosis present

## 2021-07-01 DIAGNOSIS — E1122 Type 2 diabetes mellitus with diabetic chronic kidney disease: Secondary | ICD-10-CM | POA: Diagnosis present

## 2021-07-01 DIAGNOSIS — I471 Supraventricular tachycardia: Secondary | ICD-10-CM | POA: Diagnosis present

## 2021-07-01 DIAGNOSIS — Z79899 Other long term (current) drug therapy: Secondary | ICD-10-CM | POA: Diagnosis not present

## 2021-07-01 DIAGNOSIS — I4891 Unspecified atrial fibrillation: Secondary | ICD-10-CM | POA: Diagnosis present

## 2021-07-01 DIAGNOSIS — Z7982 Long term (current) use of aspirin: Secondary | ICD-10-CM

## 2021-07-01 DIAGNOSIS — J1282 Pneumonia due to coronavirus disease 2019: Secondary | ICD-10-CM | POA: Diagnosis not present

## 2021-07-01 DIAGNOSIS — Z993 Dependence on wheelchair: Secondary | ICD-10-CM | POA: Diagnosis not present

## 2021-07-01 DIAGNOSIS — J181 Lobar pneumonia, unspecified organism: Principal | ICD-10-CM

## 2021-07-01 DIAGNOSIS — Z515 Encounter for palliative care: Secondary | ICD-10-CM | POA: Diagnosis not present

## 2021-07-01 DIAGNOSIS — N179 Acute kidney failure, unspecified: Secondary | ICD-10-CM | POA: Diagnosis present

## 2021-07-01 DIAGNOSIS — U071 COVID-19: Secondary | ICD-10-CM

## 2021-07-01 DIAGNOSIS — G9341 Metabolic encephalopathy: Secondary | ICD-10-CM | POA: Diagnosis not present

## 2021-07-01 DIAGNOSIS — Z66 Do not resuscitate: Secondary | ICD-10-CM | POA: Diagnosis not present

## 2021-07-01 DIAGNOSIS — F0280 Dementia in other diseases classified elsewhere without behavioral disturbance: Secondary | ICD-10-CM | POA: Diagnosis not present

## 2021-07-01 DIAGNOSIS — N171 Acute kidney failure with acute cortical necrosis: Secondary | ICD-10-CM | POA: Diagnosis not present

## 2021-07-01 DIAGNOSIS — E785 Hyperlipidemia, unspecified: Secondary | ICD-10-CM | POA: Diagnosis present

## 2021-07-01 DIAGNOSIS — R6889 Other general symptoms and signs: Secondary | ICD-10-CM | POA: Diagnosis not present

## 2021-07-01 DIAGNOSIS — E860 Dehydration: Secondary | ICD-10-CM | POA: Diagnosis not present

## 2021-07-01 DIAGNOSIS — Z743 Need for continuous supervision: Secondary | ICD-10-CM | POA: Diagnosis not present

## 2021-07-01 DIAGNOSIS — R410 Disorientation, unspecified: Secondary | ICD-10-CM | POA: Diagnosis not present

## 2021-07-01 DIAGNOSIS — E119 Type 2 diabetes mellitus without complications: Secondary | ICD-10-CM | POA: Diagnosis not present

## 2021-07-01 LAB — LACTIC ACID, PLASMA
Lactic Acid, Venous: 2.5 mmol/L (ref 0.5–1.9)
Lactic Acid, Venous: 1.6 mmol/L (ref 0.5–1.9)

## 2021-07-01 LAB — CBC WITH DIFFERENTIAL/PLATELET
Abs Immature Granulocytes: 0.05 10*3/uL (ref 0.00–0.07)
Basophils Absolute: 0 10*3/uL (ref 0.0–0.1)
Basophils Relative: 0 %
Eosinophils Absolute: 0 10*3/uL (ref 0.0–0.5)
Eosinophils Relative: 0 %
HCT: 30 % — ABNORMAL LOW (ref 36.0–46.0)
Hemoglobin: 10.4 g/dL — ABNORMAL LOW (ref 12.0–15.0)
Immature Granulocytes: 1 %
Lymphocytes Relative: 11 %
Lymphs Abs: 0.8 10*3/uL (ref 0.7–4.0)
MCH: 33.8 pg (ref 26.0–34.0)
MCHC: 34.7 g/dL (ref 30.0–36.0)
MCV: 97.4 fL (ref 80.0–100.0)
Monocytes Absolute: 0.3 10*3/uL (ref 0.1–1.0)
Monocytes Relative: 5 %
Neutro Abs: 6.2 10*3/uL (ref 1.7–7.7)
Neutrophils Relative %: 83 %
Platelets: 195 10*3/uL (ref 150–400)
RBC: 3.08 MIL/uL — ABNORMAL LOW (ref 3.87–5.11)
RDW: 13.3 % (ref 11.5–15.5)
WBC: 7.4 10*3/uL (ref 4.0–10.5)
nRBC: 0 % (ref 0.0–0.2)

## 2021-07-01 LAB — URINALYSIS, ROUTINE W REFLEX MICROSCOPIC
Bilirubin Urine: NEGATIVE
Glucose, UA: NEGATIVE mg/dL
Ketones, ur: NEGATIVE mg/dL
Nitrite: NEGATIVE
Protein, ur: 30 mg/dL — AB
Specific Gravity, Urine: 1.009 (ref 1.005–1.030)
WBC, UA: >50 WBC/hpf — ABNORMAL HIGH (ref 0–5)
pH: 8 (ref 5.0–8.0)

## 2021-07-01 LAB — RESP PANEL BY RT-PCR (FLU A&B, COVID) ARPGX2
Influenza A by PCR: NEGATIVE
Influenza B by PCR: NEGATIVE
SARS Coronavirus 2 by RT PCR: POSITIVE — AB

## 2021-07-01 LAB — D-DIMER, QUANTITATIVE: D-Dimer, Quant: >20 ug/mL-FEU — ABNORMAL HIGH (ref 0.00–0.50)

## 2021-07-01 LAB — COMPREHENSIVE METABOLIC PANEL
ALT: 22 U/L (ref 0–44)
AST: 44 U/L — ABNORMAL HIGH (ref 15–41)
Albumin: 2.4 g/dL — ABNORMAL LOW (ref 3.5–5.0)
Alkaline Phosphatase: 58 U/L (ref 38–126)
Anion gap: 10 (ref 5–15)
BUN: 49 mg/dL — ABNORMAL HIGH (ref 8–23)
CO2: 24 mmol/L (ref 22–32)
Calcium: 8.2 mg/dL — ABNORMAL LOW (ref 8.9–10.3)
Chloride: 100 mmol/L (ref 98–111)
Creatinine, Ser: 2 mg/dL — ABNORMAL HIGH (ref 0.44–1.00)
GFR, Estimated: 24 mL/min — ABNORMAL LOW (ref >60)
Glucose, Bld: 149 mg/dL — ABNORMAL HIGH (ref 70–99)
Potassium: 4.7 mmol/L (ref 3.5–5.1)
Sodium: 134 mmol/L — ABNORMAL LOW (ref 135–145)
Total Bilirubin: 0.8 mg/dL (ref 0.3–1.2)
Total Protein: 6.2 g/dL — ABNORMAL LOW (ref 6.5–8.1)

## 2021-07-01 LAB — C-REACTIVE PROTEIN: CRP: 16.1 mg/dL — ABNORMAL HIGH (ref <1.0)

## 2021-07-01 LAB — PROCALCITONIN: Procalcitonin: 1.51 ng/mL

## 2021-07-01 LAB — LACTATE DEHYDROGENASE: LDH: 296 U/L — ABNORMAL HIGH (ref 98–192)

## 2021-07-01 LAB — FIBRINOGEN: Fibrinogen: 338 mg/dL (ref 210–475)

## 2021-07-01 LAB — PROTIME-INR
INR: 1.1 (ref 0.8–1.2)
Prothrombin Time: 14.2 seconds (ref 11.4–15.2)

## 2021-07-01 LAB — APTT: aPTT: 20 seconds — ABNORMAL LOW (ref 24–36)

## 2021-07-01 LAB — FERRITIN: Ferritin: 635 ng/mL — ABNORMAL HIGH (ref 11–307)

## 2021-07-01 MED ORDER — HEPARIN SODIUM (PORCINE) 5000 UNIT/ML IJ SOLN
5000.0000 [IU] | Freq: Three times a day (TID) | INTRAMUSCULAR | Status: DC
  Administered 2021-07-02 – 2021-07-06 (×14): 5000 [IU] via SUBCUTANEOUS
  Filled 2021-07-01 (×14): qty 1

## 2021-07-01 MED ORDER — IPRATROPIUM-ALBUTEROL 0.5-2.5 (3) MG/3ML IN SOLN: 3.0000 mL | Freq: Four times a day (QID) | RESPIRATORY_TRACT | Status: DC | PRN

## 2021-07-01 MED ORDER — ALBUTEROL SULFATE HFA 108 (90 BASE) MCG/ACT IN AERS
2.0000 | INHALATION_SPRAY | Freq: Four times a day (QID) | RESPIRATORY_TRACT | Status: DC
  Administered 2021-07-01: 2 via RESPIRATORY_TRACT
  Filled 2021-07-01: qty 6.7

## 2021-07-01 MED ORDER — AEROCHAMBER Z-STAT PLUS/MEDIUM MISC
Status: AC
  Filled 2021-07-01: qty 1

## 2021-07-01 MED ORDER — SODIUM CHLORIDE 0.9 % IV BOLUS
2000.0000 mL | Freq: Once | INTRAVENOUS | Status: AC
  Administered 2021-07-01: 2000 mL via INTRAVENOUS

## 2021-07-01 MED ORDER — ZINC SULFATE 220 (50 ZN) MG PO CAPS
220.0000 mg | ORAL_CAPSULE | Freq: Every day | ORAL | Status: DC
  Administered 2021-07-03 – 2021-07-05 (×3): 220 mg via ORAL
  Filled 2021-07-01 (×5): qty 1

## 2021-07-01 MED ORDER — PREDNISONE 20 MG PO TABS
50.0000 mg | ORAL_TABLET | Freq: Every day | ORAL | Status: DC
  Administered 2021-07-05: 50 mg via ORAL
  Filled 2021-07-01 (×2): qty 1

## 2021-07-01 MED ORDER — ALBUTEROL SULFATE HFA 108 (90 BASE) MCG/ACT IN AERS: 2.0000 | INHALATION_SPRAY | RESPIRATORY_TRACT | Status: DC | PRN

## 2021-07-01 MED ORDER — METHYLPREDNISOLONE SODIUM SUCC 40 MG IJ SOLR
0.5000 mg/kg | Freq: Two times a day (BID) | INTRAMUSCULAR | Status: AC
  Administered 2021-07-02 – 2021-07-04 (×6): 27.6 mg via INTRAVENOUS
  Filled 2021-07-01 (×6): qty 1

## 2021-07-01 MED ORDER — SODIUM CHLORIDE 0.9 % IV SOLN
500.0000 mg | INTRAVENOUS | Status: DC
  Administered 2021-07-01: 500 mg via INTRAVENOUS
  Filled 2021-07-01: qty 500

## 2021-07-01 MED ORDER — DONEPEZIL HCL 5 MG PO TABS
10.0000 mg | ORAL_TABLET | Freq: Every day | ORAL | Status: DC
  Administered 2021-07-02 – 2021-07-05 (×4): 10 mg via ORAL
  Filled 2021-07-01 (×4): qty 2

## 2021-07-01 MED ORDER — LACTATED RINGERS IV SOLN: INTRAVENOUS | Status: DC

## 2021-07-01 MED ORDER — PROMETHAZINE HCL 12.5 MG PO TABS: 12.5000 mg | ORAL_TABLET | Freq: Four times a day (QID) | ORAL | Status: DC | PRN

## 2021-07-01 MED ORDER — VANCOMYCIN HCL IN DEXTROSE 1-5 GM/200ML-% IV SOLN
1000.0000 mg | Freq: Once | INTRAVENOUS | Status: AC
  Administered 2021-07-01: 1000 mg via INTRAVENOUS
  Filled 2021-07-01: qty 200

## 2021-07-01 MED ORDER — ASPIRIN EC 81 MG PO TBEC
81.0000 mg | DELAYED_RELEASE_TABLET | Freq: Every day | ORAL | Status: DC
  Administered 2021-07-02 – 2021-07-05 (×4): 81 mg via ORAL
  Filled 2021-07-01 (×5): qty 1

## 2021-07-01 MED ORDER — ACETAMINOPHEN 325 MG PO TABS
650.0000 mg | ORAL_TABLET | Freq: Four times a day (QID) | ORAL | Status: DC | PRN
  Administered 2021-07-02: 650 mg via ORAL
  Filled 2021-07-01: qty 2

## 2021-07-01 MED ORDER — PANTOPRAZOLE SODIUM 40 MG PO TBEC
40.0000 mg | DELAYED_RELEASE_TABLET | Freq: Every day | ORAL | Status: DC
  Administered 2021-07-02 – 2021-07-05 (×4): 40 mg via ORAL
  Filled 2021-07-01 (×5): qty 1

## 2021-07-01 MED ORDER — POLYETHYLENE GLYCOL 3350 17 G PO PACK: 17.0000 g | PACK | Freq: Every day | ORAL | Status: DC | PRN

## 2021-07-01 MED ORDER — SODIUM CHLORIDE 0.9 % IV SOLN: INTRAVENOUS | Status: AC

## 2021-07-01 MED ORDER — SODIUM CHLORIDE 0.9 % IV SOLN
2.0000 g | INTRAVENOUS | Status: DC
  Administered 2021-07-01: 2 g via INTRAVENOUS
  Filled 2021-07-01: qty 20

## 2021-07-01 MED ORDER — GUAIFENESIN-DM 100-10 MG/5ML PO SYRP: 10.0000 mL | ORAL_SOLUTION | ORAL | Status: DC | PRN

## 2021-07-01 MED ORDER — ASCORBIC ACID 500 MG PO TABS
500.0000 mg | ORAL_TABLET | Freq: Every day | ORAL | Status: DC
  Administered 2021-07-02 – 2021-07-05 (×4): 500 mg via ORAL
  Filled 2021-07-01 (×5): qty 1

## 2021-07-01 MED ORDER — METHYLPREDNISOLONE SODIUM SUCC 125 MG IJ SOLR
125.0000 mg | Freq: Once | INTRAMUSCULAR | Status: AC
  Administered 2021-07-01: 125 mg via INTRAVENOUS
  Filled 2021-07-01: qty 2

## 2021-07-01 NOTE — H&P (Addendum)
History and Physical    Laura Davenport:[id number] DOB: 05/26/1931 DOA: 07/01/2021  PCP: [name], MD   Patient coming from: Home  I have personally briefly reviewed patient's old medical records in [place]  Chief Complaint: Poor oral intake  HPI: Laura Davenport is a 87 y.o. female with medical history significant for CKD 3, atrial fibrillation, dementia, diabetes mellitus, hypertension. Patient was brought to the ED via EMS reports of altered mental status, also reported patient tested positive for COVID on the 19th of this month.  And since positive COVID test patient has not been doing well.  On the time of my evaluation, patient is alone in the room, she is talking but it is incomprehensible.  Spoke to patient's daughter on the phone- Artie Takayama, who is patient's primary decision maker.  She reports at baseline, patient most times can feed herself, mostly recognizes family, ambulates with a wheelchair, on several occasions her speech would not make sense, but she is able to answer simple questions. Daughter reports over the past week, patient has barely eaten or drunk anything, was not as alert as usual.  Patient has had a mild cough.  No difficulty breathing.  Patient did not receive any of the COVID vaccines, and no known prior COVID-19 infection.  ED Course: Temperature 97.6.  Heart rate 70s to 90s.  Respiratory rate 17-21.  Blood pressure systolic down to 83/38 improved after fluid boluses currently 104/62.  Sats greater than 94% on room air. Port Chest x-ray shows new patchy ill-defined opacities overlying the mid and upper right lung suspicious for airspace disease/pneumonia. Pending.  Lactic acid 2.5 > 1.6 after 2 L fluid bolus.  WBC 7.4. IV ceftriaxone azithromycin and vancomycin given for pneumonia.  Hospitalist to admit.  Review of Systems: Unable to ascertain due to altered mental status, baseline dementia.  Past Medical History:  Diagnosis Date    Anemia in chronic kidney disease    B12 deficiency anemia    Chronic renal disease, stage 3, moderately decreased glomerular filtration rate (GFR) between 30-59 mL/min/1.73 square meter (HCC)    Dementia (HCC)    Depression    Essential hypertension    GI bleed    Severe gastritis and duodenal ulcers by EGD May 2016    Hyperlipidemia    PSVT (paroxysmal supraventricular tachycardia) ([place])    Septic arthritis ([place])    MRSA, left knee    Type 2 diabetes mellitus ([place])     Past Surgical History:  Procedure Laterality Date   CHOLECYSTECTOMY N/A 11/04/2012   Procedure: LAPAROSCOPIC CHOLECYSTECTOMY;  Surgeon: Jamesetta So, MD;  Location: [facility];  Service: General;  Laterality: N/A;   ESOPHAGOGASTRODUODENOSCOPY N/A 01/06/2015   SLF: mild esophagitis   I & D EXTREMITY Left 12/24/2014   Procedure: IRRIGATION AND DEBRIDEMENT EXTREMITY AND ARTHROSCOPY KNEE;  Surgeon: Renette Butters, MD;  Location: [facility];  Service: Orthopedics;  Laterality: Left;   KNEE SURGERY     SAVORY DILATION  01/06/2015   Procedure: SAVORY DILATION;  Surgeon: Danie Binder, MD;  [facility];  Service: Endoscopy;;     reports that she has never smoked. She has never used smokeless tobacco. She reports that she does not drink alcohol and does not use drugs.  Allergies  Allergen Reactions   Oxycodone Hives    Family History  Family history unknown: Yes   Prior to Admission medications   Medication Sig Start Date End Date Taking? Authorizing  Provider  acetaminophen (TYLENOL) 500 MG tablet Take 500 mg by mouth every 6 (six) hours as needed.   Yes [provider]  aspirin EC 81 MG EC tablet Take 1 tablet (81 mg total) by mouth daily. 02/03/19  Yes Johnson, Clanford L, MD  CARTIA XT 240 MG 24 hr capsule Take 240 mg by mouth daily.  11/24/18  Yes [provider]  donepezil (ARICEPT) 10 MG tablet Take 10 mg by mouth at bedtime.  05/07/18  Yes [provider]  losartan (COZAAR) 100 MG  tablet Take 100 mg by mouth daily.  05/03/19  Yes [provider]  pantoprazole (PROTONIX) 40 MG tablet TAKE 1 TABLET(40 MG) BY MOUTH DAILY BEFORE BREAKFAST 02/15/21  Yes [name], NP  vitamin B-12 (CYANOCOBALAMIN) 250 MCG tablet Take 250 mcg by mouth daily.   Yes [provider]    Physical Exam: Limited exam due to baseline dementia  Vitals:   07/01/21 1745 07/01/21 1800 07/01/21 1805 07/01/21 1810  BP: (!) 84/53 (!) 97/56 111/65 103/60  Pulse: 79 76  83  Resp: 19 18 17 17   Temp:      TempSrc:      SpO2: 100% 99% 100% 100%  Weight:      Height:        Constitutional: Awake , slightly lethargic, incomprehensible speech Vitals:   07/01/21 1745 07/01/21 1800 07/01/21 1805 07/01/21 1810  BP: (!) 84/53 (!) 97/56 111/65 103/60  Pulse: 79 76  83  Resp: 19 18 17 17   Temp:      TempSrc:      SpO2: 100% 99% 100% 100%  Weight:      Height:       Eyes: PERRL, lids and conjunctivae normal ENMT: Mucous membranes are moist. Neck: normal, supple, no masses, no thyromegaly Respiratory:  Normal respiratory effort. No accessory muscle use.  Cardiovascular: Regular rate and rhythm,  No extremity edema. 2+ pedal pulses.   Abdomen: no tenderness, no masses palpated. No hepatosplenomegaly. Bowel sounds positive.  Musculoskeletal: no clubbing / cyanosis. No joint deformity upper and lower extremities. Normal muscle tone.  Skin: no rashes, lesions, ulcers. No induration Neurologic: No facial asymmetry, able to verbalize, but speech is incomprehensible, moving all extremities spontaneously.  Psychiatric: Awake, slightly lethargic  Labs on Admission: I have personally reviewed following labs and imaging studies  CBC: Recent Labs  Lab 07/01/21 1716  WBC 7.4  NEUTROABS 6.2  HGB 10.4*  HCT 30.0*  MCV 97.4  PLT 315   Basic Metabolic Panel: Recent Labs  Lab 07/01/21 1716  NA 134*  K 4.7  CL 100  CO2 24  GLUCOSE 149*  BUN 49*  CREATININE 2.00*  CALCIUM 8.2*    Liver Function Tests: Recent Labs  Lab 07/01/21 1716  AST 44*  ALT 22  ALKPHOS 58  BILITOT 0.8  PROT 6.2*  ALBUMIN 2.4*   Coagulation Profile: Recent Labs  Lab 07/01/21 1716  INR 1.1    Radiological Exams on Admission: DG Chest Port 1 View  Result Date: 07/01/2021 CLINICAL DATA:  Altered mental status. EXAM: PORTABLE CHEST 1 VIEW COMPARISON:  06/19/2021 FINDINGS: New patchy ill-defined opacities overlying the mid and UPPER LATERAL RIGHT lung noted. The cardiomediastinal silhouette is unchanged. No pleural effusion or pneumothorax. Mild chronic LEFT lung opacities are again identified. No acute bony abnormalities are noted. IMPRESSION: New patchy ill-defined opacities overlying the mid and UPPER RIGHT lung suspicious for airspace disease/pneumonia. Electronically Signed   By: Dellis Filbert  Hu M.D.   On: 07/01/2021 17:38    EKG: Independently reviewed.  Sinus rhythm rate 82, QTc 495.  Assessment/Plan Principal Problem:   Pneumonia due to COVID-19 virus Active Problems:   Essential hypertension   Atrial fibrillation and flutter (HCC)   Dementia (HCC)   Acute renal failure superimposed on stage 3 chronic kidney disease (HCC)   COVID pneumonia- cough, no dyspnea, afebrile, O2 sats greater than 94% on room air.  Chest x-ray shows new patchy ill-defined opacities overlying the mid and upper right lung suspicious for airspace disease and pneumonia. Tested positive for covid on the 18th of this month. -Did not receive any of the COVID vaccines, no prior known COVID infection -Will hold off on further antibiotics for now -Check procalcitonin -Obtain and trend inflammatory markers -IV Solu-Medrol 125x1, continue weight-based dosing of Solu-Medrol -Mucolytic's, multivitamins, -Patient is outside window for benefit from remdesivir, not a candidate for paxlovid at this point -Follow-up blood cultures obtained in ED  AKI on CKD 3b-creatinine 2, baseline 1.4-1.7.  Likely prerenal from  poor oral intake, in the setting of losartan use -Hold losartan - 2 L sepsis fluid bolus given, continue N/s 75cc/hr x 1 day  Hypotension, lactic acidosis- likely due to dehydration.  Patient does not meet sepsis criteria.  Blood pressure down to 84/68, improved after 2 L bolus. Lactic acid- 2.5 > 1.6. -Hydrate   Dementia- mental status does not appear to be significantly different from her baseline.  Appears to have moderately advanced dementia at baseline. -Resume donepezil  Controlled diabetes mellitus-random glucose 149.  A1c 5.3.  Not on medication. -Follow glucose on metabolic panel  Hypertension-blood pressure systolic down to 84. - Hold Losartan, Cartia XR  Chronic anemia hemoglobin 10.4.  Stable.  Follows with Dr. Delton Coombes.  Felt secondary to CKD and relative iron deficiency.  PSVT history follows with Dr. Conni Elliot. On cartia XR.  - Hold Cartia for now  DVT prophylaxis: heparin Code Status:  Full code for now.  Daughter states patient has a living will, but she does not know what patient's wishes are.  She will bring document to the hospital tomorrow Family Communication: None at bedside. Daughter Toshiye Kever on the phone- patients primary decision maker.  Disposition Plan: ~ 2 days Consults called: None Admission status: Inpt, Tele I certify that at the point of admission it is my clinical judgment that the patient will require inpatient hospital care spanning beyond 2 midnights from the point of admission due to high intensity of service, high risk for further deterioration and high frequency of surveillance required.   Bethena Roys MD [facility]  07/01/2021, 9:35 PM

## 2021-07-01 NOTE — ED Notes (Signed)
Pressure is dropping at this time. Hospitalist made aware and awaiting orders. Will continue to monitor.

## 2021-07-01 NOTE — Progress Notes (Signed)
[facility] following for sepsis protocol. 

## 2021-07-01 NOTE — ED Triage Notes (Signed)
[facility]- pt here for altered mental status per family. Pt has dementia but not worse than baseline. Pt tested positive for covid on the 19th of this month.

## 2021-07-01 NOTE — ED Notes (Signed)
Patient is unable to do incentive or Albuterol inhaler. Will give nebs if patient needs for wheezes or sob . No wheezes noted now.

## 2021-07-01 NOTE — ED Notes (Signed)
Bladder scanned pt and 150 in bladder.

## 2021-07-01 NOTE — ED Provider Notes (Signed)
[facility] EMERGENCY DEPARTMENT Provider Note   CSN: [id number] Arrival date & time: 07/01/21  1601     History Chief Complaint  Patient presents with   Altered Mental Status    Laura Davenport is a 87 y.o. female.  Patient has COVID dementia diabetes.  She has been more lethargic lately than she had COVID and has not been eating or drinking  The history is provided by the patient, medical records and a relative.  Altered Mental Status Presenting symptoms: behavior changes   Severity:  Moderate Most recent episode:  More than 2 days ago Episode history:  Continuous Timing:  Constant Progression:  Worsening Chronicity:  New Context: not alcohol use   Associated symptoms: abdominal pain       Past Medical History:  Diagnosis Date   Anemia in chronic kidney disease    B12 deficiency anemia    Chronic renal disease, stage 3, moderately decreased glomerular filtration rate (GFR) between 30-59 mL/min/1.73 square meter (HCC)    Dementia ([place])    Depression    Essential hypertension    GI bleed    Severe gastritis and duodenal ulcers by EGD May 2016    Hyperlipidemia    PSVT (paroxysmal supraventricular tachycardia) ([place])    Septic arthritis (HCC)    MRSA, left knee    Type 2 diabetes mellitus ([place])     Patient Active Problem List   Diagnosis Date Noted   PNA (pneumonia) 07/01/2021   Sepsis due to gram-negative UTI ([place]) 02/11/2019   Acute cystitis without hematuria    Generalized weakness    Sepsis secondary to UTI ([place]) 02/09/2019   Acute renal failure superimposed on stage 3 chronic kidney disease ([place]) 02/09/2019   Chest pain on exertion 02/01/2019   Dementia ([place]) 02/01/2019   GERD (gastroesophageal reflux disease) 12/21/2018   Constipation 12/21/2018   Altered mental status 03/24/2017   Anemia in chronic kidney disease 01/20/2016   Chronic renal disease, stage 3, moderately decreased glomerular filtration rate (GFR) between 30-59 mL/min/1.73 square meter  ([place]) 01/07/2016   B12 deficiency anemia 10/25/2015   Dysphagia 03/13/2015   AP (abdominal pain)    Hematemesis without nausea    Atrial fibrillation and flutter ([place])    AKI (acute kidney injury) ([place])    Bacteremia    Essential hypertension    Septic joint of left knee joint ([place])    Blood poisoning    Fever 12/22/2014   History of fractured kneecap 12/22/2014   Knee fracture, left 12/22/2014   Sepsis ([place]) 12/22/2014   Tachycardia with 141 - 160 beats per minute 12/22/2014   Diabetes mellitus without complication ([place])    Hypertension    HLD (hyperlipidemia)    Depression     Past Surgical History:  Procedure Laterality Date   CHOLECYSTECTOMY N/A 11/04/2012   Procedure: LAPAROSCOPIC CHOLECYSTECTOMY;  Surgeon: Jamesetta So, MD;  Location: [facility];  Service: General;  Laterality: N/A;   ESOPHAGOGASTRODUODENOSCOPY N/A 01/06/2015   SLF: mild esophagitis   I & D EXTREMITY Left 12/24/2014   Procedure: IRRIGATION AND DEBRIDEMENT EXTREMITY AND ARTHROSCOPY KNEE;  Surgeon: Renette Butters, MD;  Location: [facility];  Service: Orthopedics;  Laterality: Left;   KNEE SURGERY     SAVORY DILATION  01/06/2015   Procedure: SAVORY DILATION;  Surgeon: Danie Binder, MD;  [facility];  Service: Endoscopy;;     OB History     Gravida  10   Para  10  Term  10   Preterm      AB      Living  9      SAB      IAB      Ectopic      Multiple      Live Births              Family History  Family history unknown: Yes    Social History   Tobacco Use   Smoking status: Never   Smokeless tobacco: Never   Tobacco comments:    Never smoked  Substance Use Topics   Alcohol use: No    Alcohol/week: 0.0 standard drinks   Drug use: No    Home Medications Prior to Admission medications   Medication Sig Start Date End Date Taking? Authorizing Provider  acetaminophen (TYLENOL) 500 MG tablet Take 500 mg by mouth every 6 (six) hours as needed.   Yes [provider]  aspirin EC 81 MG EC tablet Take 1 tablet (81 mg total) by mouth daily. 02/03/19  Yes Johnson, Clanford L, MD  CARTIA XT 240 MG 24 hr capsule Take 240 mg by mouth daily.  11/24/18  Yes [provider]  donepezil (ARICEPT) 10 MG tablet Take 10 mg by mouth at bedtime.  05/07/18  Yes [provider]  losartan (COZAAR) 100 MG tablet Take 100 mg by mouth daily.  05/03/19  Yes [provider]  pantoprazole (PROTONIX) 40 MG tablet TAKE 1 TABLET(40 MG) BY MOUTH DAILY BEFORE BREAKFAST 02/15/21  Yes [name], NP  vitamin B-12 (CYANOCOBALAMIN) 250 MCG tablet Take 250 mcg by mouth daily.   Yes [provider]    Allergies    Oxycodone  Review of Systems   Review of Systems  Unable to perform ROS: Dementia  Gastrointestinal:  Positive for abdominal pain.   Physical Exam Updated Vital Signs BP (!) 94/59   Pulse 88   Temp 97.6 F (36.4 C) (Rectal)   Resp 20   Ht 5\' 3"  (1.6 m)   Wt 55 kg   SpO2 95%   BMI 21.48 kg/m   Physical Exam Constitutional:      Appearance: She is well-developed.     Comments: Lethargic  HENT:     Head: Normocephalic.     Nose: Nose normal.     Mouth/Throat:     Comments: Dry mucous membrane Eyes:     General: No scleral icterus.    Conjunctiva/sclera: Conjunctivae normal.  Neck:     Thyroid: No thyromegaly.  Cardiovascular:     Rate and Rhythm: Normal rate and regular rhythm.     Heart sounds: No murmur heard.   No friction rub. No gallop.  Pulmonary:     Breath sounds: No stridor. No wheezing or rales.  Chest:     Chest wall: No tenderness.  Abdominal:     General: There is no distension.     Tenderness: There is no abdominal tenderness. There is no rebound.  Musculoskeletal:        General: Normal range of motion.     Cervical back: Neck supple.  Lymphadenopathy:     Cervical: No cervical adenopathy.  Skin:    Findings: No erythema or rash.  Neurological:     Motor: No abnormal muscle tone.      Coordination: Coordination normal.     Comments: Oriented to person only  Psychiatric:        Behavior: Behavior normal.  ED Results / Procedures / Treatments   Labs (all labs ordered are listed, but only abnormal results are displayed) Labs Reviewed  LACTIC ACID, PLASMA - Abnormal; Notable for the following components:      Result Value   Lactic Acid, Venous 2.5 (*)    All other components within normal limits  COMPREHENSIVE METABOLIC PANEL - Abnormal; Notable for the following components:   Sodium 134 (*)    Glucose, Bld 149 (*)    BUN 49 (*)    Creatinine, Ser 2.00 (*)    Calcium 8.2 (*)    Total Protein 6.2 (*)    Albumin 2.4 (*)    AST 44 (*)    GFR, Estimated 24 (*)    All other components within normal limits  CBC WITH DIFFERENTIAL/PLATELET - Abnormal; Notable for the following components:   RBC 3.08 (*)    Hemoglobin 10.4 (*)    HCT 30.0 (*)    All other components within normal limits  APTT - Abnormal; Notable for the following components:   aPTT 20 (*)    All other components within normal limits  CULTURE, BLOOD (ROUTINE X 2)  CULTURE, BLOOD (ROUTINE X 2)  RESP PANEL BY RT-PCR (FLU A&B, COVID) ARPGX2  PROTIME-INR  LACTIC ACID, PLASMA  URINALYSIS, ROUTINE W REFLEX MICROSCOPIC    EKG None  Radiology DG Chest Port 1 View  Result Date: 07/01/2021 CLINICAL DATA:  Altered mental status. EXAM: PORTABLE CHEST 1 VIEW COMPARISON:  06/19/2021 FINDINGS: New patchy ill-defined opacities overlying the mid and UPPER LATERAL RIGHT lung noted. The cardiomediastinal silhouette is unchanged. No pleural effusion or pneumothorax. Mild chronic LEFT lung opacities are again identified. No acute bony abnormalities are noted. IMPRESSION: New patchy ill-defined opacities overlying the mid and UPPER RIGHT lung suspicious for airspace disease/pneumonia. Electronically Signed   By: Margarette Canada M.D.   On: 07/01/2021 17:38    Procedures Procedures   Medications Ordered in  ED Medications  lactated ringers infusion ( Intravenous New Bag/Given 07/01/21 1805)  cefTRIAXone (ROCEPHIN) 2 g in sodium chloride 0.9 % 100 mL IVPB (0 g Intravenous Stopped 07/01/21 1821)  vancomycin (VANCOCIN) IVPB 1000 mg/200 mL premix (has no administration in time range)  azithromycin (ZITHROMAX) 500 mg in sodium chloride 0.9 % 250 mL IVPB (500 mg Intravenous New Bag/Given 07/01/21 1820)  sodium chloride 0.9 % bolus 2,000 mL (2,000 mLs Intravenous New Bag/Given 07/01/21 1721)  CRITICAL CARE Performed by: Milton Ferguson Total critical care time: 40 minutes Critical care time was exclusive of separately billable procedures and treating other patients. Critical care was necessary to treat or prevent imminent or life-threatening deterioration. Critical care was time spent personally by me on the following activities: development of treatment plan with patient and/or surrogate as well as nursing, discussions with consultants, evaluation of patient's response to treatment, examination of patient, obtaining history from patient or surrogate, ordering and performing treatments and interventions, ordering and review of laboratory studies, ordering and review of radiographic studies, pulse oximetry and re-evaluation of patient's condition.   ED Course  I have reviewed the triage vital signs and the nursing notes.  Pertinent labs & imaging results that were available during my care of the patient were reviewed by me and considered in my medical decision making (see chart for details).  Patient with COVID 19 from 11 days ago.  She is dehydrated and has an AKI.  Initially she was hypotensive and but she responded to IV fluid.  Patient has pneumonia which  is probably COVID MDM Rules/Calculators/A&P                           Patient with AKI dehydration hypotension initially and pneumonia possibly COVID-pneumonia.  She will be admitted to medicine Final Clinical Impression(s) / ED Diagnoses Final  diagnoses:  AKI (acute kidney injury) [facility])    Rx / [place] Orders ED Discharge Orders     None        Milton Ferguson, MD 07/01/21 1846

## 2021-07-02 ENCOUNTER — Other Ambulatory Visit: Payer: Self-pay

## 2021-07-02 ENCOUNTER — Encounter (HOSPITAL_COMMUNITY): Payer: Self-pay | Admitting: Internal Medicine

## 2021-07-02 DIAGNOSIS — I10 Essential (primary) hypertension: Secondary | ICD-10-CM | POA: Diagnosis not present

## 2021-07-02 DIAGNOSIS — F0390 Unspecified dementia without behavioral disturbance: Secondary | ICD-10-CM

## 2021-07-02 DIAGNOSIS — U071 COVID-19: Secondary | ICD-10-CM | POA: Diagnosis not present

## 2021-07-02 DIAGNOSIS — N179 Acute kidney failure, unspecified: Secondary | ICD-10-CM | POA: Diagnosis not present

## 2021-07-02 LAB — COMPREHENSIVE METABOLIC PANEL
ALT: 20 U/L (ref 0–44)
AST: 34 U/L (ref 15–41)
Albumin: 2.2 g/dL — ABNORMAL LOW (ref 3.5–5.0)
Alkaline Phosphatase: 57 U/L (ref 38–126)
Anion gap: 9 (ref 5–15)
BUN: 42 mg/dL — ABNORMAL HIGH (ref 8–23)
CO2: 20 mmol/L — ABNORMAL LOW (ref 22–32)
Calcium: 7.5 mg/dL — ABNORMAL LOW (ref 8.9–10.3)
Chloride: 109 mmol/L (ref 98–111)
Creatinine, Ser: 1.53 mg/dL — ABNORMAL HIGH (ref 0.44–1.00)
GFR, Estimated: 33 mL/min — ABNORMAL LOW (ref >60)
Glucose, Bld: 157 mg/dL — ABNORMAL HIGH (ref 70–99)
Potassium: 4.4 mmol/L (ref 3.5–5.1)
Sodium: 138 mmol/L (ref 135–145)
Total Bilirubin: 0.7 mg/dL (ref 0.3–1.2)
Total Protein: 6 g/dL — ABNORMAL LOW (ref 6.5–8.1)

## 2021-07-02 LAB — CBC WITH DIFFERENTIAL/PLATELET
Abs Immature Granulocytes: 0.04 10*3/uL (ref 0.00–0.07)
Basophils Absolute: 0 10*3/uL (ref 0.0–0.1)
Basophils Relative: 0 %
Eosinophils Absolute: 0 10*3/uL (ref 0.0–0.5)
Eosinophils Relative: 0 %
HCT: 30.4 % — ABNORMAL LOW (ref 36.0–46.0)
Hemoglobin: 10.4 g/dL — ABNORMAL LOW (ref 12.0–15.0)
Immature Granulocytes: 1 %
Lymphocytes Relative: 8 %
Lymphs Abs: 0.6 10*3/uL — ABNORMAL LOW (ref 0.7–4.0)
MCH: 33.7 pg (ref 26.0–34.0)
MCHC: 34.2 g/dL (ref 30.0–36.0)
MCV: 98.4 fL (ref 80.0–100.0)
Monocytes Absolute: 0.1 10*3/uL (ref 0.1–1.0)
Monocytes Relative: 1 %
Neutro Abs: 6.7 10*3/uL (ref 1.7–7.7)
Neutrophils Relative %: 90 %
Platelets: 185 10*3/uL (ref 150–400)
RBC: 3.09 MIL/uL — ABNORMAL LOW (ref 3.87–5.11)
RDW: 13.2 % (ref 11.5–15.5)
WBC: 7.5 10*3/uL (ref 4.0–10.5)
nRBC: 0 % (ref 0.0–0.2)

## 2021-07-02 LAB — C-REACTIVE PROTEIN: CRP: 16.2 mg/dL — ABNORMAL HIGH (ref <1.0)

## 2021-07-02 LAB — PHOSPHORUS: Phosphorus: 3.6 mg/dL (ref 2.5–4.6)

## 2021-07-02 LAB — PROCALCITONIN: Procalcitonin: 1.24 ng/mL

## 2021-07-02 LAB — D-DIMER, QUANTITATIVE: D-Dimer, Quant: 18.66 ug/mL-FEU — ABNORMAL HIGH (ref 0.00–0.50)

## 2021-07-02 LAB — FERRITIN: Ferritin: 582 ng/mL — ABNORMAL HIGH (ref 11–307)

## 2021-07-02 LAB — MAGNESIUM: Magnesium: 2.3 mg/dL (ref 1.7–2.4)

## 2021-07-02 NOTE — ED Notes (Signed)
Attempted report x1. 

## 2021-07-02 NOTE — Progress Notes (Signed)
PROGRESS NOTE    Laura Davenport  XBD:[id number] DOB: 1931-06-20 DOA: 07/01/2021 PCP: [name], MD    Chief Complaint  Patient presents with   Altered Mental Status    Brief Narrative:  As per H&P written by Dr. Denton Brick on 07/01/2021 Laura Davenport is a 87 y.o. female with medical history significant for CKD 3, atrial fibrillation, dementia, diabetes mellitus, hypertension. Patient was brought to the ED via EMS reports of altered mental status, also reported patient tested positive for COVID on the 19th of this month.  And since positive COVID test patient has not been doing well.  On the time of my evaluation, patient is alone in the room, she is talking but it is incomprehensible.  Spoke to patient's daughter on the phone- Laura Davenport, who is patient's primary decision maker.  She reports at baseline, patient most times can feed herself, mostly recognizes family, ambulates with a wheelchair, on several occasions her speech would not make sense, but she is able to answer simple questions. Daughter reports over the past week, patient has barely eaten or drunk anything, was not as alert as usual.  Patient has had a mild cough.  No difficulty breathing.   Patient did not receive any of the COVID vaccines, and no known prior COVID-19 infection.    Assessment & Plan: 1-Pneumonia due to COVID-19 virus -Pneumonia in the setting of COVID-19 and superimposed bacterial infection. -Continue treatment with steroids and antibiotic coverage for community-acquired pneumonia. -Patient is not hypoxic and completely out of the window for therapeutic intervention using remdesivir or Plaxlovid  -Procalcitonin level is up suggesting bacterial component. -Normal WBCs -Currently afebrile. -Follow culture results -Continue to follow inflammatory markers.  2-acute on chronic renal failure: Stage IIIb at baseline -Continue to follow renal function trend -Avoid nephrotoxic agent -Maintain adequate  hydration -Avoid hypotension and the use of contrast. -Creatinine demonstrating improvement already.  3-dehydration/hyponatremia -Improved with fluid resuscitation -Continue to follow electrolytes trend -Maintain appropriate hydration.  4-Essential hypertension -Holding antihypertensive agents currently in the setting of hypotension. -Continue to follow vital signs  5-underlying history of dementia ([place]) -Unknown baseline to me -Continue supportive care -Continue the use of donepezil -No behavioral disturbances currently appreciated.  6-anemia of chronic disease -Hemoglobin 10.4 -Overall stable -Continue patient follow-up with hematology.  7-history of PSVT -Continue outpatient follow-up with cardiology service -Currently holding cartia xr -in the setting of soft blood pressure. -Continue telemetry monitoring.     DVT prophylaxis: Heparin Code Status: Full code Family Communication: No family at bedside. Disposition:   Status is: Inpatient  Anticipated discharge date: aprox 48 hours; continue IV antibiotics, IV fluids, steroids and supportive care.  Follow renal function trend and patient's inflammatory markers.     Consultants:  None  Procedures:  See below for x-ray reports  Antimicrobials:  Rocephin and Zithromax   Subjective: Pleasantly confused, no chest pain, no nausea vomiting.  Slightly hypothermic on today's evaluation but otherwise with stable vital signs.  Objective: Vitals:   07/02/21 1500 07/02/21 1530 07/02/21 1600 07/02/21 1630  BP: 110/69 (!) 99/56 104/71 114/69  Pulse: 82 79 82 81  Resp: 16 18 16 17   Temp:      TempSrc:      SpO2: 97% 97% 97% 97%  Weight:      Height:        Intake/Output Summary (Last 24 hours) at 07/02/2021 1700 Last data filed at 07/01/2021 2148 Gross per 24 hour  Intake 2550 ml  Output  200 ml  Net 2350 ml   Filed Weights   07/01/21 1604  Weight: 55 kg    Examination:  General exam: Appears calm and  in no major distress; no nausea, no vomiting, no chest pain.  Good oxygen saturation on room air. Respiratory system: Positive scattered rhonchi, no wheezing, no using accessory muscle. Cardiovascular system: Rate controlled, no rubs, no gallops, no JVD or pedal edema on exam. Gastrointestinal system: Abdomen is nondistended, soft and nontender. No organomegaly or masses felt. Normal bowel sounds heard. Central nervous system: Able to follow commands; no focal neurologic deficit. Extremities: No cyanosis or clubbing. Skin: No petechiae. Psychiatry: Judgement and insight appear impaired secondary to underlying history of dementia.  Stable mood.    Data Reviewed: I have personally reviewed following labs and imaging studies  CBC: Recent Labs  Lab 07/01/21 1716 07/02/21 0503  WBC 7.4 7.5  NEUTROABS 6.2 6.7  HGB 10.4* 10.4*  HCT 30.0* 30.4*  MCV 97.4 98.4  PLT 195 846    Basic Metabolic Panel: Recent Labs  Lab 07/01/21 1716 07/02/21 0503  NA 134* 138  K 4.7 4.4  CL 100 109  CO2 24 20*  GLUCOSE 149* 157*  BUN 49* 42*  CREATININE 2.00* 1.53*  CALCIUM 8.2* 7.5*  MG  --  2.3  PHOS  --  3.6    GFR: Estimated Creatinine Clearance: 21 mL/min (A) (by C-G formula based on SCr of 1.53 mg/dL (H)).  Liver Function Tests: Recent Labs  Lab 07/01/21 1716 07/02/21 0503  AST 44* 34  ALT 22 20  ALKPHOS 58 57  BILITOT 0.8 0.7  PROT 6.2* 6.0*  ALBUMIN 2.4* 2.2*    CBG: No results for input(s): GLUCAP in the last 168 hours.   Recent Results (from the past 240 hour(s))  Blood Culture (routine x 2)     Status: None (Preliminary result)   Collection Time: 07/01/21  5:17 PM   Specimen: BLOOD RIGHT ARM  Result Value Ref Range Status   Specimen Description   Final    BLOOD RIGHT ARM BOTTLES DRAWN AEROBIC AND ANAEROBIC   Special Requests Blood Culture adequate volume  Final   Culture   Final    NO GROWTH < 24 HOURS Performed at [facility], [address], [place],  [place] [address]    Report Status PENDING  Incomplete  Blood Culture (routine x 2)     Status: None (Preliminary result)   Collection Time: 07/01/21  5:18 PM   Specimen: Left Antecubital; Blood  Result Value Ref Range Status   Specimen Description   Final    LEFT ANTECUBITAL BOTTLES DRAWN AEROBIC AND ANAEROBIC   Special Requests   Final    Blood Culture results may not be optimal due to an inadequate volume of blood received in culture bottles   Culture   Final    NO GROWTH < 24 HOURS Performed at [facility], [address] [place],  [address]    Report Status PENDING  Incomplete  Resp Panel by RT-PCR (Flu A&B, Covid) Nasopharyngeal Swab     Status: Abnormal   Collection Time: 07/01/21  5:20 PM   Specimen: Nasopharyngeal Swab; Nasopharyngeal(NP) swabs in vial transport medium  Result Value Ref Range Status   SARS Coronavirus 2 by RT PCR POSITIVE (A) NEGATIVE Final    Comment: CRITICAL RESULT CALLED TO, READ BACK BY AND VERIFIED WITH: NICHOLS,K 2007 07/01/2021 COLEMAN,R (NOTE) SARS-CoV-2 target nucleic acids are DETECTED.  The SARS-CoV-2 RNA is  generally detectable in upper respiratory specimens during the acute phase of infection. Positive results are indicative of the presence of the identified virus, but do not rule out bacterial infection or co-infection with other pathogens not detected by the test. Clinical correlation with patient history and other diagnostic information is necessary to determine patient infection status. The expected result is Negative.  Fact Sheet for Patients: EntrepreneurPulse.com.au  Fact Sheet for Healthcare Providers: IncredibleEmployment.be  This test is not yet approved or cleared by the [place] FDA and  has been authorized for detection and/or diagnosis of SARS-CoV-2 by FDA under an Emergency Use Authorization (EUA).  This EUA will remain in effect (meaning this  test can be used) for the duration of   the COVID-19 declaration under Section 564(b)(1) of the Act, 21 U.S.C. section 360bbb-3(b)(1), unless the authorization is terminated or revoked sooner.     Influenza A by PCR NEGATIVE NEGATIVE Final   Influenza B by PCR NEGATIVE NEGATIVE Final    Comment: (NOTE) The Xpert Xpress SARS-CoV-2/FLU/RSV plus assay is intended as an aid in the diagnosis of influenza from Nasopharyngeal swab specimens and should not be used as a sole basis for treatment. Nasal washings and aspirates are unacceptable for Xpert Xpress SARS-CoV-2/FLU/RSV testing.  Fact Sheet for Patients: EntrepreneurPulse.com.au  Fact Sheet for Healthcare Providers: IncredibleEmployment.be  This test is not yet approved or cleared by the [place] FDA and has been authorized for detection and/or diagnosis of SARS-CoV-2 by FDA under an Emergency Use Authorization (EUA). This EUA will remain in effect (meaning this test can be used) for the duration of the COVID-19 declaration under Section 564(b)(1) of the Act, 21 U.S.C. section 360bbb-3(b)(1), unless the authorization is terminated or revoked.  Performed at [facility], [address], [place], [place] [address]          Radiology Studies: [facility] Chest [facility] 1 View  Result Date: 07/01/2021 CLINICAL DATA:  Altered mental status. EXAM: PORTABLE CHEST 1 VIEW COMPARISON:  06/19/2021 FINDINGS: New patchy ill-defined opacities overlying the mid and UPPER LATERAL RIGHT lung noted. The cardiomediastinal silhouette is unchanged. No pleural effusion or pneumothorax. Mild chronic LEFT lung opacities are again identified. No acute bony abnormalities are noted. IMPRESSION: New patchy ill-defined opacities overlying the mid and UPPER RIGHT lung suspicious for airspace disease/pneumonia. Electronically Signed   By: Margarette Canada M.D.   On: 07/01/2021 17:38        Scheduled Meds:  vitamin C  500 mg Oral Daily   aspirin EC  81 mg Oral Daily    donepezil  10 mg Oral QHS   heparin  5,000 Units Subcutaneous Q8H   methylPREDNISolone (SOLU-MEDROL) injection  0.5 mg/kg Intravenous Q12H   Followed by   Derrill Memo ON 07/05/2021] predniSONE  50 mg Oral Daily   pantoprazole  40 mg Oral Daily   zinc sulfate  220 mg Oral Daily   Continuous Infusions:  sodium chloride 75 mL/hr at 07/02/21 1557     LOS: 1 day    Time spent: 35 minutes.    Barton Dubois, MD [facility]   To contact the attending provider between 7A-7P or the covering provider during after hours 7P-7A, please log into the web site [email or web address] and access using universal  password for that web site. If you do not have the password, please call the hospital operator.  07/02/2021, 5:00 PM

## 2021-07-02 NOTE — ED Notes (Addendum)
Family at bedside. 

## 2021-07-02 NOTE — ED Notes (Signed)
MD paged regarding rectal temp 96.5. Warm blankets placed prior to transport.

## 2021-07-03 DIAGNOSIS — F0390 Unspecified dementia without behavioral disturbance: Secondary | ICD-10-CM | POA: Diagnosis not present

## 2021-07-03 DIAGNOSIS — I10 Essential (primary) hypertension: Secondary | ICD-10-CM | POA: Diagnosis not present

## 2021-07-03 DIAGNOSIS — N179 Acute kidney failure, unspecified: Secondary | ICD-10-CM | POA: Diagnosis not present

## 2021-07-03 DIAGNOSIS — U071 COVID-19: Secondary | ICD-10-CM | POA: Diagnosis not present

## 2021-07-03 LAB — PHOSPHORUS: Phosphorus: 3.1 mg/dL (ref 2.5–4.6)

## 2021-07-03 LAB — COMPREHENSIVE METABOLIC PANEL
ALT: 22 U/L (ref 0–44)
AST: 26 U/L (ref 15–41)
Albumin: 2.4 g/dL — ABNORMAL LOW (ref 3.5–5.0)
Alkaline Phosphatase: 53 U/L (ref 38–126)
Anion gap: 10 (ref 5–15)
BUN: 40 mg/dL — ABNORMAL HIGH (ref 8–23)
CO2: 18 mmol/L — ABNORMAL LOW (ref 22–32)
Calcium: 7.9 mg/dL — ABNORMAL LOW (ref 8.9–10.3)
Chloride: 113 mmol/L — ABNORMAL HIGH (ref 98–111)
Creatinine, Ser: 1.4 mg/dL — ABNORMAL HIGH (ref 0.44–1.00)
GFR, Estimated: 36 mL/min — ABNORMAL LOW (ref >60)
Glucose, Bld: 184 mg/dL — ABNORMAL HIGH (ref 70–99)
Potassium: 4 mmol/L (ref 3.5–5.1)
Sodium: 141 mmol/L (ref 135–145)
Total Bilirubin: 0.7 mg/dL (ref 0.3–1.2)
Total Protein: 5.9 g/dL — ABNORMAL LOW (ref 6.5–8.1)

## 2021-07-03 LAB — CBC WITH DIFFERENTIAL/PLATELET
Abs Immature Granulocytes: 0.13 10*3/uL — ABNORMAL HIGH (ref 0.00–0.07)
Basophils Absolute: 0 10*3/uL (ref 0.0–0.1)
Basophils Relative: 0 %
Eosinophils Absolute: 0 10*3/uL (ref 0.0–0.5)
Eosinophils Relative: 0 %
HCT: 29.8 % — ABNORMAL LOW (ref 36.0–46.0)
Hemoglobin: 9.9 g/dL — ABNORMAL LOW (ref 12.0–15.0)
Immature Granulocytes: 1 %
Lymphocytes Relative: 13 %
Lymphs Abs: 1.5 10*3/uL (ref 0.7–4.0)
MCH: 32.1 pg (ref 26.0–34.0)
MCHC: 33.2 g/dL (ref 30.0–36.0)
MCV: 96.8 fL (ref 80.0–100.0)
Monocytes Absolute: 0.3 10*3/uL (ref 0.1–1.0)
Monocytes Relative: 3 %
Neutro Abs: 9.7 10*3/uL — ABNORMAL HIGH (ref 1.7–7.7)
Neutrophils Relative %: 83 %
Platelets: 244 10*3/uL (ref 150–400)
RBC: 3.08 MIL/uL — ABNORMAL LOW (ref 3.87–5.11)
RDW: 13.5 % (ref 11.5–15.5)
WBC: 11.6 10*3/uL — ABNORMAL HIGH (ref 4.0–10.5)
nRBC: 0 % (ref 0.0–0.2)

## 2021-07-03 LAB — C-REACTIVE PROTEIN: CRP: 7.9 mg/dL — ABNORMAL HIGH (ref <1.0)

## 2021-07-03 LAB — FERRITIN: Ferritin: 605 ng/mL — ABNORMAL HIGH (ref 11–307)

## 2021-07-03 LAB — PROCALCITONIN: Procalcitonin: 1.04 ng/mL

## 2021-07-03 LAB — D-DIMER, QUANTITATIVE: D-Dimer, Quant: 15.27 ug/mL-FEU — ABNORMAL HIGH (ref 0.00–0.50)

## 2021-07-03 LAB — MAGNESIUM: Magnesium: 2.5 mg/dL — ABNORMAL HIGH (ref 1.7–2.4)

## 2021-07-03 NOTE — Progress Notes (Signed)
   07/03/21 1604  Integumentary  Integumentary (WDL) X  Skin Integrity  (Stage II to left hip, DTI to right notified Dr. Dyann Kief)  [place]

## 2021-07-03 NOTE — Progress Notes (Signed)
Attempted to feed patient throughout shift, patient confused and swats at Probation officer and [place], [place]. Tried to give boost and she covered her head. Patient confused , asked if she wanted dinner she stated " yes " and then if you attempt to give her a bite she will swat her hand at you or put her arm over her mouth. Daughters called and updated multiple times during shift. Requested that no visitors be allowed during patient stay. Called front desk to notify

## 2021-07-03 NOTE — Progress Notes (Signed)
PROGRESS NOTE    Laura Davenport  EPP:[id number] DOB: 01/14/1931 DOA: 07/01/2021 PCP: [name], MD    Chief Complaint  Patient presents with   Altered Mental Status    Brief Narrative:  As per H&P written by Dr. [place] Brick on 07/01/2021 Laura Davenport is a 87 y.o. female with medical history significant for CKD 3, atrial fibrillation, dementia, diabetes mellitus, hypertension. Patient was brought to the ED via EMS reports of altered mental status, also reported patient tested positive for COVID on the 19th of this month.  And since positive COVID test patient has not been doing well.  On the time of my evaluation, patient is alone in the room, she is talking but it is incomprehensible.  Spoke to patient's daughter on the phone- [name], who is patient's primary decision maker.  She reports at baseline, patient most times can feed herself, mostly recognizes family, ambulates with a wheelchair, on several occasions her speech would not make sense, but she is able to answer simple questions. Daughter reports over the past week, patient has barely eaten or drunk anything, was not as alert as usual.  Patient has had a mild cough.  No difficulty breathing.   Patient did not receive any of the COVID vaccines, and no known prior COVID-19 infection.    Assessment & Plan: 1-Pneumonia due to COVID-19 virus -Pneumonia in the setting of COVID-19 and superimposed bacterial infection. -Continue treatment with steroids and antibiotic coverage for community-acquired pneumonia. -Patient is not hypoxic and completely out of the window for therapeutic intervention using remdesivir or Plaxlovid  -Procalcitonin level is up suggesting bacterial component. -WBCs slightly elevated currently in the setting of his steroid usage. -Patient has remained afebrile. -Continue to follow culture results. -Continue to follow inflammatory markers.  2-acute on chronic renal failure: Stage IIIb at  baseline -Continue to follow renal function trend -Avoid nephrotoxic agent -Maintain adequate hydration -Avoid hypotension and the use of contrast. -Creatinine demonstrating improvement already.  3-dehydration/hyponatremia -Improved with fluid resuscitation -Continue to follow electrolytes trend -Maintain appropriate hydration.  4-Essential hypertension -Holding antihypertensive agents currently in the setting of hypotension. -Continue to follow vital signs  5-underlying history of dementia ([place]) -Unknown baseline to me -Continue supportive care -Continue the use of donepezil -No behavioral disturbances currently appreciated.  6-anemia of chronic disease -Hemoglobin 10.4 -Overall stable -Continue patient follow-up with hematology.  7-history of PSVT -Continue outpatient follow-up with cardiology service -Currently holding cartia xr -in the setting of soft blood pressure. -Continue telemetry monitoring.     DVT prophylaxis: Heparin Code Status: Full code Family Communication: No family at bedside. Disposition:   Status is: Inpatient  Anticipated discharge date: aprox 24-48 hours; continue IV antibiotics, IV fluids, steroids and supportive care.  Follow renal function trend and patient's inflammatory markers.     Consultants:  None  Procedures:  See below for x-ray reports  Antimicrobials:  Rocephin and Zithromax   Subjective: Afebrile, no chest pain, no nausea or vomiting.  Pleasantly confused.  Frail and deconditioned.  Good saturation on room air appreciated.  Intermittent nonproductive cough reported.  Objective: Vitals:   07/02/21 1801 07/02/21 1829 07/03/21 0500 07/03/21 1613  BP:  130/78 130/82 140/66  Pulse:  82 79 82  Resp:  20 18   Temp: (!) 96.5 F (35.8 C) 97.7 F (36.5 C) (!) 97.1 F (36.2 C) (!) 97 F (36.1 C)  TempSrc: Rectal Oral Axillary Oral  SpO2:  92% 100% 100%  Weight:  Height:       No intake or output data in the 24  hours ending 07/03/21 [place]   07/01/21 1604  Weight: 55 kg    Examination: General exam: Afebrile, no chest pain, no nausea, no vomiting.  Pleasantly confused and cooperating with examination.  Good saturation on room air appreciated. Respiratory system: Positive rhonchi bilaterally; no wheezing on exam.  No using accessory muscles. Cardiovascular system:RRR. No murmurs, rubs, gallops.  No JVD.  No lower extremity edema appreciated. Gastrointestinal system: Abdomen is nondistended, soft and nontender. No organomegaly or masses felt. Normal bowel sounds heard. Central nervous system: No focal deficit appreciated; moving 4 limbs spontaneously. Extremities: No cyanosis or clubbing. Skin: No petechiae. Psychiatry: Judgement and insight appear impaired secondary to underlying dementia; stable mood.     Data Reviewed: I have personally reviewed following labs and imaging studies  CBC: Recent Labs  Lab 07/01/21 1716 07/02/21 0503 07/03/21 0630  WBC 7.4 7.5 11.6*  NEUTROABS 6.2 6.7 9.7*  HGB 10.4* 10.4* 9.9*  HCT 30.0* 30.4* 29.8*  MCV 97.4 98.4 96.8  PLT 195 185 854    Basic Metabolic Panel: Recent Labs  Lab 07/01/21 1716 07/02/21 0503 07/03/21 0630  NA 134* 138 141  K 4.7 4.4 4.0  CL 100 109 113*  CO2 24 20* 18*  GLUCOSE 149* 157* 184*  BUN 49* 42* 40*  CREATININE 2.00* 1.53* 1.40*  CALCIUM 8.2* 7.5* 7.9*  MG  --  2.3 2.5*  PHOS  --  3.6 3.1    GFR: Estimated Creatinine Clearance: 23 mL/min (A) (by C-G formula based on SCr of 1.4 mg/dL (H)).  Liver Function Tests: Recent Labs  Lab 07/01/21 1716 07/02/21 0503 07/03/21 0630  AST 44* 34 26  ALT 22 20 22   ALKPHOS 58 57 53  BILITOT 0.8 0.7 0.7  PROT 6.2* 6.0* 5.9*  ALBUMIN 2.4* 2.2* 2.4*    CBG: No results for input(s): GLUCAP in the last 168 hours.   Recent Results (from the past 240 hour(s))  Blood Culture (routine x 2)     Status: None (Preliminary result)   Collection Time: 07/01/21   5:17 PM   Specimen: BLOOD RIGHT ARM  Result Value Ref Range Status   Specimen Description   Final    BLOOD RIGHT ARM BOTTLES DRAWN AEROBIC AND ANAEROBIC   Special Requests Blood Culture adequate volume  Final   Culture   Final    NO GROWTH 2 DAYS Performed at [facility], [address] [place], [place] [address]    Report Status PENDING  Incomplete  Blood Culture (routine x 2)     Status: None (Preliminary result)   Collection Time: 07/01/21  5:18 PM   Specimen: Left Antecubital; Blood  Result Value Ref Range Status   Specimen Description   Final    LEFT ANTECUBITAL BOTTLES DRAWN AEROBIC AND ANAEROBIC   Special Requests   Final    Blood Culture results may not be optimal due to an inadequate volume of blood received in culture bottles   Culture   Final    NO GROWTH 2 DAYS Performed at [facility], [address] [place], [place] [address]    Report Status PENDING  Incomplete  Resp Panel by RT-PCR (Flu A&B, Covid) Nasopharyngeal Swab     Status: Abnormal   Collection Time: 07/01/21  5:20 PM   Specimen: Nasopharyngeal Swab; Nasopharyngeal(NP) swabs in vial transport medium  Result Value Ref Range Status   SARS  Coronavirus 2 by RT PCR POSITIVE (A) NEGATIVE Final    Comment: CRITICAL RESULT CALLED TO, READ BACK BY AND VERIFIED WITH: NICHOLS,K 2007 07/01/2021 COLEMAN,R (NOTE) SARS-CoV-2 target nucleic acids are DETECTED.  The SARS-CoV-2 RNA is generally detectable in upper respiratory specimens during the acute phase of infection. Positive results are indicative of the presence of the identified virus, but do not rule out bacterial infection or co-infection with other pathogens not detected by the test. Clinical correlation with patient history and other diagnostic information is necessary to determine patient infection status. The expected result is Negative.  Fact Sheet for Patients: EntrepreneurPulse.com.au  Fact Sheet for Healthcare  Providers: IncredibleEmployment.be  This test is not yet approved or cleared by the [place] FDA and  has been authorized for detection and/or diagnosis of SARS-CoV-2 by FDA under an Emergency Use Authorization (EUA).  This EUA will remain in effect (meaning this  test can be used) for the duration of  the COVID-19 declaration under Section 564(b)(1) of the Act, 21 U.S.C. section 360bbb-3(b)(1), unless the authorization is terminated or revoked sooner.     Influenza A by PCR NEGATIVE NEGATIVE Final   Influenza B by PCR NEGATIVE NEGATIVE Final    Comment: (NOTE) The Xpert Xpress SARS-CoV-2/FLU/RSV plus assay is intended as an aid in the diagnosis of influenza from Nasopharyngeal swab specimens and should not be used as a sole basis for treatment. Nasal washings and aspirates are unacceptable for Xpert Xpress SARS-CoV-2/FLU/RSV testing.  Fact Sheet for Patients: EntrepreneurPulse.com.au  Fact Sheet for Healthcare Providers: IncredibleEmployment.be  This test is not yet approved or cleared by the [place] FDA and has been authorized for detection and/or diagnosis of SARS-CoV-2 by FDA under an Emergency Use Authorization (EUA). This EUA will remain in effect (meaning this test can be used) for the duration of the COVID-19 declaration under Section 564(b)(1) of the Act, 21 U.S.C. section 360bbb-3(b)(1), unless the authorization is terminated or revoked.  Performed at [facility], [address], [place], [place] [address]      Radiology Studies: [facility] Chest [facility] 1 View  Result Date: 07/01/2021 CLINICAL DATA:  Altered mental status. EXAM: PORTABLE CHEST 1 VIEW COMPARISON:  06/19/2021 FINDINGS: New patchy ill-defined opacities overlying the mid and UPPER LATERAL RIGHT lung noted. The cardiomediastinal silhouette is unchanged. No pleural effusion or pneumothorax. Mild chronic LEFT lung opacities are again identified. No  acute bony abnormalities are noted. IMPRESSION: New patchy ill-defined opacities overlying the mid and UPPER RIGHT lung suspicious for airspace disease/pneumonia. Electronically Signed   By: Margarette Canada M.D.   On: 07/01/2021 17:38     Scheduled Meds:  vitamin C  500 mg Oral Daily   aspirin EC  81 mg Oral Daily   donepezil  10 mg Oral QHS   heparin  5,000 Units Subcutaneous Q8H   methylPREDNISolone (SOLU-MEDROL) injection  0.5 mg/kg Intravenous Q12H   Followed by   Derrill Memo ON 07/05/2021] predniSONE  50 mg Oral Daily   pantoprazole  40 mg Oral Daily   zinc sulfate  220 mg Oral Daily   Continuous Infusions:    LOS: 2 days    Time spent: 35 minutes.    Barton Dubois, MD [facility]   To contact the attending provider between 7A-7P or the covering provider during after hours 7P-7A, please log into the web site [email or web address] and access using universal [place] password for that web site. If you do not have the password, please call the hospital operator.  07/03/2021, 4:31 PM

## 2021-07-04 DIAGNOSIS — J181 Lobar pneumonia, unspecified organism: Secondary | ICD-10-CM

## 2021-07-04 DIAGNOSIS — I10 Essential (primary) hypertension: Secondary | ICD-10-CM | POA: Diagnosis not present

## 2021-07-04 DIAGNOSIS — F0280 Dementia in other diseases classified elsewhere without behavioral disturbance: Secondary | ICD-10-CM

## 2021-07-04 DIAGNOSIS — N171 Acute kidney failure with acute cortical necrosis: Secondary | ICD-10-CM

## 2021-07-04 DIAGNOSIS — N183 Chronic kidney disease, stage 3 unspecified: Secondary | ICD-10-CM

## 2021-07-04 DIAGNOSIS — G9341 Metabolic encephalopathy: Secondary | ICD-10-CM | POA: Diagnosis not present

## 2021-07-04 LAB — URINALYSIS, COMPLETE (UACMP) WITH MICROSCOPIC
Bilirubin Urine: NEGATIVE
Glucose, UA: NEGATIVE mg/dL
Hgb urine dipstick: NEGATIVE
Ketones, ur: 5 mg/dL — AB
Nitrite: NEGATIVE
Protein, ur: 30 mg/dL — AB
Specific Gravity, Urine: 1.016 (ref 1.005–1.030)
WBC, UA: >50 WBC/hpf — ABNORMAL HIGH (ref 0–5)
pH: 5 (ref 5.0–8.0)

## 2021-07-04 LAB — COMPREHENSIVE METABOLIC PANEL
ALT: 23 U/L (ref 0–44)
AST: 27 U/L (ref 15–41)
Albumin: 2.5 g/dL — ABNORMAL LOW (ref 3.5–5.0)
Alkaline Phosphatase: 52 U/L (ref 38–126)
Anion gap: 9 (ref 5–15)
BUN: 36 mg/dL — ABNORMAL HIGH (ref 8–23)
CO2: 19 mmol/L — ABNORMAL LOW (ref 22–32)
Calcium: 8.5 mg/dL — ABNORMAL LOW (ref 8.9–10.3)
Chloride: 115 mmol/L — ABNORMAL HIGH (ref 98–111)
Creatinine, Ser: 1.42 mg/dL — ABNORMAL HIGH (ref 0.44–1.00)
GFR, Estimated: 36 mL/min — ABNORMAL LOW (ref >60)
Glucose, Bld: 187 mg/dL — ABNORMAL HIGH (ref 70–99)
Potassium: 4.3 mmol/L (ref 3.5–5.1)
Sodium: 143 mmol/L (ref 135–145)
Total Bilirubin: 0.5 mg/dL (ref 0.3–1.2)
Total Protein: 6 g/dL — ABNORMAL LOW (ref 6.5–8.1)

## 2021-07-04 LAB — CBC WITH DIFFERENTIAL/PLATELET
Abs Immature Granulocytes: 0.22 10*3/uL — ABNORMAL HIGH (ref 0.00–0.07)
Basophils Absolute: 0 10*3/uL (ref 0.0–0.1)
Basophils Relative: 0 %
Eosinophils Absolute: 0 10*3/uL (ref 0.0–0.5)
Eosinophils Relative: 0 %
HCT: 27.8 % — ABNORMAL LOW (ref 36.0–46.0)
Hemoglobin: 9.3 g/dL — ABNORMAL LOW (ref 12.0–15.0)
Immature Granulocytes: 2 %
Lymphocytes Relative: 8 %
Lymphs Abs: 0.9 10*3/uL (ref 0.7–4.0)
MCH: 32.6 pg (ref 26.0–34.0)
MCHC: 33.5 g/dL (ref 30.0–36.0)
MCV: 97.5 fL (ref 80.0–100.0)
Monocytes Absolute: 0.3 10*3/uL (ref 0.1–1.0)
Monocytes Relative: 2 %
Neutro Abs: 10.4 10*3/uL — ABNORMAL HIGH (ref 1.7–7.7)
Neutrophils Relative %: 88 %
Platelets: 279 10*3/uL (ref 150–400)
RBC: 2.85 MIL/uL — ABNORMAL LOW (ref 3.87–5.11)
RDW: 13.5 % (ref 11.5–15.5)
WBC: 11.9 10*3/uL — ABNORMAL HIGH (ref 4.0–10.5)
nRBC: 0 % (ref 0.0–0.2)

## 2021-07-04 LAB — D-DIMER, QUANTITATIVE: D-Dimer, Quant: 12.85 ug/mL-FEU — ABNORMAL HIGH (ref 0.00–0.50)

## 2021-07-04 LAB — FERRITIN: Ferritin: 624 ng/mL — ABNORMAL HIGH (ref 11–307)

## 2021-07-04 LAB — MAGNESIUM: Magnesium: 2.5 mg/dL — ABNORMAL HIGH (ref 1.7–2.4)

## 2021-07-04 LAB — C-REACTIVE PROTEIN: CRP: 3.9 mg/dL — ABNORMAL HIGH (ref <1.0)

## 2021-07-04 LAB — PHOSPHORUS: Phosphorus: 3 mg/dL (ref 2.5–4.6)

## 2021-07-04 MED ORDER — AZITHROMYCIN 250 MG PO TABS
500.0000 mg | ORAL_TABLET | Freq: Every day | ORAL | Status: DC
  Administered 2021-07-04 – 2021-07-05 (×2): 500 mg via ORAL
  Filled 2021-07-04 (×4): qty 2

## 2021-07-04 MED ORDER — SODIUM CHLORIDE 0.9 % IV SOLN
1.0000 g | INTRAVENOUS | Status: DC
  Administered 2021-07-04 – 2021-07-05 (×2): 1 g via INTRAVENOUS
  Filled 2021-07-04 (×2): qty 10

## 2021-07-04 MED ORDER — POTASSIUM CHLORIDE IN NACL 20-0.9 MEQ/L-% IV SOLN: INTRAVENOUS | Status: DC

## 2021-07-04 MED FILL — Bebtelovimab IV Soln 175 MG/2ML: INTRAVENOUS | Qty: 2 | Status: CN

## 2021-07-04 NOTE — Plan of Care (Signed)
  Problem: Acute Rehab PT Goals(only PT should resolve) Goal: Pt Will Go Supine/Side To Sit Outcome: Progressing Flowsheets (Taken 07/04/2021 1233) Pt will go Supine/Side to Sit: with minimal assist Goal: Patient Will Transfer Sit To/From Stand Outcome: Progressing Flowsheets (Taken 07/04/2021 1233) Patient will transfer sit to/from stand: with moderate assist Goal: Pt Will Transfer Bed To Chair/Chair To Bed Outcome: Progressing Flowsheets (Taken 07/04/2021 1233) Pt will Transfer Bed to Chair/Chair to Bed:  with mod assist  with max assist Goal: Pt Will Ambulate Outcome: Progressing Flowsheets (Taken 07/04/2021 1233) Pt will Ambulate:  10 feet  with moderate assist  with rolling walker   12:33 PM, 07/04/21 Lonell Grandchild, MPT Physical Therapist with [facility] [phone] office [phone] mobile phone

## 2021-07-04 NOTE — Evaluation (Signed)
Physical Therapy Evaluation Patient Details Name: Laura Davenport MRN: [id number] DOB: 1931-05-30 Today's Date: 07/04/2021  History of Present Illness  Laura Davenport is a 87 y.o. female with medical history significant for CKD 3, atrial fibrillation, dementia, diabetes mellitus, hypertension.  Patient was brought to the ED via EMS reports of altered mental status, also reported patient tested positive for COVID on the 19th of this month.  And since positive COVID test patient has not been doing well.  On the time of my evaluation, patient is alone in the room, she is talking but it is incomprehensible.   Spoke to patient's daughter on the phone- Laura Davenport, who is patient's primary decision maker.  She reports at baseline, patient most times can feed herself, mostly recognizes family, ambulates with a wheelchair, on several occasions her speech would not make sense, but she is able to answer simple questions.  Daughter reports over the past week, patient has barely eaten or drunk anything, was not as alert as usual.  Patient has had a mild cough.  No difficulty breathing.   Clinical Impression  Patient demonstrates slow labored movement for sitting up at bedside requiring frequent verbal/tactile cueing to follow instructions, unable to fully stand after repeated attempts due to BLE weakness with feet sliding forward and very apprehensive due to fear of falling.  Patient put back to bed with Max assist to reposition.  Patient will benefit from continued physical therapy in hospital and recommended venue below to increase strength, balance, endurance for safe ADLs and gait.         Recommendations for follow up therapy are one component of a multi-disciplinary discharge planning process, led by the attending physician.  Recommendations may be updated based on patient status, additional functional criteria and insurance authorization.  Follow Up Recommendations Skilled nursing-short term rehab (<3  hours/day)    Assistance Recommended at Discharge Intermittent Supervision/Assistance  Functional Status Assessment Patient has had a recent decline in their functional status and demonstrates the ability to make significant improvements in function in a reasonable and predictable amount of time.  Equipment Recommendations  None recommended by PT    Recommendations for Other Services       Precautions / Restrictions Precautions Precautions: Fall Restrictions Weight Bearing Restrictions: No      Mobility  Bed Mobility Overal bed mobility: Needs Assistance Bed Mobility: Supine to Sit;Sit to Supine     Supine to sit: Mod assist Sit to supine: Mod assist   General bed mobility comments: slow labored movement with frequent verbal/tactile cueing    Transfers Overall transfer level: Needs assistance Equipment used: Rolling walker (2 wheels) Transfers: Sit to/from Stand Sit to Stand: Max assist           General transfer comment: limited to partial sit to stand due to feet sliding forward and patient very weak    Ambulation/Gait                Stairs            Wheelchair Mobility    Modified Rankin (Stroke Patients Only)       Balance Overall balance assessment: Needs assistance Sitting-balance support: Feet supported;No upper extremity supported Sitting balance-Leahy Scale: Fair Sitting balance - Comments: seated at EOB   Standing balance support: Reliant on assistive device for balance;Bilateral upper extremity supported;During functional activity Standing balance-Leahy Scale: Zero Standing balance comment: poor to zero using RW  Pertinent Vitals/Pain Pain Assessment: Faces Faces Pain Scale: No hurt    Home Living Family/patient expects to be discharged to:: Private residence Living Arrangements: Children Available Help at Discharge: Family;Available 24 hours/day Type of Home: Apartment Home  Access: Stairs to enter Entrance Stairs-Rails: Right Entrance Stairs-Number of Steps: 2   Home Layout: One level Home Equipment: Rollator (4 wheels);Wheelchair - manual;BSC Additional Comments: Info per prior admission due to patient is poor historian    Prior Function Prior Level of Function : Needs assist       Physical Assist : Mobility (physical) Mobility (physical): Bed mobility;Transfers;Gait;Stairs   Mobility Comments: household ambulator using RW ADLs Comments: assisted by family     Hand Dominance   Dominant Hand: Right    Extremity/Trunk Assessment   Upper Extremity Assessment Upper Extremity Assessment: Generalized weakness    Lower Extremity Assessment Lower Extremity Assessment: Generalized weakness    Cervical / Trunk Assessment Cervical / Trunk Assessment: Normal  Communication   Communication: No difficulties  Cognition Arousal/Alertness: Awake/alert Behavior During Therapy: Anxious Overall Cognitive Status: History of cognitive impairments - at baseline                                 General Comments: apprehensive due to fear of falling        General Comments      Exercises     Assessment/Plan    PT Assessment Patient needs continued PT services  PT Problem List Decreased strength;Decreased activity tolerance;Decreased balance;Decreased mobility       PT Treatment Interventions DME instruction;Gait training;Stair training;Functional mobility training;Therapeutic activities;Therapeutic exercise;Balance training;Patient/family education    PT Goals (Current goals can be found in the Care Plan section)  Acute Rehab PT Goals Patient Stated Goal: return home PT Goal Formulation: With patient Time For Goal Achievement: 07/18/21 Potential to Achieve Goals: Good    Frequency Min 3X/week   Barriers to discharge        Co-evaluation               AM-PAC PT "6 Clicks" Mobility  Outcome Measure Help needed  turning from your back to your side while in a flat bed without using bedrails?: A Lot Help needed moving from lying on your back to sitting on the side of a flat bed without using bedrails?: A Lot Help needed moving to and from a bed to a chair (including a wheelchair)?: Total Help needed standing up from a chair using your arms (e.g., wheelchair or bedside chair)?: Total Help needed to walk in hospital room?: Total Help needed climbing 3-5 steps with a railing? : Total 6 Click Score: 8    End of Session   Activity Tolerance: Patient tolerated treatment well;Patient limited by fatigue Patient left: in bed;with call bell/phone within reach;with bed alarm set Nurse Communication: Mobility status PT Visit Diagnosis: Unsteadiness on feet (R26.81);Other abnormalities of gait and mobility (R26.89)    Time: 6948-5462 PT Time Calculation (min) (ACUTE ONLY): 30 min   Charges:   PT Evaluation $PT Eval Moderate Complexity: 1 Mod PT Treatments $Therapeutic Activity: 23-37 mins        12:32 PM, 07/04/21 Lonell Grandchild, MPT Physical Therapist with [facility] 336 [phone] office [phone] mobile phone

## 2021-07-04 NOTE — TOC Initial Note (Signed)
Transition of Care [facility]) - Initial/Assessment Note    Patient Details  Name: Laura Davenport MRN: [id number] Date of Birth: 1931-07-29  [facility] CM/SW Contact:    Laura Lucks, RN Phone Number: 07/04/2021, 3:39 PM  Clinical Narrative:     Patient admitted with pneumonia due to [place]. PT is recommending SNF.  TOC spoke with daughter, Laura Davenport.  They only SNF we can offer for COVID positive patients is [facility]. Laura Davenport will discuss with her sisters to make a decision on SNF VS HH.  DC plan for Friday.              Expected Discharge Plan: [place] Barriers to Discharge: Continued Medical Work up  Patient Goals and CMS Choice Patient states their goals for this hospitalization and ongoing recovery are:: to get better. CMS Medicare.gov Compare Post Acute Care list provided to:: Patient Represenative (must comment) Choice offered to / list presented to : Adult Children  Expected Discharge Plan and Services Expected Discharge Plan: [place]      Prior Living Arrangements/Services    Patient language and need for interpreter reviewed:: Yes        Need for Family Participation in Patient Care: Yes (Comment) Care giver support system in place?: Yes (comment) Current home services: DME Criminal Activity/Legal Involvement Pertinent to Current Situation/Hospitalization: No - Comment as needed  Activities of Daily Living Home Assistive Devices/Equipment: None ADL Screening (condition at time of admission) Patient's cognitive ability adequate to safely complete daily activities?: No Is the patient deaf or have difficulty hearing?: No Does the patient have difficulty seeing, even when wearing glasses/contacts?: No Does the patient have difficulty concentrating, remembering, or making decisions?: Yes Patient able to express need for assistance with ADLs?: No Does the patient have difficulty dressing or bathing?: Yes Independently  performs ADLs?: No Communication: Dependent Is this a change from baseline?: Pre-admission baseline Dressing (OT): Needs assistance Is this a change from baseline?: Pre-admission baseline Grooming: Needs assistance Is this a change from baseline?: Pre-admission baseline Feeding: Needs assistance Is this a change from baseline?: Pre-admission baseline Bathing: Needs assistance Is this a change from baseline?: Pre-admission baseline Toileting: Needs assistance Is this a change from baseline?: Pre-admission baseline In/Out Bed: Needs assistance Is this a change from baseline?: Pre-admission baseline Walks in Home: Needs assistance Is this a change from baseline?: Pre-admission baseline Does the patient have difficulty walking or climbing stairs?: Yes Weakness of Legs: Both Weakness of Arms/Hands: Both  Permission Sought/Granted      Emotional Assessment    Admission diagnosis:  AKI (acute kidney injury) ([place]) [N17.9] PNA (pneumonia) [J18.9] Pneumonia due to COVID-19 virus [U07.1, J12.82] Patient Active Problem List   Diagnosis Date Noted   Pneumonia due to COVID-19 virus 07/01/2021   Sepsis due to gram-negative UTI ([place]) 02/11/2019   Acute cystitis without hematuria    Generalized weakness    Sepsis secondary to UTI ([place]) 02/09/2019   Acute renal failure superimposed on stage 3 chronic kidney disease ([place]) 02/09/2019   Chest pain on exertion 02/01/2019   Dementia ([place]) 02/01/2019   GERD (gastroesophageal reflux disease) 12/21/2018   Constipation 12/21/2018   Altered mental status 03/24/2017   Anemia in chronic kidney disease 01/20/2016   Chronic renal disease, stage 3, moderately decreased glomerular filtration rate (GFR) between 30-59 mL/min/1.73 square meter ([place]) 01/07/2016   B12 deficiency anemia 10/25/2015   Dysphagia 03/13/2015   AP (abdominal pain)  Hematemesis without nausea    Atrial fibrillation and flutter (HCC)    AKI (acute kidney injury) ([place])     Bacteremia    Essential hypertension    Septic joint of left knee joint ([place])    Blood poisoning    Fever 12/22/2014   History of fractured kneecap 12/22/2014   Knee fracture, left 12/22/2014   Sepsis ([place]) 12/22/2014   Tachycardia with 141 - 160 beats per minute 12/22/2014   Diabetes mellitus without complication ([place])    Hypertension    HLD (hyperlipidemia)    Depression    PCP:  Laura Noble, MD Pharmacy:   [facility], [address] [place] [address] Phone: [phone] Fax: [phone]   Readmission Risk Interventions Readmission Risk Prevention Plan 07/04/2021 02/12/2019 02/12/2019  Post Dischage Appt - - -  Medication Screening - - -  Transportation Screening Complete - -  PCP or Specialist Appt within 5-7 Days - Complete Complete  Home Care Screening Complete Complete -  Medication Review (RN CM) Complete - -  Some recent data might be hidden

## 2021-07-04 NOTE — Progress Notes (Addendum)
PROGRESS NOTE  [name] GTX:[id number] DOB: 12/23/1930 DOA: 07/01/2021 PCP: [name], MD  Brief History:  As per H&P written by Dr. Denton Brick on 07/01/2021 Laura Davenport is a 87 y.o. female with medical history significant for CKD 3, atrial fibrillation, dementia, diabetes mellitus, hypertension. Patient was brought to the ED via EMS reports of altered mental status, also reported patient tested positive for COVID on the 19th of this month.  And since positive COVID test patient has not been doing well.  She did have some functional improvement for a short time, but for past 2-3 days, she has declined functionally and cognitively.  On the time of Dr. Talmadge Coventry evaluation, patient is alone in the room, she is talking but it is incomprehensible.  Dr. Hassell Done to patient's daughter on the phone- Arhianna Ebey, who is patient's primary decision maker.  She reports at baseline, patient most times can feed herself, mostly recognizes family, is wheelchair bound, on several occasions her speech would not make sense, but she is able to answer simple questions. Daughter reports over the past week, patient has barely eaten or drunk anything, was not as alert as usual.  Patient has had a mild cough.  No difficulty breathing.     Patient did not receive any of the COVID vaccines, and no known prior COVID-19 infection.  Assessment/Plan: Acute metabolic encephalopathy -multifactorial including recent COVID-19, new bacterial pneumonia, AKI, and UTI -remains pleasantly confused -occasionally answers yes/no, otherwise incomprehensible speech  Lobar pneumonia -superimposed bacterial infection. -restart ceftriaxone and azithromycin -Patient is not hypoxic and completely out of the window for therapeutic intervention using remdesivir or Plaxlovid  -Procalcitonin 1.51 -WBCs slightly elevated currently in the setting of his steroid usage. -Patient has remained afebrile. -Continue to  follow culture results. -Continue to follow inflammatory markers.   acute on chronic renal failure: Stage IIIb at baseline -baseline creatinine 1.4-1.6 -serum creatinine peaked 2.00 -Avoid nephrotoxic agent -improved with IVF   dehydration/hyponatremia -Improved with fluid resuscitation -Continue to follow electrolytes trend    Essential hypertension -Holding antihypertensive agents currently in the setting of hypotension initially -Continue to follow vital signs   ementia ([place]) -Continue supportive care -Continue the use of donepezil -No behavioral disturbances currently appreciated.   anemia of chronic disease -baseline Hgb 10-11 -Continue patient follow-up with hematology.   history of PSVT -Continue outpatient follow-up with cardiology service -Currently holding cartia xr -in the setting of soft blood pressure. -Continue telemetry monitoring.          Status is: Inpatient  Remains inpatient appropriate because: altered mental status, requiring IV abx and IVF        Family Communication:   daughter Manuela Schwartz updated 11/2  Consultants:  none  Code Status:  FULL   DVT Prophylaxis:  [place] Heparin    Procedures: As Listed in Progress Note Above  Antibiotics: Ceftriaxone 11/2 Azithro 11/2>>   Total time spent 35 minutes.  Greater than 50% spent face to face counseling and coordinating care.    Subjective: Patient mumbles incomprehensibly.  Denies cp, sob.  Remainder ROS unobtainable  Objective: Vitals:   07/03/21 1613 07/03/21 1947 07/04/21 0422 07/04/21 1322  BP: 140/66 137/84 139/82 (!) 114/100  Pulse: 82 89 83 81  Resp:  19 19   Temp: (!) 97 F (36.1 C) 97.6 F (36.4 C) 97.6 F (36.4 C)   TempSrc: Oral Oral Oral   SpO2: 100% 97% 100% 98%  Weight:      Height:        Intake/Output Summary (Last 24 hours) at 07/04/2021 1739 Last data filed at 07/04/2021 1300 Gross per 24 hour  Intake 200 ml  Output 100 ml  Net 100 ml   Weight change:   Exam:  General:  Pt is alert, does not follow commands appropriately, not in acute distress HEENT: No icterus, No thrush, No neck mass, [place]/AT Cardiovascular: RRR, S1/S2, no rubs, no gallops Respiratory: bibasilar rales. No wheeze Abdomen: Soft/+BS, non tender, non distended, no guarding Extremities: No edema, No lymphangitis, No petechiae, No rashes, no synovitis   Data Reviewed: I have personally reviewed following labs and imaging studies Basic Metabolic Panel: Recent Labs  Lab 07/01/21 1716 07/02/21 0503 07/03/21 0630 07/04/21 0547  NA 134* 138 141 143  K 4.7 4.4 4.0 4.3  CL 100 109 113* 115*  CO2 24 20* 18* 19*  GLUCOSE 149* 157* 184* 187*  BUN 49* 42* 40* 36*  CREATININE 2.00* 1.53* 1.40* 1.42*  CALCIUM 8.2* 7.5* 7.9* 8.5*  MG  --  2.3 2.5* 2.5*  PHOS  --  3.6 3.1 3.0   Liver Function Tests: Recent Labs  Lab 07/01/21 1716 07/02/21 0503 07/03/21 0630 07/04/21 0547  AST 44* 34 26 27  ALT 22 20 22 23   ALKPHOS 58 57 53 52  BILITOT 0.8 0.7 0.7 0.5  PROT 6.2* 6.0* 5.9* 6.0*  ALBUMIN 2.4* 2.2* 2.4* 2.5*   No results for input(s): LIPASE, AMYLASE in the last 168 hours. No results for input(s): AMMONIA in the last 168 hours. Coagulation Profile: Recent Labs  Lab 07/01/21 1716  INR 1.1   CBC: Recent Labs  Lab 07/01/21 1716 07/02/21 0503 07/03/21 0630 07/04/21 0547  WBC 7.4 7.5 11.6* 11.9*  NEUTROABS 6.2 6.7 9.7* 10.4*  HGB 10.4* 10.4* 9.9* 9.3*  HCT 30.0* 30.4* 29.8* 27.8*  MCV 97.4 98.4 96.8 97.5  PLT 195 185 244 279   Cardiac Enzymes: No results for input(s): CKTOTAL, CKMB, CKMBINDEX, TROPONINI in the last 168 hours. BNP: Invalid input(s): POCBNP CBG: No results for input(s): GLUCAP in the last 168 hours. HbA1C: No results for input(s): HGBA1C in the last 72 hours. Urine analysis:    Component Value Date/Time   COLORURINE YELLOW 07/04/2021 1259   APPEARANCEUR CLOUDY (A) 07/04/2021 1259   LABSPEC 1.016 07/04/2021 1259   PHURINE 5.0  07/04/2021 1259   GLUCOSEU NEGATIVE 07/04/2021 1259   HGBUR NEGATIVE 07/04/2021 1259   BILIRUBINUR NEGATIVE 07/04/2021 1259   KETONESUR 5 (A) 07/04/2021 1259   PROTEINUR 30 (A) 07/04/2021 1259   UROBILINOGEN 0.2 01/17/2015 1000   NITRITE NEGATIVE 07/04/2021 1259   LEUKOCYTESUR LARGE (A) 07/04/2021 1259   Sepsis Labs: @LABRCNTIP (procalcitonin:4,lacticidven:4) ) Recent Results (from the past 240 hour(s))  Blood Culture (routine x 2)     Status: None (Preliminary result)   Collection Time: 07/01/21  5:17 PM   Specimen: BLOOD RIGHT ARM  Result Value Ref Range Status   Specimen Description   Final    BLOOD RIGHT ARM BOTTLES DRAWN AEROBIC AND ANAEROBIC   Special Requests Blood Culture adequate volume  Final   Culture   Final    NO GROWTH 3 DAYS Performed at [facility], [address], [place], [place] [address]    Report Status PENDING  Incomplete  Blood Culture (routine x 2)     Status: None (Preliminary result)   Collection Time: 07/01/21  5:18 PM   Specimen: Left Antecubital; Blood  Result Value Ref Range Status   Specimen Description   Final    LEFT ANTECUBITAL BOTTLES DRAWN AEROBIC AND ANAEROBIC   Special Requests   Final    Blood Culture results may not be optimal due to an inadequate volume of blood received in culture bottles   Culture   Final    NO GROWTH 3 DAYS Performed at [facility], [address], [place], [place] [address]    Report Status PENDING  Incomplete  Resp Panel by RT-PCR (Flu A&B, Covid) Nasopharyngeal Swab     Status: Abnormal   Collection Time: 07/01/21  5:20 PM   Specimen: Nasopharyngeal Swab; Nasopharyngeal(NP) swabs in vial transport medium  Result Value Ref Range Status   SARS Coronavirus 2 by RT PCR POSITIVE (A) NEGATIVE Final    Comment: CRITICAL RESULT CALLED TO, READ BACK BY AND VERIFIED WITH: NICHOLS,K 2007 07/01/2021 COLEMAN,R (NOTE) SARS-CoV-2 target nucleic acids are DETECTED.  The SARS-CoV-2 RNA is generally detectable in upper  respiratory specimens during the acute phase of infection. Positive results are indicative of the presence of the identified virus, but do not rule out bacterial infection or co-infection with other pathogens not detected by the test. Clinical correlation with patient history and other diagnostic information is necessary to determine patient infection status. The expected result is Negative.  Fact Sheet for Patients: EntrepreneurPulse.com.au  Fact Sheet for Healthcare Providers: IncredibleEmployment.be  This test is not yet approved or cleared by the [place] FDA and  has been authorized for detection and/or diagnosis of SARS-CoV-2 by FDA under an Emergency Use Authorization (EUA).  This EUA will remain in effect (meaning this  test can be used) for the duration of  the COVID-19 declaration under Section 564(b)(1) of the Act, 21 U.S.C. section 360bbb-3(b)(1), unless the authorization is terminated or revoked sooner.     Influenza A by PCR NEGATIVE NEGATIVE Final   Influenza B by PCR NEGATIVE NEGATIVE Final    Comment: (NOTE) The Xpert Xpress SARS-CoV-2/FLU/RSV plus assay is intended as an aid in the diagnosis of influenza from Nasopharyngeal swab specimens and should not be used as a sole basis for treatment. Nasal washings and aspirates are unacceptable for Xpert Xpress SARS-CoV-2/FLU/RSV testing.  Fact Sheet for Patients: EntrepreneurPulse.com.au  Fact Sheet for Healthcare Providers: IncredibleEmployment.be  This test is not yet approved or cleared by the [place] FDA and has been authorized for detection and/or diagnosis of SARS-CoV-2 by FDA under an Emergency Use Authorization (EUA). This EUA will remain in effect (meaning this test can be used) for the duration of the COVID-19 declaration under Section 564(b)(1) of the Act, 21 U.S.C. section 360bbb-3(b)(1), unless the authorization is  terminated or revoked.  Performed at [facility] [address], [place], [place] [address]      Scheduled Meds:  vitamin C  500 mg Oral Daily   aspirin EC  81 mg Oral Daily   azithromycin  500 mg Oral Daily   donepezil  10 mg Oral QHS   heparin  5,000 Units Subcutaneous Q8H   methylPREDNISolone (SOLU-MEDROL) injection  0.5 mg/kg Intravenous Q12H   Followed by   Derrill Memo ON 07/05/2021] predniSONE  50 mg Oral Daily   pantoprazole  40 mg Oral Daily   zinc sulfate  220 mg Oral Daily   Continuous Infusions:  cefTRIAXone (ROCEPHIN)  IV      Procedures/Studies: DG Chest Port 1 View  Result Date: 07/01/2021 CLINICAL DATA:  Altered mental status. EXAM: PORTABLE CHEST 1 VIEW COMPARISON:  06/19/2021 FINDINGS: New patchy ill-defined opacities overlying the mid and UPPER LATERAL RIGHT lung noted. The cardiomediastinal silhouette is unchanged. No pleural effusion or pneumothorax. Mild chronic LEFT lung opacities are again identified. No acute bony abnormalities are noted. IMPRESSION: New patchy ill-defined opacities overlying the mid and UPPER RIGHT lung suspicious for airspace disease/pneumonia. Electronically Signed   By: Margarette Canada M.D.   On: 07/01/2021 17:38   DG Chest Port 1 View  Result Date: 06/19/2021 CLINICAL DATA:  Possible sepsis EXAM: PORTABLE CHEST 1 VIEW COMPARISON:  02/09/2019 chest x-ray 03/24/2017 FINDINGS: The heart size and mediastinal contours are within normal limits. Aortic atherosclerosis. Both lungs are clear. Chronic fracture deformity at the right humeral neck. IMPRESSION: No active disease. Electronically Signed   By: Donavan Foil M.D.   On: 06/19/2021 19:05    Orson Eva, DO  [facility]  If 7PM-7AM, please contact night-coverage [email or web address] Password [email or web address] 07/04/2021, 5:39 PM   LOS: 3 days

## 2021-07-05 ENCOUNTER — Encounter (HOSPITAL_COMMUNITY): Payer: Self-pay | Admitting: Internal Medicine

## 2021-07-05 DIAGNOSIS — J1282 Pneumonia due to coronavirus disease 2019: Secondary | ICD-10-CM | POA: Diagnosis not present

## 2021-07-05 DIAGNOSIS — Z7189 Other specified counseling: Secondary | ICD-10-CM

## 2021-07-05 DIAGNOSIS — N171 Acute kidney failure with acute cortical necrosis: Secondary | ICD-10-CM | POA: Diagnosis not present

## 2021-07-05 DIAGNOSIS — G9341 Metabolic encephalopathy: Secondary | ICD-10-CM | POA: Diagnosis not present

## 2021-07-05 DIAGNOSIS — Z515 Encounter for palliative care: Secondary | ICD-10-CM

## 2021-07-05 DIAGNOSIS — J181 Lobar pneumonia, unspecified organism: Secondary | ICD-10-CM | POA: Diagnosis not present

## 2021-07-05 DIAGNOSIS — U071 COVID-19: Secondary | ICD-10-CM | POA: Diagnosis not present

## 2021-07-05 LAB — CBC WITH DIFFERENTIAL/PLATELET
Abs Immature Granulocytes: 0.22 10*3/uL — ABNORMAL HIGH (ref 0.00–0.07)
Basophils Absolute: 0 10*3/uL (ref 0.0–0.1)
Basophils Relative: 0 %
Eosinophils Absolute: 0 10*3/uL (ref 0.0–0.5)
Eosinophils Relative: 0 %
HCT: 28.1 % — ABNORMAL LOW (ref 36.0–46.0)
Hemoglobin: 9.3 g/dL — ABNORMAL LOW (ref 12.0–15.0)
Immature Granulocytes: 2 %
Lymphocytes Relative: 7 %
Lymphs Abs: 0.7 10*3/uL (ref 0.7–4.0)
MCH: 32.5 pg (ref 26.0–34.0)
MCHC: 33.1 g/dL (ref 30.0–36.0)
MCV: 98.3 fL (ref 80.0–100.0)
Monocytes Absolute: 0.3 10*3/uL (ref 0.1–1.0)
Monocytes Relative: 3 %
Neutro Abs: 8.9 10*3/uL — ABNORMAL HIGH (ref 1.7–7.7)
Neutrophils Relative %: 88 %
Platelets: 275 10*3/uL (ref 150–400)
RBC: 2.86 MIL/uL — ABNORMAL LOW (ref 3.87–5.11)
RDW: 13.9 % (ref 11.5–15.5)
WBC: 10.1 10*3/uL (ref 4.0–10.5)
nRBC: 0 % (ref 0.0–0.2)

## 2021-07-05 LAB — COMPREHENSIVE METABOLIC PANEL
ALT: 24 U/L (ref 0–44)
AST: 23 U/L (ref 15–41)
Albumin: 2.5 g/dL — ABNORMAL LOW (ref 3.5–5.0)
Alkaline Phosphatase: 53 U/L (ref 38–126)
Anion gap: 8 (ref 5–15)
BUN: 34 mg/dL — ABNORMAL HIGH (ref 8–23)
CO2: 19 mmol/L — ABNORMAL LOW (ref 22–32)
Calcium: 8.4 mg/dL — ABNORMAL LOW (ref 8.9–10.3)
Chloride: 117 mmol/L — ABNORMAL HIGH (ref 98–111)
Creatinine, Ser: 1.19 mg/dL — ABNORMAL HIGH (ref 0.44–1.00)
GFR, Estimated: 44 mL/min — ABNORMAL LOW (ref >60)
Glucose, Bld: 198 mg/dL — ABNORMAL HIGH (ref 70–99)
Potassium: 4.6 mmol/L (ref 3.5–5.1)
Sodium: 144 mmol/L (ref 135–145)
Total Bilirubin: 0.6 mg/dL (ref 0.3–1.2)
Total Protein: 5.7 g/dL — ABNORMAL LOW (ref 6.5–8.1)

## 2021-07-05 LAB — URINE CULTURE: Culture: NO GROWTH

## 2021-07-05 LAB — PHOSPHORUS: Phosphorus: 2.1 mg/dL — ABNORMAL LOW (ref 2.5–4.6)

## 2021-07-05 LAB — FERRITIN: Ferritin: 566 ng/mL — ABNORMAL HIGH (ref 11–307)

## 2021-07-05 LAB — MAGNESIUM: Magnesium: 2.5 mg/dL — ABNORMAL HIGH (ref 1.7–2.4)

## 2021-07-05 LAB — C-REACTIVE PROTEIN: CRP: 1.9 mg/dL — ABNORMAL HIGH (ref <1.0)

## 2021-07-05 LAB — D-DIMER, QUANTITATIVE: D-Dimer, Quant: 3.89 ug/mL-FEU — ABNORMAL HIGH (ref 0.00–0.50)

## 2021-07-05 NOTE — Progress Notes (Signed)
PROGRESS NOTE  Laura Davenport MVE:[id number] DOB: [birth year] DOA: 07/01/2021 PCP: Laura Noble, MD     Brief History:  As per H&P written by Dr. Denton Davenport on 07/01/2021 Laura Davenport is a 87 y.o. female with medical history significant for CKD 3, atrial fibrillation, dementia, diabetes mellitus, hypertension. Patient was brought to the ED via EMS reports of altered mental status, also reported patient tested positive for COVID on the 19th of this month.  And since positive COVID test patient has not been doing well.  She did have some functional improvement for a short time, but for past 2-3 days, she has declined functionally and cognitively.  On the time of Dr. Talmadge Davenport evaluation, patient is alone in the room, she is talking but it is incomprehensible.  Dr. Hassell Davenport to patient's daughter on the phone- Laura Davenport, who is patient's primary decision maker.  She reports at baseline, patient most times can feed herself, mostly recognizes family, is wheelchair bound, on several occasions her speech would not make sense, but she is able to answer simple questions. Daughter reports over the past week, patient has barely eaten or drunk anything, was not as alert as usual.  Patient has had a mild cough.  No difficulty breathing.     Patient did not receive any of the COVID vaccines, and no known prior COVID-19 infection.   Assessment/Plan: Acute metabolic encephalopathy -multifactorial including recent COVID-19, new bacterial pneumonia, AKI, and UTI -overall alert, remains pleasantly confused -occasionally answers yes/no, otherwise incomprehensible speech   Lobar pneumonia -superimposed bacterial infection. -restart ceftriaxone and azithromycin -Patient is not hypoxic and completely out of the window for therapeutic intervention using remdesivir or Plaxlovid  -Procalcitonin 1.51>>1.04 -WBCs slightly elevated currently in the setting of his steroid usage. -Patient has remained  afebrile. -blood cultures neg -urine culture neg, but pt was already on abx -Continue to follow inflammatory markers. -CRP 16.2>>7.9>.3.9>>1.9   acute on chronic renal failure: Stage IIIb at baseline -baseline creatinine 1.3-1.6 -serum creatinine peaked 2.00 -Avoid nephrotoxic agent -improved with IVF   dehydration/hyponatremia -Improved with fluid resuscitation -Continue to follow electrolytes trend     Essential hypertension -Holding antihypertensive agents currently in the setting of hypotension initially -Continue to follow vital signs   Dementia ([place]) -Continue supportive care -Continue the use of donepezil -No behavioral disturbances currently appreciated. -continues to have poor po intake despite assistance   anemia of chronic disease -baseline Hgb 10-11 -Continue patient follow-up with hematology.   history of PSVT -Continue outpatient follow-up with cardiology service -Currently holding cartia xr -in the setting of soft blood pressure. -Continue telemetry monitoring.               Status is: Inpatient   Remains inpatient appropriate because: altered mental status, requiring IV abx and IVF               Family Communication:   daughter Laura Davenport updated 11/2   Consultants:  none   Code Status:  FULL    DVT Prophylaxis:  [place] Heparin      Procedures: As Listed in Progress Note Above   Antibiotics: Ceftriaxone 11/2 Azithro 11/2>>      Subjective: Patient is more alert today.  States "how are you?"  Denies sob.  Otherwise ROS unobtainable  Objective: Vitals:   07/04/21 1322 07/04/21 1936 07/05/21 0323 07/05/21 1300  BP: (!) 114/100 137/75 122/76 118/88  Pulse: 81 99 85 77  Resp:  19 19 16   Temp:  97.7 F (36.5 C) 98.2 F (36.8 C) 97.8 F (36.6 C)  TempSrc:      SpO2: 98% 100% 100% 98%  Weight:      Height:        Intake/Output Summary (Last 24 hours) at 07/05/2021 1639 Last data filed at 07/05/2021 1300 Gross per 24 hour  Intake  880.61 ml  Output 400 ml  Net 480.61 ml   Weight change:  Exam:  General:  Pt is alert, follows commands appropriately, not in acute distress HEENT: No icterus, No thrush, No neck mass, [place]/AT Cardiovascular: RRR, S1/S2, no rubs, no gallops Respiratory: bibasilar rales. No wheeze Abdomen: Soft/+BS, non tender, non distended, no guarding Extremities: No edema, No lymphangitis, No petechiae, No rashes, no synovitis   Data Reviewed: I have personally reviewed following labs and imaging studies Basic Metabolic Panel: Recent Labs  Lab 07/01/21 1716 07/02/21 0503 07/03/21 0630 07/04/21 0547 07/05/21 0556  NA 134* 138 141 143 144  K 4.7 4.4 4.0 4.3 4.6  CL 100 109 113* 115* 117*  CO2 24 20* 18* 19* 19*  GLUCOSE 149* 157* 184* 187* 198*  BUN 49* 42* 40* 36* 34*  CREATININE 2.00* 1.53* 1.40* 1.42* 1.19*  CALCIUM 8.2* 7.5* 7.9* 8.5* 8.4*  MG  --  2.3 2.5* 2.5* 2.5*  PHOS  --  3.6 3.1 3.0 2.1*   Liver Function Tests: Recent Labs  Lab 07/01/21 1716 07/02/21 0503 07/03/21 0630 07/04/21 0547 07/05/21 0556  AST 44* 34 26 27 23   ALT 22 20 22 23 24   ALKPHOS 58 57 53 52 53  BILITOT 0.8 0.7 0.7 0.5 0.6  PROT 6.2* 6.0* 5.9* 6.0* 5.7*  ALBUMIN 2.4* 2.2* 2.4* 2.5* 2.5*   No results for input(s): LIPASE, AMYLASE in the last 168 hours. No results for input(s): AMMONIA in the last 168 hours. Coagulation Profile: Recent Labs  Lab 07/01/21 1716  INR 1.1   CBC: Recent Labs  Lab 07/01/21 1716 07/02/21 0503 07/03/21 0630 07/04/21 0547 07/05/21 0556  WBC 7.4 7.5 11.6* 11.9* 10.1  NEUTROABS 6.2 6.7 9.7* 10.4* 8.9*  HGB 10.4* 10.4* 9.9* 9.3* 9.3*  HCT 30.0* 30.4* 29.8* 27.8* 28.1*  MCV 97.4 98.4 96.8 97.5 98.3  PLT 195 185 244 279 275   Cardiac Enzymes: No results for input(s): CKTOTAL, CKMB, CKMBINDEX, TROPONINI in the last 168 hours. BNP: Invalid input(s): POCBNP CBG: No results for input(s): GLUCAP in the last 168 hours. HbA1C: No results for input(s): HGBA1C in the  last 72 hours. Urine analysis:    Component Value Date/Time   COLORURINE YELLOW 07/04/2021 1259   APPEARANCEUR CLOUDY (A) 07/04/2021 1259   LABSPEC 1.016 07/04/2021 1259   PHURINE 5.0 07/04/2021 1259   GLUCOSEU NEGATIVE 07/04/2021 1259   HGBUR NEGATIVE 07/04/2021 1259   BILIRUBINUR NEGATIVE 07/04/2021 1259   KETONESUR 5 (A) 07/04/2021 1259   PROTEINUR 30 (A) 07/04/2021 1259   UROBILINOGEN 0.2 01/17/2015 1000   NITRITE NEGATIVE 07/04/2021 1259   LEUKOCYTESUR LARGE (A) 07/04/2021 1259   Sepsis Labs: @LABRCNTIP (procalcitonin:4,lacticidven:4) ) Recent Results (from the past 240 hour(s))  Blood Culture (routine x 2)     Status: None (Preliminary result)   Collection Time: 07/01/21  5:17 PM   Specimen: BLOOD RIGHT ARM  Result Value Ref Range Status   Specimen Description   Final    BLOOD RIGHT ARM BOTTLES DRAWN AEROBIC AND ANAEROBIC   Special Requests Blood Culture adequate volume  Final   Culture  Final    NO GROWTH 4 DAYS Performed at [facility], [address], [place], [place] [address]    Report Status PENDING  Incomplete  Blood Culture (routine x 2)     Status: None (Preliminary result)   Collection Time: 07/01/21  5:18 PM   Specimen: Left Antecubital; Blood  Result Value Ref Range Status   Specimen Description   Final    LEFT ANTECUBITAL BOTTLES DRAWN AEROBIC AND ANAEROBIC   Special Requests   Final    Blood Culture results may not be optimal due to an inadequate volume of blood received in culture bottles   Culture   Final    NO GROWTH 4 DAYS Performed at [facility], [address] [place], [place] [address]    Report Status PENDING  Incomplete  Resp Panel by RT-PCR (Flu A&B, Covid) Nasopharyngeal Swab     Status: Abnormal   Collection Time: 07/01/21  5:20 PM   Specimen: Nasopharyngeal Swab; Nasopharyngeal(NP) swabs in vial transport medium  Result Value Ref Range Status   SARS Coronavirus 2 by RT PCR POSITIVE (A) NEGATIVE Final    Comment: CRITICAL RESULT  CALLED TO, READ BACK BY AND VERIFIED WITH: NICHOLS,K 2007 07/01/2021 COLEMAN,R (NOTE) SARS-CoV-2 target nucleic acids are DETECTED.  The SARS-CoV-2 RNA is generally detectable in upper respiratory specimens during the acute phase of infection. Positive results are indicative of the presence of the identified virus, but do not rule out bacterial infection or co-infection with other pathogens not detected by the test. Clinical correlation with patient history and other diagnostic information is necessary to determine patient infection status. The expected result is Negative.  Fact Sheet for Patients: EntrepreneurPulse.com.au  Fact Sheet for Healthcare Providers: IncredibleEmployment.be  This test is not yet approved or cleared by the [place] FDA and  has been authorized for detection and/or diagnosis of SARS-CoV-2 by FDA under an Emergency Use Authorization (EUA).  This EUA will remain in effect (meaning this  test can be used) for the duration of  the COVID-19 declaration under Section 564(b)(1) of the Act, 21 U.S.C. section 360bbb-3(b)(1), unless the authorization is terminated or revoked sooner.     Influenza A by PCR NEGATIVE NEGATIVE Final   Influenza B by PCR NEGATIVE NEGATIVE Final    Comment: (NOTE) The Xpert Xpress SARS-CoV-2/FLU/RSV plus assay is intended as an aid in the diagnosis of influenza from Nasopharyngeal swab specimens and should not be used as a sole basis for treatment. Nasal washings and aspirates are unacceptable for Xpert Xpress SARS-CoV-2/FLU/RSV testing.  Fact Sheet for Patients: EntrepreneurPulse.com.au  Fact Sheet for Healthcare Providers: IncredibleEmployment.be  This test is not yet approved or cleared by the [place] FDA and has been authorized for detection and/or diagnosis of SARS-CoV-2 by FDA under an Emergency Use Authorization (EUA). This EUA will remain in  effect (meaning this test can be used) for the duration of the COVID-19 declaration under Section 564(b)(1) of the Act, 21 U.S.C. section 360bbb-3(b)(1), unless the authorization is terminated or revoked.  Performed at [facility], [address], [place], [place] [address]   Urine Culture     Status: None   Collection Time: 07/04/21 12:59 PM   Specimen: Urine, Catheterized  Result Value Ref Range Status   Specimen Description   Final    URINE, CATHETERIZED Performed at [facility], [address] [place], [place] [address]    Special Requests   Final    NONE Performed at [facility], [place]  [address], [place], [place] [address]    Culture   Final    NO GROWTH Performed at [facility], [place] [address], [place], [place] [address]    Report Status 07/05/2021 FINAL  Final     Scheduled Meds:  vitamin C  500 mg Oral Daily   aspirin EC  81 mg Oral Daily   azithromycin  500 mg Oral Daily   donepezil  10 mg Oral QHS   heparin  5,000 Units Subcutaneous Q8H   pantoprazole  40 mg Oral Daily   predniSONE  50 mg Oral Daily   zinc sulfate  220 mg Oral Daily   Continuous Infusions:  0.9 % NaCl with KCl 20 mEq / L 75 mL/hr at 07/05/21 1031   cefTRIAXone (ROCEPHIN)  IV 200 mL/hr at 07/04/21 1803    Procedures/Studies: DG Chest Port 1 View  Result Date: 07/01/2021 CLINICAL DATA:  Altered mental status. EXAM: PORTABLE CHEST 1 VIEW COMPARISON:  06/19/2021 FINDINGS: New patchy ill-defined opacities overlying the mid and UPPER LATERAL RIGHT lung noted. The cardiomediastinal silhouette is unchanged. No pleural effusion or pneumothorax. Mild chronic LEFT lung opacities are again identified. No acute bony abnormalities are noted. IMPRESSION: New patchy ill-defined opacities overlying the mid and UPPER RIGHT lung suspicious for airspace disease/pneumonia. Electronically Signed   By: Margarette Canada M.D.   On: 07/01/2021 17:38   DG Chest Port 1 View  Result Date: 06/19/2021 CLINICAL DATA:   Possible sepsis EXAM: PORTABLE CHEST 1 VIEW COMPARISON:  02/09/2019 chest x-ray 03/24/2017 FINDINGS: The heart size and mediastinal contours are within normal limits. Aortic atherosclerosis. Both lungs are clear. Chronic fracture deformity at the right humeral neck. IMPRESSION: No active disease. Electronically Signed   By: Donavan Foil M.D.   On: 06/19/2021 19:05    Orson Eva, DO  [facility]  If 7PM-7AM, please contact night-coverage [email or web address] Password [email or web address] 07/05/2021, 4:39 PM   LOS: 4 days

## 2021-07-05 NOTE — Consult Note (Signed)
Consultation Note Date: 07/05/2021   Patient Name: Laura Davenport  DOB: 04-24-1931  MRN: [id number]  Age / Sex: 87 y.o., female  PCP: Laura Noble, MD Referring Physician: Orson Eva, MD  Reason for Consultation: Establishing goals of care  HPI/Patient Profile: 87 y.o. female  with past medical history of dementia, A. fib, CKD 3, DM, HTN, poor mobility mostly wheelchair-bound, daughter Laura Davenport lives with patient admitted on 07/01/2021 with acute metabolic encephalopathy multifactorial including COVID, new bacterial pneumonia, acute kidney injury.   Clinical Assessment and Goals of Care: I have reviewed medical records including EPIC notes, labs and imaging, received report from RN, assessed the patient.  Laura Davenport is lying in bed.  She does not speak to me or try to interact with me in any meaningful way.  She has known dementia and I do not ask orientation questions.  She is unable to make her basic needs known.    Call to daughter/main decision maker, Laura Davenport  to discuss diagnosis prognosis, Laura Davenport, Laura Davenport wishes, disposition and options.  I introduced Palliative Medicine as specialized medical care for people living with serious illness. It focuses on providing relief from the symptoms and stress of a serious illness. The goal is to improve quality of life for both the patient and the family.  We discussed a brief life review of the patient.  Laura Davenport states that Laura Davenport daughter Laura Davenport lives with her, provides care.  Family works together to provide care for Laura Davenport, making choices as a team.    We then focused on their current illness.  We talked about her acute and chronic health concerns and the treatment plan.  The natural disease trajectory and expectations at Laura Davenport were discussed.  I attempted to elicit values and goals of care important to the patient. Advanced directives, concepts specific  to code status, artifical feeding and hydration, and rehospitalization were considered and discussed.  Laura Davenport endorses that her mother had always wanted DNR status.  Palliative Care services outpatient were explained and offered, at this point Laura Davenport would like to discuss choices with her siblings  Discussed the importance of continued conversation with family and the medical providers regarding overall plan of care and treatment options, ensuring decisions are within the context of the patient's values and GOCs.  Questions and concerns were addressed.  The family was encouraged to call with questions or concerns.  PMT will continue to support holistically.  Conference with attending, bedside nursing staff, transition of care team related to patient condition, needs, goals of care, disposition.   HCPOA   Laura Davenport -Laura Davenport has 9 children, daughter Laura Davenport is main decision maker, but they make choices as a team.  Daughter Laura Davenport lives with Laura Davenport.    SUMMARY OF RECOMMENDATIONS   At this point continue to treat the treatable but no CPR or intubation Home with home health. Considering outpatient palliative services   Code Status/Advance Care Planning: DNR -Laura Davenport states that she will bring Laura Davenport's living will.  Symptom Management:  Per hospitalist, no additional needs at this time.  Palliative Prophylaxis:  Frequent Pain Assessment, Oral Care, and Turn Reposition  Additional Recommendations (Limitations, Scope, Preferences): Treat the treatable but no extraordinary measures  Psycho-social/Spiritual:  Desire for further Chaplaincy support:no Additional Recommendations: Caregiving  Support/Resources and Education on Hospice  Prognosis:  Unable to determine, guarded at this point.  If no meaningful recovery, weeks could be possible.  Discharge Planning:  To be determined, PT suggest short-term rehab.  Only bed offer [facility] at [facility], due to  COVID-positive status       Primary Diagnoses: Present on Admission:  Acute renal failure superimposed on stage 3 chronic kidney disease ([place])  Dementia ([place])  Essential hypertension  Pneumonia due to COVID-19 virus   I have reviewed the medical record, interviewed the patient and family, and examined the patient. The following aspects are pertinent.  Past Medical History:  Diagnosis Date   Anemia in chronic kidney disease    B12 deficiency anemia    Chronic renal disease, stage 3, moderately decreased glomerular filtration rate (GFR) between 30-59 mL/min/1.73 square meter (HCC)    Dementia (HCC)    Depression    Essential hypertension    GI bleed    Severe gastritis and duodenal ulcers by EGD May 2016    Hyperlipidemia    PSVT (paroxysmal supraventricular tachycardia) (HCC)    Septic arthritis (HCC)    MRSA, left knee    Type 2 diabetes mellitus ([place])    Social History   Socioeconomic History   Marital status: Widowed    Spouse name: Not on file   Number of children: 9   Years of education: Not on file   Highest education level: Not on file  Occupational History   Occupation: retired; Education officer, museum  Tobacco Use   Smoking status: Never   Smokeless tobacco: Never   Tobacco comments:    Never smoked  Substance and Sexual Activity   Alcohol use: No    Alcohol/week: 0.0 standard drinks   Drug use: No   Sexual activity: Not Currently  Other Topics Concern   Not on file  Social History Narrative   Lives alone   3 daughters & granddaughter nearby   Lost 1 son-strep         Social Determinants of Health   Financial Resource Strain: Low Risk    Difficulty of Paying Living Expenses: Not hard at all  Food Insecurity: No Food Insecurity   Worried About Charity fundraiser in the Last Year: Never true   Arboriculturist in the Last Year: Never true  Transportation Needs: No Transportation Needs   Lack of Transportation (Medical): No   Lack of Transportation  (Non-Medical): No  Physical Activity: Inactive   Days of Exercise per Week: 0 days   Minutes of Exercise per Session: 0 min  Stress: No Stress Concern Present   Feeling of Stress : Not at all  Social Connections: Moderately Isolated   Frequency of Communication with Friends and Family: Three times a week   Frequency of Social Gatherings with Friends and Family: More than three times a week   Attends Religious Services: More than 4 times per year   Active Member of Clubs or Organizations: No   Attends Archivist Meetings: Never   Marital Status: Widowed   Family History  Family history unknown: Yes   Scheduled Meds:  vitamin C  500 mg Oral Daily   aspirin EC  81 mg Oral Daily   azithromycin  500 mg Oral Daily   donepezil  10 mg Oral QHS   heparin  5,000 Units Subcutaneous Q8H   pantoprazole  40 mg Oral Daily   predniSONE  50 mg Oral Daily   zinc sulfate  220 mg Oral Daily   Continuous Infusions:  0.9 % NaCl with KCl 20 mEq / L 75 mL/hr at 07/05/21 1031   cefTRIAXone (ROCEPHIN)  IV 200 mL/hr at 07/04/21 1803   PRN Meds:.acetaminophen, guaiFENesin-dextromethorphan, ipratropium-albuterol, polyethylene glycol, promethazine Medications Prior to Admission:  Prior to Admission medications   Medication Sig Start Date End Date Taking? Authorizing Provider  acetaminophen (TYLENOL) 500 MG tablet Take 500 mg by mouth every 6 (six) hours as needed.   Yes [provider]  aspirin EC 81 MG EC tablet Take 1 tablet (81 mg total) by mouth daily. 02/03/19  Yes Johnson, Clanford L, MD  CARTIA XT 240 MG 24 hr capsule Take 240 mg by mouth daily.  11/24/18  Yes [provider]  donepezil (ARICEPT) 10 MG tablet Take 10 mg by mouth at bedtime.  05/07/18  Yes [provider]  losartan (COZAAR) 100 MG tablet Take 100 mg by mouth daily.  05/03/19  Yes [provider]  pantoprazole (PROTONIX) 40 MG tablet TAKE 1 TABLET(40 MG) BY MOUTH DAILY BEFORE BREAKFAST 02/15/21   Yes [name], NP  vitamin B-12 (CYANOCOBALAMIN) 250 MCG tablet Take 250 mcg by mouth daily.   Yes [provider]   Allergies  Allergen Reactions   Oxycodone Hives   Review of Systems  Unable to perform ROS: Dementia   Physical Exam Vitals and nursing note reviewed.  Constitutional:      General: She is not in acute distress.    Appearance: She is ill-appearing.  Cardiovascular:     Rate and Rhythm: Normal rate.  Pulmonary:     Effort: Pulmonary effort is normal. No respiratory distress.  Skin:    General: Skin is warm.  Neurological:     Mental Status: She is alert.     Comments: Dementia    Vital Signs: BP 122/76 (BP Location: Right Arm)   Pulse 85   Temp 98.2 F (36.8 C)   Resp 19   Ht 5\' 3"  (1.6 m)   Wt 55 kg   SpO2 100%   BMI 21.48 kg/m  Pain Scale: Faces   Pain Score: 0-No pain   SpO2: SpO2: 100 % O2 Device:SpO2: 100 % O2 Flow Rate: .   IO: Intake/output summary:  Intake/Output Summary (Last 24 hours) at 07/05/2021 1357 Last data filed at 07/05/2021 0900 Gross per 24 hour  Intake 830.61 ml  Output 400 ml  Net 430.61 ml    LBM: Last BM Date: 07/01/21 Baseline Weight: Weight: 55 kg Most recent weight: Weight: 55 kg     Palliative Assessment/Data:   Flowsheet Rows    Flowsheet Row Most Recent Value  Intake Tab   Referral Department Hospitalist  Unit at Time of Referral Med/Surg Unit  Palliative Care Primary Diagnosis Pulmonary  Date Notified 07/04/21  Palliative Care Type New Palliative care  Reason for referral Clarify Goals of Care  Date of Admission 07/01/21  Date first seen by Palliative Care 07/05/21  # of days Palliative referral response time 1 Day(s)  # of days IP prior to Palliative referral 3  Clinical Assessment   Palliative Performance Scale Score 20%  Pain Max last 24 hours Not able to report  Pain Min Last 24 hours Not able to report  Dyspnea Max Last 24 Hours Not able to report  Dyspnea Min Last 24 hours Not  able to report  Psychosocial & Spiritual Assessment   Palliative Care Outcomes        Time In: 1100 Time Out: 1210  Time Total: 70 minutes  Greater than 50%  of this time was spent counseling and coordinating care related to the above assessment and plan.  Signed by: Drue Novel, NP   Please contact Palliative Medicine Team phone at [phone] for questions and concerns.  For individual provider: See Shea Evans

## 2021-07-06 DIAGNOSIS — G9341 Metabolic encephalopathy: Secondary | ICD-10-CM | POA: Diagnosis not present

## 2021-07-06 DIAGNOSIS — N171 Acute kidney failure with acute cortical necrosis: Secondary | ICD-10-CM | POA: Diagnosis not present

## 2021-07-06 DIAGNOSIS — U071 COVID-19: Secondary | ICD-10-CM | POA: Diagnosis not present

## 2021-07-06 DIAGNOSIS — J181 Lobar pneumonia, unspecified organism: Secondary | ICD-10-CM | POA: Diagnosis not present

## 2021-07-06 LAB — CBC WITH DIFFERENTIAL/PLATELET
Abs Immature Granulocytes: 0.29 10*3/uL — ABNORMAL HIGH (ref 0.00–0.07)
Basophils Absolute: 0 10*3/uL (ref 0.0–0.1)
Basophils Relative: 0 %
Eosinophils Absolute: 0 10*3/uL (ref 0.0–0.5)
Eosinophils Relative: 0 %
HCT: 27.5 % — ABNORMAL LOW (ref 36.0–46.0)
Hemoglobin: 9.2 g/dL — ABNORMAL LOW (ref 12.0–15.0)
Immature Granulocytes: 3 %
Lymphocytes Relative: 14 %
Lymphs Abs: 1.6 10*3/uL (ref 0.7–4.0)
MCH: 32.1 pg (ref 26.0–34.0)
MCHC: 33.5 g/dL (ref 30.0–36.0)
MCV: 95.8 fL (ref 80.0–100.0)
Monocytes Absolute: 0.8 10*3/uL (ref 0.1–1.0)
Monocytes Relative: 7 %
Neutro Abs: 8.5 10*3/uL — ABNORMAL HIGH (ref 1.7–7.7)
Neutrophils Relative %: 76 %
Platelets: 245 10*3/uL (ref 150–400)
RBC: 2.87 MIL/uL — ABNORMAL LOW (ref 3.87–5.11)
RDW: 13.8 % (ref 11.5–15.5)
WBC: 11.1 10*3/uL — ABNORMAL HIGH (ref 4.0–10.5)
nRBC: 0.2 % (ref 0.0–0.2)

## 2021-07-06 LAB — COMPREHENSIVE METABOLIC PANEL
ALT: 24 U/L (ref 0–44)
AST: 21 U/L (ref 15–41)
Albumin: 2.5 g/dL — ABNORMAL LOW (ref 3.5–5.0)
Alkaline Phosphatase: 51 U/L (ref 38–126)
Anion gap: 6 (ref 5–15)
BUN: 27 mg/dL — ABNORMAL HIGH (ref 8–23)
CO2: 21 mmol/L — ABNORMAL LOW (ref 22–32)
Calcium: 8.4 mg/dL — ABNORMAL LOW (ref 8.9–10.3)
Chloride: 116 mmol/L — ABNORMAL HIGH (ref 98–111)
Creatinine, Ser: 1.11 mg/dL — ABNORMAL HIGH (ref 0.44–1.00)
GFR, Estimated: 48 mL/min — ABNORMAL LOW (ref >60)
Glucose, Bld: 171 mg/dL — ABNORMAL HIGH (ref 70–99)
Potassium: 4.4 mmol/L (ref 3.5–5.1)
Sodium: 143 mmol/L (ref 135–145)
Total Bilirubin: 0.7 mg/dL (ref 0.3–1.2)
Total Protein: 5.7 g/dL — ABNORMAL LOW (ref 6.5–8.1)

## 2021-07-06 LAB — CULTURE, BLOOD (ROUTINE X 2)
Culture: NO GROWTH
Culture: NO GROWTH
Special Requests: ADEQUATE

## 2021-07-06 LAB — C-REACTIVE PROTEIN: CRP: 1 mg/dL — ABNORMAL HIGH (ref <1.0)

## 2021-07-06 LAB — D-DIMER, QUANTITATIVE: D-Dimer, Quant: 3.75 ug/mL-FEU — ABNORMAL HIGH (ref 0.00–0.50)

## 2021-07-06 LAB — FERRITIN: Ferritin: 683 ng/mL — ABNORMAL HIGH (ref 11–307)

## 2021-07-06 LAB — MAGNESIUM: Magnesium: 2.3 mg/dL (ref 1.7–2.4)

## 2021-07-06 LAB — PHOSPHORUS: Phosphorus: 1.9 mg/dL — ABNORMAL LOW (ref 2.5–4.6)

## 2021-07-06 MED ORDER — CEFDINIR 300 MG PO CAPS: 300.0000 mg | ORAL_CAPSULE | Freq: Two times a day (BID) | ORAL | 0 refills | Status: DC

## 2021-07-06 MED ORDER — CEFDINIR 300 MG PO CAPS
300.0000 mg | ORAL_CAPSULE | Freq: Two times a day (BID) | ORAL | Status: DC
  Administered 2021-07-06: 300 mg via ORAL
  Filled 2021-07-06: qty 1

## 2021-07-06 MED ORDER — AZITHROMYCIN 500 MG PO TABS: 500.0000 mg | ORAL_TABLET | Freq: Every day | ORAL | 0 refills | Status: DC

## 2021-07-06 NOTE — Progress Notes (Signed)
07/06/2021 2030:  Pt discharged home with daughter. AVS given to daughter and pt. Belongings in hand.

## 2021-07-06 NOTE — Progress Notes (Addendum)
Patient's daughter Karlisa Gaubert at bedside.  Aware of patient being discharged today.  Daughter said she will call the nursing station back when she gets a ride for the patient.

## 2021-07-06 NOTE — TOC Transition Note (Signed)
Transition of Care [facility]) - CM/SW Discharge Note   Patient Details  Name: Laura Davenport MRN: [id number] Date of Birth: 10-23-1930  [facility] of [facility]) CM/SW Contact:  [name], RN Phone Number: 07/06/2021, 11:11 AM   Clinical Narrative:   Patient's family refused SNF, discharging home today with [facility] home health. Orders have been placed, Penni Homans updated.   Final next level of care: [place] Barriers to Discharge: Barriers Resolved  Patient Goals and CMS Choice Patient states their goals for this hospitalization and ongoing recovery are:: to get better. CMS Medicare.gov Compare Post Acute Care list provided to:: Patient Represenative (must comment) Choice offered to / list presented to : Adult Children  Discharge Placement         Patient and family notified of of transfer: 07/06/21  Discharge Plan and [place] with home health            Readmission Risk Interventions Readmission Risk Prevention Plan 07/04/2021 02/12/2019 02/12/2019  Post Dischage Appt - - -  Medication Screening - - -  Transportation Screening Complete - -  PCP or Specialist Appt within 5-7 Days - Complete Complete  Home Care Screening Complete Complete -  Medication Review (RN CM) Complete - -  Some recent data might be hidden

## 2021-07-06 NOTE — Progress Notes (Signed)
Called patient's daughter to check on patient's ride home.  According to daughter Lindsey Demonte, ride will be available at 8 pm.  Charge RN made aware.

## 2021-07-06 NOTE — Progress Notes (Signed)
Physical Therapy Treatment Patient Details Name: Laura Davenport MRN: [id number] DOB: 04-23-1931 Today's Date: 07/06/2021   History of Present Illness Laura Davenport is a 87 y.o. female with medical history significant for CKD 3, atrial fibrillation, dementia, diabetes mellitus, hypertension.  Patient was brought to the ED via EMS reports of altered mental status, also reported patient tested positive for COVID on the 19th of this month.  And since positive COVID test patient has not been doing well.  On the time of my evaluation, patient is alone in the room, she is talking but it is incomprehensible.   Spoke to patient's daughter on the phone- Laura Davenport, who is patient's primary decision maker.  She reports at baseline, patient most times can feed herself, mostly recognizes family, ambulates with a wheelchair, on several occasions her speech would not make sense, but she is able to answer simple questions.  Daughter reports over the past week, patient has barely eaten or drunk anything, was not as alert as usual.  Patient has had a mild cough.  No difficulty breathing.    PT Comments    Patient agreeable for therapy and patient's daughter at bedside.  Patient demonstrates slow labored movement for sitting up at bedside with fair/good return for pulling self to sitting requiring frequent verbal/tactile cueing, poor carryover for using RW to attempt sit to stands/transfers due to apprehension and required Max assist stand pivot with knees blocked to transfer to chair.  Patient able to cooperate with completing BLE exercises with active assistance and constant verbal cueing.  Patient tolerated sitting up in chair after therapy with her daughter present in room to help feed patient breakfast.  Patient will benefit from continued physical therapy in hospital and recommended venue below to increase strength, balance, endurance for safe ADLs and gait.      Recommendations for follow up therapy are one  component of a multi-disciplinary discharge planning process, led by the attending physician.  Recommendations may be updated based on patient status, additional functional criteria and insurance authorization.  Follow Up Recommendations  Skilled nursing-short term rehab (<3 hours/day)     Assistance Recommended at Discharge Intermittent Supervision/Assistance  Equipment Recommendations  None recommended by PT    Recommendations for Other Services       Precautions / Restrictions Precautions Precautions: Fall Restrictions Weight Bearing Restrictions: No     Mobility  Bed Mobility Overal bed mobility: Needs Assistance Bed Mobility: Supine to Sit     Supine to sit: Mod assist     General bed mobility comments: increased time, labored movement    Transfers Overall transfer level: Needs assistance Equipment used: Rolling walker (2 wheels) Transfers: Sit to/from Stand;Stand Pivot Transfers Sit to Stand: Max assist Stand pivot transfers: Max assist         General transfer comment: very unsteady on feet, poor carryover for attempting to transfer using RW, required stand pivot with knees blocked to transfer to chair    Ambulation/Gait                 Stairs             Wheelchair Mobility    Modified Rankin (Stroke Patients Only)       Balance Overall balance assessment: Needs assistance Sitting-balance support: Feet supported;No upper extremity supported Sitting balance-Leahy Scale: Fair Sitting balance - Comments: fair/good seated at EOB   Standing balance support: During functional activity;No upper extremity supported Standing balance-Leahy Scale: Poor Standing balance comment:  hand held assist                            Cognition Arousal/Alertness: Awake/alert Behavior During Therapy: [facility] for tasks assessed/performed;Anxious Overall Cognitive Status: History of cognitive impairments - at baseline                                           Exercises General Exercises - Lower Extremity Ankle Circles/Pumps: Seated;AROM;Strengthening;Both;10 reps Long Arc Quad: Seated;AAROM;Strengthening;AROM;Both;10 reps Hip Flexion/Marching: Seated;AAROM;Strengthening;Both;10 reps    General Comments        Pertinent Vitals/Pain Pain Assessment: No/denies pain    Home Living                          Prior Function            PT Goals (current goals can now be found in the care plan section) Acute Rehab PT Goals Patient Stated Goal: return home PT Goal Formulation: With patient Time For Goal Achievement: 07/18/21 Potential to Achieve Goals: Good Progress towards PT goals: Progressing toward goals    Frequency    Min 3X/week      PT Plan Current plan remains appropriate    Co-evaluation              AM-PAC PT "6 Clicks" Mobility   Outcome Measure  Help needed turning from your back to your side while in a flat bed without using bedrails?: A Little Help needed moving from lying on your back to sitting on the side of a flat bed without using bedrails?: A Lot Help needed moving to and from a bed to a chair (including a wheelchair)?: A Lot Help needed standing up from a chair using your arms (e.g., wheelchair or bedside chair)?: A Lot Help needed to walk in hospital room?: Total Help needed climbing 3-5 steps with a railing? : Total 6 Click Score: 11    End of Session   Activity Tolerance: Patient tolerated treatment well;Patient limited by fatigue Patient left: in chair;with call bell/phone within reach;with chair alarm set;with family/visitor present Nurse Communication: Mobility status PT Visit Diagnosis: Unsteadiness on feet (R26.81);Other abnormalities of gait and mobility (R26.89);Muscle weakness (generalized) (M62.81)     Time: 3220-2542 PT Time Calculation (min) (ACUTE ONLY): 23 min  Charges:  $Therapeutic Exercise: 8-22 mins $Therapeutic Activity: 8-22  mins                     10:34 AM, 07/06/21 Lonell Grandchild, MPT Physical Therapist with [facility] [phone] office [phone] mobile phone

## 2021-07-06 NOTE — Discharge Summary (Signed)
Physician Discharge Summary  EVANNE MATSUNAGA WVP:[id number] DOB: Nov 05, 1930 DOA: 07/01/2021  PCP: [name], MD  Admit date: 07/01/2021 Discharge date: 07/06/2021  Admitted From: home Disposition:  Home   Recommendations for Outpatient Follow-up:  Follow up with PCP in 1-2 weeks Please obtain BMP/CBC in one week   Home Health:[facility]   Discharge Condition: Stable CODE STATUS:DNR Diet recommendation:Regular   Brief/Interim Summary: As per H&P written by Dr. Denton Brick on 07/01/2021 Margaretha Seeds is a 87 y.o. female with medical history significant for CKD 3, atrial fibrillation, dementia, diabetes mellitus, hypertension. Patient was brought to the ED via EMS reports of altered mental status, also reported patient tested positive for COVID on the 19th of this month.  And since positive COVID test patient has not been doing well.  She did have some functional improvement for a short time, but for past 2-3 days, she has declined functionally and cognitively.  On the time of Dr. Talmadge Coventry evaluation, patient is alone in the room, she is talking but it is incomprehensible.  Dr. Hassell Done to patient's daughter on the phone- Arwilda [place], who is patient's primary decision maker.  She reports at baseline, patient most times can feed herself, mostly recognizes family, is wheelchair bound, on several occasions her speech would not make sense, but she is able to answer simple questions. Daughter reports over the past week, patient has barely eaten or drunk anything, was not as alert as usual.  Patient has had a mild cough.  No difficulty breathing.     Patient did not receive any of the COVID vaccines, and no known prior COVID-19 infection.  Discharge Diagnoses:  Acute metabolic encephalopathy -multifactorial including recent COVID-19, new bacterial pneumonia, AKI, and UTI -overall alert, remains pleasantly confused -occasionally answers yes/no, otherwise incomprehensible  speech -11/4--more alert; daughter states pt is near baseline mental status   Lobar pneumonia -superimposed bacterial infection. -restart ceftriaxone and azithromycin -Patient is not hypoxic and completely out of the window for therapeutic intervention using remdesivir or Plaxlovid  -Procalcitonin 1.51>>1.04 -WBCs slightly elevated currently in the setting of his steroid usage. -Patient has remained afebrile. -blood cultures neg -urine culture neg, but pt was already on abx -Continue to follow inflammatory markers. -CRP 16.2>>7.9>.3.9>>1.9>>1.0   acute on chronic renal failure: Stage IIIb at baseline -baseline creatinine 1.2-1.6 -serum creatinine peaked 2.00 -Avoid nephrotoxic agent -improved with IVF -serum creatinine 1.11 on day of d/c   dehydration/hyponatremia -Improved with fluid resuscitation -Continue to follow electrolytes trend     Essential hypertension -Holding antihypertensive agents currently in the setting of hypotension initially -Continue to follow vital signs -BP remains well controlled off of antiHTN meds -remain off of losartan and diltiazem   Dementia ([place]) -Continue supportive care -Continue the use of donepezil -No behavioral disturbances currently appreciated. -continues to have poor po intake despite assistance   anemia of chronic disease -baseline Hgb 10-11 -Continue patient follow-up with hematology.   history of PSVT -Continue outpatient follow-up with cardiology service -Currently holding cartia xr -in the setting of soft blood pressure. -Continue telemetry monitoring.   -no SVT off diltiazem--will not restart  Discharge Instructions   Allergies as of 07/06/2021       Reactions   Oxycodone Hives        Medication List     STOP taking these medications    Cartia XT 240 MG 24 hr capsule Generic drug: diltiazem   losartan 100 MG tablet Commonly known as: COZAAR  TAKE these medications    acetaminophen 500 MG  tablet Commonly known as: TYLENOL Take 500 mg by mouth every 6 (six) hours as needed.   aspirin 81 MG EC tablet Take 1 tablet (81 mg total) by mouth daily.   azithromycin 500 MG tablet Commonly known as: ZITHROMAX Take 1 tablet (500 mg total) by mouth daily.   cefdinir 300 MG capsule Commonly known as: OMNICEF Take 1 capsule (300 mg total) by mouth every 12 (twelve) hours.   donepezil 10 MG tablet Commonly known as: ARICEPT Take 10 mg by mouth at bedtime.   pantoprazole 40 MG tablet Commonly known as: PROTONIX TAKE 1 TABLET(40 MG) BY MOUTH DAILY BEFORE BREAKFAST   vitamin B-12 250 MCG tablet Commonly known as: CYANOCOBALAMIN Take 250 mcg by mouth daily.        Allergies  Allergen Reactions   Oxycodone Hives    Consultations: palliative   Procedures/Studies: DG Chest Port 1 View  Result Date: 07/01/2021 CLINICAL DATA:  Altered mental status. EXAM: PORTABLE CHEST 1 VIEW COMPARISON:  06/19/2021 FINDINGS: New patchy ill-defined opacities overlying the mid and UPPER LATERAL RIGHT lung noted. The cardiomediastinal silhouette is unchanged. No pleural effusion or pneumothorax. Mild chronic LEFT lung opacities are again identified. No acute bony abnormalities are noted. IMPRESSION: New patchy ill-defined opacities overlying the mid and UPPER RIGHT lung suspicious for airspace disease/pneumonia. Electronically Signed   By: Margarette Canada M.D.   On: 07/01/2021 17:38   DG Chest Port 1 View  Result Date: 06/19/2021 CLINICAL DATA:  Possible sepsis EXAM: PORTABLE CHEST 1 VIEW COMPARISON:  02/09/2019 chest x-ray 03/24/2017 FINDINGS: The heart size and mediastinal contours are within normal limits. Aortic atherosclerosis. Both lungs are clear. Chronic fracture deformity at the right humeral neck. IMPRESSION: No active disease. Electronically Signed   By: Donavan Foil M.D.   On: 06/19/2021 19:05        Discharge Exam: Vitals:   07/05/21 2123 07/06/21 0440  BP: 134/75 133/73   Pulse: 85 72  Resp: 16 18  Temp: 97.7 F (36.5 C) 98.3 F (36.8 C)  SpO2: 100% 100%   Vitals:   07/05/21 0323 07/05/21 1300 07/05/21 2123 07/06/21 0440  BP: 122/76 118/88 134/75 133/73  Pulse: 85 77 85 72  Resp: 19 16 16 18   Temp: 98.2 F (36.8 C) 97.8 F (36.6 C) 97.7 F (36.5 C) 98.3 F (36.8 C)  TempSrc:   Oral   SpO2: 100% 98% 100% 100%  Weight:      Height:        General: Pt is alert, awake, not in acute distress Cardiovascular: RRR, S1/S2 +, no rubs, no gallops Respiratory: bibasilar rales.  No wheeze Abdominal: Soft, NT, ND, bowel sounds + Extremities: no edema, no cyanosis   The results of significant diagnostics from this hospitalization (including imaging, microbiology, ancillary and laboratory) are listed below for reference.    Significant Diagnostic Studies: DG Chest Port 1 View  Result Date: 07/01/2021 CLINICAL DATA:  Altered mental status. EXAM: PORTABLE CHEST 1 VIEW COMPARISON:  06/19/2021 FINDINGS: New patchy ill-defined opacities overlying the mid and UPPER LATERAL RIGHT lung noted. The cardiomediastinal silhouette is unchanged. No pleural effusion or pneumothorax. Mild chronic LEFT lung opacities are again identified. No acute bony abnormalities are noted. IMPRESSION: New patchy ill-defined opacities overlying the mid and UPPER RIGHT lung suspicious for airspace disease/pneumonia. Electronically Signed   By: Margarette Canada M.D.   On: 07/01/2021 17:38   DG Chest [facility]  Result Date: 06/19/2021 CLINICAL DATA:  Possible sepsis EXAM: PORTABLE CHEST 1 VIEW COMPARISON:  02/09/2019 chest x-ray 03/24/2017 FINDINGS: The heart size and mediastinal contours are within normal limits. Aortic atherosclerosis. Both lungs are clear. Chronic fracture deformity at the right humeral neck. IMPRESSION: No active disease. Electronically Signed   By: Donavan Foil M.D.   On: 06/19/2021 19:05    Microbiology: Recent Results (from the past 240 hour(s))  Blood Culture  (routine x 2)     Status: None   Collection Time: 07/01/21  5:17 PM   Specimen: BLOOD RIGHT ARM  Result Value Ref Range Status   Specimen Description   Final    BLOOD RIGHT ARM BOTTLES DRAWN AEROBIC AND ANAEROBIC   Special Requests Blood Culture adequate volume  Final   Culture   Final    NO GROWTH 5 DAYS Performed at [facility], [address] [place], [place] [address]    Report Status 07/06/2021 FINAL  Final  Blood Culture (routine x 2)     Status: None   Collection Time: 07/01/21  5:18 PM   Specimen: Left Antecubital; Blood  Result Value Ref Range Status   Specimen Description   Final    LEFT ANTECUBITAL BOTTLES DRAWN AEROBIC AND ANAEROBIC   Special Requests   Final    Blood Culture results may not be optimal due to an inadequate volume of blood received in culture bottles   Culture   Final    NO GROWTH 5 DAYS Performed at [facility], [address] [place], [place] [address]    Report Status 07/06/2021 FINAL  Final  Resp Panel by RT-PCR (Flu A&B, Covid) Nasopharyngeal Swab     Status: Abnormal   Collection Time: 07/01/21  5:20 PM   Specimen: Nasopharyngeal Swab; Nasopharyngeal(NP) swabs in vial transport medium  Result Value Ref Range Status   SARS Coronavirus 2 by RT PCR POSITIVE (A) NEGATIVE Final    Comment: CRITICAL RESULT CALLED TO, READ BACK BY AND VERIFIED WITH: NICHOLS,K 2007 07/01/2021 COLEMAN,R (NOTE) SARS-CoV-2 target nucleic acids are DETECTED.  The SARS-CoV-2 RNA is generally detectable in upper respiratory specimens during the acute phase of infection. Positive results are indicative of the presence of the identified virus, but do not rule out bacterial infection or co-infection with other pathogens not detected by the test. Clinical correlation with patient history and other diagnostic information is necessary to determine patient infection status. The expected result is Negative.  Fact Sheet for  Patients: EntrepreneurPulse.com.au  Fact Sheet for Healthcare Providers: IncredibleEmployment.be  This test is not yet approved or cleared by the [place] FDA and  has been authorized for detection and/or diagnosis of SARS-CoV-2 by FDA under an Emergency Use Authorization (EUA).  This EUA will remain in effect (meaning this  test can be used) for the duration of  the COVID-19 declaration under Section 564(b)(1) of the Act, 21 U.S.C. section 360bbb-3(b)(1), unless the authorization is terminated or revoked sooner.     Influenza A by PCR NEGATIVE NEGATIVE Final   Influenza B by PCR NEGATIVE NEGATIVE Final    Comment: (NOTE) The Xpert Xpress SARS-CoV-2/FLU/RSV plus assay is intended as an aid in the diagnosis of influenza from Nasopharyngeal swab specimens and should not be used as a sole basis for treatment. Nasal washings and aspirates are unacceptable for Xpert Xpress SARS-CoV-2/FLU/RSV testing.  Fact Sheet for Patients: EntrepreneurPulse.com.au  Fact Sheet for Healthcare Providers: IncredibleEmployment.be  This test is not yet approved or cleared by the [place]  States FDA and has been authorized for detection and/or diagnosis of SARS-CoV-2 by FDA under an Emergency Use Authorization (EUA). This EUA will remain in effect (meaning this test can be used) for the duration of the COVID-19 declaration under Section 564(b)(1) of the Act, 21 U.S.C. section 360bbb-3(b)(1), unless the authorization is terminated or revoked.  Performed at [facility], [address] [place], [place] [address]   Urine Culture     Status: None   Collection Time: 07/04/21 12:59 PM   Specimen: Urine, Catheterized  Result Value Ref Range Status   Specimen Description   Final    URINE, CATHETERIZED Performed at [facility], [address], [place], [place] [address]    Special Requests   Final    NONE Performed at [facility], [address], [place], [place] [address]    Culture   Final    NO GROWTH Performed at [facility], [place] [address], [place], [place] [address]    Report Status 07/05/2021 FINAL  Final     Labs: Basic Metabolic Panel: Recent Labs  Lab 07/02/21 0503 07/03/21 0630 07/04/21 0547 07/05/21 0556 07/06/21 0526  NA 138 141 143 144 143  K 4.4 4.0 4.3 4.6 4.4  CL 109 113* 115* 117* 116*  CO2 20* 18* 19* 19* 21*  GLUCOSE 157* 184* 187* 198* 171*  BUN 42* 40* 36* 34* 27*  CREATININE 1.53* 1.40* 1.42* 1.19* 1.11*  CALCIUM 7.5* 7.9* 8.5* 8.4* 8.4*  MG 2.3 2.5* 2.5* 2.5* 2.3  PHOS 3.6 3.1 3.0 2.1* 1.9*   Liver Function Tests: Recent Labs  Lab 07/02/21 0503 07/03/21 0630 07/04/21 0547 07/05/21 0556 07/06/21 0526  AST 34 26 27 23 21   ALT 20 22 23 24 24   ALKPHOS 57 53 52 53 51  BILITOT 0.7 0.7 0.5 0.6 0.7  PROT 6.0* 5.9* 6.0* 5.7* 5.7*  ALBUMIN 2.2* 2.4* 2.5* 2.5* 2.5*   No results for input(s): LIPASE, AMYLASE in the last 168 hours. No results for input(s): AMMONIA in the last 168 hours. CBC: Recent Labs  Lab 07/02/21 0503 07/03/21 0630 07/04/21 0547 07/05/21 0556 07/06/21 0645  WBC 7.5 11.6* 11.9* 10.1 11.1*  NEUTROABS 6.7 9.7* 10.4* 8.9* 8.5*  HGB 10.4* 9.9* 9.3* 9.3* 9.2*  HCT 30.4* 29.8* 27.8* 28.1* 27.5*  MCV 98.4 96.8 97.5 98.3 95.8  PLT 185 244 279 275 245   Cardiac Enzymes: No results for input(s): CKTOTAL, CKMB, CKMBINDEX, TROPONINI in the last 168 hours. BNP: Invalid input(s): POCBNP CBG: No results for input(s): GLUCAP in the last 168 hours.  Time coordinating discharge:  36 minutes  Signed:  Orson Eva, DO [facility] Pager: [phone] 07/06/2021, 10:19 AM

## 2021-07-10 DIAGNOSIS — D649 Anemia, unspecified: Secondary | ICD-10-CM | POA: Diagnosis not present

## 2021-07-10 DIAGNOSIS — I10 Essential (primary) hypertension: Secondary | ICD-10-CM | POA: Diagnosis not present

## 2021-07-10 DIAGNOSIS — E119 Type 2 diabetes mellitus without complications: Secondary | ICD-10-CM | POA: Diagnosis not present

## 2021-07-10 DIAGNOSIS — N1832 Chronic kidney disease, stage 3b: Secondary | ICD-10-CM | POA: Diagnosis not present

## 2021-07-10 DIAGNOSIS — J1282 Pneumonia due to coronavirus disease 2019: Secondary | ICD-10-CM | POA: Diagnosis not present

## 2021-07-10 DIAGNOSIS — Z743 Need for continuous supervision: Secondary | ICD-10-CM | POA: Diagnosis not present

## 2021-07-10 DIAGNOSIS — M6281 Muscle weakness (generalized): Secondary | ICD-10-CM | POA: Diagnosis not present

## 2021-07-10 DIAGNOSIS — U071 COVID-19: Secondary | ICD-10-CM | POA: Diagnosis not present

## 2021-07-10 DIAGNOSIS — I4891 Unspecified atrial fibrillation: Secondary | ICD-10-CM | POA: Diagnosis not present

## 2021-07-11 ENCOUNTER — Other Ambulatory Visit: Payer: Self-pay | Admitting: *Deleted

## 2021-07-11 ENCOUNTER — Encounter: Payer: Self-pay | Admitting: *Deleted

## 2021-07-12 DIAGNOSIS — D649 Anemia, unspecified: Secondary | ICD-10-CM | POA: Diagnosis not present

## 2021-07-12 DIAGNOSIS — I4891 Unspecified atrial fibrillation: Secondary | ICD-10-CM | POA: Diagnosis not present

## 2021-07-12 DIAGNOSIS — U071 COVID-19: Secondary | ICD-10-CM | POA: Diagnosis not present

## 2021-07-12 DIAGNOSIS — I10 Essential (primary) hypertension: Secondary | ICD-10-CM | POA: Diagnosis not present

## 2021-07-12 DIAGNOSIS — N1832 Chronic kidney disease, stage 3b: Secondary | ICD-10-CM | POA: Diagnosis not present

## 2021-07-12 DIAGNOSIS — Z743 Need for continuous supervision: Secondary | ICD-10-CM | POA: Diagnosis not present

## 2021-07-12 DIAGNOSIS — E119 Type 2 diabetes mellitus without complications: Secondary | ICD-10-CM | POA: Diagnosis not present

## 2021-07-12 DIAGNOSIS — J1282 Pneumonia due to coronavirus disease 2019: Secondary | ICD-10-CM | POA: Diagnosis not present

## 2021-07-12 DIAGNOSIS — M6281 Muscle weakness (generalized): Secondary | ICD-10-CM | POA: Diagnosis not present

## 2021-07-12 NOTE — Patient Outreach (Signed)
[facility]) Care Management  07/12/2021  Laura Davenport 05/20/1931 [id number]  [facility]) Care Management Geriatric Nurse Practitioner Note   07/12/2021 Name:  Laura Davenport MRN:  [id number] DOB:  [birth year]  Subjective: Laura Davenport is an 87 y.o. year old female who is a primary patient of [name], MD. The care management team was consulted for assistance with care management and/or care coordination needs.  Pt recently had an ED visit for [place] and subsequent admission for AKI  Geriatric Nurse Practitioner completed Telephone Visit today.   Active Ambulatory Problems    Diagnosis Date Noted   Diabetes mellitus without complication ([place])    Hypertension    HLD (hyperlipidemia)    Depression    Fever 12/22/2014   History of fractured kneecap 12/22/2014   Knee fracture, left 12/22/2014   Sepsis ([place]) 12/22/2014   Tachycardia with 141 - 160 beats per minute 12/22/2014   Essential hypertension    Septic joint of left knee joint ([place])    Blood poisoning    AKI (acute kidney injury) ([place])    Bacteremia    Atrial fibrillation and flutter ([place])    AP (abdominal pain)    Hematemesis without nausea    Dysphagia 03/13/2015   B12 deficiency anemia 10/25/2015   Chronic renal disease, stage 3, moderately decreased glomerular filtration rate (GFR) between 30-59 mL/min/1.73 square meter (HCC) 01/07/2016   Anemia in chronic kidney disease 01/20/2016   Altered mental status 03/24/2017   GERD (gastroesophageal reflux disease) 12/21/2018   Constipation 12/21/2018   Chest pain on exertion 02/01/2019   Dementia ([place]) 02/01/2019   Sepsis secondary to UTI ([place]) 02/09/2019   Acute renal failure superimposed on stage 3 chronic kidney disease ([place]) 02/09/2019   Sepsis due to gram-negative UTI ([place]) 02/11/2019   Acute cystitis without hematuria    Generalized weakness    Pneumonia due to COVID-19 virus 84/53/6468   Acute metabolic encephalopathy  11/21/2246   Lobar pneumonia ([place]) 07/04/2021   Resolved Ambulatory Problems    Diagnosis Date Noted   No Resolved Ambulatory Problems   Past Medical History:  Diagnosis Date   GI bleed    Hyperlipidemia    PSVT (paroxysmal supraventricular tachycardia) (HCC)    Septic arthritis ([place])    Type 2 diabetes mellitus ([place])      Medications Reviewed Today     Reviewed by Loa Socks, RN (Registered Nurse) on 07/02/21 at 1819  Med List Status: Complete   Medication Order Taking? Sig Documenting Provider Last Dose Status Informant  acetaminophen (TYLENOL) 500 MG tablet [id number] Yes Take 500 mg by mouth every 6 (six) hours as needed. [provider] Past Week Active Child  aspirin EC 81 MG EC tablet [id number] Yes Take 1 tablet (81 mg total) by mouth daily. Wynetta Emery, Clanford L, MD 06/28/2021 0900 Active Child  CARTIA XT 240 MG 24 hr capsule [id number] Yes Take 240 mg by mouth daily.  [provider] Past Week Active Child  donepezil (ARICEPT) 10 MG tablet [id number] Yes Take 10 mg by mouth at bedtime.  [provider] Past Week Active Child  losartan (COZAAR) 100 MG tablet [id number] Yes Take 100 mg by mouth daily.  [provider] Past Week Active Child  pantoprazole (PROTONIX) 40 MG tablet [id number] Yes TAKE 1 TABLET(40 MG) BY MOUTH DAILY BEFORE BREAKFAST [name], NP Past Week Active Child  vitamin B-12 (CYANOCOBALAMIN) 250 MCG tablet [id number] Yes Take 250 mcg by  mouth daily. [provider] Past Week Active Child             SDOH:  (Social Determinants of Health) assessments and interventions performed:  SDOH Interventions    Flowsheet Row Most Recent Value  SDOH Interventions   Food Insecurity Interventions Intervention Not Indicated  Financial Strain Interventions Intervention Not Indicated  Housing Interventions Intervention Not Indicated  Intimate Partner Violence Interventions Intervention Not Indicated  Physical  Activity Interventions Other (Comments)  [Getting PT]  Stress Interventions Intervention Not Indicated  Social Connections Interventions Intervention Not Indicated  [Pt has 9 living children and dozens of grands that are in and out all the time.]  Transportation Interventions Intervention Not Indicated       Care Plan  Review of patient past medical history, allergies, medications, health status, including review of consultants reports, laboratory and other test data, was performed as part of comprehensive evaluation for care management services.   Care Plan : RN Care Manager Plan of Care  Updates made by Deloria Lair, NP since 07/12/2021 12:00 AM     Problem: High risk for falls and weight maintenance   Priority: High  Onset Date: 07/12/2021     Long-Range Goal: High risk for falls and weight maintenance   Start Date: 07/12/2021  Expected End Date: 10/30/2021  Priority: High  Note:   Current Barriers:  Cognitive Deficits  RNCM Clinical Goal(s):  Patient will continue to work with RN Care Manager to address care management and care coordination needs related to  Dementia and safety and wt maintenance. work with PT to improve safety for transferring, ambulating and fall prevention. not experience hospital admission. Hospital Admissions in last 6 months = 1  through collaboration with RN Care manager, provider, and care team.  1 Interventions: Inter-disciplinary care team collaboration (see longitudinal plan of care) Evaluation of current treatment plan related to  self management and patient's adherence to plan as established by provider   Weight Maintenance Interventions:  Provided verbal and/or written education to patient re: provider recommended life style modifications;  Screening for signs and symptoms of depression;  Assessed pt current wt per Epic (121#) Daughter verifies wt loss at least 10#. Appetite improving post acute care. Eating 2 meals and drinking ensure.            Montior weight weekly     Falls Interventions:  Assessed fall risk: HIGH RISK , Current mobility (bed or W/C bound) working with PT to regain safe ambulatory status.  Patient Goals/Self-Care Activities:       Patient will call provider office for new concerns or questions Avoid falls over the next 90 days per report by caregiver each month. Caregiver to assist with weekly weight and recording over the next 3 months. Weight to remain >115# or 5# less than current home weight which is unknown until acquire scales over the next 3 months.  Follow Up Plan:  Telephone follow up appointment with care management team member scheduled for:  1 week. The patient has been provided with contact information for the care management team and has been advised to call with any health related questions or concerns.  The patient will call primary care provider or NP as advised to  .        Plan: Telephone follow up appointment with care management team member scheduled for:  1 week. OF NOTE: COZAAR AND CARTIA XL ARE BEING HELD DUE TO HYPOTENSION.  Eulah Pont. Myrtie Neither, MSN, [facility] Gerontological Nurse Practitioner  [facility] Care Management [phone]

## 2021-07-12 NOTE — Patient Outreach (Signed)
Laura Davenport) Care Management  07/12/2021  LUMA CLOPPER [birth year] [id number]  [facility]) Care Management Geriatric Nurse Practitioner Note   07/12/2021 Name:  Laura Davenport MRN:  [id number] DOB:  [birth year]  Subjective: Laura Davenport is an 87 y.o. year old female who is a primary patient of Laura Noble, MD. The care management team was consulted for assistance with care management and/or care coordination needs.  Pt recently had an ED visit for [place] and subsequent admission for AKI  Geriatric Nurse Practitioner completed Telephone Visit today.   Active Ambulatory Problems    Diagnosis Date Noted   Diabetes mellitus without complication ([place])    Hypertension    HLD (hyperlipidemia)    Depression    Fever 12/22/2014   History of fractured kneecap 12/22/2014   Knee fracture, left 12/22/2014   Sepsis ([place]) 12/22/2014   Tachycardia with 141 - 160 beats per minute 12/22/2014   Essential hypertension    Septic joint of left knee joint ([place])    Blood poisoning    AKI (acute kidney injury) ([place])    Bacteremia    Atrial fibrillation and flutter ([place])    AP (abdominal pain)    Hematemesis without nausea    Dysphagia 03/13/2015   B12 deficiency anemia 10/25/2015   Chronic renal disease, stage 3, moderately decreased glomerular filtration rate (GFR) between 30-59 mL/min/1.73 square meter (HCC) 01/07/2016   Anemia in chronic kidney disease 01/20/2016   Altered mental status 03/24/2017   GERD (gastroesophageal reflux disease) 12/21/2018   Constipation 12/21/2018   Chest pain on exertion 02/01/2019   Dementia ([place]) 02/01/2019   Sepsis secondary to UTI ([place]) 02/09/2019   Acute renal failure superimposed on stage 3 chronic kidney disease ([place]) 02/09/2019   Sepsis due to gram-negative UTI ([place]) 02/11/2019   Acute cystitis without hematuria    Generalized weakness    Pneumonia due to COVID-19 virus 27/51/7001   Acute metabolic encephalopathy  74/94/4967   Lobar pneumonia ([place]) 07/04/2021   Resolved Ambulatory Problems    Diagnosis Date Noted   No Resolved Ambulatory Problems   Past Medical History:  Diagnosis Date   GI bleed    Hyperlipidemia    PSVT (paroxysmal supraventricular tachycardia) (HCC)    Septic arthritis ([place])    Type 2 diabetes mellitus ([place])      Medications Reviewed Today     Reviewed by Loa Socks, RN (Registered Nurse) on 07/02/21 at 1819  Med List Status: Complete   Medication Order Taking? Sig Documenting Provider Last Dose Status Informant  acetaminophen (TYLENOL) 500 MG tablet [id number] Yes Take 500 mg by mouth every 6 (six) hours as needed. [provider] Past Week Active Child  aspirin EC 81 MG EC tablet [id number] Yes Take 1 tablet (81 mg total) by mouth daily. Wynetta Emery, Clanford L, MD 06/28/2021 0900 Active Child  CARTIA XT 240 MG 24 hr capsule [id number] Yes Take 240 mg by mouth daily.  [provider] Past Week Active Child  donepezil (ARICEPT) 10 MG tablet [id number] Yes Take 10 mg by mouth at bedtime.  [provider] Past Week Active Child  losartan (COZAAR) 100 MG tablet [id number] Yes Take 100 mg by mouth daily.  [provider] Past Week Active Child  pantoprazole (PROTONIX) 40 MG tablet [id number] Yes TAKE 1 TABLET(40 MG) BY MOUTH DAILY BEFORE BREAKFAST [name], NP Past Week Active Child  vitamin B-12 (CYANOCOBALAMIN) 250 MCG tablet [id number] Yes Take 250 mcg by  mouth daily. [provider] Past Week Active Child             SDOH:  (Social Determinants of Health) assessments and interventions performed:  SDOH Interventions    Flowsheet Row Most Recent Value  SDOH Interventions   Food Insecurity Interventions Intervention Not Indicated  Financial Strain Interventions Intervention Not Indicated  Housing Interventions Intervention Not Indicated  Intimate Partner Violence Interventions Intervention Not Indicated  Physical  Activity Interventions Other (Comments)  [Getting PT]  Stress Interventions Intervention Not Indicated  Social Connections Interventions Intervention Not Indicated  [Pt has 9 living children and dozens of grands that are in and out all the time.]  Transportation Interventions Intervention Not Indicated       Care Plan  Review of patient past medical history, allergies, medications, health status, including review of consultants reports, laboratory and other test data, was performed as part of comprehensive evaluation for care management services.   Care Plan : RN Care Manager Plan of Care  Updates made by Deloria Lair, NP since 07/12/2021 12:00 AM     Problem: High risk for falls and weight maintenance   Priority: High  Onset Date: 07/12/2021     Long-Range Goal: High risk for falls and weight maintenance   Start Date: 07/12/2021  Expected End Date: 10/30/2021  Priority: High  Note:   Current Barriers:  Cognitive Deficits  RNCM Clinical Goal(s):  Patient will continue to work with RN Care Manager to address care management and care coordination needs related to  Dementia and safety and wt maintenance. work with PT to improve safety for transferring, ambulating and fall prevention. not experience hospital admission. Hospital Admissions in last 6 months = 1  through collaboration with RN Care manager, provider, and care team.  1 Interventions: Inter-disciplinary care team collaboration (see longitudinal plan of care) Evaluation of current treatment plan related to  self management and patient's adherence to plan as established by provider   Weight Maintenance Interventions:  Provided verbal and/or written education to patient re: provider recommended life style modifications;  Screening for signs and symptoms of depression;  Assessed pt current wt per Epic (121#) Daughter verifies wt loss at least 10#. Appetite improving post acute care. Eating 2 meals and drinking ensure.            Montior weight weekly     Falls Interventions:  Assessed fall risk: HIGH RISK , Current mobility (bed or W/C bound) working with PT to regain safe ambulatory status.  Patient Goals/Self-Care Activities:       Patient will call provider office for new concerns or questions Avoid falls over the next 90 days per report by caregiver each month. Caregiver to assist with weekly weight and recording over the next 3 months. Weight to remain >115# or 5# less than current home weight which is unknown until acquire scales over the next 3 months.  Follow Up Plan:  Telephone follow up appointment with care management team member scheduled for:  1 week. The patient has been provided with contact information for the care management team and has been advised to call with any health related questions or concerns.  The patient will call primary care provider or NP as advised to  .        Plan: Telephone follow up appointment with care management team member scheduled for:  1 week. OF NOTE: COZAAR AND CARTIA XL ARE BEING HELD DUE TO HYPOTENSION.  Eulah Pont. Myrtie Neither, MSN, [facility] Gerontological Nurse Practitioner  [facility] Care Management [phone]

## 2021-07-12 NOTE — Patient Instructions (Signed)
Visit Information  PATIENT GOALS/PLAN OF CARE:  Care Plan : RN Care Manager Plan of Care  Updates made by Deloria Lair, NP since 07/12/2021 12:00 AM     Problem: High risk for falls and weight maintenance   Priority: High  Onset Date: 07/12/2021     Long-Range Goal: High risk for falls and weight maintenance   Start Date: 07/12/2021  Expected End Date: 10/30/2021  Priority: High  Note:   Current Barriers:  Cognitive Deficits  RNCM Clinical Goal(s):  Patient will continue to work with RN Care Manager to address care management and care coordination needs related to  Dementia and safety and wt maintenance. work with PT to improve safety for transferring, ambulating and fall prevention. not experience hospital admission. Hospital Admissions in last 6 months = 1  through collaboration with RN Care manager, provider, and care team.  1 Interventions: Inter-disciplinary care team collaboration (see longitudinal plan of care) Evaluation of current treatment plan related to  self management and patient's adherence to plan as established by provider   Weight Maintenance Interventions:  Provided verbal and/or written education to patient re: provider recommended life style modifications;  Screening for signs and symptoms of depression;  Assessed pt current wt per Epic (121#) Daughter verifies wt loss at least 10#. Appetite improving post acute care. Eating 2 meals and drinking ensure.           Montior weight weekly     Falls Interventions:  Assessed fall risk: HIGH RISK , Current mobility (bed or W/C bound) working with PT to regain safe ambulatory status.  Patient Goals/Self-Care Activities:       Patient will call provider office for new concerns or questions Avoid falls over the next 90 days per report by caregiver each month. Caregiver to assist with weekly weight and recording over the next 3 months. Weight to remain >115# or 5# less than current home weight which is unknown  until acquire scales over the next 3 months.  Follow Up Plan:  Telephone follow up appointment with care management team member scheduled for:  1 week. The patient has been provided with contact information for the care management team and has been advised to call with any health related questions or concerns.  The patient will call primary care provider or NP as advised to  .

## 2021-07-18 ENCOUNTER — Other Ambulatory Visit: Payer: Self-pay | Admitting: *Deleted

## 2021-07-18 DIAGNOSIS — U071 COVID-19: Secondary | ICD-10-CM | POA: Diagnosis not present

## 2021-07-18 DIAGNOSIS — I4891 Unspecified atrial fibrillation: Secondary | ICD-10-CM | POA: Diagnosis not present

## 2021-07-18 DIAGNOSIS — Z743 Need for continuous supervision: Secondary | ICD-10-CM | POA: Diagnosis not present

## 2021-07-18 DIAGNOSIS — E119 Type 2 diabetes mellitus without complications: Secondary | ICD-10-CM | POA: Diagnosis not present

## 2021-07-18 DIAGNOSIS — N1832 Chronic kidney disease, stage 3b: Secondary | ICD-10-CM | POA: Diagnosis not present

## 2021-07-18 DIAGNOSIS — M6281 Muscle weakness (generalized): Secondary | ICD-10-CM | POA: Diagnosis not present

## 2021-07-18 DIAGNOSIS — I10 Essential (primary) hypertension: Secondary | ICD-10-CM | POA: Diagnosis not present

## 2021-07-18 DIAGNOSIS — D649 Anemia, unspecified: Secondary | ICD-10-CM | POA: Diagnosis not present

## 2021-07-18 DIAGNOSIS — J1282 Pneumonia due to coronavirus disease 2019: Secondary | ICD-10-CM | POA: Diagnosis not present

## 2021-07-18 NOTE — Patient Outreach (Signed)
[facility]) Care Management  07/18/2021  Laura Davenport November 20, 1930 [id number]  [facility]) Care Management Geriatric Nurse Practitioner Note   07/18/2021 Name:  Laura Davenport MRN:  [id number] DOB:  20-Mar-1931  Summary: Telephone outreach to follow up on pt status with focus on safety and nutrition.  Subjective: Laura Davenport is an 87 y.o. year old female who is a primary patient of [name], MD. The care management team was consulted for assistance with care management and/or care coordination needs.    Geriatric Nurse Practitioner completed Telephone Visit today.   Medications Reviewed Today     Reviewed by Deloria Lair, NP (Nurse Practitioner) on 07/18/21 at 1216  Med List Status: <None>   Medication Order Taking? Sig Documenting Provider Last Dose Status Informant  acetaminophen (TYLENOL) 500 MG tablet [id number] No Take 500 mg by mouth every 6 (six) hours as needed. [provider] Taking Active Child  aspirin EC 81 MG EC tablet [id number] No Take 1 tablet (81 mg total) by mouth daily. Murlean Iba, MD Taking Active Child  azithromycin (ZITHROMAX) 500 MG tablet [id number] No Take 1 tablet (500 mg total) by mouth daily. Orson Eva, MD Taking Active   cefdinir (OMNICEF) 300 MG capsule [id number] No Take 1 capsule (300 mg total) by mouth every 12 (twelve) hours. Orson Eva, MD Taking Active   donepezil (ARICEPT) 10 MG tablet [id number] No Take 10 mg by mouth at bedtime.  [provider] Taking Active Child  pantoprazole (PROTONIX) 40 MG tablet [id number] No TAKE 1 TABLET(40 MG) BY MOUTH DAILY BEFORE BREAKFAST [name], NP Taking Active Child  vitamin B-12 (CYANOCOBALAMIN) 250 MCG tablet [id number] No Take 250 mcg by mouth daily. [provider] Taking Active Child           SDOH:  (Social Determinants of Health) assessments and interventions performed:  Care Plan  Review of patient past medical  history, allergies, medications, health status, including review of consultants reports, laboratory and other test data, was performed as part of comprehensive evaluation for care management services.   Care Plan : RN Care Manager Plan of Care  Updates made by Deloria Lair, NP since 07/18/2021 12:00 AM     Problem: High risk for falls and weight maintenance   Priority: High  Onset Date: 07/12/2021     Long-Range Goal: High risk for falls and weight maintenance   Start Date: 07/12/2021  Expected End Date: 10/30/2021  Priority: High  Note:   Current Barriers:  Cognitive Deficits  RNCM Clinical Goal(s):  Patient will continue to work with RN Care Manager to address care management and care coordination needs related to  Dementia and safety and wt maintenance. work with PT to improve safety for transferring, ambulating and fall prevention. not experience hospital admission. Hospital Admissions in last 6 months = 1  through collaboration with RN Care manager, provider, and care team.   Interventions: Inter-disciplinary care team collaboration (see longitudinal plan of care) Evaluation of current treatment plan related to  self management and patient's adherence to plan as established by provider  Weight Maintenance Interventions:  Assessed pt current wt per Epic (121#) Daughter verifies wt loss at least 10#. Appetite improving post acute care. Eating 2 meals and drinking ensure.           Montior weight weekly  when scale is available and when pt is able to stand on the scale with assistance safely.  Falls Interventions: Provided written  and verbal education re: potential causes of falls and Fall prevention strategies; Reviewed medications and discussed potential side effects of medications such as dizziness and frequent urination;  Patient Goals/Self-Care Activities:        Family with assist pt to  avoid falls over the next 90 days per report by caregiver each month. Caregiver to  assist with weekly weight and recording over the next 3 months. Weight to remain >115# or 5# less than current home weight which is unknown until acquire scales over the next 3 months.  Follow Up Plan:  Telephone follow up appointment with care management team member scheduled for:  1 week. The patient has been provided with contact information for the care management team and has been advised to call with any health related questions or concerns.  The patient will call primary care provider or NP as advised to .Marland Kitchen        Plan: Telephone follow up appointment with care management team member scheduled for:  1 week  Finbar Nippert C. Myrtie Neither, MSN, [facility] Gerontological Nurse Practitioner [facility] Care Management [phone]

## 2021-07-18 NOTE — Patient Instructions (Signed)
Visit Information  PATIENT GOALS/PLAN OF CARE:  Care Plan : RN Care Manager Plan of Care  Updates made by Deloria Lair, NP since 07/18/2021 12:00 AM     Problem: High risk for falls and weight maintenance   Priority: High  Onset Date: 07/12/2021     Long-Range Goal: High risk for falls and weight maintenance   Start Date: 07/12/2021  Expected End Date: 10/30/2021  Priority: High  Note:   Current Barriers:  Cognitive Deficits  RNCM Clinical Goal(s):  Patient will continue to work with RN Care Manager to address care management and care coordination needs related to  Dementia and safety and wt maintenance. work with PT to improve safety for transferring, ambulating and fall prevention. not experience hospital admission. Hospital Admissions in last 6 months = 1  through collaboration with RN Care manager, provider, and care team.   Interventions: Inter-disciplinary care team collaboration (see longitudinal plan of care) Evaluation of current treatment plan related to  self management and patient's adherence to plan as established by provider  Weight Maintenance Interventions:  Assessed pt current wt per Epic (121#) Daughter verifies wt loss at least 10#. Appetite improving post acute care. Eating 2 meals and drinking ensure.           Montior weight weekly  when scale is available and when pt is able to stand on the scale with assistance safely.  Falls Interventions: Provided written and verbal education re: potential causes of falls and Fall prevention strategies; Reviewed medications and discussed potential side effects of medications such as dizziness and frequent urination;  Patient Goals/Self-Care Activities:        Family with assist pt to  avoid falls over the next 90 days per report by caregiver each month. Caregiver to assist with weekly weight and recording over the next 3 months. Weight to remain >115# or 5# less than current home weight which is unknown until acquire  scales over the next 3 months.  Follow Up Plan:  Telephone follow up appointment with care management team member scheduled for:  1 week. The patient has been provided with contact information for the care management team and has been advised to call with any health related questions or concerns.  The patient will call primary care provider or NP as advised to .Marland Kitchen      The patient verbalized understanding of instructions, educational materials, and care plan provided today and declined offer to receive copy of patient instructions, educational materials, and care plan.   Telephone follow up appointment with care management team member scheduled for: 1 week.  Eulah Pont. Myrtie Neither, MSN, [facility] Gerontological Nurse Practitioner [facility] Care Management [phone]

## 2021-07-19 DIAGNOSIS — M6281 Muscle weakness (generalized): Secondary | ICD-10-CM | POA: Diagnosis not present

## 2021-07-19 DIAGNOSIS — Z743 Need for continuous supervision: Secondary | ICD-10-CM | POA: Diagnosis not present

## 2021-07-19 DIAGNOSIS — E119 Type 2 diabetes mellitus without complications: Secondary | ICD-10-CM | POA: Diagnosis not present

## 2021-07-19 DIAGNOSIS — I10 Essential (primary) hypertension: Secondary | ICD-10-CM | POA: Diagnosis not present

## 2021-07-19 DIAGNOSIS — U071 COVID-19: Secondary | ICD-10-CM | POA: Diagnosis not present

## 2021-07-19 DIAGNOSIS — J1282 Pneumonia due to coronavirus disease 2019: Secondary | ICD-10-CM | POA: Diagnosis not present

## 2021-07-19 DIAGNOSIS — I4891 Unspecified atrial fibrillation: Secondary | ICD-10-CM | POA: Diagnosis not present

## 2021-07-19 DIAGNOSIS — D649 Anemia, unspecified: Secondary | ICD-10-CM | POA: Diagnosis not present

## 2021-07-19 DIAGNOSIS — N1832 Chronic kidney disease, stage 3b: Secondary | ICD-10-CM | POA: Diagnosis not present

## 2021-07-23 DIAGNOSIS — I10 Essential (primary) hypertension: Secondary | ICD-10-CM | POA: Diagnosis not present

## 2021-07-23 DIAGNOSIS — I471 Supraventricular tachycardia: Secondary | ICD-10-CM | POA: Diagnosis not present

## 2021-07-24 ENCOUNTER — Other Ambulatory Visit: Payer: Self-pay | Admitting: *Deleted

## 2021-07-24 DIAGNOSIS — N1832 Chronic kidney disease, stage 3b: Secondary | ICD-10-CM | POA: Diagnosis not present

## 2021-07-24 DIAGNOSIS — D649 Anemia, unspecified: Secondary | ICD-10-CM | POA: Diagnosis not present

## 2021-07-24 DIAGNOSIS — I4891 Unspecified atrial fibrillation: Secondary | ICD-10-CM | POA: Diagnosis not present

## 2021-07-24 DIAGNOSIS — U071 COVID-19: Secondary | ICD-10-CM | POA: Diagnosis not present

## 2021-07-24 DIAGNOSIS — M6281 Muscle weakness (generalized): Secondary | ICD-10-CM | POA: Diagnosis not present

## 2021-07-24 DIAGNOSIS — J1282 Pneumonia due to coronavirus disease 2019: Secondary | ICD-10-CM | POA: Diagnosis not present

## 2021-07-24 DIAGNOSIS — Z743 Need for continuous supervision: Secondary | ICD-10-CM | POA: Diagnosis not present

## 2021-07-24 DIAGNOSIS — E119 Type 2 diabetes mellitus without complications: Secondary | ICD-10-CM | POA: Diagnosis not present

## 2021-07-24 DIAGNOSIS — I10 Essential (primary) hypertension: Secondary | ICD-10-CM | POA: Diagnosis not present
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
## 2021-07-30 ENCOUNTER — Other Ambulatory Visit: Payer: Self-pay | Admitting: *Deleted

## 2021-07-30 NOTE — Patient Outreach (Signed)
[facility]) Care Management Geriatric Nurse Practitioner Note   07/30/2021 Name:  Laura Davenport MRN:  [id number] DOB:  [birth year]  Summary: No falls. Weight appears to be going up. Pt is in general cooperative with caregiver.  Recommendations/Changes made from today's visit: See interventions in care plan.   Subjective: Laura Davenport is an 87 y.o. year old female who is a primary patient of [name], MD. The care management team was consulted for assistance with care management and/or care coordination needs.    Geriatric Nurse Practitioner completed Telephone Visit today.   Medications Reviewed Today     Reviewed by Deloria Lair, NP (Nurse Practitioner) on 07/18/21 at 1216  Med List Status: <None>   Medication Order Taking? Sig Documenting Provider Last Dose Status Informant  acetaminophen (TYLENOL) 500 MG tablet [id number] No Take 500 mg by mouth every 6 (six) hours as needed. [provider] Taking Active Child  aspirin EC 81 MG EC tablet [id number] No Take 1 tablet (81 mg total) by mouth daily. Murlean Iba, MD Taking Active Child  azithromycin (ZITHROMAX) 500 MG tablet [id number] No Take 1 tablet (500 mg total) by mouth daily. Orson Eva, MD Taking Active   cefdinir (OMNICEF) 300 MG capsule [id number] No Take 1 capsule (300 mg total) by mouth every 12 (twelve) hours. Orson Eva, MD Taking Active   donepezil (ARICEPT) 10 MG tablet [id number] No Take 10 mg by mouth at bedtime.  [provider] Taking Active Child  pantoprazole (PROTONIX) 40 MG tablet [id number] No TAKE 1 TABLET(40 MG) BY MOUTH DAILY BEFORE BREAKFAST [name], NP Taking Active Child  vitamin B-12 (CYANOCOBALAMIN) 250 MCG tablet [id number] No Take 250 mcg by mouth daily. [provider] Taking Active Child           SDOH:  (Social Determinants of Health) assessments and interventions performed:   Care Plan    Falls Interventions:  (Status:  Goal on  track:  Yes.) Long Term Goal Assessed for falls since last encounter Assessed patients knowledge of fall risk prevention secondary to previously provided education   Weight Loss Interventions:  (Status:  Goal on track:  Yes.) Long Term Goal Encouraged caregiver to have PT assist to weigh pt tomorrow.    Praised caregiver for her optimal nutritional support and management.    Dementia:  (Status:  Goal on track:  Yes.)  Long Term Goal Evaluation of current treatment plan related to: Dementia with mood disturbances Emotional Support Provided to patient/caregiver and continued mental stimulation by ensuring usual life experiences  like watching surrounding out door.   Plan: Telephone follow up appointment with care management team member scheduled for:  1 week.  Eulah Pont. Myrtie Neither, MSN, [facility] Gerontological Nurse Practitioner [facility] Care Management [phone]

## 2021-07-31 DIAGNOSIS — I4891 Unspecified atrial fibrillation: Secondary | ICD-10-CM | POA: Diagnosis not present

## 2021-07-31 DIAGNOSIS — U071 COVID-19: Secondary | ICD-10-CM | POA: Diagnosis not present

## 2021-07-31 DIAGNOSIS — M6281 Muscle weakness (generalized): Secondary | ICD-10-CM | POA: Diagnosis not present

## 2021-07-31 DIAGNOSIS — J1282 Pneumonia due to coronavirus disease 2019: Secondary | ICD-10-CM | POA: Diagnosis not present

## 2021-07-31 DIAGNOSIS — N1832 Chronic kidney disease, stage 3b: Secondary | ICD-10-CM | POA: Diagnosis not present

## 2021-07-31 DIAGNOSIS — E119 Type 2 diabetes mellitus without complications: Secondary | ICD-10-CM | POA: Diagnosis not present

## 2021-07-31 DIAGNOSIS — D649 Anemia, unspecified: Secondary | ICD-10-CM | POA: Diagnosis not present

## 2021-07-31 DIAGNOSIS — I10 Essential (primary) hypertension: Secondary | ICD-10-CM | POA: Diagnosis not present

## 2021-07-31 DIAGNOSIS — Z743 Need for continuous supervision: Secondary | ICD-10-CM | POA: Diagnosis not present

## 2021-08-06 ENCOUNTER — Other Ambulatory Visit: Payer: Self-pay | Admitting: *Deleted

## 2021-08-06 NOTE — Patient Outreach (Addendum)
[facility]) Care Management Geriatric Nurse Practitioner Note   08/06/2021 Name:  Laura Davenport MRN:  [id number] DOB:  1931-01-21  Summary: Daughter reports her mother continues to improve. She is eating well, appears to be gaining some weight and has not had any falls.  Recommendations/Changes made from today's visit: Last transition of care call today. Next call will be in early January. Daughter to call MD or NP if any problems.  Subjective: Laura Davenport is an 87 y.o. year old female who is a primary patient of [name], MD. The care management team was consulted for assistance with care management and/or care coordination needs.    Geriatric Nurse Practitioner completed Telephone Visit today.   Medications Reviewed Today     Reviewed by Deloria Lair, NP (Nurse Practitioner) on 07/18/21 at 1216  Med List Status: <None>   Medication Order Taking? Sig Documenting Provider Last Dose Status Informant  acetaminophen (TYLENOL) 500 MG tablet [id number] No Take 500 mg by mouth every 6 (six) hours as needed. [provider] Taking Active Child  aspirin EC 81 MG EC tablet [id number] No Take 1 tablet (81 mg total) by mouth daily. Murlean Iba, MD Taking Active Child  azithromycin (ZITHROMAX) 500 MG tablet [id number] No Take 1 tablet (500 mg total) by mouth daily. Orson Eva, MD Taking Active   cefdinir (OMNICEF) 300 MG capsule [id number] No Take 1 capsule (300 mg total) by mouth every 12 (twelve) hours. Orson Eva, MD Taking Active   donepezil (ARICEPT) 10 MG tablet [id number] No Take 10 mg by mouth at bedtime.  [provider] Taking Active Child  pantoprazole (PROTONIX) 40 MG tablet [id number] No TAKE 1 TABLET(40 MG) BY MOUTH DAILY BEFORE BREAKFAST [name], NP Taking Active Child  vitamin B-12 (CYANOCOBALAMIN) 250 MCG tablet [id number] No Take 250 mcg by mouth daily. [provider] Taking Active Child           Care  Plan  Review of patient past medical history, allergies, medications, health status, including review of consultants reports, laboratory and other test data, was performed as part of comprehensive evaluation for care management services.     Falls Interventions:  (Status:  Goal on track:  Yes.) Long Term Goal Assessed for falls since last encounter Assessed patients knowledge of fall risk prevention secondary to previously provided education   Weight Loss Interventions:  (Status:  Goal on track:  Yes.) Long Term Goal Continue to encouraged 3 meals a day with snacks and at least 1 protein drink a day Monitor nutritional intake, how clothing is fitting and weigh when people are available to assist for safety.    Plan: Telephone follow up appointment with care management team member scheduled for:  January 6th, 2023  Eulah Pont. Myrtie Neither, MSN, [facility] Gerontological Nurse Practitioner [facility] Care Management [phone]

## 2021-08-07 DIAGNOSIS — Z743 Need for continuous supervision: Secondary | ICD-10-CM | POA: Diagnosis not present

## 2021-08-07 DIAGNOSIS — I4891 Unspecified atrial fibrillation: Secondary | ICD-10-CM | POA: Diagnosis not present

## 2021-08-07 DIAGNOSIS — U071 COVID-19: Secondary | ICD-10-CM | POA: Diagnosis not present

## 2021-08-07 DIAGNOSIS — N1832 Chronic kidney disease, stage 3b: Secondary | ICD-10-CM | POA: Diagnosis not present

## 2021-08-07 DIAGNOSIS — M6281 Muscle weakness (generalized): Secondary | ICD-10-CM | POA: Diagnosis not present

## 2021-08-07 DIAGNOSIS — E119 Type 2 diabetes mellitus without complications: Secondary | ICD-10-CM | POA: Diagnosis not present

## 2021-08-07 DIAGNOSIS — D649 Anemia, unspecified: Secondary | ICD-10-CM | POA: Diagnosis not present

## 2021-08-07 DIAGNOSIS — I10 Essential (primary) hypertension: Secondary | ICD-10-CM | POA: Diagnosis not present

## 2021-08-07 DIAGNOSIS — J1282 Pneumonia due to coronavirus disease 2019: Secondary | ICD-10-CM | POA: Diagnosis not present

## 2021-08-14 DIAGNOSIS — M6281 Muscle weakness (generalized): Secondary | ICD-10-CM | POA: Diagnosis not present

## 2021-08-14 DIAGNOSIS — I4891 Unspecified atrial fibrillation: Secondary | ICD-10-CM | POA: Diagnosis not present

## 2021-08-14 DIAGNOSIS — D649 Anemia, unspecified: Secondary | ICD-10-CM | POA: Diagnosis not present

## 2021-08-14 DIAGNOSIS — J1282 Pneumonia due to coronavirus disease 2019: Secondary | ICD-10-CM | POA: Diagnosis not present

## 2021-08-14 DIAGNOSIS — U071 COVID-19: Secondary | ICD-10-CM | POA: Diagnosis not present

## 2021-08-14 DIAGNOSIS — E119 Type 2 diabetes mellitus without complications: Secondary | ICD-10-CM | POA: Diagnosis not present

## 2021-08-14 DIAGNOSIS — N1832 Chronic kidney disease, stage 3b: Secondary | ICD-10-CM | POA: Diagnosis not present

## 2021-08-14 DIAGNOSIS — I10 Essential (primary) hypertension: Secondary | ICD-10-CM | POA: Diagnosis not present

## 2021-08-14 DIAGNOSIS — Z743 Need for continuous supervision: Secondary | ICD-10-CM | POA: Diagnosis not present

## 2021-09-02 ENCOUNTER — Encounter (HOSPITAL_COMMUNITY): Payer: Self-pay | Admitting: Hematology

## 2021-09-05 NOTE — Progress Notes (Deleted)
NO SHOW

## 2021-09-06 ENCOUNTER — Inpatient Hospital Stay (HOSPITAL_COMMUNITY): Payer: Commercial Managed Care - HMO

## 2021-09-06 ENCOUNTER — Ambulatory Visit (HOSPITAL_COMMUNITY): Payer: Medicare Other | Admitting: Physician Assistant

## 2021-09-06 ENCOUNTER — Inpatient Hospital Stay (HOSPITAL_COMMUNITY): Payer: Commercial Managed Care - HMO | Attending: Hematology

## 2021-09-07 ENCOUNTER — Other Ambulatory Visit: Payer: Self-pay | Admitting: *Deleted

## 2021-09-25 DIAGNOSIS — I10 Essential (primary) hypertension: Secondary | ICD-10-CM | POA: Diagnosis not present

## 2021-09-25 DIAGNOSIS — I471 Supraventricular tachycardia: Secondary | ICD-10-CM | POA: Diagnosis not present

## 2021-09-25 DIAGNOSIS — G309 Alzheimer's disease, unspecified: Secondary | ICD-10-CM | POA: Diagnosis not present

## 2021-09-25 DIAGNOSIS — N184 Chronic kidney disease, stage 4 (severe): Secondary | ICD-10-CM | POA: Diagnosis not present

## 2021-10-04 DIAGNOSIS — E119 Type 2 diabetes mellitus without complications: Secondary | ICD-10-CM | POA: Diagnosis not present

## 2021-10-18 ENCOUNTER — Encounter (HOSPITAL_COMMUNITY): Payer: Self-pay | Admitting: Hematology

## 2021-10-24 NOTE — Progress Notes (Signed)
[place], [place] [address]   CLINIC:  Medical Oncology/Hematology  PCP:  [name], MD [address] [place] [address] [phone]   REASON FOR VISIT:  Follow-up for anemia of CKD and relative iron deficiency  PRIOR THERAPY: Aranesp, as needed IV iron  CURRENT THERAPY: Observation  INTERVAL HISTORY:  Laura Davenport 88 y.o. female returns for routine follow-up of her normocytic anemia.  She was last seen by Tarri Abernethy PA-C on 03/22/2021.  She is accompanied today by her daughter, Laura Davenport.  At today's visit, she reports feeling fairly well.   She was hospitalized from 07/01/2021 through 07/06/2021 for acute metabolic encephalopathy related to COVID-19 and bacterial pneumonia. Since that time she has been doing fairly well.  She denies any current signs or symptoms of bleeding such as epistaxis, melena, or bright red blood per rectum.  She does have some chronic fatigue, but denies chest pain, dyspnea, syncope, or palpitations.  No fever, chills, night sweats, unintentional weight loss.  She has 60% energy and 70% appetite. She endorses that she is maintaining a stable weight.   REVIEW OF SYSTEMS:  Review of Systems  Constitutional:  Positive for appetite change (70% appetite) and fatigue (Energy 60%, chronic). Negative for chills, diaphoresis, fever and unexpected weight change.  HENT:   Positive for trouble swallowing. Negative for lump/mass and nosebleeds.   Eyes:  Negative for eye problems.  Respiratory:  Negative for cough, hemoptysis and shortness of breath.   Cardiovascular:  Negative for chest pain, leg swelling and palpitations.  Gastrointestinal:  Negative for abdominal pain, blood in stool, constipation, diarrhea, nausea and vomiting.  Genitourinary:  Negative for hematuria.   Skin: Negative.   Neurological:  Negative for dizziness, headaches and light-headedness.  Hematological:  Does not bruise/bleed easily.      PAST MEDICAL/SURGICAL HISTORY:  Past Medical History:  Diagnosis Date   Anemia in chronic kidney disease    B12 deficiency anemia    Chronic renal disease, stage 3, moderately decreased glomerular filtration rate (GFR) between 30-59 mL/min/1.73 square meter (HCC)    Dementia (HCC)    Depression    Essential hypertension    GI bleed    Severe gastritis and duodenal ulcers by EGD May 2016    Hyperlipidemia    PSVT (paroxysmal supraventricular tachycardia) ([place])    Septic arthritis ([place])    MRSA, left knee    Type 2 diabetes mellitus ([place])    Past Surgical History:  Procedure Laterality Date   CHOLECYSTECTOMY N/A 11/04/2012   Procedure: LAPAROSCOPIC CHOLECYSTECTOMY;  Surgeon: Jamesetta So, MD;  Location: [facility];  Service: General;  Laterality: N/A;   ESOPHAGOGASTRODUODENOSCOPY N/A 01/06/2015   SLF: mild esophagitis   I & D EXTREMITY Left 12/24/2014   Procedure: IRRIGATION AND DEBRIDEMENT EXTREMITY AND ARTHROSCOPY KNEE;  Surgeon: Renette Butters, MD;  Location: [place];  Service: Orthopedics;  Laterality: Left;   KNEE SURGERY     SAVORY DILATION  01/06/2015   Procedure: SAVORY DILATION;  Surgeon: Danie Binder, MD;  Location: [facility];  Service: Endoscopy;;     SOCIAL HISTORY:  Social History   Socioeconomic History   Marital status: Widowed    Spouse name: Not on file   Number of children: 9   Years of education: Not on file   Highest education level: Not on file  Occupational History   Occupation: retired; Education officer, museum  Tobacco Use   Smoking status: Never  Smokeless tobacco: Never   Tobacco comments:    Never smoked  Substance and Sexual Activity   Alcohol use: No    Alcohol/week: 0.0 standard drinks   Drug use: No   Sexual activity: Not Currently  Other Topics Concern   Not on file  Social History Narrative   Daughter Manuela Schwartz lives in her residence. Daughter Juliannah Ohmann is primary caregiver and decision maker. Sua Spadafora lives in same neighborhood. There are 9  total children and grandchildren and they help out in any way possible.   Social Determinants of Health   Financial Resource Strain: Low Risk    Difficulty of Paying Living Expenses: Not hard at all  Food Insecurity: No Food Insecurity   Worried About Charity fundraiser in the Last Year: Never true   [place] in the Last Year: Never true  Transportation Needs: No Transportation Needs   Lack of Transportation (Medical): No   Lack of Transportation (Non-Medical): No  Physical Activity: Inactive   Days of Exercise per Week: 0 days   Minutes of Exercise per Session: 0 min  Stress: No Stress Concern Present   Feeling of Stress : Not at all  Social Connections: Socially Isolated   Frequency of Communication with Friends and Family: More than three times a week   Frequency of Social Gatherings with Friends and Family: More than three times a week   Attends Religious Services: Never   Marine scientist or Organizations: No   Attends Archivist Meetings: Never   Marital Status: Widowed  Human resources officer Violence: Not At Risk   Fear of Current or Ex-Partner: No   Emotionally Abused: No   Physically Abused: No   Sexually Abused: No    FAMILY HISTORY:  Family History  Family history unknown: Yes    CURRENT MEDICATIONS:  Outpatient Encounter Medications as of 10/25/2021  Medication Sig   acetaminophen (TYLENOL) 500 MG tablet Take 500 mg by mouth every 6 (six) hours as needed.   aspirin EC 81 MG EC tablet Take 1 tablet (81 mg total) by mouth daily.   donepezil (ARICEPT) 10 MG tablet Take 10 mg by mouth at bedtime.    losartan (COZAAR) 100 MG tablet Take 100 mg by mouth daily. Resume per Dr. Willey Blade.   pantoprazole (PROTONIX) 40 MG tablet TAKE 1 TABLET(40 MG) BY MOUTH DAILY BEFORE BREAKFAST   vitamin B-12 (CYANOCOBALAMIN) 250 MCG tablet Take 250 mcg by mouth daily.   No facility-administered encounter medications on file as of 10/25/2021.    ALLERGIES:  Allergies   Allergen Reactions   Oxycodone Hives     PHYSICAL EXAM:  ECOG PERFORMANCE STATUS: 2 - Symptomatic, <50% confined to bed  There were no vitals filed for this visit. There were no vitals filed for this visit. Physical Exam Constitutional:      Appearance: Normal appearance.     Comments: Presents in wheelchair  HENT:     Head: Normocephalic and atraumatic.     Mouth/Throat:     Mouth: Mucous membranes are moist.  Eyes:     Extraocular Movements: Extraocular movements intact.     Pupils: Pupils are equal, round, and reactive to light.  Cardiovascular:     Rate and Rhythm: Regular rhythm. Tachycardia present.     Pulses: Normal pulses.     Heart sounds: Normal heart sounds.     Comments: Heart rate 102 Pulmonary:     Effort: Pulmonary effort is normal.  Breath sounds: Normal breath sounds.  Abdominal:     General: Bowel sounds are normal.     Palpations: Abdomen is soft.     Tenderness: There is no abdominal tenderness.  Musculoskeletal:        General: No swelling.     Right lower leg: No edema.     Left lower leg: No edema.  Lymphadenopathy:     Cervical: No cervical adenopathy.  Skin:    General: Skin is warm and dry.  Neurological:     General: No focal deficit present.     Mental Status: She is alert. Mental status is at baseline.  Psychiatric:        Mood and Affect: Mood normal.        Behavior: Behavior normal.     LABORATORY DATA:  I have reviewed the labs as listed.  CBC    Component Value Date/Time   WBC 11.1 (H) 07/06/2021 0645   RBC 2.87 (L) 07/06/2021 0645   HGB 9.2 (L) 07/06/2021 0645   HCT 27.5 (L) 07/06/2021 0645   PLT 245 07/06/2021 0645   MCV 95.8 07/06/2021 0645   MCH 32.1 07/06/2021 0645   MCHC 33.5 07/06/2021 0645   RDW 13.8 07/06/2021 0645   LYMPHSABS 1.6 07/06/2021 0645   MONOABS 0.8 07/06/2021 0645   EOSABS 0.0 07/06/2021 0645   BASOSABS 0.0 07/06/2021 0645   CMP Latest Ref Rng & Units 07/06/2021 07/05/2021 07/04/2021   Glucose 70 - 99 mg/dL 171(H) 198(H) 187(H)  BUN 8 - 23 mg/dL 27(H) 34(H) 36(H)  Creatinine 0.44 - 1.00 mg/dL 1.11(H) 1.19(H) 1.42(H)  Sodium 135 - 145 mmol/L 143 144 143  Potassium 3.5 - 5.1 mmol/L 4.4 4.6 4.3  Chloride 98 - 111 mmol/L 116(H) 117(H) 115(H)  CO2 22 - 32 mmol/L 21(L) 19(L) 19(L)  Calcium 8.9 - 10.3 mg/dL 8.4(L) 8.4(L) 8.5(L)  Total Protein 6.5 - 8.1 g/dL 5.7(L) 5.7(L) 6.0(L)  Total Bilirubin 0.3 - 1.2 mg/dL 0.7 0.6 0.5  Alkaline Phos 38 - 126 U/L 51 53 52  AST 15 - 41 U/L 21 23 27   ALT 0 - 44 U/L 24 24 23     DIAGNOSTIC IMAGING:  I have independently reviewed the relevant imaging and discussed with the patient.  ASSESSMENT & PLAN: 1.  Normocytic anemia - Anemia felt to be secondary to CKD stage IIIb and relative iron deficiency - Patient had an EGD on 01/06/2015 due to hematemesis, which revealed severe erosive gastritis, moderate duodenitis, and multiple duodenal ulcers - She denies any recent hematemesis, bright red blood per rectum, or melena  - SPEP negative for M spike - She is currently receiving Aranesp 50 mcg every 12 weeks as needed for Hgb < 11.0.  (Aranesp has been hold for the last 5 visits, was last given on 04/19/2020) - Labs today (10/25/2021): Hgb 10.9/MCV 100.6, ferritin 162, iron saturation 26%.  Baseline creatinine 1.37/GFR 37 - Anemia likely secondary to CKD and relative iron deficiency.  Differential diagnosis also includes MDS, especially in light of mild macrocytosis - however, we will defer any additional work-up for the time being due to advanced age and mild nature of anemia. - PLAN: Patient's hemoglobin has been stable for the past year without need for ESA. - We will discontinue Aranesp protocol at this time. - No indication for IV iron at this time. - Labs only in 3 months - Repeat CBC/iron panel and RTC in 6 months   2.  Vitamin B12 deficiency: - Labs  today (09/06/2021) showed B12 level 602.  Methylmalonic acid is pending. - PLAN: Continue  daily B12 tablet    3.  Chronic kidney disease stage IIIb: - Creatinine has been stable at baseline - CMP today (09/06/2021): Creatinine 1.37/GFR 37, stable at baseline - PLAN: Repeat CMP at follow-up visit    4.  Vitamin D deficiency: - Vitamin D deficiency noted at last visit with labs on 03/22/2021 showing vitamin D low at 19.49 - Patient was instructed to start taking vitamin D 2000 units daily. - Vitamin D today (09/06/2021): Pending - PLAN: Vitamin D level is pending.  We will recheck at follow-up visit as well.     PLAN SUMMARY & DISPOSITION: Discontinue Aranesp protocol Labs only in 3 months (CBC, iron panel) Labs and RTC in 6 months (CBC, iron panel, B12, vitamin D, CMP)  All questions were answered. The patient knows to call the clinic with any problems, questions or concerns.  Medical decision making: Moderate  Time spent on visit: I spent 20 minutes counseling the patient face to face. The total time spent in the appointment was 30 minutes and more than 50% was on counseling.   Harriett Rush, PA-C  10/25/2021 2:35 PM

## 2021-10-25 ENCOUNTER — Inpatient Hospital Stay (HOSPITAL_COMMUNITY): Payer: Medicare Other | Attending: Hematology

## 2021-10-25 ENCOUNTER — Inpatient Hospital Stay (HOSPITAL_COMMUNITY): Payer: Medicare Other

## 2021-10-25 ENCOUNTER — Other Ambulatory Visit: Payer: Self-pay

## 2021-10-25 ENCOUNTER — Inpatient Hospital Stay (HOSPITAL_BASED_OUTPATIENT_CLINIC_OR_DEPARTMENT_OTHER): Payer: Medicare Other | Admitting: Physician Assistant

## 2021-10-25 VITALS — BP 139/75 | HR 102 | Temp 97.1°F | Resp 18

## 2021-10-25 DIAGNOSIS — E559 Vitamin D deficiency, unspecified: Secondary | ICD-10-CM | POA: Diagnosis not present

## 2021-10-25 DIAGNOSIS — D509 Iron deficiency anemia, unspecified: Secondary | ICD-10-CM | POA: Insufficient documentation

## 2021-10-25 DIAGNOSIS — D631 Anemia in chronic kidney disease: Secondary | ICD-10-CM

## 2021-10-25 DIAGNOSIS — I129 Hypertensive chronic kidney disease with stage 1 through stage 4 chronic kidney disease, or unspecified chronic kidney disease: Secondary | ICD-10-CM | POA: Diagnosis not present

## 2021-10-25 DIAGNOSIS — E538 Deficiency of other specified B group vitamins: Secondary | ICD-10-CM | POA: Diagnosis not present

## 2021-10-25 DIAGNOSIS — E1122 Type 2 diabetes mellitus with diabetic chronic kidney disease: Secondary | ICD-10-CM | POA: Insufficient documentation

## 2021-10-25 DIAGNOSIS — N1832 Chronic kidney disease, stage 3b: Secondary | ICD-10-CM

## 2021-10-25 LAB — VITAMIN D 25 HYDROXY (VIT D DEFICIENCY, FRACTURES): Vit D, 25-Hydroxy: 31.21 ng/mL (ref 30–100)

## 2021-10-25 LAB — COMPREHENSIVE METABOLIC PANEL
ALT: 9 U/L (ref 0–44)
AST: 14 U/L — ABNORMAL LOW (ref 15–41)
Albumin: 3.6 g/dL (ref 3.5–5.0)
Alkaline Phosphatase: 46 U/L (ref 38–126)
Anion gap: 8 (ref 5–15)
BUN: 34 mg/dL — ABNORMAL HIGH (ref 8–23)
CO2: 21 mmol/L — ABNORMAL LOW (ref 22–32)
Calcium: 8.8 mg/dL — ABNORMAL LOW (ref 8.9–10.3)
Chloride: 113 mmol/L — ABNORMAL HIGH (ref 98–111)
Creatinine, Ser: 1.37 mg/dL — ABNORMAL HIGH (ref 0.44–1.00)
GFR, Estimated: 37 mL/min — ABNORMAL LOW (ref >60)
Glucose, Bld: 120 mg/dL — ABNORMAL HIGH (ref 70–99)
Potassium: 3.9 mmol/L (ref 3.5–5.1)
Sodium: 142 mmol/L (ref 135–145)
Total Bilirubin: 0.5 mg/dL (ref 0.3–1.2)
Total Protein: 7.1 g/dL (ref 6.5–8.1)

## 2021-10-25 LAB — CBC WITH DIFFERENTIAL/PLATELET
Abs Immature Granulocytes: 0.01 10*3/uL (ref 0.00–0.07)
Basophils Absolute: 0 10*3/uL (ref 0.0–0.1)
Basophils Relative: 1 %
Eosinophils Absolute: 0.1 10*3/uL (ref 0.0–0.5)
Eosinophils Relative: 2 %
HCT: 33.3 % — ABNORMAL LOW (ref 36.0–46.0)
Hemoglobin: 10.9 g/dL — ABNORMAL LOW (ref 12.0–15.0)
Immature Granulocytes: 0 %
Lymphocytes Relative: 48 %
Lymphs Abs: 2.9 10*3/uL (ref 0.7–4.0)
MCH: 32.9 pg (ref 26.0–34.0)
MCHC: 32.7 g/dL (ref 30.0–36.0)
MCV: 100.6 fL — ABNORMAL HIGH (ref 80.0–100.0)
Monocytes Absolute: 0.4 10*3/uL (ref 0.1–1.0)
Monocytes Relative: 7 %
Neutro Abs: 2.5 10*3/uL (ref 1.7–7.7)
Neutrophils Relative %: 42 %
Platelets: 183 10*3/uL (ref 150–400)
RBC: 3.31 MIL/uL — ABNORMAL LOW (ref 3.87–5.11)
RDW: 13.3 % (ref 11.5–15.5)
WBC: 6 10*3/uL (ref 4.0–10.5)
nRBC: 0 % (ref 0.0–0.2)

## 2021-10-25 LAB — FOLATE: Folate: 16.5 ng/mL (ref >5.9)

## 2021-10-25 LAB — IRON AND TIBC
Iron: 66 ug/dL (ref 28–170)
Saturation Ratios: 26 % (ref 10.4–31.8)
TIBC: 251 ug/dL (ref 250–450)
UIBC: 185 ug/dL

## 2021-10-25 LAB — VITAMIN B12: Vitamin B-12: 602 pg/mL (ref 180–914)

## 2021-10-25 LAB — FERRITIN: Ferritin: 162 ng/mL (ref 11–307)

## 2021-10-25 NOTE — Progress Notes (Signed)
No injection needed today per Laura Abernethy, PA.

## 2021-10-25 NOTE — Patient Instructions (Signed)
[facility] Discharge Instructions  You were seen today by Olukemi Panchal PA-C for your anemia (low blood count).  Your blood counts today look great!  You do not need any more Aranesp shots for your blood right now, but we will continue to check your labs every 3 months.  We will schedule a follow up visit in 6 months.  LABS: Return in 12 weeks for repeat labs  OTHER TESTS: None  MEDICATIONS:  -- Continue previously prescribed home medications -- Continue Vitamin D -- Continue Vitamin B12  FOLLOW-UP APPOINTMENT: Office visit in 6 months   Thank you for choosing [facility] to provide your oncology and hematology care.  To afford each patient quality time with our provider, please arrive at least 15 minutes before your scheduled appointment time.   If you have a lab appointment with the Cancer Center please come in thru the Main Entrance and check in at the main information desk.  You need to re-schedule your appointment should you arrive 10 or more minutes late.  We strive to give you quality time with our providers, and arriving late affects you and other patients whose appointments are after yours.  Also, if you no show three or more times for appointments you may be dismissed from the clinic at the providers discretion.     Again, thank you for choosing [facility].  Our hope is that these requests will decrease the amount of time that you wait before being seen by our physicians.       _____________________________________________________________  Should you have questions after your visit to [facility], please contact our office at [phone] and follow the prompts.  Our office hours are 8:00 a.m. and 4:30 p.m. Monday - Friday.  Please note that voicemails left after 4:00 p.m. may not be returned until the following business day.  We are closed weekends and major holidays.   You do have access to a nurse 24-7, just call the main number to the clinic [phone] and do not press any options, hold on the line and a nurse will answer the phone.    For prescription refill requests, have your pharmacy contact our office and allow 72 hours.    Due to Covid, you will need to wear a mask upon entering the hospital. If you do not have a mask, a mask will be given to you at the Main Entrance upon arrival. For doctor visits, patients may have 1 support person age 18 or older with them. For treatment visits, patients can not have anyone with them due to social distancing guidelines and our immunocompromised population.   

## 2021-11-06 ENCOUNTER — Other Ambulatory Visit: Payer: Self-pay | Admitting: *Deleted

## 2021-11-06 NOTE — Patient Outreach (Signed)
[facility]) Care Management ?Geriatric Nurse Practitioner Note ? ? ?11/07/2021 ?Name:  Laura Davenport MRN:  [id number] DOB:  [birth year] ? ?Summary: ?Stable. Iron infusion not necessary this month. Had first COVID vaccination! ? ?Recommendations/Changes made from today's visit: ?Keep COVID vaccine schedule for injections. ?Continue providing safety and healthy nutrition. ? ?Subjective: ?Laura Davenport is an 88 y.o. year old female who is a primary patient of [name], MD. The care management team was consulted for assistance with care management and/or care coordination needs.   ? ?Geriatric Nurse Practitioner completed Telephone Visit today.  ? ?Objective: ? ?Medications Reviewed Today   ? ? Reviewed by Nilda Riggs., LPN (Licensed Practical Nurse) on 10/25/21 at 1324  Med List Status: <None>  ? ?Medication Order Taking? Sig Documenting Provider Last Dose Status Informant  ?acetaminophen (TYLENOL) 500 MG tablet [id number] Yes Take 500 mg by mouth every 6 (six) hours as needed. [provider] Taking Active Child  ?aspirin EC 81 MG EC tablet [id number] Yes Take 1 tablet (81 mg total) by mouth daily. Murlean Iba, MD Taking Active Child  ?diltiazem (CARDIZEM CD) 240 MG 24 hr capsule [id number] Yes Take 240 mg by mouth daily. [provider] Taking Active   ?donepezil (ARICEPT) 10 MG tablet [id number] Yes Take 10 mg by mouth at bedtime.  [provider] Taking Active Child  ?losartan (COZAAR) 100 MG tablet [id number] Yes Take 100 mg by mouth daily. Resume per Dr. Willey Blade. [provider] Taking Active Child  ?pantoprazole (PROTONIX) 40 MG tablet [id number] Yes TAKE 1 TABLET(40 MG) BY MOUTH DAILY BEFORE BREAKFAST [name], NP Taking Active Child  ?vitamin B-12 (CYANOCOBALAMIN) 250 MCG tablet [id number] Yes Take 250 mcg by mouth daily. [provider] Taking Active Child  ? ?  ?  ? ?  ? ? ? ?SDOH:  (Social Determinants of Health) assessments and  interventions performed:  ? ? ?Care Plan ? ?Review of patient past medical history, allergies, medications, health status, including review of consultants reports, laboratory and other test data, was performed as part of comprehensive evaluation for care management services.  ? ?Care Plan : RN Care Manager Plan of Care  ?Updates made by Deloria Lair, NP since 11/07/2021 12:00 AM  ?  ? ?Problem: Falls   ?Priority: High  ?Onset Date: 07/12/2021  ?  ? ?Long-Range Goal: Pt will not have any falls resulting in injury requiring admission over the next 3 months per daughter's report. (Dementia will be dropped from the long term goal and Weight maintenance will be in a new goal after today)   ?Start Date: 07/12/2021  ?Expected End Date: 03/01/2022  ?This Visit's Progress: On track  ?Priority: High  ?Note:   ? ?Update 11/06/21:  (Status: Goal on Track (progressing): YES.) Long Term Goal  ?Evaluation of current treatment plan related to Falls and patient's adherence to plan as established by provider ?      Daughter reports pt has not had any falls. Fall precautions are strictly followed. Praised for the families vigilance. ? ? ?Falls Interventions:  (Status:  Goal on track:  Yes.) Long Term Goal ?Assessed for falls since last encounter ?Assessed patients knowledge of fall risk prevention secondary to previously provided education ? ? ?Update 11/06/21:  (Status: Goal Met.) Long Term Goal  ?Evaluation of current treatment plan related to Dementia and patient's adherence to plan as established by provider ?      Daughter and other family members are  vigilant for pt safety. They are engaged with patient to stimulate her mind. They provide a pleasant and calm environment. ? ?Dementia:  (Status:  Goal on track:  Yes.)  Long Term Goal ?Evaluation of current treatment plan related to Dementia with other behavioral disturbances ?Emotional Support Provided to patient/caregiver, Advised to contact provider for new or worsening symptoms,  and to make a list of things others caregiver can delegate to others to reduce her care burden. ?Provide stimulating environment: caregiver promoting independence with eating and moving independently in her wheelchair and cooperating with PT to improve her strength and mobility. ? ?Weight maintenance New goal started for wt maintenance 11/06/21. ?        Still has not been able to weigh at home but has a scale and will see if pt can weigh with PT tomorrow. Eating very well. Daughter feels she is gaining wt. ? ?Current Barriers:  ?Cognitive Deficits ? ?RNCM Clinical Goal(s):  ?Patient will continue to work with RN Care Manager to address care management and care coordination needs related to  Dementia and safety and wt maintenance. ?work with PT to improve safety for transferring, ambulating and fall prevention. ?not experience hospital admission. Hospital Admissions in last 6 months = 1  through collaboration with RN Care manager, provider, and care team.  ? ?Interventions: ?Inter-disciplinary care team collaboration (see longitudinal plan of care) ?Evaluation of current treatment plan related to  self management and patient's adherence to plan as established by provider ? ?Weight Maintenance Interventions:  ?Daughter reports appetite steadily improving, she ate 100% of her breakfast today.  Praised for providing appropriate diet and encouragement. ?Scale still not available and pt refused to weigh at MD office yesterday. ? ?Falls Interventions: ?Provided written and verbal education re: potential causes of falls and Fall prevention strategies; ?Reviewed medications and discussed potential side effects of medications such as dizziness and frequent urination; ? ?Patient Goals/Self-Care Activities: ?       Family with assist pt to  avoid falls over the next 90 days per report by caregiver each month. ?Caregiver to assist with weekly weight and recording over the next 3 months. ?Weight to remain >115# or 5# less than  current home weight which is unknown until acquire scales over the next 3 months. ? ?Follow Up Plan:  Telephone follow up appointment with care management team member scheduled for:  1 week. ?The patient has been provided with contact information for the care management team and has been advised to call with any health related questions or concerns.  ?The patient will call primary care provider or NP as advised to ..  ? ?  ? ?Problem: Weight Management   ?Priority: Medium  ?Onset Date: 11/07/2021  ?  ? ?Long-Range Goal: Patient weight will be maintained > 125# over the next 3 months as reported by daughter. Current wt is 131.7.   ?Start Date: 11/07/2021  ?Expected End Date: 03/01/2022  ?Priority: Medium  ?Note:   ?Current Barriers:  ?Chronic Disease Management support and education needs related to Dementia and wt loss  ? ?RNCM Clinical Goal(s):  ?Patient will demonstrate ongoing health management independence as evidenced by weighing at home and appropriate oral nutritional intake        through collaboration with RN Care manager, provider, and care team.  ? ?Interventions: ?Inter-disciplinary care team collaboration (see longitudinal plan of care) ?Evaluation of current treatment plan related to  self management and patient's adherence to plan as established by provider ?  Have requested that daughter get a scale for in home weights. Most recent wt at MD office was 131.7. Have previously discussed preparing foods that pt has traditionally enjoyed. Offer small but frequent meals. Offer water frequently throughout the day. Offer protein drinks daily.  Encouraged protein snacks readily available at pt side during the day. Like peanut butter crackers, cheese, protein bars, nuts. ? ?Patient Goals/Self-Care Activities: ?Family will provide nutritious and healthy meals and snacks. Pt will be weighed once a week. ? ? ?  ?  ? ?Plan: Telephone follow up appointment with care management team member scheduled for:  3 months,  June. ?Daugher agrees to plan of care. ? ?Eulah Pont. , MSN, GNP-BC ?Gerontological Nurse Practitioner ?[facility] Care Management ?[phone] ? ?

## 2021-11-07 LAB — METHYLMALONIC ACID, SERUM: Methylmalonic Acid, Quantitative: 298 nmol/L (ref 0–378)

## 2021-12-25 DIAGNOSIS — I10 Essential (primary) hypertension: Secondary | ICD-10-CM | POA: Diagnosis not present

## 2021-12-25 DIAGNOSIS — N184 Chronic kidney disease, stage 4 (severe): Secondary | ICD-10-CM | POA: Diagnosis not present

## 2021-12-25 DIAGNOSIS — G309 Alzheimer's disease, unspecified: Secondary | ICD-10-CM | POA: Diagnosis not present

## 2022-01-03 DIAGNOSIS — E119 Type 2 diabetes mellitus without complications: Secondary | ICD-10-CM | POA: Diagnosis not present

## 2022-01-22 ENCOUNTER — Inpatient Hospital Stay (HOSPITAL_COMMUNITY): Payer: Medicare Other | Attending: Hematology

## 2022-01-22 DIAGNOSIS — N1832 Chronic kidney disease, stage 3b: Secondary | ICD-10-CM | POA: Insufficient documentation

## 2022-01-22 DIAGNOSIS — D631 Anemia in chronic kidney disease: Secondary | ICD-10-CM | POA: Diagnosis not present

## 2022-01-22 DIAGNOSIS — E538 Deficiency of other specified B group vitamins: Secondary | ICD-10-CM | POA: Diagnosis not present

## 2022-01-22 DIAGNOSIS — D509 Iron deficiency anemia, unspecified: Secondary | ICD-10-CM | POA: Diagnosis not present

## 2022-01-22 DIAGNOSIS — E559 Vitamin D deficiency, unspecified: Secondary | ICD-10-CM | POA: Insufficient documentation

## 2022-01-22 LAB — CBC WITH DIFFERENTIAL/PLATELET
Abs Immature Granulocytes: 0.01 10*3/uL (ref 0.00–0.07)
Basophils Absolute: 0 10*3/uL (ref 0.0–0.1)
Basophils Relative: 1 %
Eosinophils Absolute: 0.1 10*3/uL (ref 0.0–0.5)
Eosinophils Relative: 3 %
HCT: 36 % (ref 36.0–46.0)
Hemoglobin: 11.8 g/dL — ABNORMAL LOW (ref 12.0–15.0)
Immature Granulocytes: 0 %
Lymphocytes Relative: 36 %
Lymphs Abs: 2 10*3/uL (ref 0.7–4.0)
MCH: 32.1 pg (ref 26.0–34.0)
MCHC: 32.8 g/dL (ref 30.0–36.0)
MCV: 97.8 fL (ref 80.0–100.0)
Monocytes Absolute: 0.4 10*3/uL (ref 0.1–1.0)
Monocytes Relative: 7 %
Neutro Abs: 3 10*3/uL (ref 1.7–7.7)
Neutrophils Relative %: 53 %
Platelets: 187 10*3/uL (ref 150–400)
RBC: 3.68 MIL/uL — ABNORMAL LOW (ref 3.87–5.11)
RDW: 13.8 % (ref 11.5–15.5)
WBC: 5.6 10*3/uL (ref 4.0–10.5)
nRBC: 0 % (ref 0.0–0.2)

## 2022-01-22 LAB — IRON AND TIBC
Iron: 76 ug/dL (ref 28–170)
Saturation Ratios: 26 % (ref 10.4–31.8)
TIBC: 291 ug/dL (ref 250–450)
UIBC: 215 ug/dL

## 2022-01-22 LAB — FERRITIN: Ferritin: 149 ng/mL (ref 11–307)

## 2022-02-07 ENCOUNTER — Ambulatory Visit: Payer: Medicare Other | Admitting: *Deleted

## 2022-02-07 ENCOUNTER — Other Ambulatory Visit: Payer: Self-pay | Admitting: *Deleted

## 2022-02-07 NOTE — Patient Outreach (Signed)
Andjela Wickes C. Teigen Bellin, MSN, GNP-BC ?Gerontological Nurse Practitioner ?[facility] Care Management ?[phone] ? ?

## 2022-02-08 NOTE — Patient Outreach (Signed)
[facility]) Care Management Geriatric Nurse Practitioner Note   02/08/2022 Name:  Laura Davenport MRN:  [id number] DOB:  06-07-1931  Summary: General health improving!  Recommendations/Changes made from today's visit: Continue excellent family home care!  Subjective: Laura Davenport is an 88 y.o. year old female who is a primary patient of Laura Noble, MD. The care management team was consulted for assistance with care management and/or care coordination needs.    Geriatric Nurse Practitioner completed Telephone Visit today.   Medications Reviewed Today     Reviewed by Deloria Lair, NP (Nurse Practitioner) on 02/07/22 at [place] List Status: <None>   Medication Order Taking? Sig Documenting Provider Last Dose Status Informant  acetaminophen (TYLENOL) 500 MG tablet [id number] No Take 500 mg by mouth every 6 (six) hours as needed. [provider] Taking Active Child  aspirin EC 81 MG EC tablet [id number] No Take 1 tablet (81 mg total) by mouth daily. Murlean Iba, MD Taking Active Child  diltiazem (CARDIZEM CD) 240 MG 24 hr capsule [id number] No Take 240 mg by mouth daily. [provider] Taking Active   donepezil (ARICEPT) 10 MG tablet [id number] No Take 10 mg by mouth at bedtime.  [provider] Taking Active Child  losartan (COZAAR) 100 MG tablet [id number] No Take 100 mg by mouth daily. Resume per Dr. Willey Blade. [provider] Taking Active Child  pantoprazole (PROTONIX) 40 MG tablet [id number] No TAKE 1 TABLET(40 MG) BY MOUTH DAILY BEFORE BREAKFAST [name], NP Taking Active Child  vitamin B-12 (CYANOCOBALAMIN) 250 MCG tablet [id number] No Take 250 mcg by mouth daily. [provider] Taking Active Child             SDOH:  (Social Determinants of Health) assessments and interventions performed:    Care Plan  Review of patient past medical history, allergies, medications, health status, including review of  consultants reports, laboratory and other test data, was performed as part of comprehensive evaluation for care management services.   Care Plan : RN Care Manager Plan of Care  Updates made by Deloria Lair, NP since 02/08/2022 12:00 AM     Problem: Falls   Priority: High  Onset Date: 07/12/2021     Long-Range Goal: Pt will not have any falls resulting in injury requiring admission over the next 3 months per daughter's report. (Dementia will be dropped from the long term goal and Weight maintenance will be in a new goal after today)   Start Date: 07/12/2021  Expected End Date: 03/01/2022  This Visit's Progress: On track  Recent Progress: On track  Priority: High  Note:   Update 02/07/22 (Status: Goal Met.) Long Term Goal  Evaluation of current treatment plan related to FALLS and patient's adherence to plan as established by provider Daughter, Laura Davenport, happily reports her mother has not had any falls. She is well supervised. She uses her walker consistently.  Update 11/06/21:  (Status: Goal on Track (progressing): YES.) Long Term Goal  Evaluation of current treatment plan related to Falls and patient's adherence to plan as established by provider       Daughter reports pt has not had any falls. Fall precautions are strictly followed. Praised for the families vigilance.   Falls Interventions:  (Status:  Goal on track:  Yes.) Long Term Goal Assessed for falls since last encounter Assessed patients knowledge of fall risk prevention secondary to previously provided education   Update 11/06/21:  (Status: Goal Met.) Long Term  Goal  Evaluation of current treatment plan related to Dementia and patient's adherence to plan as established by provider       Daughter and other family members are vigilant for pt safety. They are engaged with patient to stimulate her mind. They provide a pleasant and calm environment.  Dementia:  (Status:  Goal on track:  Yes.)  Long Term Goal Evaluation of current  treatment plan related to Dementia with other behavioral disturbances Emotional Support Provided to patient/caregiver, Advised to contact provider for new or worsening symptoms, and to make a list of things others caregiver can delegate to others to reduce her care burden. Provide stimulating environment: caregiver promoting independence with eating and moving independently in her wheelchair and cooperating with PT to improve her strength and mobility.  Weight maintenance New goal started for wt maintenance 11/06/21.         Still has not been able to weigh at home but has a scale and will see if pt can weigh with PT tomorrow. Eating very well. Daughter feels she is gaining wt.  Current Barriers:  Cognitive Deficits  RNCM Clinical Goal(s):  Patient will continue to work with RN Care Manager to address care management and care coordination needs related to  Dementia and safety and wt maintenance. work with PT to improve safety for transferring, ambulating and fall prevention. not experience hospital admission. Hospital Admissions in last 6 months = 1  through collaboration with RN Care manager, provider, and care team.   Interventions: Inter-disciplinary care team collaboration (see longitudinal plan of care) Evaluation of current treatment plan related to  self management and patient's adherence to plan as established by provider  Weight Maintenance Interventions:  Daughter reports appetite steadily improving, she ate 100% of her breakfast today.  Praised for providing appropriate diet and encouragement. Scale still not available and pt refused to weigh at MD office yesterday.  Falls Interventions: Provided written and verbal education re: potential causes of falls and Fall prevention strategies; Reviewed medications and discussed potential side effects of medications such as dizziness and frequent urination;  Patient Goals/Self-Care Activities:        Family with assist pt to  avoid falls  over the next 90 days per report by caregiver each month. Caregiver to assist with weekly weight and recording over the next 3 months. Weight to remain >115# or 5# less than current home weight which is unknown until acquire scales over the next 3 months.  Follow Up Plan:  Telephone follow up appointment with care management team member scheduled for:  1 week. The patient has been provided with contact information for the care management team and has been advised to call with any health related questions or concerns.  The patient will call primary care provider or NP as advised to .Marland Kitchen      Problem: Weight Management   Priority: Medium  Onset Date: 11/07/2021     Long-Range Goal: Patient weight will be maintained > 125# over the next 3 months as reported by daughter. Current wt is 131.7.   Start Date: 11/07/2021  Expected End Date: 03/01/2022  This Visit's Progress: On track  Priority: Medium  Note:    Update 02/07/22:  (Status: Goal Met.) Long Term Goal  Evaluation of current treatment plan related to GAINING WT and patient's adherence to plan as established by provider Daughter reports pt is eating with enthusiasm now and has gained 6# since we talked in March!!!! Encouraged continued attempts to provider a nutritional  intake and adequate fluids.  Current Barriers:  Chronic Disease Management support and education needs related to Dementia and wt loss   RNCM Clinical Goal(s):  Patient will demonstrate ongoing health management independence as evidenced by weighing at home and appropriate oral nutritional intake        through collaboration with RN Care manager, provider, and care team.   Interventions: Inter-disciplinary care team collaboration (see longitudinal plan of care) Evaluation of current treatment plan related to  self management and patient's adherence to plan as established by provider       Have requested that daughter get a scale for in home weights. Most recent wt at MD  office was 131.7. Have previously discussed preparing foods that pt has traditionally enjoyed. Offer small but frequent meals. Offer water frequently throughout the day. Offer protein drinks daily.  Encouraged protein snacks readily available at pt side during the day. Like peanut butter crackers, cheese, protein bars, nuts.  Patient Goals/Self-Care Activities: Family will provide nutritious and healthy meals and snacks. Pt will be weighed once a week.        Plan: No further follow up required: [place].  Eulah Pont. Myrtie Neither, MSN, [facility] Gerontological Nurse Practitioner [facility] Care Management [phone]

## 2022-03-26 DIAGNOSIS — I10 Essential (primary) hypertension: Secondary | ICD-10-CM | POA: Diagnosis not present

## 2022-03-26 DIAGNOSIS — G309 Alzheimer's disease, unspecified: Secondary | ICD-10-CM | POA: Diagnosis not present

## 2022-03-27 ENCOUNTER — Other Ambulatory Visit: Payer: Self-pay | Admitting: Nurse Practitioner

## 2022-03-27 DIAGNOSIS — K219 Gastro-esophageal reflux disease without esophagitis: Secondary | ICD-10-CM

## 2022-04-04 DIAGNOSIS — Z7409 Other reduced mobility: Secondary | ICD-10-CM | POA: Diagnosis not present

## 2022-04-04 DIAGNOSIS — S82002A Unspecified fracture of left patella, initial encounter for closed fracture: Secondary | ICD-10-CM | POA: Diagnosis not present

## 2022-04-04 DIAGNOSIS — M24562 Contracture, left knee: Secondary | ICD-10-CM | POA: Diagnosis not present

## 2022-04-04 DIAGNOSIS — S42301A Unspecified fracture of shaft of humerus, right arm, initial encounter for closed fracture: Secondary | ICD-10-CM | POA: Diagnosis not present

## 2022-04-04 DIAGNOSIS — G309 Alzheimer's disease, unspecified: Secondary | ICD-10-CM | POA: Diagnosis not present

## 2022-04-05 DIAGNOSIS — E119 Type 2 diabetes mellitus without complications: Secondary | ICD-10-CM | POA: Diagnosis not present

## 2022-04-23 NOTE — Progress Notes (Deleted)
NO SHOW

## 2022-04-24 ENCOUNTER — Other Ambulatory Visit: Payer: Self-pay | Admitting: *Deleted

## 2022-04-24 ENCOUNTER — Encounter: Payer: Self-pay | Admitting: *Deleted

## 2022-04-24 ENCOUNTER — Inpatient Hospital Stay: Payer: Medicare Other | Attending: Physician Assistant | Admitting: Physician Assistant

## 2022-04-24 ENCOUNTER — Inpatient Hospital Stay: Payer: Medicare Other

## 2022-04-24 DIAGNOSIS — I129 Hypertensive chronic kidney disease with stage 1 through stage 4 chronic kidney disease, or unspecified chronic kidney disease: Secondary | ICD-10-CM | POA: Insufficient documentation

## 2022-04-24 DIAGNOSIS — N1832 Chronic kidney disease, stage 3b: Secondary | ICD-10-CM | POA: Insufficient documentation

## 2022-04-24 DIAGNOSIS — E538 Deficiency of other specified B group vitamins: Secondary | ICD-10-CM | POA: Insufficient documentation

## 2022-04-24 DIAGNOSIS — D631 Anemia in chronic kidney disease: Secondary | ICD-10-CM | POA: Insufficient documentation

## 2022-04-24 DIAGNOSIS — D509 Iron deficiency anemia, unspecified: Secondary | ICD-10-CM | POA: Insufficient documentation

## 2022-04-24 DIAGNOSIS — E559 Vitamin D deficiency, unspecified: Secondary | ICD-10-CM | POA: Insufficient documentation

## 2022-04-24 DIAGNOSIS — E1122 Type 2 diabetes mellitus with diabetic chronic kidney disease: Secondary | ICD-10-CM | POA: Insufficient documentation

## 2022-04-24 NOTE — Patient Outreach (Signed)
  Care Coordination   Initial Visit Note   04/24/2022 Name: Laura Davenport MRN: [id number] DOB: November 17, 1930  Laura Davenport is a 88 y.o. year old female who sees [name], MD for primary care. I spoke with  Laura Davenport by phone today  What matters to the patients health and wellness today?  Getting Covid Booster and maintaining health.  Daughter monitors blood pressure and heart rate twice a week, both stable.     Goals Addressed             This Visit's Progress    COMPLETED: Care Coordination Activites - No follow up needed       Care Coordination Interventions: Evaluation of current treatment plan related to hypertension self management and patient's adherence to plan as established by provider Reviewed medications with patient and discussed importance of compliance Reviewed scheduled/upcoming provider appointments including:  Assessed social determinant of health barriers         SDOH assessments and interventions completed:  Yes  SDOH Interventions Today    Flowsheet Row Most Recent Value  SDOH Interventions   Food Insecurity Interventions Intervention Not Indicated  Financial Strain Interventions Intervention Not Indicated  Housing Interventions Intervention Not Indicated  Transportation Interventions Intervention Not Indicated        Care Coordination Interventions Activated:  Yes  Care Coordination Interventions:  Yes, provided   Follow up plan: No further intervention required.   Encounter Outcome:  Pt. Visit Completed   Laura David, RN, MSN, [facility] Care Coordinator [phone]

## 2022-04-24 NOTE — Patient Instructions (Signed)
Visit Information  Thank you for taking time to visit with me today. Please don't hesitate to contact me if I can be of assistance to you before our next scheduled telephone appointment.  The patient verbalized understanding of instructions, educational materials, and care plan provided today and agreed to receive a mailed copy of patient instructions, educational materials, and care plan.   The patient has been provided with contact information for the care management team and has been advised to call with any health related questions or concerns.   Persais Ethridge, RN, MSN, CCM Care Coordinator [phone]  

## 2022-04-28 NOTE — Progress Notes (Unsigned)
[name], [place] [address]   CLINIC:  Medical Oncology/Hematology  PCP:  [name], MD [address] [place] [address] [phone]   REASON FOR VISIT:  Follow-up for anemia of CKD and relative iron deficiency  PRIOR THERAPY: Aranesp, as needed IV iron  CURRENT THERAPY: Observation  INTERVAL HISTORY:  Laura Davenport 88 y.o. female returns for routine follow-up of her normocytic anemia.  She was last seen by Tarri Abernethy PA-C on 10/25/2021.  She is accompanied today by her daughter, Allyne.  At today's visit, she reports feeling fairly well.  She denies any recent hospitalizations or changes in her baseline health status.  She denies any current signs or symptoms of bleeding such as epistaxis, melena, or bright red blood per rectum.   She does have some chronic fatigue, which is at baseline.  She denies chest pain, dyspnea, syncope, or palpitations. No fever, chills, night sweats, unintentional weight loss.  She has chronically low energy and  100% appetite. She endorses that she is maintaining a stable weight.   REVIEW OF SYSTEMS:    Review of Systems  Constitutional:  Positive for fatigue (at baseline, chronic). Negative for appetite change, chills, diaphoresis, fever and unexpected weight change.  HENT:   Positive for trouble swallowing. Negative for lump/mass and nosebleeds.   Eyes:  Negative for eye problems.  Respiratory:  Negative for cough, hemoptysis and shortness of breath.   Cardiovascular:  Negative for chest pain, leg swelling and palpitations.  Gastrointestinal:  Negative for abdominal pain, blood in stool, constipation, diarrhea, nausea and vomiting.  Genitourinary:  Negative for hematuria.   Musculoskeletal:  Positive for arthralgias.  Skin: Negative.   Neurological:  Negative for dizziness, headaches and light-headedness.  Hematological:  Does not bruise/bleed easily.      PAST MEDICAL/SURGICAL  HISTORY:  Past Medical History:  Diagnosis Date   Anemia in chronic kidney disease    B12 deficiency anemia    Chronic renal disease, stage 3, moderately decreased glomerular filtration rate (GFR) between 30-59 mL/min/1.73 square meter (HCC)    Dementia (HCC)    Depression    Essential hypertension    GI bleed    Severe gastritis and duodenal ulcers by EGD May 2016    Hyperlipidemia    PSVT (paroxysmal supraventricular tachycardia) ([place] [facility])    Septic arthritis ([place])    MRSA, left knee    Type 2 diabetes mellitus ([place])    Past Surgical History:  Procedure Laterality Date   CHOLECYSTECTOMY N/A 11/04/2012   Procedure: LAPAROSCOPIC CHOLECYSTECTOMY;  Surgeon: Jamesetta So, MD;  Location: [facility];  Service: General;  Laterality: N/A;   ESOPHAGOGASTRODUODENOSCOPY N/A 01/06/2015   SLF: mild esophagitis   I & D EXTREMITY Left 12/24/2014   Procedure: IRRIGATION AND DEBRIDEMENT EXTREMITY AND ARTHROSCOPY KNEE;  Surgeon: Renette Butters, MD;  Location: [facility];  Service: Orthopedics;  Laterality: Left;   KNEE SURGERY     SAVORY DILATION  01/06/2015   Procedure: SAVORY DILATION;  Surgeon: Danie Binder, MD;  [facility];  Service: Endoscopy;;     SOCIAL HISTORY:  Social History   Socioeconomic History   Marital status: Widowed    Spouse name: Not on file   Number of children: 9   Years of education: Not on file   Highest education level: Not on file  Occupational History   Occupation: retired; Education officer, museum  Tobacco Use   Smoking status: Never   Smokeless  tobacco: Never   Tobacco comments:    Never smoked  Substance and Sexual Activity   Alcohol use: No    Alcohol/week: 0.0 standard drinks of alcohol   Drug use: No   Sexual activity: Not Currently  Other Topics Concern   Not on file  Social History Narrative   Daughter Laura Davenport lives in her residence. Daughter Laura Davenport is primary caregiver and decision maker. Laura Davenport lives in same neighborhood. There are 9 total children and  grandchildren and they help out in any way possible.   Social Determinants of Health   Financial Resource Strain: Low Risk  (04/24/2022)   Overall Financial Resource Strain (CARDIA)    Difficulty of Paying Living Expenses: Not hard at all  Food Insecurity: No Food Insecurity (04/24/2022)   Hunger Vital Sign    Worried About Running Out of Food in the Last Year: Never true    Ran Out of Food in the Last Year: Never true  Transportation Needs: No Transportation Needs (04/24/2022)   PRAPARE - Hydrologist (Medical): No    Lack of Transportation (Non-Medical): No  Physical Activity: Inactive (07/11/2021)   Exercise Vital Sign    Days of Exercise per Week: 0 days    Minutes of Exercise per Session: 0 min  Stress: No Stress Concern Present (07/11/2021)   [place]    Feeling of Stress : Not at all  Social Connections: Socially Isolated (07/11/2021)   Social Connection and Isolation Panel [NHANES]    Frequency of Communication with Friends and Family: More than three times a week    Frequency of Social Gatherings with Friends and Family: More than three times a week    Attends Religious Services: Never    Marine scientist or Organizations: No    Attends Archivist Meetings: Never    Marital Status: Widowed  Intimate Partner Violence: Not At Risk (07/11/2021)   Humiliation, Afraid, Rape, and Kick questionnaire    Fear of Current or Ex-Partner: No    Emotionally Abused: No    Physically Abused: No    Sexually Abused: No    FAMILY HISTORY:  Family History  Family history unknown: Yes    CURRENT MEDICATIONS:  Outpatient Encounter Medications as of 04/29/2022  Medication Sig   acetaminophen (TYLENOL) 500 MG tablet Take 500 mg by mouth every 6 (six) hours as needed.   aspirin EC 81 MG EC tablet Take 1 tablet (81 mg total) by mouth daily.   diltiazem (CARDIZEM CD) 240 MG 24 hr  capsule Take 240 mg by mouth daily.   donepezil (ARICEPT) 10 MG tablet Take 10 mg by mouth at bedtime.    losartan (COZAAR) 100 MG tablet Take 100 mg by mouth daily. Resume per Dr. Willey Blade.   pantoprazole (PROTONIX) 40 MG tablet TAKE 1 TABLET(40 MG) BY MOUTH DAILY BEFORE BREAKFAST   vitamin B-12 (CYANOCOBALAMIN) 250 MCG tablet Take 250 mcg by mouth daily.   No facility-administered encounter medications on file as of 04/29/2022.    ALLERGIES:  Allergies  Allergen Reactions   Oxycodone Hives     PHYSICAL EXAM:    ECOG PERFORMANCE STATUS: 2 - Symptomatic, <50% confined to bed  There were no vitals filed for this visit. There were no vitals filed for this visit. Physical Exam Constitutional:      Appearance: Normal appearance.     Comments: Presents in wheelchair  HENT:  Head: Normocephalic and atraumatic.     Mouth/Throat:     Mouth: Mucous membranes are moist.  Eyes:     Extraocular Movements: Extraocular movements intact.     Pupils: Pupils are equal, round, and reactive to light.  Cardiovascular:     Rate and Rhythm: Normal rate and regular rhythm.     Pulses: Normal pulses.     Heart sounds: Normal heart sounds.  Pulmonary:     Effort: Pulmonary effort is normal.     Breath sounds: Normal breath sounds.  Abdominal:     General: Bowel sounds are normal.     Palpations: Abdomen is soft.     Tenderness: There is no abdominal tenderness.  Musculoskeletal:        General: No swelling.     Right lower leg: No edema.     Left lower leg: No edema.  Lymphadenopathy:     Cervical: No cervical adenopathy.  Skin:    General: Skin is warm and dry.  Neurological:     General: No focal deficit present.     Mental Status: She is alert. Mental status is at baseline.  Psychiatric:        Mood and Affect: Mood normal.        Behavior: Behavior normal.      LABORATORY DATA:  I have reviewed the labs as listed.  CBC    Component Value Date/Time   WBC 5.6 01/22/2022 0836    RBC 3.68 (L) 01/22/2022 0836   HGB 11.8 (L) 01/22/2022 0836   HCT 36.0 01/22/2022 0836   PLT 187 01/22/2022 0836   MCV 97.8 01/22/2022 0836   MCH 32.1 01/22/2022 0836   MCHC 32.8 01/22/2022 0836   RDW 13.8 01/22/2022 0836   LYMPHSABS 2.0 01/22/2022 0836   MONOABS 0.4 01/22/2022 0836   EOSABS 0.1 01/22/2022 0836   BASOSABS 0.0 01/22/2022 0836      Latest Ref Rng & Units 10/25/2021   11:57 AM 07/06/2021    5:26 AM 07/05/2021    5:56 AM  CMP  Glucose 70 - 99 mg/dL 120  171  198   BUN 8 - 23 mg/dL 34  27  34   Creatinine 0.44 - 1.00 mg/dL 1.37  1.11  1.19   Sodium 135 - 145 mmol/L 142  143  144   Potassium 3.5 - 5.1 mmol/L 3.9  4.4  4.6   Chloride 98 - 111 mmol/L 113  116  117   CO2 22 - 32 mmol/L 21  21  19    Calcium 8.9 - 10.3 mg/dL 8.8  8.4  8.4   Total Protein 6.5 - 8.1 g/dL 7.1  5.7  5.7   Total Bilirubin 0.3 - 1.2 mg/dL 0.5  0.7  0.6   Alkaline Phos 38 - 126 U/L 46  51  53   AST 15 - 41 U/L 14  21  23    ALT 0 - 44 U/L 9  24  24      DIAGNOSTIC IMAGING:  I have independently reviewed the relevant imaging and discussed with the patient.   ASSESSMENT & PLAN: 1.  Normocytic anemia - Anemia felt to be secondary to CKD stage IIIb and relative iron deficiency - Patient had an EGD on 01/06/2015 due to hematemesis, which revealed severe erosive gastritis, moderate duodenitis, and multiple duodenal ulcers - She denies any recent hematemesis, bright red blood per rectum, or melena    - SPEP negative for M spike - She pretty  easily received Aranesp 50 mcg every 12 weeks as needed for Hgb < 11.0.  (Aranesp last given on 04/19/2020) - Labs today (04/29/2022): Hgb 10.8/MCV 97.7, ferritin 175, iron saturation 21% - Anemia likely secondary to CKD and relative iron deficiency.  Differential diagnosis also includes MDS, especially in light of mild macrocytosis - however, we will defer any additional work-up for the time being due to advanced age and mild nature of anemia.   - PLAN: Patient's  hemoglobin has been stable for the past year without need for ESA. - No indication to restart Aranesp protocol at this time.   - No indication for IV iron at this time.   - Labs only in 3 months   - Repeat CBC/iron panel and RTC in 6 months     2.  Vitamin B12 deficiency: - Labs today (04/29/2022) showed B12 level normal at 679.  Methylmalonic acid is pending. - PLAN: Continue daily B12 tablet.  Recheck B12/MMA in 1 year.   3.  Chronic kidney disease stage IIIb: - Creatinine has been stable at baseline - CMP today (04/29/2022): Creatinine 1.33/GFR 38, stable at baseline - PLAN: Repeat CMP at follow-up visit    4.  Vitamin D deficiency: - Vitamin D deficiency noted on 03/22/2021, with vitamin D low at 19.49 - Patient was instructed to start taking vitamin D 2000 units daily. - Vitamin D today (04/29/2022): Pending   - PLAN: Vitamin D level is pending.  We will recheck at follow-up visit in 1 year.   PLAN SUMMARY & DISPOSITION:   Labs only in 3 months (CBC, iron panel) Labs and RTC in 6 months (CBC, iron panel, CMP)   All questions were answered. The patient knows to call the clinic with any problems, questions or concerns.  Medical decision making: Moderate   Time spent on visit: I spent 20 minutes counseling the patient face to face. The total time spent in the appointment was 30 minutes and more than 50% was on counseling.   Harriett Rush, PA-C  04/29/22 6:33 PM

## 2022-04-29 ENCOUNTER — Inpatient Hospital Stay (HOSPITAL_BASED_OUTPATIENT_CLINIC_OR_DEPARTMENT_OTHER): Payer: Medicare Other | Admitting: Physician Assistant

## 2022-04-29 ENCOUNTER — Encounter: Payer: Self-pay | Admitting: Physician Assistant

## 2022-04-29 ENCOUNTER — Inpatient Hospital Stay: Payer: Medicare Other

## 2022-04-29 VITALS — BP 135/56 | HR 90 | Temp 97.0°F | Resp 18 | Ht 62.5 in

## 2022-04-29 DIAGNOSIS — E538 Deficiency of other specified B group vitamins: Secondary | ICD-10-CM | POA: Diagnosis not present

## 2022-04-29 DIAGNOSIS — E559 Vitamin D deficiency, unspecified: Secondary | ICD-10-CM

## 2022-04-29 DIAGNOSIS — D509 Iron deficiency anemia, unspecified: Secondary | ICD-10-CM | POA: Diagnosis not present

## 2022-04-29 DIAGNOSIS — N1832 Chronic kidney disease, stage 3b: Secondary | ICD-10-CM

## 2022-04-29 DIAGNOSIS — D631 Anemia in chronic kidney disease: Secondary | ICD-10-CM

## 2022-04-29 DIAGNOSIS — E1122 Type 2 diabetes mellitus with diabetic chronic kidney disease: Secondary | ICD-10-CM | POA: Diagnosis not present

## 2022-04-29 DIAGNOSIS — I129 Hypertensive chronic kidney disease with stage 1 through stage 4 chronic kidney disease, or unspecified chronic kidney disease: Secondary | ICD-10-CM | POA: Diagnosis not present

## 2022-04-29 LAB — CBC WITH DIFFERENTIAL/PLATELET
Abs Immature Granulocytes: 0.01 10*3/uL (ref 0.00–0.07)
Basophils Absolute: 0 10*3/uL (ref 0.0–0.1)
Basophils Relative: 1 %
Eosinophils Absolute: 0.1 10*3/uL (ref 0.0–0.5)
Eosinophils Relative: 1 %
HCT: 33.8 % — ABNORMAL LOW (ref 36.0–46.0)
Hemoglobin: 10.8 g/dL — ABNORMAL LOW (ref 12.0–15.0)
Immature Granulocytes: 0 %
Lymphocytes Relative: 47 %
Lymphs Abs: 3.1 10*3/uL (ref 0.7–4.0)
MCH: 31.2 pg (ref 26.0–34.0)
MCHC: 32 g/dL (ref 30.0–36.0)
MCV: 97.7 fL (ref 80.0–100.0)
Monocytes Absolute: 0.6 10*3/uL (ref 0.1–1.0)
Monocytes Relative: 8 %
Neutro Abs: 2.9 10*3/uL (ref 1.7–7.7)
Neutrophils Relative %: 43 %
Platelets: 202 10*3/uL (ref 150–400)
RBC: 3.46 MIL/uL — ABNORMAL LOW (ref 3.87–5.11)
RDW: 13.9 % (ref 11.5–15.5)
WBC: 6.7 10*3/uL (ref 4.0–10.5)
nRBC: 0 % (ref 0.0–0.2)

## 2022-04-29 LAB — COMPREHENSIVE METABOLIC PANEL
ALT: 9 U/L (ref 0–44)
AST: 14 U/L — ABNORMAL LOW (ref 15–41)
Albumin: 3.6 g/dL (ref 3.5–5.0)
Alkaline Phosphatase: 65 U/L (ref 38–126)
Anion gap: 8 (ref 5–15)
BUN: 28 mg/dL — ABNORMAL HIGH (ref 8–23)
CO2: 24 mmol/L (ref 22–32)
Calcium: 8.9 mg/dL (ref 8.9–10.3)
Chloride: 111 mmol/L (ref 98–111)
Creatinine, Ser: 1.33 mg/dL — ABNORMAL HIGH (ref 0.44–1.00)
GFR, Estimated: 38 mL/min — ABNORMAL LOW (ref >60)
Glucose, Bld: 101 mg/dL — ABNORMAL HIGH (ref 70–99)
Potassium: 3.8 mmol/L (ref 3.5–5.1)
Sodium: 143 mmol/L (ref 135–145)
Total Bilirubin: 0.8 mg/dL (ref 0.3–1.2)
Total Protein: 7.4 g/dL (ref 6.5–8.1)

## 2022-04-29 LAB — IRON AND TIBC
Iron: 53 ug/dL (ref 28–170)
Saturation Ratios: 21 % (ref 10.4–31.8)
TIBC: 252 ug/dL (ref 250–450)
UIBC: 199 ug/dL

## 2022-04-29 LAB — VITAMIN D 25 HYDROXY (VIT D DEFICIENCY, FRACTURES): Vit D, 25-Hydroxy: 31.78 ng/mL (ref 30–100)

## 2022-04-29 LAB — VITAMIN B12: Vitamin B-12: 679 pg/mL (ref 180–914)

## 2022-04-29 LAB — FERRITIN: Ferritin: 175 ng/mL (ref 11–307)

## 2022-04-29 NOTE — Patient Instructions (Signed)
[facility] Discharge Instructions  You were seen today by Shemika Robbs PA-C for your anemia (low blood count).  Your blood counts today look great!  You do not need any more Aranesp shots for your blood right now, but we will continue to check your labs every 3 months.  We will schedule a follow up visit in 6 months.  LABS: Return in 12 weeks for repeat labs  OTHER TESTS: None  MEDICATIONS:  -- Continue previously prescribed home medications -- Continue Vitamin D -- Continue Vitamin B12  FOLLOW-UP APPOINTMENT: Office visit in 6 months   Thank you for choosing [facility] at [facility] to provide your oncology and hematology care.  To afford each patient quality time with our provider, please arrive at least 15 minutes before your scheduled appointment time.   If you have a lab appointment with the Cancer Center please come in thru the Main Entrance and check in at the main information desk.  You need to re-schedule your appointment should you arrive 10 or more minutes late.  We strive to give you quality time with our providers, and arriving late affects you and other patients whose appointments are after yours.  Also, if you no show three or more times for appointments you may be dismissed from the clinic at the providers discretion.     Again, thank you for choosing [facility].  Our hope is that these requests will decrease the amount of time that you wait before being seen by our physicians.       _____________________________________________________________  Should you have questions after your visit to [facility], please contact our office at [phone] and follow the prompts.  Our office hours are 8:00 a.m. and 4:30 p.m. Monday - Friday.  Please note that voicemails left after 4:00 p.m. may not be returned until the following business day.  We are closed weekends and major holidays.   You do have access to a nurse 24-7, just call the main number to the clinic [phone] and do not press any options, hold on the line and a nurse will answer the phone.    For prescription refill requests, have your pharmacy contact our office and allow 72 hours.    Due to Covid, you will need to wear a mask upon entering the hospital. If you do not have a mask, a mask will be given to you at the Main Entrance upon arrival. For doctor visits, patients may have 1 support person age 18 or older with them. For treatment visits, patients can not have anyone with them due to social distancing guidelines and our immunocompromised population.   

## 2022-05-01 LAB — METHYLMALONIC ACID, SERUM: Methylmalonic Acid, Quantitative: 306 nmol/L (ref 0–378)

## 2022-05-05 DIAGNOSIS — Z7409 Other reduced mobility: Secondary | ICD-10-CM | POA: Diagnosis not present

## 2022-05-05 DIAGNOSIS — S82002A Unspecified fracture of left patella, initial encounter for closed fracture: Secondary | ICD-10-CM | POA: Diagnosis not present

## 2022-05-05 DIAGNOSIS — G309 Alzheimer's disease, unspecified: Secondary | ICD-10-CM | POA: Diagnosis not present

## 2022-05-05 DIAGNOSIS — M24562 Contracture, left knee: Secondary | ICD-10-CM | POA: Diagnosis not present

## 2022-05-05 DIAGNOSIS — S42301A Unspecified fracture of shaft of humerus, right arm, initial encounter for closed fracture: Secondary | ICD-10-CM | POA: Diagnosis not present

## 2022-06-04 DIAGNOSIS — Z7409 Other reduced mobility: Secondary | ICD-10-CM | POA: Diagnosis not present

## 2022-06-04 DIAGNOSIS — M24562 Contracture, left knee: Secondary | ICD-10-CM | POA: Diagnosis not present

## 2022-06-04 DIAGNOSIS — G309 Alzheimer's disease, unspecified: Secondary | ICD-10-CM | POA: Diagnosis not present

## 2022-06-04 DIAGNOSIS — S82002A Unspecified fracture of left patella, initial encounter for closed fracture: Secondary | ICD-10-CM | POA: Diagnosis not present

## 2022-06-04 DIAGNOSIS — S42301A Unspecified fracture of shaft of humerus, right arm, initial encounter for closed fracture: Secondary | ICD-10-CM | POA: Diagnosis not present

## 2022-07-05 DIAGNOSIS — M24562 Contracture, left knee: Secondary | ICD-10-CM | POA: Diagnosis not present

## 2022-07-05 DIAGNOSIS — G309 Alzheimer's disease, unspecified: Secondary | ICD-10-CM | POA: Diagnosis not present

## 2022-07-05 DIAGNOSIS — S42301A Unspecified fracture of shaft of humerus, right arm, initial encounter for closed fracture: Secondary | ICD-10-CM | POA: Diagnosis not present

## 2022-07-05 DIAGNOSIS — Z7409 Other reduced mobility: Secondary | ICD-10-CM | POA: Diagnosis not present

## 2022-07-05 DIAGNOSIS — S82002A Unspecified fracture of left patella, initial encounter for closed fracture: Secondary | ICD-10-CM | POA: Diagnosis not present

## 2022-07-31 ENCOUNTER — Inpatient Hospital Stay: Payer: Medicare Other | Attending: Physician Assistant

## 2022-07-31 DIAGNOSIS — N1832 Chronic kidney disease, stage 3b: Secondary | ICD-10-CM

## 2022-07-31 DIAGNOSIS — D509 Iron deficiency anemia, unspecified: Secondary | ICD-10-CM | POA: Insufficient documentation

## 2022-07-31 DIAGNOSIS — D631 Anemia in chronic kidney disease: Secondary | ICD-10-CM | POA: Diagnosis not present

## 2022-07-31 DIAGNOSIS — N183 Chronic kidney disease, stage 3 unspecified: Secondary | ICD-10-CM | POA: Insufficient documentation

## 2022-07-31 LAB — IRON AND TIBC
Iron: 50 ug/dL (ref 28–170)
Saturation Ratios: 24 % (ref 10.4–31.8)
TIBC: 212 ug/dL — ABNORMAL LOW (ref 250–450)
UIBC: 162 ug/dL

## 2022-07-31 LAB — CBC WITH DIFFERENTIAL/PLATELET
Abs Immature Granulocytes: 0.02 10*3/uL (ref 0.00–0.07)
Basophils Absolute: 0 10*3/uL (ref 0.0–0.1)
Basophils Relative: 0 %
Eosinophils Absolute: 0.1 10*3/uL (ref 0.0–0.5)
Eosinophils Relative: 1 %
HCT: 36.1 % (ref 36.0–46.0)
Hemoglobin: 11.4 g/dL — ABNORMAL LOW (ref 12.0–15.0)
Immature Granulocytes: 0 %
Lymphocytes Relative: 36 %
Lymphs Abs: 2.5 10*3/uL (ref 0.7–4.0)
MCH: 30.7 pg (ref 26.0–34.0)
MCHC: 31.6 g/dL (ref 30.0–36.0)
MCV: 97.3 fL (ref 80.0–100.0)
Monocytes Absolute: 0.5 10*3/uL (ref 0.1–1.0)
Monocytes Relative: 7 %
Neutro Abs: 3.9 10*3/uL (ref 1.7–7.7)
Neutrophils Relative %: 56 %
Platelets: 237 10*3/uL (ref 150–400)
RBC: 3.71 MIL/uL — ABNORMAL LOW (ref 3.87–5.11)
RDW: 12.9 % (ref 11.5–15.5)
WBC: 7 10*3/uL (ref 4.0–10.5)
nRBC: 0 % (ref 0.0–0.2)

## 2022-07-31 LAB — FERRITIN: Ferritin: 201 ng/mL (ref 11–307)

## 2022-08-04 DIAGNOSIS — M24562 Contracture, left knee: Secondary | ICD-10-CM | POA: Diagnosis not present

## 2022-08-04 DIAGNOSIS — S82002A Unspecified fracture of left patella, initial encounter for closed fracture: Secondary | ICD-10-CM | POA: Diagnosis not present

## 2022-08-04 DIAGNOSIS — S42301A Unspecified fracture of shaft of humerus, right arm, initial encounter for closed fracture: Secondary | ICD-10-CM | POA: Diagnosis not present

## 2022-08-04 DIAGNOSIS — G309 Alzheimer's disease, unspecified: Secondary | ICD-10-CM | POA: Diagnosis not present

## 2022-08-04 DIAGNOSIS — Z7409 Other reduced mobility: Secondary | ICD-10-CM | POA: Diagnosis not present

## 2022-09-04 DIAGNOSIS — S82002A Unspecified fracture of left patella, initial encounter for closed fracture: Secondary | ICD-10-CM | POA: Diagnosis not present

## 2022-09-04 DIAGNOSIS — S42301A Unspecified fracture of shaft of humerus, right arm, initial encounter for closed fracture: Secondary | ICD-10-CM | POA: Diagnosis not present

## 2022-09-04 DIAGNOSIS — G309 Alzheimer's disease, unspecified: Secondary | ICD-10-CM | POA: Diagnosis not present

## 2022-09-04 DIAGNOSIS — Z7409 Other reduced mobility: Secondary | ICD-10-CM | POA: Diagnosis not present

## 2022-09-04 DIAGNOSIS — M24562 Contracture, left knee: Secondary | ICD-10-CM | POA: Diagnosis not present

## 2022-10-05 DIAGNOSIS — S42301A Unspecified fracture of shaft of humerus, right arm, initial encounter for closed fracture: Secondary | ICD-10-CM | POA: Diagnosis not present

## 2022-10-05 DIAGNOSIS — Z7409 Other reduced mobility: Secondary | ICD-10-CM | POA: Diagnosis not present

## 2022-10-05 DIAGNOSIS — S82002A Unspecified fracture of left patella, initial encounter for closed fracture: Secondary | ICD-10-CM | POA: Diagnosis not present

## 2022-10-05 DIAGNOSIS — M24562 Contracture, left knee: Secondary | ICD-10-CM | POA: Diagnosis not present

## 2022-10-05 DIAGNOSIS — G309 Alzheimer's disease, unspecified: Secondary | ICD-10-CM | POA: Diagnosis not present

## 2022-10-24 ENCOUNTER — Inpatient Hospital Stay: Payer: 59 | Attending: Physician Assistant

## 2022-10-24 DIAGNOSIS — R634 Abnormal weight loss: Secondary | ICD-10-CM | POA: Diagnosis not present

## 2022-10-24 DIAGNOSIS — N1832 Chronic kidney disease, stage 3b: Secondary | ICD-10-CM | POA: Diagnosis not present

## 2022-10-24 DIAGNOSIS — D509 Iron deficiency anemia, unspecified: Secondary | ICD-10-CM | POA: Diagnosis not present

## 2022-10-24 DIAGNOSIS — F0390 Unspecified dementia without behavioral disturbance: Secondary | ICD-10-CM | POA: Insufficient documentation

## 2022-10-24 DIAGNOSIS — E1122 Type 2 diabetes mellitus with diabetic chronic kidney disease: Secondary | ICD-10-CM | POA: Insufficient documentation

## 2022-10-24 DIAGNOSIS — E559 Vitamin D deficiency, unspecified: Secondary | ICD-10-CM | POA: Insufficient documentation

## 2022-10-24 DIAGNOSIS — I129 Hypertensive chronic kidney disease with stage 1 through stage 4 chronic kidney disease, or unspecified chronic kidney disease: Secondary | ICD-10-CM | POA: Diagnosis not present

## 2022-10-24 DIAGNOSIS — D631 Anemia in chronic kidney disease: Secondary | ICD-10-CM

## 2022-10-24 DIAGNOSIS — E538 Deficiency of other specified B group vitamins: Secondary | ICD-10-CM | POA: Diagnosis not present

## 2022-10-24 LAB — CBC WITH DIFFERENTIAL/PLATELET
Abs Immature Granulocytes: 0.01 10*3/uL (ref 0.00–0.07)
Basophils Absolute: 0 10*3/uL (ref 0.0–0.1)
Basophils Relative: 1 %
Eosinophils Absolute: 0.1 10*3/uL (ref 0.0–0.5)
Eosinophils Relative: 2 %
HCT: 36.5 % (ref 36.0–46.0)
Hemoglobin: 11.5 g/dL — ABNORMAL LOW (ref 12.0–15.0)
Immature Granulocytes: 0 %
Lymphocytes Relative: 45 %
Lymphs Abs: 2.6 10*3/uL (ref 0.7–4.0)
MCH: 31 pg (ref 26.0–34.0)
MCHC: 31.5 g/dL (ref 30.0–36.0)
MCV: 98.4 fL (ref 80.0–100.0)
Monocytes Absolute: 0.4 10*3/uL (ref 0.1–1.0)
Monocytes Relative: 7 %
Neutro Abs: 2.7 10*3/uL (ref 1.7–7.7)
Neutrophils Relative %: 45 %
Platelets: 209 10*3/uL (ref 150–400)
RBC: 3.71 MIL/uL — ABNORMAL LOW (ref 3.87–5.11)
RDW: 13.5 % (ref 11.5–15.5)
WBC: 5.8 10*3/uL (ref 4.0–10.5)
nRBC: 0 % (ref 0.0–0.2)

## 2022-10-24 LAB — COMPREHENSIVE METABOLIC PANEL
ALT: 9 U/L (ref 0–44)
AST: 17 U/L (ref 15–41)
Albumin: 3.3 g/dL — ABNORMAL LOW (ref 3.5–5.0)
Alkaline Phosphatase: 79 U/L (ref 38–126)
Anion gap: 7 (ref 5–15)
BUN: 25 mg/dL — ABNORMAL HIGH (ref 8–23)
CO2: 22 mmol/L (ref 22–32)
Calcium: 9 mg/dL (ref 8.9–10.3)
Chloride: 109 mmol/L (ref 98–111)
Creatinine, Ser: 1.41 mg/dL — ABNORMAL HIGH (ref 0.44–1.00)
GFR, Estimated: 36 mL/min — ABNORMAL LOW (ref >60)
Glucose, Bld: 187 mg/dL — ABNORMAL HIGH (ref 70–99)
Potassium: 3.8 mmol/L (ref 3.5–5.1)
Sodium: 138 mmol/L (ref 135–145)
Total Bilirubin: 0.5 mg/dL (ref 0.3–1.2)
Total Protein: 6.8 g/dL (ref 6.5–8.1)

## 2022-10-24 LAB — IRON AND TIBC
Iron: 86 ug/dL (ref 28–170)
Saturation Ratios: 36 % — ABNORMAL HIGH (ref 10.4–31.8)
TIBC: 239 ug/dL — ABNORMAL LOW (ref 250–450)
UIBC: 153 ug/dL

## 2022-10-24 LAB — FERRITIN: Ferritin: 143 ng/mL (ref 11–307)

## 2022-10-30 NOTE — Progress Notes (Unsigned)
[place], [place] [address]   CLINIC:  Medical Oncology/Hematology  PCP:  [name], MD [address] [place] [address] [phone]   REASON FOR VISIT:  Follow-up for anemia of CKD and relative iron deficiency   PRIOR THERAPY: Aranesp, as needed IV iron   CURRENT THERAPY: Observation  INTERVAL HISTORY:   Ms. Heintzelman 89 y.o. female returns for routine follow-up of her normocytic anemia.  She was last seen by Tarri Abernethy PA-C on 04/29/2022.  She is accompanied today by her daughter, Tranice. ***   At today's visit, she reports feeling ***.  She denies any recent hospitalizations or changes in her baseline health status.   ***She denies any current signs or symptoms of bleeding such as epistaxis, melena, or bright red blood per rectum.    ***She does have some chronic fatigue, which is at baseline.   ***She denies chest pain, dyspnea, syncope, or palpitations.  ***No fever, chills, night sweats, unintentional weight loss.   She has chronically low energy and ***% appetite. She endorses that she is maintaining a stable weight.  ASSESSMENT & PLAN:  1.  Normocytic anemia - Anemia felt to be secondary to CKD stage IIIb and relative iron deficiency - Patient had an EGD on 01/06/2015 due to hematemesis, which revealed severe erosive gastritis, moderate duodenitis, and multiple duodenal ulcers - She denies any recent hematemesis, bright red blood per rectum, or melena    - SPEP negative for M spike - She previously received Aranesp 50 mcg every 12 weeks as needed for Hgb < 11.0.  (Aranesp last given on 04/19/2020) - Most recent labs (10/24/2022): Hgb 11.5/MCV 98.4, ferritin 143, iron saturation 36 %.  Creatinine 1.41/GFR 36 stable at baseline. - Anemia likely secondary to CKD and relative iron deficiency.  Differential diagnosis also includes MDS, especially in light of mild macrocytosis - however, we will defer any additional  work-up for the time being due to advanced age and mild nature of anemia.   - PLAN: Patient's hemoglobin has been stable for the past year without need for ESA.*** - No indication to restart Aranesp protocol at this time.   - No indication for IV iron at this time.   - Labs only in 3 months   - Repeat CBC/iron panel and RTC in 6 months     2.  Vitamin B12 deficiency: - Most recent labs (04/29/2022) showed B12 level normal at 679.  Methylmalonic acid is normal. - PLAN: Continue daily B12 tablet.  Recheck B12/MMA annually (August 2024)   3.  Chronic kidney disease stage IIIb: - Creatinine has been stable at baseline   4.  Vitamin D deficiency: - Vitamin D deficiency noted on 03/22/2021, with vitamin D low at 19.49 - Patient was instructed to start taking vitamin D 2000 units daily. - Vitamin D today (04/29/2022): 31.78 - PLAN: Continue vitamin D 2000 units daily.  Check levels annually (August 2024)  PLAN SUMMARY: >> *** >> *** >> ***   [facility] **   You were seen today by Tarri Abernethy PA-C for your ***.    *** ***  *** ***  LABS: Return in ***   OTHER TESTS: ***  MEDICATIONS: ***  FOLLOW-UP APPOINTMENT: ***     REVIEW OF SYSTEMS: ***  Review of Systems - Oncology   PHYSICAL EXAM:  ECOG PERFORMANCE STATUS: {CHL ONC ECOG [id number] *** There  were no vitals filed for this visit. There were no vitals filed for this visit. Physical Exam  PAST MEDICAL/SURGICAL HISTORY:  Past Medical History:  Diagnosis Date   Anemia in chronic kidney disease    B12 deficiency anemia    Chronic renal disease, stage 3, moderately decreased glomerular filtration rate (GFR) between 30-59 mL/min/1.73 square meter (HCC)    Dementia (HCC)    Depression    Essential hypertension    GI bleed    Severe gastritis and duodenal ulcers by EGD May 2016    Hyperlipidemia    PSVT (paroxysmal  supraventricular tachycardia) ([place] [facility])    Septic arthritis ([place])    MRSA, left knee    Type 2 diabetes mellitus ([place])    Past Surgical History:  Procedure Laterality Date   CHOLECYSTECTOMY N/A 11/04/2012   Procedure: LAPAROSCOPIC CHOLECYSTECTOMY;  Surgeon: Jamesetta So, MD;  Location: [facility];  Service: General;  Laterality: N/A;   ESOPHAGOGASTRODUODENOSCOPY N/A 01/06/2015   SLF: mild esophagitis   I & D EXTREMITY Left 12/24/2014   Procedure: IRRIGATION AND DEBRIDEMENT EXTREMITY AND ARTHROSCOPY KNEE;  Surgeon: Renette Butters, MD;  Location: [facility];  Service: Orthopedics;  Laterality: Left;   KNEE SURGERY     SAVORY DILATION  01/06/2015   Procedure: SAVORY DILATION;  Surgeon: Danie Binder, MD;  [facility];  Service: Endoscopy;;    SOCIAL HISTORY:  Social History   Socioeconomic History   Marital status: Widowed    Spouse name: Not on file   Number of children: 9   Years of education: Not on file   Highest education level: Not on file  Occupational History   Occupation: retired; Education officer, museum  Tobacco Use   Smoking status: Never   Smokeless tobacco: Never   Tobacco comments:    Never smoked  Substance and Sexual Activity   Alcohol use: No    Alcohol/week: 0.0 standard drinks of alcohol   Drug use: No   Sexual activity: Not Currently  Other Topics Concern   Not on file  Social History Narrative   Daughter Manuela Schwartz lives in her residence. Daughter Bitania Matusek is primary caregiver and decision maker. Nedra Stilp lives in same neighborhood. There are 9 total children and grandchildren and they help out in any way possible.   Social Determinants of Health   Financial Resource Strain: Low Risk  (04/24/2022)   Overall Financial Resource Strain (CARDIA)    Difficulty of Paying Living Expenses: Not hard at all  Food Insecurity: No Food Insecurity (04/24/2022)   Hunger Vital Sign    Worried About Running Out of Food in the Last Year: Never true    Ran Out of Food in the Last Year:  Never true  Transportation Needs: No Transportation Needs (04/24/2022)   PRAPARE - Hydrologist (Medical): No    Lack of Transportation (Non-Medical): No  Physical Activity: Inactive (07/11/2021)   Exercise Vital Sign    Days of Exercise per Week: 0 days    Minutes of Exercise per Session: 0 min  Stress: No Stress Concern Present (07/11/2021)   [place]    Feeling of Stress : Not at all  Social Connections: Socially Isolated (07/11/2021)   Social Connection and Isolation Panel [NHANES]    Frequency of Communication with Friends and Family: More than three times a week    Frequency of Social Gatherings with Friends and Family: More than three  times a week    Attends Religious Services: Never    Active Member of Clubs or Organizations: No    Attends Archivist Meetings: Never    Marital Status: Widowed  Intimate Partner Violence: Not At Risk (07/11/2021)   Humiliation, Afraid, Rape, and Kick questionnaire    Fear of Current or Ex-Partner: No    Emotionally Abused: No    Physically Abused: No    Sexually Abused: No    FAMILY HISTORY:  Family History  Family history unknown: Yes    CURRENT MEDICATIONS:  Outpatient Encounter Medications as of 10/31/2022  Medication Sig   acetaminophen (TYLENOL) 500 MG tablet Take 500 mg by mouth every 6 (six) hours as needed.   aspirin EC 81 MG EC tablet Take 1 tablet (81 mg total) by mouth daily.   diltiazem (CARDIZEM CD) 240 MG 24 hr capsule Take 240 mg by mouth daily.   donepezil (ARICEPT) 10 MG tablet Take 10 mg by mouth at bedtime.    losartan (COZAAR) 100 MG tablet Take 100 mg by mouth daily. Resume per Dr. Willey Blade.   MODERNA COVID-19 VACCINE 100 MCG/0.5ML injection Inject 0.5 mLs into the muscle once.   pantoprazole (PROTONIX) 40 MG tablet TAKE 1 TABLET(40 MG) BY MOUTH DAILY BEFORE BREAKFAST   vitamin B-12 (CYANOCOBALAMIN) 250 MCG tablet Take  250 mcg by mouth daily.   No facility-administered encounter medications on file as of 10/31/2022.    ALLERGIES:  Allergies  Allergen Reactions   Oxycodone Hives    LABORATORY DATA:  I have reviewed the labs as listed.  CBC    Component Value Date/Time   WBC 5.8 10/24/2022 1048   RBC 3.71 (L) 10/24/2022 1048   HGB 11.5 (L) 10/24/2022 1048   HCT 36.5 10/24/2022 1048   PLT 209 10/24/2022 1048   MCV 98.4 10/24/2022 1048   MCH 31.0 10/24/2022 1048   MCHC 31.5 10/24/2022 1048   RDW 13.5 10/24/2022 1048   LYMPHSABS 2.6 10/24/2022 1048   MONOABS 0.4 10/24/2022 1048   EOSABS 0.1 10/24/2022 1048   BASOSABS 0.0 10/24/2022 1048      Latest Ref Rng & Units 10/24/2022   10:48 AM 04/29/2022    1:30 PM 10/25/2021   11:57 AM  CMP  Glucose 70 - 99 mg/dL 187  101  120   BUN 8 - 23 mg/dL 25  28  34   Creatinine 0.44 - 1.00 mg/dL 1.41  1.33  1.37   Sodium 135 - 145 mmol/L 138  143  142   Potassium 3.5 - 5.1 mmol/L 3.8  3.8  3.9   Chloride 98 - 111 mmol/L 109  111  113   CO2 22 - 32 mmol/L '22  24  21   '$ Calcium 8.9 - 10.3 mg/dL 9.0  8.9  8.8   Total Protein 6.5 - 8.1 g/dL 6.8  7.4  7.1   Total Bilirubin 0.3 - 1.2 mg/dL 0.5  0.8  0.5   Alkaline Phos 38 - 126 U/L 79  65  46   AST 15 - 41 U/L '17  14  14   '$ ALT 0 - 44 U/L '9  9  9     '$ DIAGNOSTIC IMAGING:  I have independently reviewed the relevant imaging and discussed with the patient.   WRAP UP:  All questions were answered. The patient knows to call the clinic with any problems, questions or concerns.  Medical decision making: ***  Time spent on visit: I spent ***  minutes counseling the patient face to face. The total time spent in the appointment was *** minutes and more than 50% was on counseling.  Harriett Rush, PA-C  ***

## 2022-10-31 ENCOUNTER — Other Ambulatory Visit: Payer: Self-pay | Admitting: Physician Assistant

## 2022-10-31 ENCOUNTER — Inpatient Hospital Stay: Payer: 59 | Admitting: Physician Assistant

## 2022-10-31 ENCOUNTER — Inpatient Hospital Stay (HOSPITAL_BASED_OUTPATIENT_CLINIC_OR_DEPARTMENT_OTHER): Payer: 59 | Admitting: Physician Assistant

## 2022-10-31 VITALS — BP 120/78 | HR 90 | Temp 98.0°F | Resp 18

## 2022-10-31 DIAGNOSIS — E538 Deficiency of other specified B group vitamins: Secondary | ICD-10-CM

## 2022-10-31 DIAGNOSIS — E559 Vitamin D deficiency, unspecified: Secondary | ICD-10-CM | POA: Diagnosis not present

## 2022-10-31 DIAGNOSIS — N1832 Chronic kidney disease, stage 3b: Secondary | ICD-10-CM

## 2022-10-31 DIAGNOSIS — D631 Anemia in chronic kidney disease: Secondary | ICD-10-CM | POA: Diagnosis not present

## 2022-10-31 DIAGNOSIS — R634 Abnormal weight loss: Secondary | ICD-10-CM | POA: Diagnosis not present

## 2022-10-31 DIAGNOSIS — D509 Iron deficiency anemia, unspecified: Secondary | ICD-10-CM | POA: Diagnosis not present

## 2022-10-31 DIAGNOSIS — E1122 Type 2 diabetes mellitus with diabetic chronic kidney disease: Secondary | ICD-10-CM | POA: Diagnosis not present

## 2022-10-31 DIAGNOSIS — I129 Hypertensive chronic kidney disease with stage 1 through stage 4 chronic kidney disease, or unspecified chronic kidney disease: Secondary | ICD-10-CM | POA: Diagnosis not present

## 2022-10-31 NOTE — Patient Instructions (Signed)
[place] at [facility] Discharge Instructions  You were seen today by Tarri Abernethy PA-C for your anemia (low blood count).  Your blood counts today look great!  You do not need any more Aranesp shots for your blood right now, but we will continue to check your labs every 3 months.  We will schedule a follow up visit in 6 months.  LABS: Return in 12 weeks for repeat labs  OTHER TESTS: None  MEDICATIONS:  -- Continue previously prescribed home medications -- Continue Vitamin D -- Continue Vitamin B12  FOLLOW-UP APPOINTMENT: Office visit in 6 months   Thank you for choosing [place] at [facility] to provide your oncology and hematology care.  To afford each patient quality time with our provider, please arrive at least 15 minutes before your scheduled appointment time.   If you have a lab appointment with the [facility] please come in thru the Main Entrance and check in at the main information desk.  You need to re-schedule your appointment should you arrive 10 or more minutes late.  We strive to give you quality time with our providers, and arriving late affects you and other patients whose appointments are after yours.  Also, if you no show three or more times for appointments you may be dismissed from the clinic at the providers discretion.     Again, thank you for choosing [facility].  Our hope is that these requests will decrease the amount of time that you wait before being seen by our physicians.       _____________________________________________________________  Should you have questions after your visit to [facility], please contact our office at [phone] and follow the prompts.  Our office hours are 8:00 a.m. and 4:30 p.m. Monday - Friday.  Please note that voicemails left after 4:00 p.m. may not be returned until the following business day.  We are closed weekends and major holidays.   You do have access to a nurse 24-7, just call the main number to the clinic [phone] and do not press any options, hold on the line and a nurse will answer the phone.    For prescription refill requests, have your pharmacy contact our office and allow 72 hours.    Due to Covid, you will need to wear a mask upon entering the hospital. If you do not have a mask, a mask will be given to you at the Main Entrance upon arrival. For doctor visits, patients may have 1 support person age 2 or older with them. For treatment visits, patients can not have anyone with them due to social distancing guidelines and our immunocompromised population.

## 2022-11-03 DIAGNOSIS — G309 Alzheimer's disease, unspecified: Secondary | ICD-10-CM | POA: Diagnosis not present

## 2022-11-03 DIAGNOSIS — S42301A Unspecified fracture of shaft of humerus, right arm, initial encounter for closed fracture: Secondary | ICD-10-CM | POA: Diagnosis not present

## 2022-11-03 DIAGNOSIS — S82002A Unspecified fracture of left patella, initial encounter for closed fracture: Secondary | ICD-10-CM | POA: Diagnosis not present

## 2022-11-03 DIAGNOSIS — Z7409 Other reduced mobility: Secondary | ICD-10-CM | POA: Diagnosis not present

## 2022-11-03 DIAGNOSIS — M24562 Contracture, left knee: Secondary | ICD-10-CM | POA: Diagnosis not present

## 2022-12-04 DIAGNOSIS — S82002A Unspecified fracture of left patella, initial encounter for closed fracture: Secondary | ICD-10-CM | POA: Diagnosis not present

## 2022-12-04 DIAGNOSIS — Z7409 Other reduced mobility: Secondary | ICD-10-CM | POA: Diagnosis not present

## 2022-12-04 DIAGNOSIS — M24562 Contracture, left knee: Secondary | ICD-10-CM | POA: Diagnosis not present

## 2022-12-04 DIAGNOSIS — G309 Alzheimer's disease, unspecified: Secondary | ICD-10-CM | POA: Diagnosis not present

## 2022-12-04 DIAGNOSIS — S42301A Unspecified fracture of shaft of humerus, right arm, initial encounter for closed fracture: Secondary | ICD-10-CM | POA: Diagnosis not present

## 2023-01-03 DIAGNOSIS — Z7409 Other reduced mobility: Secondary | ICD-10-CM | POA: Diagnosis not present

## 2023-01-03 DIAGNOSIS — G309 Alzheimer's disease, unspecified: Secondary | ICD-10-CM | POA: Diagnosis not present

## 2023-01-03 DIAGNOSIS — S82002A Unspecified fracture of left patella, initial encounter for closed fracture: Secondary | ICD-10-CM | POA: Diagnosis not present

## 2023-01-03 DIAGNOSIS — S42301A Unspecified fracture of shaft of humerus, right arm, initial encounter for closed fracture: Secondary | ICD-10-CM | POA: Diagnosis not present

## 2023-01-03 DIAGNOSIS — M24562 Contracture, left knee: Secondary | ICD-10-CM | POA: Diagnosis not present

## 2023-01-30 ENCOUNTER — Inpatient Hospital Stay: Payer: 59 | Attending: Physician Assistant

## 2023-01-30 DIAGNOSIS — D631 Anemia in chronic kidney disease: Secondary | ICD-10-CM | POA: Diagnosis not present

## 2023-01-30 DIAGNOSIS — N1832 Chronic kidney disease, stage 3b: Secondary | ICD-10-CM | POA: Insufficient documentation

## 2023-01-30 DIAGNOSIS — E559 Vitamin D deficiency, unspecified: Secondary | ICD-10-CM

## 2023-01-30 LAB — CBC WITH DIFFERENTIAL/PLATELET
Abs Immature Granulocytes: 0.01 10*3/uL (ref 0.00–0.07)
Basophils Absolute: 0 10*3/uL (ref 0.0–0.1)
Basophils Relative: 0 %
Eosinophils Absolute: 0.1 10*3/uL (ref 0.0–0.5)
Eosinophils Relative: 2 %
HCT: 36.5 % (ref 36.0–46.0)
Hemoglobin: 12.1 g/dL (ref 12.0–15.0)
Immature Granulocytes: 0 %
Lymphocytes Relative: 45 %
Lymphs Abs: 3.2 10*3/uL (ref 0.7–4.0)
MCH: 32.9 pg (ref 26.0–34.0)
MCHC: 33.2 g/dL (ref 30.0–36.0)
MCV: 99.2 fL (ref 80.0–100.0)
Monocytes Absolute: 0.7 10*3/uL (ref 0.1–1.0)
Monocytes Relative: 9 %
Neutro Abs: 3.2 10*3/uL (ref 1.7–7.7)
Neutrophils Relative %: 44 %
Platelets: 198 10*3/uL (ref 150–400)
RBC: 3.68 MIL/uL — ABNORMAL LOW (ref 3.87–5.11)
RDW: 13.2 % (ref 11.5–15.5)
WBC: 7.1 10*3/uL (ref 4.0–10.5)
nRBC: 0 % (ref 0.0–0.2)

## 2023-01-30 LAB — COMPREHENSIVE METABOLIC PANEL
ALT: 10 U/L (ref 0–44)
AST: 17 U/L (ref 15–41)
Albumin: 3.5 g/dL (ref 3.5–5.0)
Alkaline Phosphatase: 80 U/L (ref 38–126)
Anion gap: 8 (ref 5–15)
BUN: 20 mg/dL (ref 8–23)
CO2: 25 mmol/L (ref 22–32)
Calcium: 8.9 mg/dL (ref 8.9–10.3)
Chloride: 107 mmol/L (ref 98–111)
Creatinine, Ser: 1.23 mg/dL — ABNORMAL HIGH (ref 0.44–1.00)
GFR, Estimated: 42 mL/min — ABNORMAL LOW (ref >60)
Glucose, Bld: 95 mg/dL (ref 70–99)
Potassium: 3.9 mmol/L (ref 3.5–5.1)
Sodium: 140 mmol/L (ref 135–145)
Total Bilirubin: 0.5 mg/dL (ref 0.3–1.2)
Total Protein: 7 g/dL (ref 6.5–8.1)

## 2023-01-30 LAB — IRON AND TIBC
Iron: 38 ug/dL (ref 28–170)
Saturation Ratios: 16 % (ref 10.4–31.8)
TIBC: 243 ug/dL — ABNORMAL LOW (ref 250–450)
UIBC: 205 ug/dL

## 2023-01-30 LAB — FERRITIN: Ferritin: 116 ng/mL (ref 11–307)

## 2023-04-24 ENCOUNTER — Other Ambulatory Visit: Payer: Self-pay

## 2023-04-24 DIAGNOSIS — D631 Anemia in chronic kidney disease: Secondary | ICD-10-CM

## 2023-04-24 DIAGNOSIS — E559 Vitamin D deficiency, unspecified: Secondary | ICD-10-CM

## 2023-04-25 ENCOUNTER — Inpatient Hospital Stay: Payer: 59 | Attending: Physician Assistant

## 2023-04-25 DIAGNOSIS — D631 Anemia in chronic kidney disease: Secondary | ICD-10-CM | POA: Diagnosis not present

## 2023-04-25 DIAGNOSIS — E538 Deficiency of other specified B group vitamins: Secondary | ICD-10-CM | POA: Diagnosis not present

## 2023-04-25 DIAGNOSIS — N1832 Chronic kidney disease, stage 3b: Secondary | ICD-10-CM | POA: Diagnosis not present

## 2023-04-25 DIAGNOSIS — E559 Vitamin D deficiency, unspecified: Secondary | ICD-10-CM | POA: Insufficient documentation

## 2023-04-25 DIAGNOSIS — F0390 Unspecified dementia without behavioral disturbance: Secondary | ICD-10-CM | POA: Insufficient documentation

## 2023-04-25 DIAGNOSIS — D509 Iron deficiency anemia, unspecified: Secondary | ICD-10-CM | POA: Diagnosis not present

## 2023-04-25 LAB — CBC WITH DIFFERENTIAL/PLATELET
Abs Immature Granulocytes: 0.01 10*3/uL (ref 0.00–0.07)
Basophils Absolute: 0 10*3/uL (ref 0.0–0.1)
Basophils Relative: 1 %
Eosinophils Absolute: 0.3 10*3/uL (ref 0.0–0.5)
Eosinophils Relative: 5 %
HCT: 35.2 % — ABNORMAL LOW (ref 36.0–46.0)
Hemoglobin: 11.8 g/dL — ABNORMAL LOW (ref 12.0–15.0)
Immature Granulocytes: 0 %
Lymphocytes Relative: 46 %
Lymphs Abs: 3 10*3/uL (ref 0.7–4.0)
MCH: 32.8 pg (ref 26.0–34.0)
MCHC: 33.5 g/dL (ref 30.0–36.0)
MCV: 97.8 fL (ref 80.0–100.0)
Monocytes Absolute: 0.6 10*3/uL (ref 0.1–1.0)
Monocytes Relative: 9 %
Neutro Abs: 2.5 10*3/uL (ref 1.7–7.7)
Neutrophils Relative %: 39 %
Platelets: 225 10*3/uL (ref 150–400)
RBC: 3.6 MIL/uL — ABNORMAL LOW (ref 3.87–5.11)
RDW: 13.5 % (ref 11.5–15.5)
WBC: 6.4 10*3/uL (ref 4.0–10.5)
nRBC: 0 % (ref 0.0–0.2)

## 2023-04-25 LAB — COMPREHENSIVE METABOLIC PANEL
ALT: 11 U/L (ref 0–44)
AST: 17 U/L (ref 15–41)
Albumin: 3.5 g/dL (ref 3.5–5.0)
Alkaline Phosphatase: 85 U/L (ref 38–126)
Anion gap: 12 (ref 5–15)
BUN: 23 mg/dL (ref 8–23)
CO2: 22 mmol/L (ref 22–32)
Calcium: 9 mg/dL (ref 8.9–10.3)
Chloride: 104 mmol/L (ref 98–111)
Creatinine, Ser: 1.33 mg/dL — ABNORMAL HIGH (ref 0.44–1.00)
GFR, Estimated: 38 mL/min — ABNORMAL LOW (ref >60)
Glucose, Bld: 119 mg/dL — ABNORMAL HIGH (ref 70–99)
Potassium: 3.9 mmol/L (ref 3.5–5.1)
Sodium: 138 mmol/L (ref 135–145)
Total Bilirubin: 0.8 mg/dL (ref 0.3–1.2)
Total Protein: 7.4 g/dL (ref 6.5–8.1)

## 2023-04-25 LAB — IRON AND TIBC
Iron: 56 ug/dL (ref 28–170)
Saturation Ratios: 22 % (ref 10.4–31.8)
TIBC: 252 ug/dL (ref 250–450)
UIBC: 196 ug/dL

## 2023-04-25 LAB — VITAMIN D 25 HYDROXY (VIT D DEFICIENCY, FRACTURES): Vit D, 25-Hydroxy: 31.58 ng/mL (ref 30–100)

## 2023-04-25 LAB — FERRITIN: Ferritin: 127 ng/mL (ref 11–307)

## 2023-05-01 ENCOUNTER — Inpatient Hospital Stay (HOSPITAL_BASED_OUTPATIENT_CLINIC_OR_DEPARTMENT_OTHER): Payer: 59 | Admitting: Oncology

## 2023-05-01 VITALS — BP 138/63 | HR 85 | Temp 98.7°F | Resp 18

## 2023-05-01 DIAGNOSIS — D631 Anemia in chronic kidney disease: Secondary | ICD-10-CM

## 2023-05-01 DIAGNOSIS — E538 Deficiency of other specified B group vitamins: Secondary | ICD-10-CM | POA: Diagnosis not present

## 2023-05-01 DIAGNOSIS — N1832 Chronic kidney disease, stage 3b: Secondary | ICD-10-CM | POA: Diagnosis not present

## 2023-05-01 DIAGNOSIS — E559 Vitamin D deficiency, unspecified: Secondary | ICD-10-CM | POA: Diagnosis not present

## 2023-05-01 DIAGNOSIS — D509 Iron deficiency anemia, unspecified: Secondary | ICD-10-CM | POA: Diagnosis not present

## 2023-05-01 NOTE — Progress Notes (Signed)
[facility] [address], [place] [address]   CLINIC:  Medical Oncology/Hematology  PCP:  Carylon Perches, MD [address] [place] [address] [phone]   REASON FOR VISIT:  Follow-up for anemia of CKD and relative iron deficiency   PRIOR THERAPY: Aranesp, as needed IV iron   CURRENT THERAPY: Observation  INTERVAL HISTORY:   Ms. [place] 89 y.o. female returns for routine follow-up of her normocytic anemia.  She was last seen by Rojelio Brenner PA-C on 04/29/2022.  She is accompanied today by her daughter.    History is limited secondary to dementia.  Her daughter speaks on her behalf.  Reports her appetite is 75% energy levels are 50%.  Has constipation at times.  Denies any bleeding per rectum, melena or bright red blood.  She denies any recent hospitalizations or changes in her baseline health.  Reports that there are 8 siblings who are constantly checking and caring for their mom.  Denies any palpitations. No fever, chills, night sweats, unintentional weight loss. Reports energy levels are 50% appetite is 75%.  Weight is stable.  ASSESSMENT & PLAN:  1.  Normocytic anemia - Anemia felt to be secondary to CKD stage IIIb and relative iron deficiency - Patient had an EGD on 01/06/2015 due to hematemesis, which revealed severe erosive gastritis, moderate duodenitis, and multiple duodenal ulcers - She denies any recent hematemesis, bright red blood per rectum, or melena    - SPEP negative for M spike - She previously received Aranesp 50 mcg every 12 weeks as needed for Hgb < 11.0.  (Aranesp last given on 04/19/2020) - Most recent labs from 04/25/2023 show fairly stable and unremarkable CMP.  Creatinine 1.33 which is baseline for her.  AST/ALT's are normal.  CBC shows hemoglobin 11.8 with MCV 97.8.  Differential is normal.  Anemia panel shows iron saturation is 22% with a ferritin of 127. - Anemia likely secondary to CKD and iron deficiency. -No  indication to reinitiate Aranesp or iron infusion at this time.   2.  Vitamin B12 deficiency: - Most recent labs (04/29/2022) showed B12 level normal at 679.  Methylmalonic acid is normal. -Will recheck B12 level at next visit.   3.  Chronic kidney disease stage IIIb: - Creatinine has been stable at baseline.  -Will continue to monitor kidney levels.   4.  Vitamin D deficiency: - Vitamin D deficiency noted on 03/22/2021, with vitamin D low at 19.49 - Patient was instructed to start taking vitamin D 2000 units daily. - Vitamin D today (04/30/2023): 31.78 -Continue vitamin D supplements.  PLAN SUMMARY: >> Continue vitamin D 2000 units daily. >> Return to clinic in 3 months for labs only and in 6 months for labs and office visit.     REVIEW OF SYSTEMS:   Review of Systems  Constitutional:  Positive for fatigue.  Eyes:  Positive for eye problems.  Gastrointestinal:  Positive for constipation.  Genitourinary:  Positive for dysuria.      PHYSICAL EXAM:  ECOG PERFORMANCE STATUS: 3 - Symptomatic, >50% confined to bed  Vitals:   05/01/23 1120  BP: 138/63  Pulse: 85  Resp: 18  Temp: 98.7 F (37.1 C)  SpO2: 97%   There were no vitals filed for this visit. Physical Exam Constitutional:      Appearance: Normal appearance.  Cardiovascular:     Rate and Rhythm: Normal rate and regular rhythm.  Pulmonary:     Effort: Pulmonary effort is normal.  Breath sounds: Normal breath sounds.  Abdominal:     General: Bowel sounds are normal.     Palpations: Abdomen is soft.  Musculoskeletal:        General: No swelling. Normal range of motion.  Neurological:     Mental Status: She is alert and oriented to person, place, and time. Mental status is at baseline.     PAST MEDICAL/SURGICAL HISTORY:  Past Medical History:  Diagnosis Date   Anemia in chronic kidney disease    B12 deficiency anemia    Chronic renal disease, stage 3, moderately decreased glomerular filtration rate (GFR)  between 30-59 mL/min/1.73 square meter (HCC)    Dementia (HCC)    Depression    Essential hypertension    GI bleed    Severe gastritis and duodenal ulcers by EGD May 2016    Hyperlipidemia    PSVT (paroxysmal supraventricular tachycardia) (HCC)    Septic arthritis (HCC)    MRSA, left knee    Type 2 diabetes mellitus (HCC)    Past Surgical History:  Procedure Laterality Date   CHOLECYSTECTOMY N/A 11/04/2012   Procedure: LAPAROSCOPIC CHOLECYSTECTOMY;  Surgeon: Dalia Heading, MD;  Location: [facility] ORS;  Service: General;  Laterality: N/A;   ESOPHAGOGASTRODUODENOSCOPY N/A 01/06/2015   SLF: mild esophagitis   I & D EXTREMITY Left 12/24/2014   Procedure: IRRIGATION AND DEBRIDEMENT EXTREMITY AND ARTHROSCOPY KNEE;  Surgeon: Sheral Apley, MD;  Location: [facility] OR;  Service: Orthopedics;  Laterality: Left;   KNEE SURGERY     SAVORY DILATION  01/06/2015   Procedure: SAVORY DILATION;  Surgeon: West Bali, MD;  Location: [facility] ENDO SUITE;  Service: Endoscopy;;    SOCIAL HISTORY:  Social History   Socioeconomic History   Marital status: Widowed    Spouse name: Not on file   Number of children: 9   Years of education: Not on file   Highest education level: Not on file  Occupational History   Occupation: retired; Nature conservation officer  Tobacco Use   Smoking status: Never   Smokeless tobacco: Never   Tobacco comments:    Never smoked  Substance and Sexual Activity   Alcohol use: No    Alcohol/week: 0.0 standard drinks of alcohol   Drug use: No   Sexual activity: Not Currently  Other Topics Concern   Not on file  Social History Narrative   Daughter Darl Pikes lives in her residence. Daughter Farzana Tannenbaum is primary caregiver and decision maker. Hartley Bury lives in same neighborhood. There are 9 total children and grandchildren and they help out in any way possible.   Social Determinants of Health   Financial Resource Strain: Low Risk  (04/24/2022)   Overall Financial Resource Strain (CARDIA)    Difficulty of  Paying Living Expenses: Not hard at all  Food Insecurity: No Food Insecurity (04/24/2022)   Hunger Vital Sign    Worried About Running Out of Food in the Last Year: Never true    Ran Out of Food in the Last Year: Never true  Transportation Needs: No Transportation Needs (04/24/2022)   PRAPARE - Administrator, Civil Service (Medical): No    Lack of Transportation (Non-Medical): No  Physical Activity: Inactive (07/11/2021)   Exercise Vital Sign    Days of Exercise per Week: 0 days    Minutes of Exercise per Session: 0 min  Stress: No Stress Concern Present (07/11/2021)   [employer] of Occupational Health - Occupational Stress Questionnaire    Feeling of  Stress : Not at all  Social Connections: Socially Isolated (07/11/2021)   Social Connection and Isolation Panel [NHANES]    Frequency of Communication with Friends and Family: More than three times a week    Frequency of Social Gatherings with Friends and Family: More than three times a week    Attends Religious Services: Never    Database administrator or Organizations: No    Attends Banker Meetings: Never    Marital Status: Widowed  Intimate Partner Violence: Not At Risk (07/11/2021)   Humiliation, Afraid, Rape, and Kick questionnaire    Fear of Current or Ex-Partner: No    Emotionally Abused: No    Physically Abused: No    Sexually Abused: No    FAMILY HISTORY:  Family History  Family history unknown: Yes    CURRENT MEDICATIONS:  Outpatient Encounter Medications as of 05/01/2023  Medication Sig   acetaminophen (TYLENOL) 500 MG tablet Take 500 mg by mouth every 6 (six) hours as needed.   aspirin EC 81 MG EC tablet Take 1 tablet (81 mg total) by mouth daily.   diltiazem (CARDIZEM CD) 240 MG 24 hr capsule Take 240 mg by mouth daily.   donepezil (ARICEPT) 10 MG tablet Take 10 mg by mouth at bedtime.    losartan (COZAAR) 100 MG tablet Take 100 mg by mouth daily. Resume per Dr. Ouida Sills.   MODERNA  COVID-19 VACCINE 100 MCG/0.5ML injection Inject 0.5 mLs into the muscle once.   pantoprazole (PROTONIX) 40 MG tablet TAKE 1 TABLET(40 MG) BY MOUTH DAILY BEFORE BREAKFAST   vitamin B-12 (CYANOCOBALAMIN) 250 MCG tablet Take 250 mcg by mouth daily.   No facility-administered encounter medications on file as of 05/01/2023.    ALLERGIES:  Allergies  Allergen Reactions   Oxycodone Hives    LABORATORY DATA:  I have reviewed the labs as listed.  CBC    Component Value Date/Time   WBC 6.4 04/25/2023 1335   RBC 3.60 (L) 04/25/2023 1335   HGB 11.8 (L) 04/25/2023 1335   HCT 35.2 (L) 04/25/2023 1335   PLT 225 04/25/2023 1335   MCV 97.8 04/25/2023 1335   MCH 32.8 04/25/2023 1335   MCHC 33.5 04/25/2023 1335   RDW 13.5 04/25/2023 1335   LYMPHSABS 3.0 04/25/2023 1335   MONOABS 0.6 04/25/2023 1335   EOSABS 0.3 04/25/2023 1335   BASOSABS 0.0 04/25/2023 1335      Latest Ref Rng & Units 04/25/2023    1:35 PM 01/30/2023    2:30 PM 10/24/2022   10:48 AM  CMP  Glucose 70 - 99 mg/dL 409  95  811   BUN 8 - 23 mg/dL 23  20  25    Creatinine 0.44 - 1.00 mg/dL 9.14  7.82  9.56   Sodium 135 - 145 mmol/L 138  140  138   Potassium 3.5 - 5.1 mmol/L 3.9  3.9  3.8   Chloride 98 - 111 mmol/L 104  107  109   CO2 22 - 32 mmol/L 22  25  22    Calcium 8.9 - 10.3 mg/dL 9.0  8.9  9.0   Total Protein 6.5 - 8.1 g/dL 7.4  7.0  6.8   Total Bilirubin 0.3 - 1.2 mg/dL 0.8  0.5  0.5   Alkaline Phos 38 - 126 U/L 85  80  79   AST 15 - 41 U/L 17  17  17    ALT 0 - 44 U/L 11  10  9  DIAGNOSTIC IMAGING:  I have independently reviewed the relevant imaging and discussed with the patient.   WRAP UP:  All questions were answered. The patient knows to call the clinic with any problems, questions or concerns.  Medical decision making: Moderate  Time spent on visit:I spent 20 minutes dedicated to the care of this patient (face-to-face and non-face-to-face) on the date of the encounter to include what is described in the  assessment and plan.   Mauro Kaufmann, NP  10/31/2022 2:57 PM

## 2023-07-15 ENCOUNTER — Other Ambulatory Visit: Payer: Self-pay

## 2023-07-15 ENCOUNTER — Encounter (HOSPITAL_COMMUNITY): Payer: Self-pay | Admitting: Emergency Medicine

## 2023-07-15 ENCOUNTER — Observation Stay (HOSPITAL_COMMUNITY)
Admission: EM | Admit: 2023-07-15 | Discharge: 2023-07-17 | Disposition: A | Discharge status: Home / Self Care | Payer: 59 | Attending: Internal Medicine | Admitting: Internal Medicine

## 2023-07-15 DIAGNOSIS — N183 Chronic kidney disease, stage 3 unspecified: Secondary | ICD-10-CM | POA: Insufficient documentation

## 2023-07-15 DIAGNOSIS — N179 Acute kidney failure, unspecified: Secondary | ICD-10-CM | POA: Diagnosis not present

## 2023-07-15 DIAGNOSIS — N3001 Acute cystitis with hematuria: Secondary | ICD-10-CM | POA: Diagnosis not present

## 2023-07-15 DIAGNOSIS — I129 Hypertensive chronic kidney disease with stage 1 through stage 4 chronic kidney disease, or unspecified chronic kidney disease: Secondary | ICD-10-CM | POA: Insufficient documentation

## 2023-07-15 DIAGNOSIS — B9620 Unspecified Escherichia coli [E. coli] as the cause of diseases classified elsewhere: Secondary | ICD-10-CM | POA: Insufficient documentation

## 2023-07-15 DIAGNOSIS — R531 Weakness: Secondary | ICD-10-CM

## 2023-07-15 DIAGNOSIS — N390 Urinary tract infection, site not specified: Secondary | ICD-10-CM | POA: Diagnosis not present

## 2023-07-15 DIAGNOSIS — Z7982 Long term (current) use of aspirin: Secondary | ICD-10-CM | POA: Insufficient documentation

## 2023-07-15 DIAGNOSIS — R197 Diarrhea, unspecified: Secondary | ICD-10-CM | POA: Insufficient documentation

## 2023-07-15 DIAGNOSIS — Z79899 Other long term (current) drug therapy: Secondary | ICD-10-CM | POA: Insufficient documentation

## 2023-07-15 DIAGNOSIS — A045 Campylobacter enteritis: Secondary | ICD-10-CM | POA: Insufficient documentation

## 2023-07-15 DIAGNOSIS — K219 Gastro-esophageal reflux disease without esophagitis: Secondary | ICD-10-CM | POA: Diagnosis present

## 2023-07-15 DIAGNOSIS — F0390 Unspecified dementia without behavioral disturbance: Secondary | ICD-10-CM | POA: Diagnosis not present

## 2023-07-15 DIAGNOSIS — E1122 Type 2 diabetes mellitus with diabetic chronic kidney disease: Secondary | ICD-10-CM | POA: Insufficient documentation

## 2023-07-15 DIAGNOSIS — I10 Essential (primary) hypertension: Secondary | ICD-10-CM | POA: Diagnosis present

## 2023-07-15 DIAGNOSIS — R000 Tachycardia, unspecified: Secondary | ICD-10-CM | POA: Diagnosis not present

## 2023-07-15 LAB — URINALYSIS, ROUTINE W REFLEX MICROSCOPIC
Bilirubin Urine: NEGATIVE
Glucose, UA: NEGATIVE mg/dL
Ketones, ur: 5 mg/dL — AB
Nitrite: NEGATIVE
Protein, ur: 100 mg/dL — AB
Specific Gravity, Urine: 1.024 (ref 1.005–1.030)
WBC, UA: >50 WBC/hpf (ref 0–5)
pH: 7 (ref 5.0–8.0)

## 2023-07-15 LAB — BASIC METABOLIC PANEL
Anion gap: 10 (ref 5–15)
BUN: 28 mg/dL — ABNORMAL HIGH (ref 8–23)
CO2: 20 mmol/L — ABNORMAL LOW (ref 22–32)
Calcium: 8.7 mg/dL — ABNORMAL LOW (ref 8.9–10.3)
Chloride: 110 mmol/L (ref 98–111)
Creatinine, Ser: 1.98 mg/dL — ABNORMAL HIGH (ref 0.44–1.00)
GFR, Estimated: 24 mL/min — ABNORMAL LOW (ref >60)
Glucose, Bld: 190 mg/dL — ABNORMAL HIGH (ref 70–99)
Potassium: 3.6 mmol/L (ref 3.5–5.1)
Sodium: 140 mmol/L (ref 135–145)

## 2023-07-15 LAB — C DIFFICILE QUICK SCREEN W PCR REFLEX
C Diff antigen: NEGATIVE
C Diff interpretation: NOT DETECTED
C Diff toxin: NEGATIVE

## 2023-07-15 LAB — CBC
HCT: 40.6 % (ref 36.0–46.0)
Hemoglobin: 13 g/dL (ref 12.0–15.0)
MCH: 31.4 pg (ref 26.0–34.0)
MCHC: 32 g/dL (ref 30.0–36.0)
MCV: 98.1 fL (ref 80.0–100.0)
Platelets: 268 10*3/uL (ref 150–400)
RBC: 4.14 MIL/uL (ref 3.87–5.11)
RDW: 13.5 % (ref 11.5–15.5)
WBC: 5.8 10*3/uL (ref 4.0–10.5)
nRBC: 0 % (ref 0.0–0.2)

## 2023-07-15 MED ORDER — SODIUM CHLORIDE 0.9 % IV BOLUS
1000.0000 mL | Freq: Once | INTRAVENOUS | Status: AC
  Administered 2023-07-15: 1000 mL via INTRAVENOUS

## 2023-07-15 MED ORDER — CIPROFLOXACIN IN D5W 400 MG/200ML IV SOLN
400.0000 mg | Freq: Once | INTRAVENOUS | Status: AC
  Administered 2023-07-15: 400 mg via INTRAVENOUS
  Filled 2023-07-15: qty 200

## 2023-07-15 NOTE — ED Provider Notes (Signed)
[place] EMERGENCY DEPARTMENT AT [facility] Provider Note   CSN: [id number] Arrival date & time: 07/15/23  1648     History  Chief Complaint  Patient presents with   Weakness    Laura Davenport is a 89 y.o. female.  Patient has been having diarrhea for couple days.  And according to her daughter she is much weaker than normal.  She has a history of significant dementia  The history is provided by the patient and medical records. No language interpreter was used.  Weakness Severity:  Moderate Onset quality:  Sudden Timing:  Constant Progression:  Worsening Chronicity:  New Context: not alcohol use   Relieved by:  Nothing Worsened by:  Nothing Ineffective treatments:  None tried Associated symptoms: no abdominal pain, no arthralgias, no chest pain, no cough, no dysuria, no fever, no seizures, no shortness of breath and no vomiting        Home Medications Prior to Admission medications   Medication Sig Start Date End Date Taking? Authorizing Provider  acetaminophen (TYLENOL) 500 MG tablet Take 500 mg by mouth every 6 (six) hours as needed for mild pain (pain score 1-3).   Yes [provider]  aspirin EC 81 MG EC tablet Take 1 tablet (81 mg total) by mouth daily. 02/03/19  Yes Johnson, Clanford L, MD  diltiazem (CARDIZEM CD) 240 MG 24 hr capsule Take 240 mg by mouth daily. 07/06/21  Yes [provider]  donepezil (ARICEPT) 10 MG tablet Take 10 mg by mouth at bedtime.  05/07/18  Yes [provider]  pantoprazole (PROTONIX) 40 MG tablet TAKE 1 TABLET(40 MG) BY MOUTH DAILY BEFORE BREAKFAST 02/15/21  Yes Gelene Mink, NP  vitamin B-12 (CYANOCOBALAMIN) 250 MCG tablet Take 250 mcg by mouth daily.   Yes [provider]      Allergies    Oxycodone    Review of Systems   Review of Systems  Constitutional:  Negative for chills and fever.  HENT:  Negative for ear pain and sore throat.   Eyes:  Negative for pain and visual disturbance.   Respiratory:  Negative for cough and shortness of breath.   Cardiovascular:  Negative for chest pain and palpitations.  Gastrointestinal:  Negative for abdominal pain and vomiting.  Genitourinary:  Negative for dysuria and hematuria.  Musculoskeletal:  Negative for arthralgias and back pain.  Skin:  Negative for color change and rash.  Neurological:  Positive for weakness. Negative for seizures and syncope.  All other systems reviewed and are negative.   Physical Exam Updated Vital Signs BP 129/79 (BP Location: Right Arm)   Pulse (!) 107   Temp 98.1 F (36.7 C) (Oral)   Resp 18   Ht 5' 3.5" (1.613 m)   Wt 59.4 kg   SpO2 96%   BMI 22.84 kg/m  Physical Exam Vitals and nursing note reviewed.  Constitutional:      Appearance: She is well-developed.  HENT:     Head: Normocephalic.     Nose: Nose normal.  Eyes:     General: No scleral icterus.    Conjunctiva/sclera: Conjunctivae normal.  Neck:     Thyroid: No thyromegaly.  Cardiovascular:     Rate and Rhythm: Normal rate and regular rhythm.     Heart sounds: No murmur heard.    No friction rub. No gallop.  Pulmonary:     Breath sounds: No stridor. No wheezing or rales.  Chest:     Chest wall: No  tenderness.  Abdominal:     General: There is no distension.     Tenderness: There is no abdominal tenderness. There is no rebound.  Musculoskeletal:        General: Normal range of motion.     Cervical back: Neck supple.  Lymphadenopathy:     Cervical: No cervical adenopathy.  Skin:    Findings: No erythema or rash.  Neurological:     Mental Status: She is alert and oriented to person, place, and time.     Motor: No abnormal muscle tone.     Coordination: Coordination normal.  Psychiatric:        Behavior: Behavior normal.     ED Results / Procedures / Treatments   Labs (all labs ordered are listed, but only abnormal results are displayed) Labs Reviewed  BASIC METABOLIC PANEL - Abnormal; Notable for the following  components:      Result Value   CO2 20 (*)    Glucose, Bld 190 (*)    BUN 28 (*)    Creatinine, Ser 1.98 (*)    Calcium 8.7 (*)    GFR, Estimated 24 (*)    All other components within normal limits  URINALYSIS, ROUTINE W REFLEX MICROSCOPIC - Abnormal; Notable for the following components:   APPearance TURBID (*)    Hgb urine dipstick SMALL (*)    Ketones, ur 5 (*)    Protein, ur 100 (*)    Leukocytes,Ua SMALL (*)    Bacteria, UA MANY (*)    All other components within normal limits  C DIFFICILE QUICK SCREEN W PCR REFLEX    GASTROINTESTINAL PANEL BY PCR, STOOL (REPLACES STOOL CULTURE)  URINE CULTURE  CBC  CBG MONITORING, ED    EKG None  Radiology No results found.  Procedures Procedures    Medications Ordered in ED Medications  ciprofloxacin (CIPRO) IVPB 400 mg (has no administration in time range)  sodium chloride 0.9 % bolus 1,000 mL (1,000 mLs Intravenous New Bag/Given 07/15/23 2109)    ED Course/ Medical Decision Making/ A&P                                 Medical Decision Making Amount and/or Complexity of Data Reviewed Labs: ordered.  Risk Prescription drug management. Decision regarding hospitalization.  This patient presents to the ED for concern of weakness, this involves an extensive number of treatment options, and is a complaint that carries with it a high risk of complications and morbidity.  The differential diagnosis includes dehydration, anemia   Co morbidities that complicate the patient evaluation Dementia   Additional history obtained:  Additional history obtained from family External records from outside source obtained and reviewed including hospital records   Lab Tests:  I Ordered, and personally interpreted labs.  The pertinent results include: Urine shows many bacteria and UTI   Imaging Studies ordered:  No imaging  Cardiac Monitoring: / EKG:  The patient was maintained on a cardiac monitor.  I personally viewed and  interpreted the cardiac monitored which showed an underlying rhythm of: Normal sinus rhythm   Consultations Obtained:  I requested consultation with the hospitalist,  and discussed lab and imaging findings as well as pertinent plan - they recommend: Admit   Problem List / ED Course / Critical interventions / Medication management  UTI and dehydration I ordered medication including antibiotics for UTI Reevaluation of the patient after these medicines showed that  the patient stayed the same I have reviewed the patients home medicines and have made adjustments as needed   Social Determinants of Health:  None   Test / Admission - Considered:  None  Patient has urinary tract infection and AKI.  She will be admitted to medicine        Final Clinical Impression(s) / ED Diagnoses Final diagnoses:  AKI (acute kidney injury) (HCC)  Acute cystitis with hematuria    Rx / DC Orders ED Discharge Orders     None         Bethann Berkshire, MD 07/19/23 1006

## 2023-07-15 NOTE — ED Triage Notes (Signed)
Pt BIB Daughter who reports pt has had a decreased appetite since yesterday with weakness, daughter reports concern for dark stool that could have been blood x 1 episode and concerned for UTI

## 2023-07-15 NOTE — ED Notes (Signed)
Pt has been cleaned up and a full bed changed due to BM. Pt is resting at this time.

## 2023-07-16 DIAGNOSIS — R531 Weakness: Secondary | ICD-10-CM | POA: Diagnosis not present

## 2023-07-16 DIAGNOSIS — K219 Gastro-esophageal reflux disease without esophagitis: Secondary | ICD-10-CM | POA: Diagnosis not present

## 2023-07-16 DIAGNOSIS — I10 Essential (primary) hypertension: Secondary | ICD-10-CM | POA: Diagnosis not present

## 2023-07-16 DIAGNOSIS — N179 Acute kidney failure, unspecified: Secondary | ICD-10-CM | POA: Diagnosis not present

## 2023-07-16 DIAGNOSIS — F0280 Dementia in other diseases classified elsewhere without behavioral disturbance: Secondary | ICD-10-CM | POA: Diagnosis not present

## 2023-07-16 DIAGNOSIS — N3000 Acute cystitis without hematuria: Secondary | ICD-10-CM

## 2023-07-16 DIAGNOSIS — N390 Urinary tract infection, site not specified: Secondary | ICD-10-CM | POA: Insufficient documentation

## 2023-07-16 DIAGNOSIS — R197 Diarrhea, unspecified: Secondary | ICD-10-CM | POA: Insufficient documentation

## 2023-07-16 LAB — COMPREHENSIVE METABOLIC PANEL
ALT: 9 U/L (ref 0–44)
AST: 14 U/L — ABNORMAL LOW (ref 15–41)
Albumin: 3.2 g/dL — ABNORMAL LOW (ref 3.5–5.0)
Alkaline Phosphatase: 77 U/L (ref 38–126)
Anion gap: 4 — ABNORMAL LOW (ref 5–15)
BUN: 27 mg/dL — ABNORMAL HIGH (ref 8–23)
CO2: 21 mmol/L — ABNORMAL LOW (ref 22–32)
Calcium: 8.3 mg/dL — ABNORMAL LOW (ref 8.9–10.3)
Chloride: 117 mmol/L — ABNORMAL HIGH (ref 98–111)
Creatinine, Ser: 1.63 mg/dL — ABNORMAL HIGH (ref 0.44–1.00)
GFR, Estimated: 30 mL/min — ABNORMAL LOW (ref >60)
Glucose, Bld: 91 mg/dL (ref 70–99)
Potassium: 3.2 mmol/L — ABNORMAL LOW (ref 3.5–5.1)
Sodium: 142 mmol/L (ref 135–145)
Total Bilirubin: 0.6 mg/dL (ref <1.2)
Total Protein: 6.7 g/dL (ref 6.5–8.1)

## 2023-07-16 LAB — CBC WITH DIFFERENTIAL/PLATELET
Abs Immature Granulocytes: 0.02 10*3/uL (ref 0.00–0.07)
Basophils Absolute: 0 10*3/uL (ref 0.0–0.1)
Basophils Relative: 0 %
Eosinophils Absolute: 0.1 10*3/uL (ref 0.0–0.5)
Eosinophils Relative: 2 %
HCT: 33.8 % — ABNORMAL LOW (ref 36.0–46.0)
Hemoglobin: 11.1 g/dL — ABNORMAL LOW (ref 12.0–15.0)
Immature Granulocytes: 0 %
Lymphocytes Relative: 41 %
Lymphs Abs: 2 10*3/uL (ref 0.7–4.0)
MCH: 31.8 pg (ref 26.0–34.0)
MCHC: 32.8 g/dL (ref 30.0–36.0)
MCV: 96.8 fL (ref 80.0–100.0)
Monocytes Absolute: 0.8 10*3/uL (ref 0.1–1.0)
Monocytes Relative: 16 %
Neutro Abs: 2 10*3/uL (ref 1.7–7.7)
Neutrophils Relative %: 41 %
Platelets: 225 10*3/uL (ref 150–400)
RBC: 3.49 MIL/uL — ABNORMAL LOW (ref 3.87–5.11)
RDW: 13.6 % (ref 11.5–15.5)
WBC: 4.8 10*3/uL (ref 4.0–10.5)
nRBC: 0 % (ref 0.0–0.2)

## 2023-07-16 LAB — GASTROINTESTINAL PANEL BY PCR, STOOL (REPLACES STOOL CULTURE)

## 2023-07-16 LAB — MAGNESIUM: Magnesium: 2.1 mg/dL (ref 1.7–2.4)

## 2023-07-16 MED ORDER — DILTIAZEM HCL ER COATED BEADS 240 MG PO CP24
240.0000 mg | ORAL_CAPSULE | Freq: Every day | ORAL | Status: DC
  Administered 2023-07-16: 240 mg via ORAL
  Filled 2023-07-16 (×2): qty 1

## 2023-07-16 MED ORDER — POTASSIUM CHLORIDE CRYS ER 20 MEQ PO TBCR
40.0000 meq | EXTENDED_RELEASE_TABLET | Freq: Two times a day (BID) | ORAL | Status: AC
  Administered 2023-07-16 (×2): 40 meq via ORAL
  Filled 2023-07-16 (×2): qty 2

## 2023-07-16 MED ORDER — ONDANSETRON HCL 4 MG/2ML IJ SOLN: 4.0000 mg | Freq: Four times a day (QID) | INTRAMUSCULAR | Status: DC | PRN

## 2023-07-16 MED ORDER — HEPARIN SODIUM (PORCINE) 5000 UNIT/ML IJ SOLN: 5000.0000 [IU] | Freq: Three times a day (TID) | INTRAMUSCULAR | Status: DC

## 2023-07-16 MED ORDER — ORAL CARE MOUTH RINSE: 15.0000 mL | OROMUCOSAL | Status: DC | PRN

## 2023-07-16 MED ORDER — DONEPEZIL HCL 5 MG PO TABS
10.0000 mg | ORAL_TABLET | Freq: Every day | ORAL | Status: DC
  Administered 2023-07-16: 10 mg via ORAL
  Filled 2023-07-16: qty 2

## 2023-07-16 MED ORDER — ACETAMINOPHEN 325 MG PO TABS: 650.0000 mg | ORAL_TABLET | Freq: Four times a day (QID) | ORAL | Status: DC | PRN

## 2023-07-16 MED ORDER — SODIUM CHLORIDE 0.9 % IV SOLN
INTRAVENOUS | Status: AC
Start: 2023-07-16 — End: 2023-07-17

## 2023-07-16 MED ORDER — ACETAMINOPHEN 650 MG RE SUPP
650.0000 mg | Freq: Four times a day (QID) | RECTAL | Status: DC | PRN
Start: 2023-07-16 — End: 2023-07-17

## 2023-07-16 MED ORDER — HEPARIN SODIUM (PORCINE) 5000 UNIT/ML IJ SOLN
5000.0000 [IU] | Freq: Three times a day (TID) | INTRAMUSCULAR | Status: DC
  Administered 2023-07-16 – 2023-07-17 (×4): 5000 [IU] via SUBCUTANEOUS
  Filled 2023-07-16 (×4): qty 1

## 2023-07-16 MED ORDER — ASPIRIN 81 MG PO TBEC
81.0000 mg | DELAYED_RELEASE_TABLET | Freq: Every day | ORAL | Status: DC
  Administered 2023-07-16 – 2023-07-17 (×2): 81 mg via ORAL
  Filled 2023-07-16 (×2): qty 1

## 2023-07-16 MED ORDER — SODIUM CHLORIDE 0.9 % IV SOLN
1.0000 g | INTRAVENOUS | Status: DC
  Administered 2023-07-16 – 2023-07-17 (×2): 1 g via INTRAVENOUS
  Filled 2023-07-16 (×2): qty 10

## 2023-07-16 MED ORDER — OXYCODONE HCL 5 MG PO TABS
5.0000 mg | ORAL_TABLET | ORAL | Status: DC | PRN
Start: 2023-07-16 — End: 2023-07-17

## 2023-07-16 MED ORDER — ONDANSETRON HCL 4 MG PO TABS: 4.0000 mg | ORAL_TABLET | Freq: Four times a day (QID) | ORAL | Status: DC | PRN

## 2023-07-16 NOTE — Progress Notes (Addendum)
Physical Therapy Discharge Patient Details Name: Laura Davenport MRN: [id number] DOB: 29-May-1931 Today's Date: 07/16/2023 Time:  -     Patient discharged from PT services secondary to  PT screened, no needs identified, will sign off .  Second attempt at physical therapy evaluation.  Patient's oldest daughter present in the room feeding patient.  Screened and discussed with patient's daughter PLOF prior to admission.  Patient's daughter reporting that patient lives with younger daughter 24/7, and both older and younger daughter assist with ADLs and transfers to from bed to chair.  Per daughter, patient performs transfers with intermittent assist into standing for 5-10 seconds and then needs to sit.  Patient's daughter reports that patient only does what she wants to do and limited participation with other individuals  normal except with both daughters.  Discussed with patient's daughter at current level if any functional changes or decline in functional status has occurred, patient's daughter reports no major differences and functional transfers and mobility.  Per daughter, patient is at functional baseline.  Recommendations are to discharge home at current level as daughter reporting this is baseline level.  Could potentially benefit from home health physical therapy services to assist with caregiver education.    PT to sign off on patient as no acute physical therapy services are indicated at this time based upon patient's functional level, is not a candidate for level of inpatient rehab services based upon cognition status and functional mobility at baseline.  Nelida Meuse PT, DPT Physical Therapist with [facility] [phone] office       Nelida Meuse 07/16/2023, 2:30 PM

## 2023-07-16 NOTE — H&P (Signed)
History and Physical    Patient: Laura Davenport DOB: 1931/02/01 DOA: 07/15/2023 DOS: the patient was seen and examined on 07/16/2023 PCP: Laura Perches, MD  Patient coming from: Home  Chief Complaint:  Chief Complaint  Patient presents with   Weakness   HPI: Laura Davenport is a 89 y.o. female with medical history significant of hyperlipidemia, paroxysmal supraventricular tachycardia, dementia, hypertension, CKD, presents to the ED with a chief complaint of decreased appetite, weakness, diarrhea.  Daughter was concern for dark stool but patient's stool was heme-negative and did not appear dark to the ED provider.  Patient is not able to provide any history due to her dementia.  Daughter had reported she was much weaker than normal.  Patient did have a couple loose stools in the ED so I C. difficile was done that was negative.  Patient was found to have AKI and UTI.  IV hydration was started in the ED.  Admission was requested for generalized weakness.  Patient does have a living will but states she would not want her life to be prolonged by life-sustaining procedures if she was terminally ill permanently in a coma or suffer severe dementia.  Patient does have dementia her son interpreting this is DNR at this time.  No further history could be obtained at this time. Review of Systems: unable to review all systems due to the inability of the patient to answer questions. Past Medical History:  Diagnosis Date   Anemia in chronic kidney disease    B12 deficiency anemia    Chronic renal disease, stage 3, moderately decreased glomerular filtration rate (GFR) between 30-59 mL/min/1.73 square meter (HCC)    Dementia (HCC)    Depression    Essential hypertension    GI bleed    Severe gastritis and duodenal ulcers by EGD May 2016    Hyperlipidemia    PSVT (paroxysmal supraventricular tachycardia) (HCC)    Septic arthritis (HCC)    MRSA, left knee    Type 2 diabetes mellitus (HCC)     Past Surgical History:  Procedure Laterality Date   CHOLECYSTECTOMY N/A 11/04/2012   Procedure: LAPAROSCOPIC CHOLECYSTECTOMY;  Surgeon: Laura Heading, MD;  Location: [facility];  Service: General;  Laterality: N/A;   ESOPHAGOGASTRODUODENOSCOPY N/A 01/06/2015   SLF: mild esophagitis   I & D EXTREMITY Left 12/24/2014   Procedure: IRRIGATION AND DEBRIDEMENT EXTREMITY AND ARTHROSCOPY KNEE;  Surgeon: Laura Apley, MD;  Location: [facility];  Service: Orthopedics;  Laterality: Left;   KNEE SURGERY     SAVORY DILATION  01/06/2015   Procedure: SAVORY DILATION;  Surgeon: Laura Bali, MD;  Location: [facility];  Service: Endoscopy;;   Social History:  reports that she has never smoked. She has never used smokeless tobacco. She reports that she does not drink alcohol and does not use drugs.  Allergies  Allergen Reactions   Oxycodone Hives    Family History  Family history unknown: Yes    Prior to Admission medications   Medication Sig Start Date End Date Taking? Authorizing Provider  acetaminophen (TYLENOL) 500 MG tablet Take 500 mg by mouth every 6 (six) hours as needed for mild pain (pain score 1-3).   Yes [provider]  aspirin EC 81 MG EC tablet Take 1 tablet (81 mg total) by mouth daily. 02/03/19  Yes Johnson, Clanford L, MD  diltiazem (CARDIZEM CD) 240 MG 24 hr capsule Take 240 mg by mouth daily. 07/06/21  Yes [provider]  donepezil (ARICEPT) 10 MG tablet Take 10 mg by mouth at bedtime.  05/07/18  Yes [provider]  pantoprazole (PROTONIX) 40 MG tablet TAKE 1 TABLET(40 MG) BY MOUTH DAILY BEFORE BREAKFAST 02/15/21  Yes Laura Mink, NP  vitamin B-12 (CYANOCOBALAMIN) 250 MCG tablet Take 250 mcg by mouth daily.   Yes [provider]    Physical Exam: Vitals:   07/16/23 0115 07/16/23 0130 07/16/23 0300 07/16/23 0335  BP:  (!) 107/53 (!) 118/53 (!) 99/55  Pulse: 81 78 74 85  Resp: 17 20 17 16   Temp:    97.8 F (36.6 C)  TempSrc:      SpO2: 100%  100% 100% 96%  Weight:      Height:       1.  General: Patient lying supine in bed, cachectic, no acute distress   2. Psychiatric: Somnolent and nonverbal at the time of my exam   3. Neurologic: Found and nonverbal at the time of my exam   4. HEENMT:  Head is atraumatic, normocephalic, pupils reactive to light, neck is supple, trachea is midline, mucous membranes are moist   5. Respiratory : Lungs are clear to auscultation bilaterally without wheezing, rhonchi, rales, no cyanosis, no increase in work of breathing or accessory muscle use   6. Cardiovascular : Heart rate normal, rhythm is regular, no murmurs, rubs or gallops, no peripheral edema, peripheral pulses palpated   7. Gastrointestinal:  Abdomen is soft, nondistended, nontender to palpation bowel sounds active, no masses or organomegaly palpated   8. Skin:  Skin is warm, dry and intact without rashes, acute lesions, or ulcers on limited exam   9.Musculoskeletal:  No acute deformities or trauma, no asymmetry in tone, no peripheral edema, peripheral pulses palpated, no tenderness to palpation in the extremities  Data Reviewed: In the ED Patient is afebrile, heart rate 96/107, respiratory rate 16-18, blood pressure 105/66/129/89, satting 96/99% No leukocytosis, hemoglobin stable Slightly hyponatremic at 140 but likely related to hypovolemia AKI UA is indicative of UTI Negative C. difficile Heme-negative stool Admission requested for generalized weakness/UTI/AKI Assessment and Plan: * AKI (acute kidney injury) (HCC) - Creatinine 1.98 - This is up from 1.3 - Likely related to hypovolemia given volume losses from diarrhea - 1 Davenport bolus given in the ED - Continue IV hydration - Trend in the a.m. - Hold nephrotoxic agents when possible  Diarrhea - C. difficile negative  UTI (urinary tract infection) - UA indicative of UTI - Urine culture pending - Patient started on Cipro in the ED - Continue with Rocephin -  Continue to monitor  Generalized weakness - Likely related to AKI and UTI - Continue with hydration - Continue to monitor  Dementia (HCC) - Continue Aricept  GERD (gastroesophageal reflux disease) - Holding Protonix in the setting of AKI  Essential hypertension - Continue with diltiazem      Advance Care Planning:   Code Status: Limited: Do not attempt resuscitation (DNR) -DNR-LIMITED -Do Not Intubate/DNI    Consults: None at this time  Family Communication: No family at bedside  Severity of Illness: The appropriate patient status for this patient is OBSERVATION. Observation status is judged to be reasonable and necessary in order to provide the required intensity of service to ensure the patient's safety. The patient's presenting symptoms, physical exam findings, and initial radiographic and laboratory data in the context of their medical condition is felt to place them at decreased risk for further clinical deterioration. Furthermore, it is anticipated that  the patient will be medically stable for discharge from the hospital within 2 midnights of admission.   Author: Lilyan Gilford, DO 07/16/2023 4:04 AM  For on call review [email or web address].

## 2023-07-16 NOTE — Assessment & Plan Note (Signed)
-   Likely related to AKI and UTI - Continue with hydration - Continue to monitor

## 2023-07-16 NOTE — Assessment & Plan Note (Signed)
-   UA indicative of UTI - Urine culture pending - Patient started on Cipro in the ED - Continue with Rocephin - Continue to monitor

## 2023-07-16 NOTE — Plan of Care (Signed)

## 2023-07-16 NOTE — Progress Notes (Signed)
Laura Davenport is a 89 y.o. female with medical history significant of hyperlipidemia, paroxysmal supraventricular tachycardia, dementia, hypertension, CKD, presents to the ED with a chief complaint of decreased appetite, weakness, diarrhea.  Patient has been admitted for AKI suspected to be prerenal in the setting of dehydration with diarrhea along with UTI.  She remains on Rocephin as well as IV fluid and creatinine levels are improving.  Patient seen and evaluated at bedside this morning and appears pleasantly confused.  No acute overnight events noted since admission.  A.m. labs ordered for further monitoring.  PT evaluation ordered as well.  Total care time: 30 minutes.

## 2023-07-16 NOTE — Assessment & Plan Note (Signed)
-   C. difficile negative

## 2023-07-16 NOTE — Assessment & Plan Note (Signed)
-   Creatinine 1.98 - This is up from 1.3 - Likely related to hypovolemia given volume losses from diarrhea - 1 L bolus given in the ED - Continue IV hydration - Trend in the a.m. - Hold nephrotoxic agents when possible

## 2023-07-16 NOTE — Progress Notes (Signed)
Patient arrived to room 331 via stretcher from the ED.  Patient was slid from stretcher to bed w/out difficulty.  Patient is pleasantly confused.  Patient is oriented to name only.  Unable to complete all of admission d/t patient being unable to appropriately answer questions.

## 2023-07-16 NOTE — Assessment & Plan Note (Signed)
Continue with diltiazem. 

## 2023-07-16 NOTE — Progress Notes (Signed)
PT Cancellation Note  Patient Details Name: Laura Davenport MRN: [id number] DOB: [birth year]   Cancelled Treatment:    Reason Eval/Treat Not Completed: Other (comment) (Pt pleasantly confused and unwilling to perform functional mobility at this time.)  Attempted functional evaluation. Pt oriented to name. With multiple avenues attempted for pt to perform OOB mobility, pt continued to decline standing up and "going for a walk."   PT will re-attempt evaluation at later time when pt is more willing for functional assessment and as schedule permits.  Nelida Meuse PT, DPT Physical Therapist with [facility] 336 [phone] office  Nelida Meuse 07/16/2023, 11:01 AM

## 2023-07-16 NOTE — Assessment & Plan Note (Addendum)
-   Holding Protonix in the setting of AKI 

## 2023-07-16 NOTE — ED Notes (Addendum)
ED TO INPATIENT HANDOFF REPORT  ED Nurse Name and Phone #: Efraim Kaufmann, RN  S Name/Age/Gender Laura Davenport 89 y.o. female Room/Bed: [facility]14/AP[facility]14  Code Status   Code Status: Full Code  Home/SNF/Other Home Patient oriented to: self Is this baseline? Yes   Triage Complete: Triage complete  Chief Complaint AKI (acute kidney injury) (HCC) [N17.9]  Triage Note Pt BIB Daughter who reports pt has had a decreased appetite since yesterday with weakness, daughter reports concern for dark stool that could have been blood x 1 episode and concerned for UTI    Allergies Allergies  Allergen Reactions   Oxycodone Hives    Level of Care/Admitting Diagnosis ED Disposition     ED Disposition  Admit   Condition  --   Comment  Hospital Area: [facility] [[id number]]  Level of Care: Telemetry [5]  Covid Evaluation: Asymptomatic - no recent exposure (last 10 days) testing not required  Diagnosis: AKI (acute kidney injury) [facility]) [[id number]]  Admitting Physician: Lilyan Gilford [[id number]]  Attending Physician: Lilyan Gilford [[id number]]          B Medical/Surgery History Past Medical History:  Diagnosis Date   Anemia in chronic kidney disease    B12 deficiency anemia    Chronic renal disease, stage 3, moderately decreased glomerular filtration rate (GFR) between 30-59 mL/min/1.73 square meter (HCC)    Dementia (HCC)    Depression    Essential hypertension    GI bleed    Severe gastritis and duodenal ulcers by EGD May 2016    Hyperlipidemia    PSVT (paroxysmal supraventricular tachycardia) (HCC)    Septic arthritis (HCC)    MRSA, left knee    Type 2 diabetes mellitus (HCC)    Past Surgical History:  Procedure Laterality Date   CHOLECYSTECTOMY N/A 11/04/2012   Procedure: LAPAROSCOPIC CHOLECYSTECTOMY;  Surgeon: Dalia Heading, MD;  Location: [facility];  Service: General;  Laterality: N/A;   ESOPHAGOGASTRODUODENOSCOPY N/A 01/06/2015   SLF: mild esophagitis   I & D  EXTREMITY Left 12/24/2014   Procedure: IRRIGATION AND DEBRIDEMENT EXTREMITY AND ARTHROSCOPY KNEE;  Surgeon: Sheral Apley, MD;  Location: [facility] OR;  Service: Orthopedics;  Laterality: Left;   KNEE SURGERY     SAVORY DILATION  01/06/2015   Procedure: SAVORY DILATION;  Surgeon: West Bali, MD;  Location: [facility];  Service: Endoscopy;;     A IV Location/Drains/Wounds Patient Lines/Drains/Airways Status     Active Line/Drains/Airways     Name Placement date Placement time Site Days   Peripheral IV 07/15/23 20 G Anterior;Left Forearm 07/15/23  2106  Forearm  1   Pressure Injury 07/04/21 Hip Left;Proximal;Lateral Stage 2 -  Partial thickness loss of dermis presenting as a shallow open injury with a red, pink wound bed without slough. 2cm x 1cm wound with pink and yellow center, scant serous drainage noted. This w 07/04/21  1250  -- 742   Wound / Incision (Open or Dehisced) 07/02/21 Non-pressure wound;Other (Comment) Hip Right shear scar with scab with some redness proximal to area 14cm long 6cm in width 07/02/21  2023  Hip  744   Wound / Incision (Open or Dehisced) 07/02/21 Other (Comment) Hip Left Deep Tissu Injury (unstagable) - Circular dark ruddy area with redness proximal to surronding area, skin intact 07/02/21  2026  Hip  744            Intake/Output Last 24 hours  Intake/Output Summary (Last 24 hours) at 07/16/2023 0234 Last  data filed at 07/15/2023 2316 Gross per 24 hour  Intake 1195.27 ml  Output 20 ml  Net 1175.27 ml    Labs/Imaging Results for orders placed or performed during the hospital encounter of 07/15/23 (from the past 48 hour(s))  Basic metabolic panel     Status: Abnormal   Collection Time: 07/15/23  6:02 PM  Result Value Ref Range   Sodium 140 135 - 145 mmol/L   Potassium 3.6 3.5 - 5.1 mmol/L   Chloride 110 98 - 111 mmol/L   CO2 20 (L) 22 - 32 mmol/L   Glucose, Bld 190 (H) 70 - 99 mg/dL    Comment: Glucose reference range applies only to samples  taken after fasting for at least 8 hours.   BUN 28 (H) 8 - 23 mg/dL   Creatinine, Ser 1.61 (H) 0.44 - 1.00 mg/dL   Calcium 8.7 (L) 8.9 - 10.3 mg/dL   GFR, Estimated 24 (L) >60 mL/min    Comment: (NOTE) Calculated using the CKD-EPI Creatinine Equation (2021)    Anion gap 10 5 - 15    Comment: Performed at [facility], [address] [place], [place] [address]  CBC     Status: None   Collection Time: 07/15/23  6:02 PM  Result Value Ref Range   WBC 5.8 4.0 - 10.5 K/uL   RBC 4.14 3.87 - 5.11 MIL/uL   Hemoglobin 13.0 12.0 - 15.0 g/dL   HCT 54.0 98.1 - 19.1 %   MCV 98.1 80.0 - 100.0 fL   MCH 31.4 26.0 - 34.0 pg   MCHC 32.0 30.0 - 36.0 g/dL   RDW 47.8 29.5 - 62.1 %   Platelets 268 150 - 400 K/uL   nRBC 0.0 0.0 - 0.2 %    Comment: Performed at [facility], [address] [place], [place] [address]  Urinalysis, Routine w reflex microscopic -Urine, Clean Catch     Status: Abnormal   Collection Time: 07/15/23  8:19 PM  Result Value Ref Range   Color, Urine YELLOW YELLOW   APPearance TURBID (A) CLEAR   Specific Gravity, Urine 1.024 1.005 - 1.030   pH 7.0 5.0 - 8.0   Glucose, UA NEGATIVE NEGATIVE mg/dL   Hgb urine dipstick SMALL (A) NEGATIVE   Bilirubin Urine NEGATIVE NEGATIVE   Ketones, ur 5 (A) NEGATIVE mg/dL   Protein, ur 784 (A) NEGATIVE mg/dL   Nitrite NEGATIVE NEGATIVE   Leukocytes,Ua SMALL (A) NEGATIVE   RBC / HPF 0-5 0 - 5 RBC/hpf   WBC, UA >50 0 - 5 WBC/hpf   Bacteria, UA MANY (A) NONE SEEN   Squamous Epithelial / HPF 0-5 0 - 5 /HPF   WBC Clumps PRESENT    Mucus PRESENT     Comment: Performed at [facility], [address] [place], [place] [address]  C Difficile Quick Screen w PCR reflex     Status: None   Collection Time: 07/15/23  8:51 PM   Specimen: STOOL  Result Value Ref Range   C Diff antigen NEGATIVE NEGATIVE   C Diff toxin NEGATIVE NEGATIVE   C Diff interpretation No C. difficile detected.     Comment: Performed at [facility], [address],  [place], [place] [address]   No results found.  Pending Labs Unresulted Labs (From admission, onward)     Start     Ordered   07/16/23 0500  Comprehensive metabolic panel  Tomorrow morning,   R        07/16/23  0053   07/16/23 0500  Magnesium  Tomorrow morning,   R        07/16/23 0053   07/16/23 0500  CBC with Differential/Platelet  Tomorrow morning,   R        07/16/23 0053   07/15/23 2151  Urine Culture  Once,   URGENT       Question:  Indication  Answer:  Dysuria   07/15/23 2151   07/15/23 2048  Gastrointestinal Panel by PCR , Stool  (Gastrointestinal Panel by PCR, Stool                                                                                                                                                     **Does Not include CLOSTRIDIUM DIFFICILE testing. **If CDIFF testing is needed, place order from the "C Difficile Testing" order set.**)  Once,   URGENT        07/15/23 2047            Vitals/Pain Today's Vitals   07/15/23 2330 07/16/23 0000 07/16/23 0115 07/16/23 0130  BP: 117/63 106/66  (!) 107/53  Pulse: 95 98 81 78  Resp: 19 16 17 20   Temp:      TempSrc:      SpO2: 100% 100% 100% 100%  Weight:      Height:      PainSc:        Isolation Precautions Enteric precautions (UV disinfection)  Medications Medications  aspirin EC tablet 81 mg (has no administration in time range)  diltiazem (CARDIZEM CD) 24 hr capsule 240 mg (has no administration in time range)  donepezil (ARICEPT) tablet 10 mg (10 mg Oral Not Given 07/16/23 0058)  0.9 %  sodium chloride infusion ( Intravenous New Bag/Given 07/16/23 0104)  acetaminophen (TYLENOL) tablet 650 mg (has no administration in time range)    Or  acetaminophen (TYLENOL) suppository 650 mg (has no administration in time range)  oxyCODONE (Oxy IR/ROXICODONE) immediate release tablet 5 mg (has no administration in time range)  ondansetron (ZOFRAN) tablet 4 mg (has no administration in time range)    Or  ondansetron  (ZOFRAN) injection 4 mg (has no administration in time range)  cefTRIAXone (ROCEPHIN) 1 g in sodium chloride 0.9 % 100 mL IVPB (has no administration in time range)  heparin injection 5,000 Units (has no administration in time range)  sodium chloride 0.9 % bolus 1,000 mL (0 mLs Intravenous Stopped 07/15/23 2218)  ciprofloxacin (CIPRO) IVPB 400 mg (0 mg Intravenous Stopped 07/15/23 2316)    Mobility walks with device     Focused Assessments Renal Assessment: AKI; Acute cystitis with hematuria   R Recommendations: See Admitting Provider Note  Report given to:   Additional Notes: pt has not been ambulatory in the ED. Pt is unsteady on feet at this time. Was able to  transfer from wheelchair to bed with assistance

## 2023-07-16 NOTE — Assessment & Plan Note (Signed)
Continue Aricept 

## 2023-07-16 NOTE — ED Notes (Signed)
Pt cleaned and changed  

## 2023-07-16 NOTE — Progress Notes (Signed)
Transition of Care Department [facility]) has reviewed patient and no TOC needs have been identified at this time. We will continue to monitor patient advancement through interdisciplinary progression rounds. If new patient transition needs arise, please place a TOC consult.   07/16/23 0813  TOC Brief Assessment  Insurance and Status Reviewed  Patient has primary care physician Yes  Home environment has been reviewed Lives with daughter.  Prior level of function: Needs some assist from daughters.  Prior/Current Home Services No current home services  Social Determinants of Health Reivew SDOH reviewed no interventions necessary  Readmission risk has been reviewed Yes  Transition of care needs no transition of care needs at this time

## 2023-07-17 DIAGNOSIS — N179 Acute kidney failure, unspecified: Secondary | ICD-10-CM | POA: Diagnosis not present

## 2023-07-17 LAB — BASIC METABOLIC PANEL
Anion gap: 9 (ref 5–15)
BUN: 18 mg/dL (ref 8–23)
CO2: 19 mmol/L — ABNORMAL LOW (ref 22–32)
Calcium: 8.1 mg/dL — ABNORMAL LOW (ref 8.9–10.3)
Chloride: 115 mmol/L — ABNORMAL HIGH (ref 98–111)
Creatinine, Ser: 1.36 mg/dL — ABNORMAL HIGH (ref 0.44–1.00)
GFR, Estimated: 37 mL/min — ABNORMAL LOW (ref >60)
Glucose, Bld: 77 mg/dL (ref 70–99)
Potassium: 4.2 mmol/L (ref 3.5–5.1)
Sodium: 143 mmol/L (ref 135–145)

## 2023-07-17 LAB — CBC
HCT: 31.6 % — ABNORMAL LOW (ref 36.0–46.0)
Hemoglobin: 10.5 g/dL — ABNORMAL LOW (ref 12.0–15.0)
MCH: 32.4 pg (ref 26.0–34.0)
MCHC: 33.2 g/dL (ref 30.0–36.0)
MCV: 97.5 fL (ref 80.0–100.0)
Platelets: 199 10*3/uL (ref 150–400)
RBC: 3.24 MIL/uL — ABNORMAL LOW (ref 3.87–5.11)
RDW: 13.5 % (ref 11.5–15.5)
WBC: 4.9 10*3/uL (ref 4.0–10.5)
nRBC: 0 % (ref 0.0–0.2)

## 2023-07-17 LAB — MAGNESIUM: Magnesium: 1.8 mg/dL (ref 1.7–2.4)

## 2023-07-17 NOTE — Plan of Care (Signed)
Rested well during the night. Confused. One bowel movement during the shift.  Problem: Health Behavior/Discharge Planning: Goal: Ability to manage health-related needs will improve Outcome: Progressing   Problem: Clinical Measurements: Goal: Ability to maintain clinical measurements within normal limits will improve Outcome: Progressing Goal: Will remain free from infection Outcome: Progressing Goal: Diagnostic test results will improve Outcome: Progressing Goal: Respiratory complications will improve Outcome: Progressing Goal: Cardiovascular complication will be avoided Outcome: Progressing   Problem: Activity: Goal: Risk for activity intolerance will decrease Outcome: Progressing   Problem: Nutrition: Goal: Adequate nutrition will be maintained Outcome: Progressing   Problem: Coping: Goal: Level of anxiety will decrease Outcome: Progressing   Problem: Elimination: Goal: Will not experience complications related to bowel motility Outcome: Progressing Goal: Will not experience complications related to urinary retention Outcome: Progressing   Problem: Pain Management: Goal: General experience of comfort will improve Outcome: Progressing   Problem: Safety: Goal: Ability to remain free from injury will improve Outcome: Progressing   Problem: Skin Integrity: Goal: Risk for impaired skin integrity will decrease Outcome: Progressing   Problem: Education: Goal: Knowledge of General Education information will improve Description: Including pain rating scale, medication(s)/side effects and non-pharmacologic comfort measures Outcome: Not Progressing   Problem: Education: Goal: Knowledge of General Education information will improve Description: Including pain rating scale, medication(s)/side effects and non-pharmacologic comfort measures Outcome: Not Progressing

## 2023-07-17 NOTE — Discharge Summary (Signed)
Physician Discharge Summary  Laura Davenport HYQ:[id number] DOB: 1931/06/28 DOA: 07/15/2023  PCP: Laura Perches, MD  Admit date: 07/15/2023  Discharge date: 07/17/2023  Admitted From:Home  Disposition:  Home  Recommendations for Outpatient Follow-up:  Follow up with PCP in 1-2 weeks Continue on home medications as prior with no new changes  Home Health: None  Equipment/Devices: None  Discharge Condition:Stable  CODE STATUS: DNR  Diet recommendation: Heart Healthy  Brief/Interim Summary: Laura Davenport is a 89 y.o. female with medical history significant of hyperlipidemia, paroxysmal supraventricular tachycardia, dementia, hypertension, CKD, presents to the ED with a chief complaint of decreased appetite, weakness, diarrhea.  Patient has been admitted for AKI suspected to be prerenal in the setting of dehydration with diarrhea along with UTI.  She was started on IV fluid with improvement in renal function noted and her stool samples indicated the presence of Campylobacter.  It appears that she may have had some level of food poisoning from fast food according to her daughter.  She is now much improved and no longer has diarrhea.  She was also empirically started on IV Rocephin and has completed treatment during this hospitalization.  No other acute events or concerns noted and she is in stable condition for discharge.  Discharge Diagnoses:  Principal Problem:   AKI (acute kidney injury) (HCC) Active Problems:   Essential hypertension   GERD (gastroesophageal reflux disease)   Dementia (HCC)   Generalized weakness   UTI (urinary tract infection)   Diarrhea  Principal discharge diagnosis: Prerenal AKI in the setting of diarrhea related to Campylobacter infection.  Discharge Instructions  Discharge Instructions     Diet - low sodium heart healthy   Complete by: As directed    Increase activity slowly   Complete by: As directed       Allergies as of 07/17/2023        Reactions   Oxycodone Hives        Medication List     TAKE these medications    acetaminophen 500 MG tablet Commonly known as: TYLENOL Take 500 mg by mouth every 6 (six) hours as needed for mild pain (pain score 1-3).   aspirin EC 81 MG tablet Take 1 tablet (81 mg total) by mouth daily.   diltiazem 240 MG 24 hr capsule Commonly known as: CARDIZEM CD Take 240 mg by mouth daily.   donepezil 10 MG tablet Commonly known as: ARICEPT Take 10 mg by mouth at bedtime.   pantoprazole 40 MG tablet Commonly known as: PROTONIX TAKE 1 TABLET(40 MG) BY MOUTH DAILY BEFORE BREAKFAST   vitamin B-12 250 MCG tablet Commonly known as: CYANOCOBALAMIN Take 250 mcg by mouth daily.        Follow-up Information     Laura Perches, MD. Schedule an appointment as soon as possible for a visit in 1 week(s).   Specialty: Internal Medicine Contact information: [address] [place] [address] [phone]                Allergies  Allergen Reactions   Oxycodone Hives    Consultations: None   Procedures/Studies: No results found.   Discharge Exam: Vitals:   07/16/23 2039 07/17/23 0504  BP: (!) 105/59 (!) 125/52  Pulse: 84 71  Resp: 16 16  Temp: 97.8 F (36.6 C)   SpO2:     Vitals:   07/16/23 1322 07/16/23 1720 07/16/23 2039 07/17/23 0504  BP: (!) 112/50 94/64 (!) 105/59 (!) 125/52  Pulse: 83  96 84 71  Resp: 18 20 16 16   Temp: 98 F (36.7 C) 98.2 F (36.8 C) 97.8 F (36.6 C)   TempSrc: Oral Axillary    SpO2: 100% 99%    Weight:      Height:        General: Pt is alert, awake, not in acute distress Cardiovascular: RRR, S1/S2 +, no rubs, no gallops Respiratory: CTA bilaterally, no wheezing, no rhonchi Abdominal: Soft, NT, ND, bowel sounds + Extremities: no edema, no cyanosis    The results of significant diagnostics from this hospitalization (including imaging, microbiology, ancillary and laboratory) are listed below for reference.      Microbiology: Recent Results (from the past 240 hour(s))  C Difficile Quick Screen w PCR reflex     Status: None   Collection Time: 07/15/23  8:51 PM   Specimen: STOOL  Result Value Ref Range Status   C Diff antigen NEGATIVE NEGATIVE Final   C Diff toxin NEGATIVE NEGATIVE Final   C Diff interpretation No C. difficile detected.  Final    Comment: Performed at [facility], [address], [place], [place] [address]  Gastrointestinal Panel by PCR , Stool     Status: Abnormal   Collection Time: 07/15/23  8:51 PM   Specimen: STOOL  Result Value Ref Range Status   Campylobacter species DETECTED (A) NOT DETECTED Final    Comment: RESULT CALLED TO, READ BACK BY AND VERIFIED WITH: EMILY WRIGHT 1424 07/16/23 MU    Plesimonas shigelloides NOT DETECTED NOT DETECTED Final   Salmonella species NOT DETECTED NOT DETECTED Final   Yersinia enterocolitica NOT DETECTED NOT DETECTED Final   Vibrio species NOT DETECTED NOT DETECTED Final   Vibrio cholerae NOT DETECTED NOT DETECTED Final   Enteroaggregative E coli (EAEC) NOT DETECTED NOT DETECTED Final   Enteropathogenic E coli (EPEC) NOT DETECTED NOT DETECTED Final   Enterotoxigenic E coli (ETEC) NOT DETECTED NOT DETECTED Final   Shiga like toxin producing E coli (STEC) NOT DETECTED NOT DETECTED Final   Shigella/Enteroinvasive E coli (EIEC) NOT DETECTED NOT DETECTED Final   Cryptosporidium NOT DETECTED NOT DETECTED Final   Cyclospora cayetanensis NOT DETECTED NOT DETECTED Final   Entamoeba histolytica NOT DETECTED NOT DETECTED Final   Giardia lamblia NOT DETECTED NOT DETECTED Final   Adenovirus F40/41 NOT DETECTED NOT DETECTED Final   Astrovirus NOT DETECTED NOT DETECTED Final   Norovirus GI/GII NOT DETECTED NOT DETECTED Final   Rotavirus A NOT DETECTED NOT DETECTED Final   Sapovirus (I, II, IV, and V) NOT DETECTED NOT DETECTED Final    Comment: Performed at [facility], [address] [place], [place] [address]     Labs: BNP  (last 3 results) No results for input(s): "BNP" in the last 8760 hours. Basic Metabolic Panel: Recent Labs  Lab 07/15/23 1802 07/16/23 0521 07/17/23 0503  NA 140 142 143  K 3.6 3.2* 4.2  CL 110 117* 115*  CO2 20* 21* 19*  GLUCOSE 190* 91 77  BUN 28* 27* 18  CREATININE 1.98* 1.63* 1.36*  CALCIUM 8.7* 8.3* 8.1*  MG  --  2.1 1.8   Liver Function Tests: Recent Labs  Lab 07/16/23 0521  AST 14*  ALT 9  ALKPHOS 77  BILITOT 0.6  PROT 6.7  ALBUMIN 3.2*   No results for input(s): "LIPASE", "AMYLASE" in the last 168 hours. No results for input(s): "AMMONIA" in the last 168 hours. CBC: Recent Labs  Lab 07/15/23 1802 07/16/23 0521 07/17/23 0503  WBC 5.8 4.8 4.9  NEUTROABS  --  2.0  --   HGB 13.0 11.1* 10.5*  HCT 40.6 33.8* 31.6*  MCV 98.1 96.8 97.5  PLT 268 225 199   Cardiac Enzymes: No results for input(s): "CKTOTAL", "CKMB", "CKMBINDEX", "TROPONINI" in the last 168 hours. BNP: Invalid input(s): "POCBNP" CBG: No results for input(s): "GLUCAP" in the last 168 hours. D-Dimer No results for input(s): "DDIMER" in the last 72 hours. Hgb A1c No results for input(s): "HGBA1C" in the last 72 hours. Lipid Profile No results for input(s): "CHOL", "HDL", "LDLCALC", "TRIG", "CHOLHDL", "LDLDIRECT" in the last 72 hours. Thyroid function studies No results for input(s): "TSH", "T4TOTAL", "T3FREE", "THYROIDAB" in the last 72 hours.  Invalid input(s): "FREET3" Anemia work up No results for input(s): "VITAMINB12", "FOLATE", "FERRITIN", "TIBC", "IRON", "RETICCTPCT" in the last 72 hours. Urinalysis    Component Value Date/Time   COLORURINE YELLOW 07/15/2023 2019   APPEARANCEUR TURBID (A) 07/15/2023 2019   LABSPEC 1.024 07/15/2023 2019   PHURINE 7.0 07/15/2023 2019   GLUCOSEU NEGATIVE 07/15/2023 2019   HGBUR SMALL (A) 07/15/2023 2019   BILIRUBINUR NEGATIVE 07/15/2023 2019   KETONESUR 5 (A) 07/15/2023 2019   PROTEINUR 100 (A) 07/15/2023 2019   UROBILINOGEN 0.2 01/17/2015 1000    NITRITE NEGATIVE 07/15/2023 2019   LEUKOCYTESUR SMALL (A) 07/15/2023 2019   Sepsis Labs Recent Labs  Lab 07/15/23 1802 07/16/23 0521 07/17/23 0503  WBC 5.8 4.8 4.9   Microbiology Recent Results (from the past 240 hour(s))  C Difficile Quick Screen w PCR reflex     Status: None   Collection Time: 07/15/23  8:51 PM   Specimen: STOOL  Result Value Ref Range Status   C Diff antigen NEGATIVE NEGATIVE Final   C Diff toxin NEGATIVE NEGATIVE Final   C Diff interpretation No C. difficile detected.  Final    Comment: Performed at [facility], [address], [place], [place] [address]  Gastrointestinal Panel by PCR , Stool     Status: Abnormal   Collection Time: 07/15/23  8:51 PM   Specimen: STOOL  Result Value Ref Range Status   Campylobacter species DETECTED (A) NOT DETECTED Final    Comment: RESULT CALLED TO, READ BACK BY AND VERIFIED WITH: EMILY WRIGHT 1424 07/16/23 MU    Plesimonas shigelloides NOT DETECTED NOT DETECTED Final   Salmonella species NOT DETECTED NOT DETECTED Final   Yersinia enterocolitica NOT DETECTED NOT DETECTED Final   Vibrio species NOT DETECTED NOT DETECTED Final   Vibrio cholerae NOT DETECTED NOT DETECTED Final   Enteroaggregative E coli (EAEC) NOT DETECTED NOT DETECTED Final   Enteropathogenic E coli (EPEC) NOT DETECTED NOT DETECTED Final   Enterotoxigenic E coli (ETEC) NOT DETECTED NOT DETECTED Final   Shiga like toxin producing E coli (STEC) NOT DETECTED NOT DETECTED Final   Shigella/Enteroinvasive E coli (EIEC) NOT DETECTED NOT DETECTED Final   Cryptosporidium NOT DETECTED NOT DETECTED Final   Cyclospora cayetanensis NOT DETECTED NOT DETECTED Final   Entamoeba histolytica NOT DETECTED NOT DETECTED Final   Giardia lamblia NOT DETECTED NOT DETECTED Final   Adenovirus F40/41 NOT DETECTED NOT DETECTED Final   Astrovirus NOT DETECTED NOT DETECTED Final   Norovirus GI/GII NOT DETECTED NOT DETECTED Final   Rotavirus A NOT DETECTED NOT DETECTED Final    Sapovirus (I, II, IV, and V) NOT DETECTED NOT DETECTED Final    Comment: Performed at [facility], [address] [place], [place] [address]     Time coordinating discharge:  35 minutes  SIGNED:   Erick Blinks, DO [facility] 07/17/2023, 9:14 AM  If 7PM-7AM, please contact night-coverage [email or web address]

## 2023-07-19 LAB — URINE CULTURE: Culture: >=100000 — AB

## 2023-07-22 DIAGNOSIS — A045 Campylobacter enteritis: Secondary | ICD-10-CM | POA: Diagnosis not present

## 2023-07-22 DIAGNOSIS — N170 Acute kidney failure with tubular necrosis: Secondary | ICD-10-CM | POA: Diagnosis not present

## 2023-07-22 DIAGNOSIS — N390 Urinary tract infection, site not specified: Secondary | ICD-10-CM | POA: Diagnosis not present

## 2023-08-05 LAB — MISCELLANEOUS TEST

## 2023-08-06 ENCOUNTER — Other Ambulatory Visit: Payer: Self-pay

## 2023-08-06 DIAGNOSIS — N1832 Chronic kidney disease, stage 3b: Secondary | ICD-10-CM

## 2023-08-07 ENCOUNTER — Inpatient Hospital Stay: Payer: 59 | Attending: Physician Assistant

## 2023-08-07 DIAGNOSIS — N1832 Chronic kidney disease, stage 3b: Secondary | ICD-10-CM | POA: Diagnosis not present

## 2023-08-07 DIAGNOSIS — E538 Deficiency of other specified B group vitamins: Secondary | ICD-10-CM | POA: Diagnosis not present

## 2023-08-07 DIAGNOSIS — D631 Anemia in chronic kidney disease: Secondary | ICD-10-CM | POA: Diagnosis not present

## 2023-08-07 DIAGNOSIS — E559 Vitamin D deficiency, unspecified: Secondary | ICD-10-CM | POA: Diagnosis not present

## 2023-08-07 LAB — COMPREHENSIVE METABOLIC PANEL
ALT: 8 U/L (ref 0–44)
AST: 12 U/L — ABNORMAL LOW (ref 15–41)
Albumin: 3 g/dL — ABNORMAL LOW (ref 3.5–5.0)
Alkaline Phosphatase: 73 U/L (ref 38–126)
Anion gap: 8 (ref 5–15)
BUN: 21 mg/dL (ref 8–23)
CO2: 25 mmol/L (ref 22–32)
Calcium: 8.8 mg/dL — ABNORMAL LOW (ref 8.9–10.3)
Chloride: 107 mmol/L (ref 98–111)
Creatinine, Ser: 1.49 mg/dL — ABNORMAL HIGH (ref 0.44–1.00)
GFR, Estimated: 33 mL/min — ABNORMAL LOW (ref >60)
Glucose, Bld: 151 mg/dL — ABNORMAL HIGH (ref 70–99)
Potassium: 3.6 mmol/L (ref 3.5–5.1)
Sodium: 140 mmol/L (ref 135–145)
Total Bilirubin: 1.1 mg/dL (ref <1.2)
Total Protein: 6.9 g/dL (ref 6.5–8.1)

## 2023-08-07 LAB — CBC WITH DIFFERENTIAL/PLATELET
Abs Immature Granulocytes: 0 10*3/uL (ref 0.00–0.07)
Basophils Absolute: 0 10*3/uL (ref 0.0–0.1)
Basophils Relative: 1 %
Eosinophils Absolute: 0.1 10*3/uL (ref 0.0–0.5)
Eosinophils Relative: 2 %
HCT: 35.1 % — ABNORMAL LOW (ref 36.0–46.0)
Hemoglobin: 11.2 g/dL — ABNORMAL LOW (ref 12.0–15.0)
Immature Granulocytes: 0 %
Lymphocytes Relative: 41 %
Lymphs Abs: 2.8 10*3/uL (ref 0.7–4.0)
MCH: 31.4 pg (ref 26.0–34.0)
MCHC: 31.9 g/dL (ref 30.0–36.0)
MCV: 98.3 fL (ref 80.0–100.0)
Monocytes Absolute: 0.6 10*3/uL (ref 0.1–1.0)
Monocytes Relative: 9 %
Neutro Abs: 3.2 10*3/uL (ref 1.7–7.7)
Neutrophils Relative %: 47 %
Platelets: 275 10*3/uL (ref 150–400)
RBC: 3.57 MIL/uL — ABNORMAL LOW (ref 3.87–5.11)
RDW: 13.1 % (ref 11.5–15.5)
WBC: 6.7 10*3/uL (ref 4.0–10.5)
nRBC: 0 % (ref 0.0–0.2)

## 2023-08-07 LAB — FERRITIN: Ferritin: 183 ng/mL (ref 11–307)

## 2023-08-07 LAB — IRON AND TIBC
Iron: 37 ug/dL (ref 28–170)
Saturation Ratios: 20 % (ref 10.4–31.8)
TIBC: 188 ug/dL — ABNORMAL LOW (ref 250–450)
UIBC: 151 ug/dL

## 2023-10-22 ENCOUNTER — Other Ambulatory Visit: Payer: Self-pay

## 2023-10-22 DIAGNOSIS — N1832 Chronic kidney disease, stage 3b: Secondary | ICD-10-CM

## 2023-10-22 DIAGNOSIS — E559 Vitamin D deficiency, unspecified: Secondary | ICD-10-CM

## 2023-10-22 DIAGNOSIS — D631 Anemia in chronic kidney disease: Secondary | ICD-10-CM

## 2023-10-23 ENCOUNTER — Inpatient Hospital Stay: Payer: 59

## 2023-10-24 ENCOUNTER — Other Ambulatory Visit: Payer: Self-pay

## 2023-10-27 ENCOUNTER — Inpatient Hospital Stay: Payer: 59 | Attending: Physician Assistant

## 2023-10-27 DIAGNOSIS — E538 Deficiency of other specified B group vitamins: Secondary | ICD-10-CM | POA: Diagnosis not present

## 2023-10-27 DIAGNOSIS — D631 Anemia in chronic kidney disease: Secondary | ICD-10-CM

## 2023-10-27 DIAGNOSIS — E559 Vitamin D deficiency, unspecified: Secondary | ICD-10-CM

## 2023-10-27 DIAGNOSIS — N1832 Chronic kidney disease, stage 3b: Secondary | ICD-10-CM | POA: Diagnosis not present

## 2023-10-27 LAB — COMPREHENSIVE METABOLIC PANEL
ALT: 10 U/L (ref 0–44)
AST: 19 U/L (ref 15–41)
Albumin: 3.2 g/dL — ABNORMAL LOW (ref 3.5–5.0)
Alkaline Phosphatase: 75 U/L (ref 38–126)
Anion gap: 9 (ref 5–15)
BUN: 19 mg/dL (ref 8–23)
CO2: 24 mmol/L (ref 22–32)
Calcium: 9.1 mg/dL (ref 8.9–10.3)
Chloride: 108 mmol/L (ref 98–111)
Creatinine, Ser: 1.3 mg/dL — ABNORMAL HIGH (ref 0.44–1.00)
GFR, Estimated: 39 mL/min — ABNORMAL LOW (ref >60)
Glucose, Bld: 162 mg/dL — ABNORMAL HIGH (ref 70–99)
Potassium: 3.9 mmol/L (ref 3.5–5.1)
Sodium: 141 mmol/L (ref 135–145)
Total Bilirubin: 0.9 mg/dL (ref 0.0–1.2)
Total Protein: 7.2 g/dL (ref 6.5–8.1)

## 2023-10-27 LAB — CBC WITH DIFFERENTIAL/PLATELET
Abs Immature Granulocytes: 0.01 10*3/uL (ref 0.00–0.07)
Basophils Absolute: 0 10*3/uL (ref 0.0–0.1)
Basophils Relative: 0 %
Eosinophils Absolute: 0.1 10*3/uL (ref 0.0–0.5)
Eosinophils Relative: 2 %
HCT: 37.3 % (ref 36.0–46.0)
Hemoglobin: 11.8 g/dL — ABNORMAL LOW (ref 12.0–15.0)
Immature Granulocytes: 0 %
Lymphocytes Relative: 55 %
Lymphs Abs: 3.5 10*3/uL (ref 0.7–4.0)
MCH: 31.2 pg (ref 26.0–34.0)
MCHC: 31.6 g/dL (ref 30.0–36.0)
MCV: 98.7 fL (ref 80.0–100.0)
Monocytes Absolute: 0.5 10*3/uL (ref 0.1–1.0)
Monocytes Relative: 7 %
Neutro Abs: 2.3 10*3/uL (ref 1.7–7.7)
Neutrophils Relative %: 36 %
Platelets: 306 10*3/uL (ref 150–400)
RBC: 3.78 MIL/uL — ABNORMAL LOW (ref 3.87–5.11)
RDW: 14.6 % (ref 11.5–15.5)
WBC: 6.4 10*3/uL (ref 4.0–10.5)
nRBC: 0 % (ref 0.0–0.2)

## 2023-10-27 LAB — VITAMIN D 25 HYDROXY (VIT D DEFICIENCY, FRACTURES): Vit D, 25-Hydroxy: 25.37 ng/mL — ABNORMAL LOW (ref 30–100)

## 2023-10-27 LAB — IRON AND TIBC
Iron: 79 ug/dL (ref 28–170)
Saturation Ratios: 33 % — ABNORMAL HIGH (ref 10.4–31.8)
TIBC: 238 ug/dL — ABNORMAL LOW (ref 250–450)
UIBC: 159 ug/dL

## 2023-10-27 LAB — FERRITIN: Ferritin: 150 ng/mL (ref 11–307)

## 2023-10-30 ENCOUNTER — Inpatient Hospital Stay (HOSPITAL_BASED_OUTPATIENT_CLINIC_OR_DEPARTMENT_OTHER): Payer: 59 | Admitting: Oncology

## 2023-10-30 DIAGNOSIS — E538 Deficiency of other specified B group vitamins: Secondary | ICD-10-CM

## 2023-10-30 DIAGNOSIS — E559 Vitamin D deficiency, unspecified: Secondary | ICD-10-CM

## 2023-10-30 DIAGNOSIS — D631 Anemia in chronic kidney disease: Secondary | ICD-10-CM | POA: Diagnosis not present

## 2023-10-30 DIAGNOSIS — N1832 Chronic kidney disease, stage 3b: Secondary | ICD-10-CM

## 2023-10-30 NOTE — Progress Notes (Signed)
 [facility] [address], [place] [address]   CLINIC:  Medical Oncology/Hematology  PCP:  Carylon Perches, MD [address] [place] [address] [phone]   REASON FOR VISIT:  Follow-up for anemia of CKD and relative iron deficiency   PRIOR THERAPY: Aranesp, as needed IV iron   CURRENT THERAPY: Observation  INTERVAL HISTORY:   Laura Davenport 89 y.o. female returns for routine follow-up of her normocytic anemia.  She was last seen in clinic on 05/01/2023.  She is accompanied by her daughter who gives most of her health history due to patient's underlying dementia.  Since her last visit, she was hospitalized from 07/15/2023 through 07/17/2023 for AKI, diarrhea  and dehydration.  She was also found to have a UTI.  Stool samples showed presence of Campylobacter.  She was started empirically on IV Rocephin and completed treatment during hospitalization.    Reports her appetite is 75% energy levels are 80%.  Reports she eats 2 main meals breakfast and between 2 and 3 PM.  She does have a snack at bedtime.  Has constipation at times.  Denies any bleeding per rectum, melena or bright red blood. Reports that there are 8 siblings who are constantly checking and caring for their mom.  Denies any palpitations. No fever, chills, night sweats, unintentional weight loss.  Reports occasional discomfort in her left arm and left knee.   ASSESSMENT & PLAN:  1.  Normocytic anemia - Anemia felt to be secondary to CKD stage IIIb and relative iron deficiency - Patient had an EGD on 01/06/2015 due to hematemesis, which revealed severe erosive gastritis, moderate duodenitis, and multiple duodenal ulcers - She denies any recent hematemesis, bright red blood per rectum, or melena    - SPEP negative for M spike - She previously received Aranesp 50 mcg every 12 weeks as needed for Hgb < 11.0.  (Aranesp last given on 04/19/2020) - Most recent labs from 10/27/2023 show hemoglobin of  11.8 (11.2) with otherwise unremarkable differential.  Iron saturations 33%, TIBC 238 and ferritin of 150.  AST and ALT are WNL. - Anemia likely secondary to CKD and iron deficiency. -No indication to reinitiate Aranesp or iron infusion at this time.   2.  Vitamin B12 deficiency: - Most recent labs (04/29/2022) showed B12 level normal at 679.  Methylmalonic acid is normal. -Will recheck B12 level at next visit.   3.  Chronic kidney disease stage IIIb: - Creatinine has been stable at baseline.  -Will continue to monitor kidney levels.   4.  Vitamin D deficiency: - Vitamin D deficiency noted on 03/22/2021, with vitamin D low at 19.49 - Patient was instructed to start taking vitamin D 2000 units daily. - Vitamin D from 10/27/2023 is low at 25.37 (31.58).  - We discussed increasing vitamin D from 1 tablet daily to 2 tablets daily.  We will recheck her labs in approximately 3 months.  PLAN SUMMARY: >> Increase vitamin D to  2 tabs 4000 units daily. >> Return to clinic in 3 months for labs and telephone call and in 6 months for labs and office visit.     REVIEW OF SYSTEMS:   Review of Systems  Constitutional:  Positive for fatigue. Negative for appetite change.  HENT:   Negative for sore throat.   Musculoskeletal:  Positive for arthralgias.  Neurological:  Positive for extremity weakness. Negative for dizziness, headaches and speech difficulty.     PHYSICAL EXAM:  ECOG PERFORMANCE STATUS: 3 -  Symptomatic, >50% confined to bed  There were no vitals filed for this visit.  There were no vitals filed for this visit. Physical Exam Constitutional:      Appearance: Normal appearance.  Cardiovascular:     Rate and Rhythm: Normal rate and regular rhythm.  Pulmonary:     Effort: Pulmonary effort is normal.     Breath sounds: Normal breath sounds.  Abdominal:     General: Bowel sounds are normal.     Palpations: Abdomen is soft.  Musculoskeletal:        General: No swelling. Normal range  of motion.  Neurological:     Mental Status: She is alert and oriented to person, place, and time. Mental status is at baseline.     PAST MEDICAL/SURGICAL HISTORY:  Past Medical History:  Diagnosis Date   Anemia in chronic kidney disease    B12 deficiency anemia    Chronic renal disease, stage 3, moderately decreased glomerular filtration rate (GFR) between 30-59 mL/min/1.73 square meter (HCC)    Dementia (HCC)    Depression    Essential hypertension    GI bleed    Severe gastritis and duodenal ulcers by EGD May 2016    Hyperlipidemia    PSVT (paroxysmal supraventricular tachycardia) (HCC)    Septic arthritis (HCC)    MRSA, left knee    Type 2 diabetes mellitus (HCC)    Past Surgical History:  Procedure Laterality Date   CHOLECYSTECTOMY N/A 11/04/2012   Procedure: LAPAROSCOPIC CHOLECYSTECTOMY;  Surgeon: Dalia Heading, MD;  Location: [facility] ORS;  Service: General;  Laterality: N/A;   ESOPHAGOGASTRODUODENOSCOPY N/A 01/06/2015   SLF: mild esophagitis   I & D EXTREMITY Left 12/24/2014   Procedure: IRRIGATION AND DEBRIDEMENT EXTREMITY AND ARTHROSCOPY KNEE;  Surgeon: Sheral Apley, MD;  Location: [facility] OR;  Service: Orthopedics;  Laterality: Left;   KNEE SURGERY     SAVORY DILATION  01/06/2015   Procedure: SAVORY DILATION;  Surgeon: West Bali, MD;  Location: [facility] SUITE;  Service: Endoscopy;;    SOCIAL HISTORY:  Social History   Socioeconomic History   Marital status: Widowed    Spouse name: Not on file   Number of children: 9   Years of education: Not on file   Highest education level: Not on file  Occupational History   Occupation: retired; Nature conservation officer  Tobacco Use   Smoking status: Never   Smokeless tobacco: Never   Tobacco comments:    Never smoked  Vaping Use   Vaping status: Never Used  Substance and Sexual Activity   Alcohol use: No    Alcohol/week: 0.0 standard drinks of alcohol   Drug use: No   Sexual activity: Not Currently  Other Topics Concern   Not on  file  Social History Narrative   Daughter Darl Pikes lives in her residence. Daughter Kynlea Blackston is primary caregiver and decision maker. Aariah Godette lives in same neighborhood. There are 9 total children and grandchildren and they help out in any way possible.   Social Drivers of Corporate investment banker Strain: Low Risk  (04/24/2022)   Overall Financial Resource Strain (CARDIA)    Difficulty of Paying Living Expenses: Not hard at all  Food Insecurity: Patient Unable To Answer (07/16/2023)   Hunger Vital Sign    Worried About Programme researcher, broadcasting/film/video in the Last Year: Patient unable to answer    Ran Out of Food in the Last Year: Patient unable to answer  Transportation Needs: Patient  Unable To Answer (07/16/2023)   PRAPARE - Transportation    Lack of Transportation (Medical): Patient unable to answer    Lack of Transportation (Non-Medical): Patient unable to answer  Physical Activity: Inactive (07/11/2021)   Exercise Vital Sign    Days of Exercise per Week: 0 days    Minutes of Exercise per Session: 0 min  Stress: No Stress Concern Present (07/11/2021)   [employer] of Occupational Health - Occupational Stress Questionnaire    Feeling of Stress : Not at all  Social Connections: Socially Isolated (07/11/2021)   Social Connection and Isolation Panel [NHANES]    Frequency of Communication with Friends and Family: More than three times a week    Frequency of Social Gatherings with Friends and Family: More than three times a week    Attends Religious Services: Never    Database administrator or Organizations: No    Attends Banker Meetings: Never    Marital Status: Widowed  Intimate Partner Violence: Patient Unable To Answer (07/16/2023)   Humiliation, Afraid, Rape, and Kick questionnaire    Fear of Current or Ex-Partner: Patient unable to answer    Emotionally Abused: Patient unable to answer    Physically Abused: Patient unable to answer    Sexually Abused: Patient unable to  answer    FAMILY HISTORY:  Family History  Family history unknown: Yes    CURRENT MEDICATIONS:  Outpatient Encounter Medications as of 10/30/2023  Medication Sig   acetaminophen (TYLENOL) 500 MG tablet Take 500 mg by mouth every 6 (six) hours as needed for mild pain (pain score 1-3).   aspirin EC 81 MG EC tablet Take 1 tablet (81 mg total) by mouth daily.   diltiazem (CARDIZEM CD) 240 MG 24 hr capsule Take 240 mg by mouth daily.   donepezil (ARICEPT) 10 MG tablet Take 10 mg by mouth at bedtime.    pantoprazole (PROTONIX) 40 MG tablet TAKE 1 TABLET(40 MG) BY MOUTH DAILY BEFORE BREAKFAST   vitamin B-12 (CYANOCOBALAMIN) 250 MCG tablet Take 250 mcg by mouth daily.   No facility-administered encounter medications on file as of 10/30/2023.    ALLERGIES:  Allergies  Allergen Reactions   Oxycodone Hives    LABORATORY DATA:  I have reviewed the labs as listed.  CBC    Component Value Date/Time   WBC 6.4 10/27/2023 0918   RBC 3.78 (L) 10/27/2023 0918   HGB 11.8 (L) 10/27/2023 0918   HCT 37.3 10/27/2023 0918   PLT 306 10/27/2023 0918   MCV 98.7 10/27/2023 0918   MCH 31.2 10/27/2023 0918   MCHC 31.6 10/27/2023 0918   RDW 14.6 10/27/2023 0918   LYMPHSABS 3.5 10/27/2023 0918   MONOABS 0.5 10/27/2023 0918   EOSABS 0.1 10/27/2023 0918   BASOSABS 0.0 10/27/2023 0918      Latest Ref Rng & Units 10/27/2023    9:18 AM 08/07/2023   10:16 AM 07/17/2023    5:03 AM  CMP  Glucose 70 - 99 mg/dL 132  440  77   BUN 8 - 23 mg/dL 19  21  18    Creatinine 0.44 - 1.00 mg/dL 1.02  7.25  3.66   Sodium 135 - 145 mmol/L 141  140  143   Potassium 3.5 - 5.1 mmol/L 3.9  3.6  4.2   Chloride 98 - 111 mmol/L 108  107  115   CO2 22 - 32 mmol/L 24  25  19    Calcium 8.9 - 10.3 mg/dL 9.1  8.8  8.1   Total Protein 6.5 - 8.1 g/dL 7.2  6.9    Total Bilirubin 0.0 - 1.2 mg/dL 0.9  1.1    Alkaline Phos 38 - 126 U/L 75  73    AST 15 - 41 U/L 19  12    ALT 0 - 44 U/L 10  8      DIAGNOSTIC IMAGING:  I have  independently reviewed the relevant imaging and discussed with the patient.   WRAP UP:  All questions were answered. The patient knows to call the clinic with any problems, questions or concerns.  Medical decision making: Moderate  Time spent on visit:I spent 20 minutes dedicated to the care of this patient (face-to-face and non-face-to-face) on the date of the encounter to include what is described in the assessment and plan.  Durenda Hurt, NP 10/30/2023 11:10 AM

## 2024-01-27 ENCOUNTER — Inpatient Hospital Stay: Payer: 59 | Attending: Physician Assistant

## 2024-01-27 DIAGNOSIS — E538 Deficiency of other specified B group vitamins: Secondary | ICD-10-CM | POA: Insufficient documentation

## 2024-01-27 DIAGNOSIS — D509 Iron deficiency anemia, unspecified: Secondary | ICD-10-CM | POA: Diagnosis not present

## 2024-01-27 DIAGNOSIS — E559 Vitamin D deficiency, unspecified: Secondary | ICD-10-CM | POA: Insufficient documentation

## 2024-01-27 DIAGNOSIS — D631 Anemia in chronic kidney disease: Secondary | ICD-10-CM | POA: Diagnosis not present

## 2024-01-27 DIAGNOSIS — N1832 Chronic kidney disease, stage 3b: Secondary | ICD-10-CM | POA: Diagnosis not present

## 2024-01-27 LAB — CBC WITH DIFFERENTIAL/PLATELET
Abs Immature Granulocytes: 0.02 10*3/uL (ref 0.00–0.07)
Basophils Absolute: 0 10*3/uL (ref 0.0–0.1)
Basophils Relative: 1 %
Eosinophils Absolute: 0.1 10*3/uL (ref 0.0–0.5)
Eosinophils Relative: 2 %
HCT: 36.4 % (ref 36.0–46.0)
Hemoglobin: 11.6 g/dL — ABNORMAL LOW (ref 12.0–15.0)
Immature Granulocytes: 0 %
Lymphocytes Relative: 45 %
Lymphs Abs: 3 10*3/uL (ref 0.7–4.0)
MCH: 31.3 pg (ref 26.0–34.0)
MCHC: 31.9 g/dL (ref 30.0–36.0)
MCV: 98.1 fL (ref 80.0–100.0)
Monocytes Absolute: 0.5 10*3/uL (ref 0.1–1.0)
Monocytes Relative: 7 %
Neutro Abs: 3 10*3/uL (ref 1.7–7.7)
Neutrophils Relative %: 45 %
Platelets: 244 10*3/uL (ref 150–400)
RBC: 3.71 MIL/uL — ABNORMAL LOW (ref 3.87–5.11)
RDW: 13.8 % (ref 11.5–15.5)
WBC: 6.6 10*3/uL (ref 4.0–10.5)
nRBC: 0 % (ref 0.0–0.2)

## 2024-01-27 LAB — COMPREHENSIVE METABOLIC PANEL WITH GFR
ALT: 10 U/L (ref 0–44)
AST: 17 U/L (ref 15–41)
Albumin: 3.4 g/dL — ABNORMAL LOW (ref 3.5–5.0)
Alkaline Phosphatase: 71 U/L (ref 38–126)
Anion gap: 9 (ref 5–15)
BUN: 24 mg/dL — ABNORMAL HIGH (ref 8–23)
CO2: 22 mmol/L (ref 22–32)
Calcium: 9.3 mg/dL (ref 8.9–10.3)
Chloride: 113 mmol/L — ABNORMAL HIGH (ref 98–111)
Creatinine, Ser: 1.32 mg/dL — ABNORMAL HIGH (ref 0.44–1.00)
GFR, Estimated: 38 mL/min — ABNORMAL LOW (ref >60)
Glucose, Bld: 192 mg/dL — ABNORMAL HIGH (ref 70–99)
Potassium: 3.9 mmol/L (ref 3.5–5.1)
Sodium: 144 mmol/L (ref 135–145)
Total Bilirubin: 1 mg/dL (ref 0.0–1.2)
Total Protein: 7.2 g/dL (ref 6.5–8.1)

## 2024-01-27 LAB — VITAMIN D 25 HYDROXY (VIT D DEFICIENCY, FRACTURES): Vit D, 25-Hydroxy: 57.27 ng/mL (ref 30–100)

## 2024-01-27 LAB — IRON AND TIBC
Iron: 69 ug/dL (ref 28–170)
Saturation Ratios: 27 % (ref 10.4–31.8)
TIBC: 254 ug/dL (ref 250–450)
UIBC: 185 ug/dL

## 2024-01-27 LAB — FERRITIN: Ferritin: 107 ng/mL (ref 11–307)

## 2024-01-27 LAB — VITAMIN B12: Vitamin B-12: 1272 pg/mL — ABNORMAL HIGH (ref 180–914)

## 2024-01-30 ENCOUNTER — Inpatient Hospital Stay: Payer: 59 | Admitting: Oncology

## 2024-01-30 DIAGNOSIS — E538 Deficiency of other specified B group vitamins: Secondary | ICD-10-CM | POA: Diagnosis not present

## 2024-01-30 DIAGNOSIS — N1832 Chronic kidney disease, stage 3b: Secondary | ICD-10-CM | POA: Diagnosis not present

## 2024-01-30 DIAGNOSIS — D631 Anemia in chronic kidney disease: Secondary | ICD-10-CM | POA: Diagnosis not present

## 2024-01-30 DIAGNOSIS — E559 Vitamin D deficiency, unspecified: Secondary | ICD-10-CM

## 2024-01-30 NOTE — Progress Notes (Signed)
 Virtual Visit via Telephone Note  I connected with Laura Davenport on 01/30/24 at  2:00 PM EDT by telephone and verified that I am speaking with the correct person using two identifiers.  Location: Patient: Home Provider: Clinic    I discussed the limitations, risks, security and privacy concerns of performing an evaluation and management service by telephone and the availability of in person appointments. I also discussed with the patient that there may be a patient responsible charge related to this service. The patient expressed understanding and agreed to proceed.   History of Present Illness: Patient is a 89 year old female for anemia secondary to CKD and iron deficiency.  She was last evaluated by me on 10/30/2023.  She has severe underlying dementia and her daughter Leanny Moeckel is her primary caregiver and healthcare power of attorney.  She reports overall her mother is doing well.  States she mainly sits in a wheelchair but she gets up several times throughout the day.  Reports she eats most foods that are put on her plate.  She is got good energy.  She is sleeping well.  She denies any pain.  She has not noticed any blood in her stools or urine.  She is taking vitamin D , iron and B12 supplements every day.  States it is hard for her mother to swallow the vitamin D  tablet.  She denies any hospitalizations, surgeries or changes to her baseline health.  Weight is stable.    Observations/Objective: Review of Systems  Unable to perform ROS: Dementia  Constitutional:  Negative for malaise/fatigue and weight loss.   Physical Exam Constitutional:      Comments: Spoke to daughter.  Neurological:     Mental Status: She is alert. She is disoriented.     Assessment and Plan: 1.  Normocytic anemia - Anemia felt to be secondary to CKD stage IIIb and relative iron deficiency - Patient had an EGD on 01/06/2015 due to hematemesis, which revealed severe erosive gastritis, moderate  duodenitis, and multiple duodenal ulcers. - She denies any recent hematemesis, bright red blood per rectum, or melena    - SPEP negative for M spike. - She previously received Aranesp  50 mcg every 12 weeks as needed for Hgb < 11.0.  (Aranesp  last given on 04/19/2020) - Most recent labs from 01/27/2024 show hemoglobin of 11.6 (11.8) with otherwise unremarkable differential.  Iron saturations 27%, TIBC 254 and ferritin of 107.  AST and ALT are WNL. -No indication to reinitiate Aranesp  or iron infusion at this time. -She is taking oral iron 1 tablet each morning with orange juice and appears to be tolerating well.  Continue for now.   2.  Vitamin B12 deficiency: - Most recent labs (01/27/24) showed B12 level normal at 1272.  Methylmalonic acid is normal. -Decrease B12 to 3-4 times per week and elevated level.   3.  Chronic kidney disease stage IIIb: - Creatinine has been stable at baseline.  -Will continue to monitor kidney levels.   4.  Vitamin D  deficiency: - Vitamin D  deficiency noted on 03/22/2021, with vitamin D  low at 19.49 - Patient was instructed to start taking vitamin D  2000 units daily. - Vitamin D  from 01/27/24 is 57.27 (25.37).  - Given significant improvement of her vitamin D  level, recommend every other day dosing.  She is also having trouble swallowing the pill so this will help.  Will recheck labs in approximately 4 to 5 months.   Follow Up Instructions: RTC in 4 months with  labs and office visit.   I discussed the assessment and treatment plan with the patient. The patient was provided an opportunity to ask questions and all were answered. The patient agreed with the plan and demonstrated an understanding of the instructions.   The patient was advised to call back or seek an in-person evaluation if the symptoms worsen or if the condition fails to improve as anticipated.  I provided 18 minutes of non-face-to-face time during this encounter.   Aurther Blue, NP

## 2024-02-08 ENCOUNTER — Encounter (HOSPITAL_COMMUNITY): Payer: Self-pay

## 2024-02-08 ENCOUNTER — Other Ambulatory Visit: Payer: Self-pay

## 2024-02-08 ENCOUNTER — Emergency Department (HOSPITAL_COMMUNITY)
Admission: EM | Admit: 2024-02-08 | Discharge: 2024-02-08 | Disposition: A | Discharge status: Home / Self Care | Attending: Emergency Medicine | Admitting: Emergency Medicine

## 2024-02-08 ENCOUNTER — Emergency Department (HOSPITAL_COMMUNITY)

## 2024-02-08 DIAGNOSIS — F0390 Unspecified dementia without behavioral disturbance: Secondary | ICD-10-CM | POA: Insufficient documentation

## 2024-02-08 DIAGNOSIS — R6889 Other general symptoms and signs: Secondary | ICD-10-CM | POA: Diagnosis not present

## 2024-02-08 DIAGNOSIS — Z7982 Long term (current) use of aspirin: Secondary | ICD-10-CM | POA: Insufficient documentation

## 2024-02-08 DIAGNOSIS — W1809XA Striking against other object with subsequent fall, initial encounter: Secondary | ICD-10-CM | POA: Insufficient documentation

## 2024-02-08 DIAGNOSIS — S0083XA Contusion of other part of head, initial encounter: Secondary | ICD-10-CM | POA: Insufficient documentation

## 2024-02-08 DIAGNOSIS — Z743 Need for continuous supervision: Secondary | ICD-10-CM | POA: Diagnosis not present

## 2024-02-08 DIAGNOSIS — M25552 Pain in left hip: Secondary | ICD-10-CM | POA: Diagnosis not present

## 2024-02-08 DIAGNOSIS — R58 Hemorrhage, not elsewhere classified: Secondary | ICD-10-CM | POA: Diagnosis not present

## 2024-02-08 DIAGNOSIS — M47816 Spondylosis without myelopathy or radiculopathy, lumbar region: Secondary | ICD-10-CM | POA: Diagnosis not present

## 2024-02-08 DIAGNOSIS — W19XXXA Unspecified fall, initial encounter: Secondary | ICD-10-CM | POA: Diagnosis not present

## 2024-02-08 MED ORDER — ACETAMINOPHEN 325 MG PO TABS
650.0000 mg | ORAL_TABLET | Freq: Once | ORAL | Status: AC
  Administered 2024-02-08: 650 mg via ORAL
  Filled 2024-02-08: qty 2

## 2024-02-08 NOTE — ED Notes (Signed)
 Patient transported to X-ray

## 2024-02-08 NOTE — Discharge Instructions (Signed)
 It was our pleasure to provide your ER care today - we hope that you feel better.  Fall precautions.   Return to ER if worse, new symptoms, fevers, new/severe pain, trouble breathing, persistent vomiting, change in mental status, or other concern.

## 2024-02-08 NOTE — ED Notes (Signed)
 ED Provider at bedside.

## 2024-02-08 NOTE — ED Provider Notes (Addendum)
 [facility] Provider Note   CSN: [id number] Arrival date & time: 02/08/24  1030     History  Chief Complaint  Patient presents with   Laura Davenport is a 89 y.o. female.  Pt with hx dementia, chair bound at baseline, presents via EMS s/p fall. Hit forehead. Family heard fall and assisted. Small contusion to forehead. No loc. Pts mental status reported as c/w baseline post fall. No anticoagulant use. Pt limited historian - level 5 caveat.  Cbg 253. Pt does not verbalize specific area of pain or other specific complaint.   The history is provided by the patient, the EMS personnel and medical records. The history is limited by the condition of the patient.  Fall Pertinent negatives include no chest pain, no abdominal pain and no headaches.       Home Medications Prior to Admission medications   Medication Sig Start Date End Date Taking? Authorizing Provider  acetaminophen  (TYLENOL ) 500 MG tablet Take 500 mg by mouth every 6 (six) hours as needed for mild pain (pain score 1-3).    [provider]  aspirin  EC 81 MG EC tablet Take 1 tablet (81 mg total) by mouth daily. 02/03/19   Johnson, Clanford L, MD  diltiazem  (CARDIZEM  CD) 240 MG 24 hr capsule Take 240 mg by mouth daily. 07/06/21   [provider]  donepezil  (ARICEPT ) 10 MG tablet Take 10 mg by mouth at bedtime.  05/07/18   [provider]  pantoprazole  (PROTONIX ) 40 MG tablet TAKE 1 TABLET(40 MG) BY MOUTH DAILY BEFORE BREAKFAST 02/15/21   Delman Ferns, NP  vitamin B-12 (CYANOCOBALAMIN ) 250 MCG tablet Take 250 mcg by mouth daily.    [provider]      Allergies    Oxycodone     Review of Systems   Review of Systems  Unable to perform ROS: Dementia  Constitutional:  Negative for fever.  Cardiovascular:  Negative for chest pain.  Gastrointestinal:  Negative for abdominal pain.  Musculoskeletal:  Negative for back pain and neck pain.   Neurological:  Negative for headaches.    Physical Exam Updated Vital Signs BP 133/62   Pulse 79   Temp 97.6 F (36.4 C) (Oral)   Resp 16   Ht 1.626 m (5\' 4" )   Wt 59.4 kg   SpO2 97%   BMI 22.48 kg/m  Physical Exam Vitals and nursing note reviewed.  Constitutional:      Appearance: Normal appearance. She is well-developed.  HENT:     Head:     Comments: Small contusion to forehead.     Nose: Nose normal.     Mouth/Throat:     Mouth: Mucous membranes are moist.  Eyes:     General: No scleral icterus.    Conjunctiva/sclera: Conjunctivae normal.     Pupils: Pupils are equal, round, and reactive to light.  Neck:     Trachea: No tracheal deviation.  Cardiovascular:     Rate and Rhythm: Normal rate and regular rhythm.     Pulses: Normal pulses.     Heart sounds: Normal heart sounds. No murmur heard.    No friction rub. No gallop.  Pulmonary:     Effort: Pulmonary effort is normal. No respiratory distress.     Breath sounds: Normal breath sounds.  Chest:     Chest wall: No tenderness.  Abdominal:     General: Bowel sounds are normal. There is no  distension.     Palpations: Abdomen is soft.     Tenderness: There is no abdominal tenderness.  Genitourinary:    Comments: No cva tenderness.  Musculoskeletal:        General: No swelling.     Cervical back: Normal range of motion and neck supple. No rigidity or tenderness. No muscular tenderness.     Comments: CTLS spine, non tender, aligned, no step off. ?discomfort with rom left hip/inconsistent. No obvious deformity or sts. Otherwise good passive rom bilateral extremities without other pain or focal bony tenderness. Bil extremities are of normal color and warmth, with intact distal pulses.   Skin:    General: Skin is warm and dry.     Findings: No rash.  Neurological:     Mental Status: She is alert.     Comments: Alert, responds yes/no to some questions. Mental status/functional ability reported as being c/w baseline.  Moves bilateral extremities purposefully.   Psychiatric:        Mood and Affect: Mood normal.     ED Results / Procedures / Treatments   Labs (all labs ordered are listed, but only abnormal results are displayed) Labs Reviewed - No data to display  EKG None  Radiology DG HIP UNILAT W OR W/O PELVIS 2-3 VIEWS LEFT Result Date: 02/08/2024 CLINICAL DATA:  Hip pain status post fall. History of Alzheimer's disease. EXAM: DG HIP (WITH OR WITHOUT PELVIS) 2-3V LEFT COMPARISON:  CT pelvis 11/01/2012. FINDINGS: The bones are diffusely demineralized. No evidence of acute fracture or dislocation. The hip joint spaces appear preserved. There are mild degenerative changes within the lower lumbar spine and sacroiliac joints. Scattered vascular calcifications are noted. IMPRESSION: No evidence of acute fracture or dislocation. Diffuse osseous demineralization. If the patient has persistent hip pain or inability to bear weight, follow up imaging may be warranted as acute hip fractures can be radiographically occult in the elderly. Electronically Signed   By: Elmon Hagedorn M.D.   On: 02/08/2024 11:29    Procedures Procedures    Medications Ordered in ED Medications  acetaminophen  (TYLENOL ) tablet 650 mg (has no administration in time range)    ED Course/ Medical Decision Making/ A&P                                 Medical Decision Making Problems Addressed: Accidental fall, initial encounter: acute illness or injury with systemic symptoms that poses a threat to life or bodily functions Contusion of face, initial encounter: acute illness or injury  Amount and/or Complexity of Data Reviewed Independent Historian: EMS    Details: Ems/family, hx External Data Reviewed: notes. Radiology: ordered and independent interpretation performed. Decision-making details documented in ED Course.  Risk OTC drugs.   Reviewed nursing notes and prior charts for additional history.   Imaging ordered.    Xrays reviewed/interpreted by me - no fx.   Acetaminophen  po.  No persistent focal pain or bony tenderness on exam.  Family indicates mental status and functional ability appears c/w baseline.  Pt appears stable for ED d/c.   Return precautions provided.          Final Clinical Impression(s) / ED Diagnoses Final diagnoses:  None    Rx / DC Orders ED Discharge Orders     None          Guadalupe Lee, MD 02/08/24 1155

## 2024-02-08 NOTE — ED Triage Notes (Addendum)
 Patient BIB [facility], from home. Family told EMS they heard Patient fall and believe she hit her head on Bookshelf. Family denise's that patient takes blood thinners or that she lost consciousness. Hx of Alzheimer.  Patient told EMS 7/10 pain. EMS manual BP 90/42, pulse 54, CBG 253. Per EMS Patient is " Bed Assist to chair." Patient noted to have a bump and cut to forehead.

## 2024-02-08 NOTE — ED Notes (Addendum)
 Patient's Daughter's a bedside, answering questions.

## 2024-02-28 ENCOUNTER — Observation Stay (HOSPITAL_COMMUNITY)
Admission: EM | Admit: 2024-02-28 | Discharge: 2024-03-02 | Disposition: A | Discharge status: Home Health | Attending: Family Medicine | Admitting: Family Medicine

## 2024-02-28 ENCOUNTER — Encounter (HOSPITAL_COMMUNITY): Payer: Self-pay

## 2024-02-28 ENCOUNTER — Emergency Department (HOSPITAL_COMMUNITY)

## 2024-02-28 ENCOUNTER — Other Ambulatory Visit: Payer: Self-pay

## 2024-02-28 DIAGNOSIS — I4891 Unspecified atrial fibrillation: Secondary | ICD-10-CM | POA: Diagnosis not present

## 2024-02-28 DIAGNOSIS — S80919A Unspecified superficial injury of unspecified knee, initial encounter: Secondary | ICD-10-CM | POA: Diagnosis not present

## 2024-02-28 DIAGNOSIS — T148XXA Other injury of unspecified body region, initial encounter: Secondary | ICD-10-CM

## 2024-02-28 DIAGNOSIS — M79603 Pain in arm, unspecified: Secondary | ICD-10-CM | POA: Diagnosis not present

## 2024-02-28 DIAGNOSIS — N183 Chronic kidney disease, stage 3 unspecified: Secondary | ICD-10-CM | POA: Diagnosis present

## 2024-02-28 DIAGNOSIS — D631 Anemia in chronic kidney disease: Secondary | ICD-10-CM | POA: Diagnosis not present

## 2024-02-28 DIAGNOSIS — S72402A Unspecified fracture of lower end of left femur, initial encounter for closed fracture: Secondary | ICD-10-CM | POA: Insufficient documentation

## 2024-02-28 DIAGNOSIS — I10 Essential (primary) hypertension: Secondary | ICD-10-CM | POA: Diagnosis present

## 2024-02-28 DIAGNOSIS — M25519 Pain in unspecified shoulder: Secondary | ICD-10-CM | POA: Diagnosis not present

## 2024-02-28 DIAGNOSIS — E1122 Type 2 diabetes mellitus with diabetic chronic kidney disease: Secondary | ICD-10-CM | POA: Diagnosis not present

## 2024-02-28 DIAGNOSIS — S72332A Displaced oblique fracture of shaft of left femur, initial encounter for closed fracture: Secondary | ICD-10-CM | POA: Diagnosis not present

## 2024-02-28 DIAGNOSIS — M25562 Pain in left knee: Secondary | ICD-10-CM | POA: Diagnosis present

## 2024-02-28 DIAGNOSIS — S42209A Unspecified fracture of upper end of unspecified humerus, initial encounter for closed fracture: Secondary | ICD-10-CM | POA: Insufficient documentation

## 2024-02-28 DIAGNOSIS — F0390 Unspecified dementia without behavioral disturbance: Secondary | ICD-10-CM | POA: Diagnosis not present

## 2024-02-28 DIAGNOSIS — K219 Gastro-esophageal reflux disease without esophagitis: Secondary | ICD-10-CM | POA: Insufficient documentation

## 2024-02-28 DIAGNOSIS — S4292XA Fracture of left shoulder girdle, part unspecified, initial encounter for closed fracture: Secondary | ICD-10-CM | POA: Diagnosis not present

## 2024-02-28 DIAGNOSIS — E119 Type 2 diabetes mellitus without complications: Secondary | ICD-10-CM

## 2024-02-28 DIAGNOSIS — R52 Pain, unspecified: Secondary | ICD-10-CM | POA: Diagnosis not present

## 2024-02-28 DIAGNOSIS — M19022 Primary osteoarthritis, left elbow: Secondary | ICD-10-CM | POA: Diagnosis not present

## 2024-02-28 DIAGNOSIS — S42202A Unspecified fracture of upper end of left humerus, initial encounter for closed fracture: Principal | ICD-10-CM | POA: Insufficient documentation

## 2024-02-28 DIAGNOSIS — Z7982 Long term (current) use of aspirin: Secondary | ICD-10-CM | POA: Diagnosis not present

## 2024-02-28 DIAGNOSIS — M25569 Pain in unspecified knee: Secondary | ICD-10-CM | POA: Diagnosis not present

## 2024-02-28 DIAGNOSIS — W19XXXA Unspecified fall, initial encounter: Secondary | ICD-10-CM | POA: Insufficient documentation

## 2024-02-28 DIAGNOSIS — N1832 Chronic kidney disease, stage 3b: Secondary | ICD-10-CM | POA: Insufficient documentation

## 2024-02-28 DIAGNOSIS — S72442A Displaced fracture of lower epiphysis (separation) of left femur, initial encounter for closed fracture: Secondary | ICD-10-CM | POA: Diagnosis not present

## 2024-02-28 DIAGNOSIS — S42302A Unspecified fracture of shaft of humerus, left arm, initial encounter for closed fracture: Secondary | ICD-10-CM | POA: Diagnosis not present

## 2024-02-28 DIAGNOSIS — N189 Chronic kidney disease, unspecified: Secondary | ICD-10-CM | POA: Diagnosis present

## 2024-02-28 LAB — CBC WITH DIFFERENTIAL/PLATELET
Abs Immature Granulocytes: 0.03 10*3/uL (ref 0.00–0.07)
Basophils Absolute: 0 10*3/uL (ref 0.0–0.1)
Basophils Relative: 0 %
Eosinophils Absolute: 0.1 10*3/uL (ref 0.0–0.5)
Eosinophils Relative: 1 %
HCT: 29.6 % — ABNORMAL LOW (ref 36.0–46.0)
Hemoglobin: 9.4 g/dL — ABNORMAL LOW (ref 12.0–15.0)
Immature Granulocytes: 0 %
Lymphocytes Relative: 27 %
Lymphs Abs: 2.4 10*3/uL (ref 0.7–4.0)
MCH: 31.2 pg (ref 26.0–34.0)
MCHC: 31.8 g/dL (ref 30.0–36.0)
MCV: 98.3 fL (ref 80.0–100.0)
Monocytes Absolute: 0.7 10*3/uL (ref 0.1–1.0)
Monocytes Relative: 8 %
Neutro Abs: 5.7 10*3/uL (ref 1.7–7.7)
Neutrophils Relative %: 64 %
Platelets: 393 10*3/uL (ref 150–400)
RBC: 3.01 MIL/uL — ABNORMAL LOW (ref 3.87–5.11)
RDW: 14.4 % (ref 11.5–15.5)
WBC: 9 10*3/uL (ref 4.0–10.5)
nRBC: 0 % (ref 0.0–0.2)

## 2024-02-28 LAB — BASIC METABOLIC PANEL WITH GFR
Anion gap: 12 (ref 5–15)
BUN: 31 mg/dL — ABNORMAL HIGH (ref 8–23)
CO2: 19 mmol/L — ABNORMAL LOW (ref 22–32)
Calcium: 8.6 mg/dL — ABNORMAL LOW (ref 8.9–10.3)
Chloride: 103 mmol/L (ref 98–111)
Creatinine, Ser: 1.5 mg/dL — ABNORMAL HIGH (ref 0.44–1.00)
GFR, Estimated: 33 mL/min — ABNORMAL LOW (ref >60)
Glucose, Bld: 139 mg/dL — ABNORMAL HIGH (ref 70–99)
Potassium: 4.8 mmol/L (ref 3.5–5.1)
Sodium: 134 mmol/L — ABNORMAL LOW (ref 135–145)

## 2024-02-28 MED ORDER — FENTANYL CITRATE PF 50 MCG/ML IJ SOSY
50.0000 ug | PREFILLED_SYRINGE | Freq: Once | INTRAMUSCULAR | Status: AC
  Administered 2024-02-28: 50 ug via INTRAVENOUS
  Filled 2024-02-28: qty 1

## 2024-02-28 MED ORDER — HYDROCODONE-ACETAMINOPHEN 5-325 MG PO TABS: 1.0000 | ORAL_TABLET | Freq: Four times a day (QID) | ORAL | Status: DC | PRN

## 2024-02-28 NOTE — ED Triage Notes (Signed)
 Patient arrives via [facility] from home. Alert and oriented to person. HX of Alzheimer. Lives with her daughter. EMS reports she fell on 6\8\25 seen in ER. Daughter states since fall she has multiple c/o pain to left arm and left knee.

## 2024-02-28 NOTE — ED Notes (Signed)
 ED Provider at bedside.

## 2024-02-28 NOTE — H&P (Signed)
 History and Physical    Patient: Laura Davenport FMW:[id number] DOB: [birth year] DOA: 02/28/2024 DOS: the patient was seen and examined on 02/28/2024 PCP: Sheryle Carwin, MD  Patient coming from: Home  Chief Complaint:  Chief Complaint  Patient presents with   Fall   HPI: Laura Davenport is a 89 y.o. female with medical history significant of dementia, type 2 diabetes, hypertension, stage III chronic kidney disease.  Patient had a fall 3 weeks ago on her left side.  She was seen in the emergency department and had a hip x-ray which was negative.  She was sent home.  However, the patient has not been able to wheel herself around in her wheelchair, which she normally does.  Additionally, she has had decreased appetite and increasing fatigue.  Her family brought her back to the hospital for evaluation for continued left arm and left knee pain.  There have been no additional falls since 6/8.  Patient unable to provide review of systems, however her daughter states that she has not had any fevers, chills, shortness of breath, chest pain, nausea, vomiting, abdominal pain.  Review of Systems: As mentioned in the history of present illness. All other systems reviewed and are negative. Past Medical History:  Diagnosis Date   Anemia in chronic kidney disease    B12 deficiency anemia    Chronic renal disease, stage 3, moderately decreased glomerular filtration rate (GFR) between 30-59 mL/min/1.73 square meter (HCC)    Dementia (HCC)    Depression    Essential hypertension    GI bleed    Severe gastritis and duodenal ulcers by EGD May 2016    Hyperlipidemia    PSVT (paroxysmal supraventricular tachycardia) (HCC)    Septic arthritis (HCC)    MRSA, left knee    Type 2 diabetes mellitus (HCC)    Past Surgical History:  Procedure Laterality Date   CHOLECYSTECTOMY N/A 11/04/2012   Procedure: LAPAROSCOPIC CHOLECYSTECTOMY;  Surgeon: Oneil DELENA Budge, MD;  Location: [facility];  Service: General;  Laterality: N/A;    ESOPHAGOGASTRODUODENOSCOPY N/A 01/06/2015   SLF: mild esophagitis   I & D EXTREMITY Left 12/24/2014   Procedure: IRRIGATION AND DEBRIDEMENT EXTREMITY AND ARTHROSCOPY KNEE;  Surgeon: Evalene JONETTA Chancy, MD;  Location: [facility] OR;  Service: Orthopedics;  Laterality: Left;   KNEE SURGERY     SAVORY DILATION  01/06/2015   Procedure: SAVORY DILATION;  Surgeon: Margo LITTIE Haddock, MD;  Location: [facility] SU[facility];  Service: Endoscopy;;   Social History:  reports that she has never smoked. She has never used smokeless tobacco. She reports that she does not drink alcohol and does not use drugs.  Allergies  Allergen Reactions   Oxycodone  Hives    Family History  Family history unknown: Yes    Prior to Admission medications   Medication Sig Start Date End Date Taking? Authorizing Provider  acetaminophen  (TYLENOL ) 500 MG tablet Take 500 mg by mouth every 6 (six) hours as needed for mild pain (pain score 1-3).    [provider]  aspirin  EC 81 MG EC tablet Take 1 tablet (81 mg total) by mouth daily. 02/03/19   Johnson, Clanford L, MD  diltiazem  (CARDIZEM  CD) 240 MG 24 hr capsule Take 240 mg by mouth daily. 07/06/21   [provider]  donepezil  (ARICEPT ) 10 MG tablet Take 10 mg by mouth at bedtime.  05/07/18   [provider]  pantoprazole  (PROTONIX ) 40 MG tablet TAKE 1 TABLET(40 MG) BY MOUTH DAILY BEFORE BREAKFAST 02/15/21  Shirlean Therisa ORN, NP  vitamin B-12 (CYANOCOBALAMIN ) 250 MCG tablet Take 250 mcg by mouth daily.    [provider]    Physical Exam: Vitals:   02/28/24 1832 02/28/24 1915 02/28/24 1930 02/28/24 2000  BP:  119/67 114/67 119/72  Pulse:      Resp:      Temp:      TempSrc:      SpO2: 98%      General: Elderly female.  Sleepy. No acute cardiopulmonary distress.  HEENT: Normocephalic atraumatic.  Right and left ears normal in appearance.  Pupils equal, round, reactive to light. Extraocular muscles are intact. Sclerae anicteric and noninjected.  Moist mucosal  membranes. No mucosal lesions.  Neck: Neck supple without lymphadenopathy. No carotid bruits. No masses palpated.  Cardiovascular: Regular rate with normal S1-S2 sounds. No murmurs, rubs, gallops auscultated. No JVD.  Respiratory: Good respiratory effort with no wheezes, rales, rhonchi. Lungs clear to auscultation bilaterally.  No accessory muscle use. Abdomen: Soft, nontender, nondistended. Active bowel sounds. No masses or hepatosplenomegaly  Skin: No rashes, lesions, or ulcerations.  Dry, warm to touch. 2+ dorsalis pedis and radial pulses. Musculoskeletal: No calf or leg pain. All major joints not erythematous nontender.  No upper or lower joint deformation.  Good ROM.  No contractures  Psychiatric: Unable to assess Neurologic: Unable to assess  Data Reviewed: X-rays and lab data reviewed  Assessment and Plan: No notes have been filed under this hospital service. Service: Hospitalist  Principal Problem:   Inadequate pain control Active Problems:   Diabetes mellitus without complication (HCC)   Essential hypertension   Atrial fibrillation and flutter (HCC)   Chronic renal disease, stage 3, moderately decreased glomerular filtration rate (GFR) between 30-59 mL/min/1.73 square meter (HCC)   GERD (gastroesophageal reflux disease)   Dementia (HCC)   Closed fracture of left distal femur (HCC)   Proximal humerus fracture    Inadequate pain control Vicodin  Closed proximal humerus fracture and closed fracture of left distal femur Ortho consulted - outpatient management. Sling for humerus fracture Dementia Namenda Chronic renal disease  Atrial fibrillation Rhythm controlled Hypertension Continue antihypertensives Diabetes SSI   Advance Care Planning:   Code Status: Limited: Do not attempt resuscitation (DNR) -DNR-LIMITED -Do Not Intubate/DNI confirmed by patient's caregiver  Consults: None  Family Communication: Patient's daughter  Severity of Illness: The appropriate  patient status for this patient is OBSERVATION. Observation status is judged to be reasonable and necessary in order to provide the required intensity of service to ensure the patient's safety. The patient's presenting symptoms, physical exam findings, and initial radiographic and laboratory data in the context of their medical condition is felt to place them at decreased risk for further clinical deterioration. Furthermore, it is anticipated that the patient will be medically stable for discharge from the hospital within 2 midnights of admission.   Author: Gunnard Dorrance J Evelia Waskey, DO 02/28/2024 10:18 PM  For on call review [email or web address].

## 2024-02-28 NOTE — ED Notes (Signed)
Xray tech with portable at patient bedside.

## 2024-02-28 NOTE — ED Notes (Signed)
 Admitting provider, Dr. Barbra at patient bedside.

## 2024-02-28 NOTE — ED Provider Notes (Signed)
 [place] EMERGENCY DEPARTMENT AT [facility] Provider Note   CSN: [id number] Arrival date & time: 02/28/24  8190     Patient presents with: Laura Davenport is a 89 y.o. female past medical history significant for diabetes, hypertension, and dementia presents today for multiple complaints.  Patient fell on 02/08/2024 and was seen in the ED.  Patient has complained of left arm and left knee pain since.  Patient's family member denies any additional falls.    Fall       Prior to Admission medications   Medication Sig Start Date End Date Taking? Authorizing Provider  acetaminophen  (TYLENOL ) 500 MG tablet Take 500 mg by mouth every 6 (six) hours as needed for mild pain (pain score 1-3).    [provider]  aspirin  EC 81 MG EC tablet Take 1 tablet (81 mg total) by mouth daily. 02/03/19   Johnson, Clanford L, MD  diltiazem  (CARDIZEM  CD) 240 MG 24 hr capsule Take 240 mg by mouth daily. 07/06/21   [provider]  donepezil  (ARICEPT ) 10 MG tablet Take 10 mg by mouth at bedtime.  05/07/18   [provider]  pantoprazole  (PROTONIX ) 40 MG tablet TAKE 1 TABLET(40 MG) BY MOUTH DAILY BEFORE BREAKFAST 02/15/21   Shirlean Therisa ORN, NP  vitamin B-12 (CYANOCOBALAMIN ) 250 MCG tablet Take 250 mcg by mouth daily.    [provider]    Allergies: Oxycodone     Review of Systems  Musculoskeletal:  Positive for arthralgias.    Updated Vital Signs BP 119/72   Pulse (!) 110   Temp 97.6 F (36.4 C) (Oral)   Resp 16   SpO2 98%   Physical Exam Vitals and nursing note reviewed.  Constitutional:      General: She is not in acute distress.    Appearance: Normal appearance. She is well-developed. She is not diaphoretic.  HENT:     Head: Normocephalic and atraumatic.     Right Ear: External ear normal.     Left Ear: External ear normal.   Eyes:     Extraocular Movements: Extraocular movements intact.     Conjunctiva/sclera: Conjunctivae normal.     Cardiovascular:     Rate and Rhythm: Normal rate and regular rhythm.     Pulses: Normal pulses.  Pulmonary:     Effort: Pulmonary effort is normal. No respiratory distress.  Abdominal:     Palpations: Abdomen is soft.     Tenderness: There is no abdominal tenderness.   Musculoskeletal:        General: No swelling.     Cervical back: Neck supple.     Comments: Patient contracting arms and legs and refusing to straighten.  Generalized swelling to the left knee which has been replaced in 2014 when compared to right.  No obvious deformity to the left arm on exam.  Patient is neurovascularly intact.   Skin:    General: Skin is warm and dry.     Capillary Refill: Capillary refill takes less than 2 seconds.   Neurological:     Mental Status: She is alert. Mental status is at baseline.   Psychiatric:        Mood and Affect: Mood normal.     (all labs ordered are listed, but only abnormal results are displayed) Labs Reviewed  BASIC METABOLIC PANEL WITH GFR  CBC WITH DIFFERENTIAL/PLATELET    EKG: None  Radiology: DG Shoulder Left Result Date: 02/28/2024 CLINICAL DATA:  Left upper extremity  pain.  Recent fall. EXAM: LEFT SHOULDER - 2+ VIEW; LEFT HUMERUS - 2+ VIEW; LEFT ELBOW - COMPLETE 3+ VIEW; LEFT FOREARM - 2 VIEW COMPARISON:  None Available. FINDINGS: Shoulder: Displaced proximal humeral fracture with dominant fracture plane through the surgical neck. Displacement of approximately 10 mm. There may be some peripheral callus formation suggesting this is subacute. No glenohumeral dislocation. Acromioclavicular alignment is maintained. Humerus: Displaced proximal humerus fracture is described. Distal humerus is intact. The bones are subjectively under mineralized. Elbow: Slight irregularity of the radial neck may represent an impaction fracture. Moderate underlying elbow osteoarthritis. There is an elbow joint effusion. Forearm: No additional fracture of the forearm. Distal radius is  intact. The ulna is intact. Wrist alignment is maintained. IMPRESSION: 1. Displaced proximal humerus fracture with dominant fracture plane through the surgical neck. There may be some peripheral callus formation suggesting this is subacute. 2. Slight irregularity of the radial neck may represent an impaction fracture. Elbow joint effusion. 3. No additional fracture of the forearm. Electronically Signed   By: Andrea Gasman M.D.   On: 02/28/2024 20:03   DG Humerus Left Result Date: 02/28/2024 CLINICAL DATA:  Left upper extremity pain.  Recent fall. EXAM: LEFT SHOULDER - 2+ VIEW; LEFT HUMERUS - 2+ VIEW; LEFT ELBOW - COMPLETE 3+ VIEW; LEFT FOREARM - 2 VIEW COMPARISON:  None Available. FINDINGS: Shoulder: Displaced proximal humeral fracture with dominant fracture plane through the surgical neck. Displacement of approximately 10 mm. There may be some peripheral callus formation suggesting this is subacute. No glenohumeral dislocation. Acromioclavicular alignment is maintained. Humerus: Displaced proximal humerus fracture is described. Distal humerus is intact. The bones are subjectively under mineralized. Elbow: Slight irregularity of the radial neck may represent an impaction fracture. Moderate underlying elbow osteoarthritis. There is an elbow joint effusion. Forearm: No additional fracture of the forearm. Distal radius is intact. The ulna is intact. Wrist alignment is maintained. IMPRESSION: 1. Displaced proximal humerus fracture with dominant fracture plane through the surgical neck. There may be some peripheral callus formation suggesting this is subacute. 2. Slight irregularity of the radial neck may represent an impaction fracture. Elbow joint effusion. 3. No additional fracture of the forearm. Electronically Signed   By: Andrea Gasman M.D.   On: 02/28/2024 20:03   DG Forearm Left Result Date: 02/28/2024 CLINICAL DATA:  Left upper extremity pain.  Recent fall. EXAM: LEFT SHOULDER - 2+ VIEW; LEFT HUMERUS  - 2+ VIEW; LEFT ELBOW - COMPLETE 3+ VIEW; LEFT FOREARM - 2 VIEW COMPARISON:  None Available. FINDINGS: Shoulder: Displaced proximal humeral fracture with dominant fracture plane through the surgical neck. Displacement of approximately 10 mm. There may be some peripheral callus formation suggesting this is subacute. No glenohumeral dislocation. Acromioclavicular alignment is maintained. Humerus: Displaced proximal humerus fracture is described. Distal humerus is intact. The bones are subjectively under mineralized. Elbow: Slight irregularity of the radial neck may represent an impaction fracture. Moderate underlying elbow osteoarthritis. There is an elbow joint effusion. Forearm: No additional fracture of the forearm. Distal radius is intact. The ulna is intact. Wrist alignment is maintained. IMPRESSION: 1. Displaced proximal humerus fracture with dominant fracture plane through the surgical neck. There may be some peripheral callus formation suggesting this is subacute. 2. Slight irregularity of the radial neck may represent an impaction fracture. Elbow joint effusion. 3. No additional fracture of the forearm. Electronically Signed   By: Andrea Gasman M.D.   On: 02/28/2024 20:03   DG Elbow Complete Left Result Date: 02/28/2024 CLINICAL DATA:  Left upper extremity pain.  Recent fall. EXAM: LEFT SHOULDER - 2+ VIEW; LEFT HUMERUS - 2+ VIEW; LEFT ELBOW - COMPLETE 3+ VIEW; LEFT FOREARM - 2 VIEW COMPARISON:  None Available. FINDINGS: Shoulder: Displaced proximal humeral fracture with dominant fracture plane through the surgical neck. Displacement of approximately 10 mm. There may be some peripheral callus formation suggesting this is subacute. No glenohumeral dislocation. Acromioclavicular alignment is maintained. Humerus: Displaced proximal humerus fracture is described. Distal humerus is intact. The bones are subjectively under mineralized. Elbow: Slight irregularity of the radial neck may represent an impaction  fracture. Moderate underlying elbow osteoarthritis. There is an elbow joint effusion. Forearm: No additional fracture of the forearm. Distal radius is intact. The ulna is intact. Wrist alignment is maintained. IMPRESSION: 1. Displaced proximal humerus fracture with dominant fracture plane through the surgical neck. There may be some peripheral callus formation suggesting this is subacute. 2. Slight irregularity of the radial neck may represent an impaction fracture. Elbow joint effusion. 3. No additional fracture of the forearm. Electronically Signed   By: Andrea Gasman M.D.   On: 02/28/2024 20:03   DG Knee Complete 4 Views Left Result Date: 02/28/2024 CLINICAL DATA:  Pain.  Recent fall. EXAM: LEFT KNEE - COMPLETE 4+ VIEW COMPARISON:  None Available. FINDINGS: The bones are subjectively under mineralized. Technically limited assessment due to positioning and bony under mineralization. There is an oblique displaced distal femoral fracture. Displacement primarily involves the lateral cortex. No obvious intra-articular involvement. Chronic appearing irregularity of the inferior patella. No convincing joint effusion. IMPRESSION: 1. Oblique displaced distal femoral fracture. 2. Chronic appearing irregularity of the inferior patella. Electronically Signed   By: Andrea Gasman M.D.   On: 02/28/2024 19:59     Procedures   Medications Ordered in the ED  fentaNYL  (SUBLIMAZE ) injection 50 mcg (50 mcg Intravenous Given 02/28/24 2157)    Clinical Course as of 02/28/24 2201  Sat Feb 28, 2024  111 89 year old female here with pain left arm left leg after multiple recent falls.  Difficulty ambulating.  Imaging showing proximal humerus fracture and distal femur fracture.  Orthopedic recommending nonoperative treatment at this point.  Will need admission for pain control and likely physical therapy TOC [MB]    Clinical Course User Index [MB] Towana Ozell BROCKS, MD                                 Medical  Decision Making Amount and/or Complexity of Data Reviewed Labs: ordered. Radiology: ordered.  Risk Prescription drug management. Decision regarding hospitalization.   This patient presents to the ED for concern of left arm and left knee pain differential diagnosis includes musculoskeletal pain, fracture, dislocation   Imaging Studies ordered:  I ordered imaging studies including left elbow, forearm, humerus, shoulder, and knee x-rays I independently visualized and interpreted imaging which showed oblique displaced distal femoral fracture, displaced proximal humerus fracture with dominant fracture plane through the surgical neck.  Slightly irregularity of the radial neck may represent an impacted fracture.  Elbow joint effusion I agree with the radiologist interpretation   Medicines ordered and prescription drug management:  I ordered medication including fentanyl     I have reviewed the patients home medicines and have made adjustments as needed   Problem List / ED Course:  Consulted Dr. Sharl, Orthopedics who recommended sling PRN and PT/OT Consulted Hospitalist, Dr. Barbra who is agreeable to admission      Final  diagnoses:  Closed fracture of proximal end of left humerus, unspecified fracture morphology, initial encounter  Closed displaced fracture of distal epiphysis of left femur, initial encounter [facility])    ED Discharge Orders     None          Laura Davenport 02/28/24 2201    Towana Ozell BROCKS, MD 02/29/24 [phone]

## 2024-02-28 NOTE — ED Notes (Signed)
 Patient's daughter, Chereese Cilento updated on patient's status by ED provider  Ileana Eck, PA-C.

## 2024-02-28 NOTE — Progress Notes (Signed)
 Pt presents to unit 300 with AMS, unable to complete admission database. Daughter at bedside in ED, left to go home prior to pt coming to unit. Will reattempt completion of admission database when daughter is present.

## 2024-02-29 ENCOUNTER — Observation Stay (HOSPITAL_COMMUNITY)

## 2024-02-29 DIAGNOSIS — M25552 Pain in left hip: Secondary | ICD-10-CM | POA: Diagnosis not present

## 2024-02-29 DIAGNOSIS — M1612 Unilateral primary osteoarthritis, left hip: Secondary | ICD-10-CM | POA: Diagnosis not present

## 2024-02-29 DIAGNOSIS — S72442A Displaced fracture of lower epiphysis (separation) of left femur, initial encounter for closed fracture: Secondary | ICD-10-CM | POA: Diagnosis not present

## 2024-02-29 DIAGNOSIS — N3289 Other specified disorders of bladder: Secondary | ICD-10-CM | POA: Diagnosis not present

## 2024-02-29 DIAGNOSIS — M85852 Other specified disorders of bone density and structure, left thigh: Secondary | ICD-10-CM | POA: Diagnosis not present

## 2024-02-29 DIAGNOSIS — R52 Pain, unspecified: Secondary | ICD-10-CM | POA: Diagnosis not present

## 2024-02-29 DIAGNOSIS — M1712 Unilateral primary osteoarthritis, left knee: Secondary | ICD-10-CM | POA: Diagnosis not present

## 2024-02-29 DIAGNOSIS — S72352A Displaced comminuted fracture of shaft of left femur, initial encounter for closed fracture: Secondary | ICD-10-CM | POA: Diagnosis not present

## 2024-02-29 LAB — GLUCOSE, CAPILLARY
Glucose-Capillary: 139 mg/dL — ABNORMAL HIGH (ref 70–99)
Glucose-Capillary: 126 mg/dL — ABNORMAL HIGH (ref 70–99)
Glucose-Capillary: 119 mg/dL — ABNORMAL HIGH (ref 70–99)
Glucose-Capillary: 131 mg/dL — ABNORMAL HIGH (ref 70–99)

## 2024-02-29 LAB — HEMOGLOBIN A1C
Hgb A1c MFr Bld: 6.2 % — ABNORMAL HIGH (ref 4.8–5.6)
Mean Plasma Glucose: 131.24 mg/dL

## 2024-02-29 MED ORDER — INSULIN ASPART 100 UNIT/ML IJ SOLN: 0.0000 [IU] | Freq: Every day | INTRAMUSCULAR | Status: DC

## 2024-02-29 MED ORDER — INSULIN ASPART 100 UNIT/ML IJ SOLN
0.0000 [IU] | Freq: Three times a day (TID) | INTRAMUSCULAR | Status: DC
  Administered 2024-02-29 – 2024-03-01 (×3): 2 [IU] via SUBCUTANEOUS

## 2024-02-29 MED ORDER — ACETAMINOPHEN 500 MG PO TABS
500.0000 mg | ORAL_TABLET | Freq: Four times a day (QID) | ORAL | Status: DC
  Administered 2024-02-29 – 2024-03-02 (×6): 500 mg via ORAL
  Filled 2024-02-29 (×6): qty 1

## 2024-02-29 MED ORDER — METHOCARBAMOL 500 MG PO TABS
500.0000 mg | ORAL_TABLET | Freq: Three times a day (TID) | ORAL | Status: DC
  Administered 2024-02-29 – 2024-03-02 (×7): 500 mg via ORAL
  Filled 2024-02-29 (×7): qty 1

## 2024-02-29 MED ORDER — ENOXAPARIN SODIUM 30 MG/0.3ML IJ SOSY
30.0000 mg | PREFILLED_SYRINGE | INTRAMUSCULAR | Status: DC
  Administered 2024-02-29 – 2024-03-02 (×3): 30 mg via SUBCUTANEOUS
  Filled 2024-02-29 (×3): qty 0.3

## 2024-02-29 MED ORDER — BISACODYL 10 MG RE SUPP
10.0000 mg | Freq: Once | RECTAL | Status: AC
  Administered 2024-02-29: 10 mg via RECTAL
  Filled 2024-02-29: qty 1

## 2024-02-29 MED ORDER — ASPIRIN 81 MG PO TBEC
81.0000 mg | DELAYED_RELEASE_TABLET | Freq: Every day | ORAL | Status: DC
  Administered 2024-02-29 – 2024-03-02 (×3): 81 mg via ORAL
  Filled 2024-02-29 (×3): qty 1

## 2024-02-29 MED ORDER — ORAL CARE MOUTH RINSE: 15.0000 mL | OROMUCOSAL | Status: DC | PRN

## 2024-02-29 MED ORDER — PANTOPRAZOLE SODIUM 40 MG PO TBEC
40.0000 mg | DELAYED_RELEASE_TABLET | Freq: Every day | ORAL | Status: DC
  Administered 2024-02-29 – 2024-03-02 (×3): 40 mg via ORAL
  Filled 2024-02-29 (×2): qty 1

## 2024-02-29 MED ORDER — DONEPEZIL HCL 5 MG PO TABS
10.0000 mg | ORAL_TABLET | Freq: Every day | ORAL | Status: DC
  Administered 2024-02-29 – 2024-03-01 (×2): 10 mg via ORAL
  Filled 2024-02-29 (×2): qty 2

## 2024-02-29 MED ORDER — OXYCODONE HCL 5 MG PO TABS
5.0000 mg | ORAL_TABLET | ORAL | Status: DC | PRN
  Administered 2024-03-01 – 2024-03-02 (×3): 5 mg via ORAL
  Filled 2024-02-29 (×3): qty 1

## 2024-02-29 MED ORDER — LACTULOSE 10 GM/15ML PO SOLN
30.0000 g | Freq: Once | ORAL | Status: AC
  Administered 2024-02-29: 30 g via ORAL
  Filled 2024-02-29: qty 60

## 2024-02-29 MED ORDER — ONDANSETRON HCL 4 MG/2ML IJ SOLN
4.0000 mg | Freq: Four times a day (QID) | INTRAMUSCULAR | Status: DC | PRN
Start: 2024-02-29 — End: 2024-03-02

## 2024-02-29 MED ORDER — ONDANSETRON HCL 4 MG PO TABS
4.0000 mg | ORAL_TABLET | Freq: Four times a day (QID) | ORAL | Status: DC | PRN
  Administered 2024-03-01: 4 mg via ORAL
  Filled 2024-02-29: qty 1

## 2024-02-29 MED ORDER — DILTIAZEM HCL ER COATED BEADS 240 MG PO CP24
240.0000 mg | ORAL_CAPSULE | Freq: Every day | ORAL | Status: DC
  Administered 2024-02-29 – 2024-03-02 (×3): 240 mg via ORAL
  Filled 2024-02-29 (×3): qty 1

## 2024-02-29 NOTE — Progress Notes (Signed)
 PROGRESS NOTE   Laura Davenport, is a 89 y.o. female, DOB - [birth year], FMW:[id number]  Admit date - 02/28/2024   Admitting Physician Lang JINNY Peel, DO  Outpatient Primary MD for the patient is Sheryle Carwin, MD  LOS - 0  Chief Complaint  Patient presents with   Fall       Brief Narrative:  89 y.o. female with medical history significant of dementia, DM2, HTN,   and CKD 3B admitted on 02/22/2024 with left hip and knee area pain after falling at home on 02/08/24.  Apparently no additional falls since 02/08/2024 -- Patient requiring pain control at this time and further orthopedic evaluation   -Assessment and Plan: 1)Lt Oblique displaced distal femoral fracture--in addition to concerns about possible left hip/femoral neck fracture -- Orthopedic input requested - CT of the left hip pending - Patient should be non--weightbearing at this time due to left orthopedic displaced distal femoral fracture -- Pain control improving with medication changes -- Added scheduled methocarbamol and Tylenol  - Use as needed oxycodone   2)Closed subacute displaced proximal humerus fracture -- with dominant fracture plane through the surgical neck - Orthopedic surgeon recommends shoulder sling  3)Dementia--- advanced dementia with significant cognitive and memory deficits -Continue Aricept  and supportive care  4)CKD stage - 3B --  creatinine on admission=  1.50 (close to baseline) - renally adjust medications, avoid nephrotoxic agents / dehydration  / hypotension  5)PAFib--- continue Cardizem  for rate control - Patient was Not on full anticoagulation PTA  6)GERD--continue Protonix   7)DM2--A1c 6.2 reflecting excellent diabetic control PTA Use Novolog /Humalog Sliding scale insulin  with Accu-Cheks/Fingersticks as ordered   8) acute on chronic anemia--- patient with chronic anemia of CKD - Hgb now down to 9.4 from baseline around 11, most likely due to fracture related Acute blood loss  Status is:  Inpatient   Disposition: The patient is from: Home              Anticipated d/c is to: Home with [facility]               Anticipated d/c date is: 1 day              Patient currently is not medically stable to d/c. Barriers: Not Clinically Stable-   Code Status :  -  Code Status: Limited: Do not attempt resuscitation (DNR) -DNR-LIMITED -Do Not Intubate/DNI    Family Communication:   Left voicemail for patient's daughter Novella, unable to reach patient's daughter Devere  DVT Prophylaxis  :   - SCDs  enoxaparin  (LOVENOX ) injection 30 mg Start: 02/29/24 1000   Lab Results  Component Value Date   PLT 393 02/28/2024    Inpatient Medications  Scheduled Meds:  aspirin  EC  81 mg Oral Daily   diltiazem   240 mg Oral Daily   donepezil   10 mg Oral QHS   enoxaparin  (LOVENOX ) injection  30 mg Subcutaneous Q24H   insulin  aspart  0-15 Units Subcutaneous TID WC   insulin  aspart  0-5 Units Subcutaneous QHS   methocarbamol  500 mg Oral TID   pantoprazole   40 mg Oral Daily   Continuous Infusions: PRN Meds:.HYDROcodone -acetaminophen , ondansetron  **OR** ondansetron  (ZOFRAN ) IV, mouth rinse   Anti-infectives (From admission, onward)    None      Subjective: Karah Caruthers today has no fevers, no emesis,  No chest pain,   - Left lower extremity pain persist - Had difficulty working with physical therapy due to pain - Oral intake is poor - Family  visited earlier   Objective: Vitals:   02/28/24 2130 02/28/24 2245 02/28/24 2357 02/29/24 0415  BP: 107/81 113/74 (!) 115/57 (!) 103/51  Pulse:  99 98 90  Resp:  16 14 (!) 22  Temp:  (!) 97.4 F (36.3 C) 98.1 F (36.7 C) 98.8 F (37.1 C)  TempSrc:  Axillary Axillary Oral  SpO2:  93% 98% 100%  Weight:   56.2 kg     Intake/Output Summary (Last 24 hours) at 02/29/2024 1423 Last data filed at 02/29/2024 1000 Gross per 24 hour  Intake 240 ml  Output --  Net 240 ml   Filed Weights   02/28/24 2357  Weight: 56.2 kg    Physical Exam  Gen:-  Awake Alert, pleasantly confused HEENT:- [place].AT, No sclera icterus, edentulous Neck-Supple Neck,No JVD,.  Lungs-  CTAB , fair symmetrical air movement CV- S1, S2 normal, regular  Abd-  +ve B.Sounds, Abd Soft, No tenderness,    Extremity/Skin:- No  edema, pedal pulses present  Psych-advanced cognitive and memory deficits due to underlying dementia Neuro-generalized weakness, no new focal deficits, no tremors MSK--left lower extremity shortened and rotated, discomfort/pain with range of motion - Left shoulder sling in situ  Data Reviewed: I have personally reviewed following labs and imaging studies  CBC: Recent Labs  Lab 02/28/24 2135  WBC 9.0  NEUTROABS 5.7  HGB 9.4*  HCT 29.6*  MCV 98.3  PLT 393   Basic Metabolic Panel: Recent Labs  Lab 02/28/24 2135  NA 134*  K 4.8  CL 103  CO2 19*  GLUCOSE 139*  BUN 31*  CREATININE 1.50*  CALCIUM  8.6*   GFR: Estimated Creatinine Clearance: 21.1 mL/min (A) (by C-G formula based on SCr of 1.5 mg/dL (H)).  HbA1C: Recent Labs    02/29/24 0345  HGBA1C 6.2*   Radiology Studies: DG HIP UNILAT W OR W/O PELVIS 2-3 VIEWS LEFT Result Date: 02/29/2024 CLINICAL DATA:  [id number] Hip pain [id number] EXAM: DG HIP (WITH OR WITHOUT PELVIS) 2-3V LEFT COMPARISON:  February 08, 2024 FINDINGS: Diffuse osteopenia.No evidence of displaced pelvic fracture or diastasis.No acute, displaced hip fracture or dislocation.Lumbar degenerative disc disease.Vascular atherosclerosis throughout the pelvis. External female catheter device. IMPRESSION: While no acute, displaced fracture or dislocation was visualized, the patient's diffuse osteopenia significantly decreases the sensitivity of plain radiography for nondisplaced fracture. Subtle region of cortical buckling in the mid left femoral neck, could represent a nondisplaced fracture. A follow-up MRI or CT of the left hip is recommended for further characterization. These results will be called to the ordering clinician or  representative by the Radiologist Assistant and communication documented in the PACS or [employer]. Electronically Signed   By: Rogelia Myers M.D.   On: 02/29/2024 13:12   DG Shoulder Left Result Date: 02/28/2024 CLINICAL DATA:  Left upper extremity pain.  Recent fall. EXAM: LEFT SHOULDER - 2+ VIEW; LEFT HUMERUS - 2+ VIEW; LEFT ELBOW - COMPLETE 3+ VIEW; LEFT FOREARM - 2 VIEW COMPARISON:  None Available. FINDINGS: Shoulder: Displaced proximal humeral fracture with dominant fracture plane through the surgical neck. Displacement of approximately 10 mm. There may be some peripheral callus formation suggesting this is subacute. No glenohumeral dislocation. Acromioclavicular alignment is maintained. Humerus: Displaced proximal humerus fracture is described. Distal humerus is intact. The bones are subjectively under mineralized. Elbow: Slight irregularity of the radial neck may represent an impaction fracture. Moderate underlying elbow osteoarthritis. There is an elbow joint effusion. Forearm: No additional fracture of the forearm. Distal radius is intact. The ulna  is intact. Wrist alignment is maintained. IMPRESSION: 1. Displaced proximal humerus fracture with dominant fracture plane through the surgical neck. There may be some peripheral callus formation suggesting this is subacute. 2. Slight irregularity of the radial neck may represent an impaction fracture. Elbow joint effusion. 3. No additional fracture of the forearm. Electronically Signed   By: Andrea Gasman M.D.   On: 02/28/2024 20:03   DG Humerus Left Result Date: 02/28/2024 CLINICAL DATA:  Left upper extremity pain.  Recent fall. EXAM: LEFT SHOULDER - 2+ VIEW; LEFT HUMERUS - 2+ VIEW; LEFT ELBOW - COMPLETE 3+ VIEW; LEFT FOREARM - 2 VIEW COMPARISON:  None Available. FINDINGS: Shoulder: Displaced proximal humeral fracture with dominant fracture plane through the surgical neck. Displacement of approximately 10 mm. There may be some peripheral callus  formation suggesting this is subacute. No glenohumeral dislocation. Acromioclavicular alignment is maintained. Humerus: Displaced proximal humerus fracture is described. Distal humerus is intact. The bones are subjectively under mineralized. Elbow: Slight irregularity of the radial neck may represent an impaction fracture. Moderate underlying elbow osteoarthritis. There is an elbow joint effusion. Forearm: No additional fracture of the forearm. Distal radius is intact. The ulna is intact. Wrist alignment is maintained. IMPRESSION: 1. Displaced proximal humerus fracture with dominant fracture plane through the surgical neck. There may be some peripheral callus formation suggesting this is subacute. 2. Slight irregularity of the radial neck may represent an impaction fracture. Elbow joint effusion. 3. No additional fracture of the forearm. Electronically Signed   By: Andrea Gasman M.D.   On: 02/28/2024 20:03   DG Forearm Left Result Date: 02/28/2024 CLINICAL DATA:  Left upper extremity pain.  Recent fall. EXAM: LEFT SHOULDER - 2+ VIEW; LEFT HUMERUS - 2+ VIEW; LEFT ELBOW - COMPLETE 3+ VIEW; LEFT FOREARM - 2 VIEW COMPARISON:  None Available. FINDINGS: Shoulder: Displaced proximal humeral fracture with dominant fracture plane through the surgical neck. Displacement of approximately 10 mm. There may be some peripheral callus formation suggesting this is subacute. No glenohumeral dislocation. Acromioclavicular alignment is maintained. Humerus: Displaced proximal humerus fracture is described. Distal humerus is intact. The bones are subjectively under mineralized. Elbow: Slight irregularity of the radial neck may represent an impaction fracture. Moderate underlying elbow osteoarthritis. There is an elbow joint effusion. Forearm: No additional fracture of the forearm. Distal radius is intact. The ulna is intact. Wrist alignment is maintained. IMPRESSION: 1. Displaced proximal humerus fracture with dominant fracture  plane through the surgical neck. There may be some peripheral callus formation suggesting this is subacute. 2. Slight irregularity of the radial neck may represent an impaction fracture. Elbow joint effusion. 3. No additional fracture of the forearm. Electronically Signed   By: Andrea Gasman M.D.   On: 02/28/2024 20:03   DG Elbow Complete Left Result Date: 02/28/2024 CLINICAL DATA:  Left upper extremity pain.  Recent fall. EXAM: LEFT SHOULDER - 2+ VIEW; LEFT HUMERUS - 2+ VIEW; LEFT ELBOW - COMPLETE 3+ VIEW; LEFT FOREARM - 2 VIEW COMPARISON:  None Available. FINDINGS: Shoulder: Displaced proximal humeral fracture with dominant fracture plane through the surgical neck. Displacement of approximately 10 mm. There may be some peripheral callus formation suggesting this is subacute. No glenohumeral dislocation. Acromioclavicular alignment is maintained. Humerus: Displaced proximal humerus fracture is described. Distal humerus is intact. The bones are subjectively under mineralized. Elbow: Slight irregularity of the radial neck may represent an impaction fracture. Moderate underlying elbow osteoarthritis. There is an elbow joint effusion. Forearm: No additional fracture of the forearm. Distal radius is  intact. The ulna is intact. Wrist alignment is maintained. IMPRESSION: 1. Displaced proximal humerus fracture with dominant fracture plane through the surgical neck. There may be some peripheral callus formation suggesting this is subacute. 2. Slight irregularity of the radial neck may represent an impaction fracture. Elbow joint effusion. 3. No additional fracture of the forearm. Electronically Signed   By: Andrea Gasman M.D.   On: 02/28/2024 20:03   DG Knee Complete 4 Views Left Result Date: 02/28/2024 CLINICAL DATA:  Pain.  Recent fall. EXAM: LEFT KNEE - COMPLETE 4+ VIEW COMPARISON:  None Available. FINDINGS: The bones are subjectively under mineralized. Technically limited assessment due to positioning and  bony under mineralization. There is an oblique displaced distal femoral fracture. Displacement primarily involves the lateral cortex. No obvious intra-articular involvement. Chronic appearing irregularity of the inferior patella. No convincing joint effusion. IMPRESSION: 1. Oblique displaced distal femoral fracture. 2. Chronic appearing irregularity of the inferior patella. Electronically Signed   By: Andrea Gasman M.D.   On: 02/28/2024 19:59   Scheduled Meds:  aspirin  EC  81 mg Oral Daily   diltiazem   240 mg Oral Daily   donepezil   10 mg Oral QHS   enoxaparin  (LOVENOX ) injection  30 mg Subcutaneous Q24H   insulin  aspart  0-15 Units Subcutaneous TID WC   insulin  aspart  0-5 Units Subcutaneous QHS   methocarbamol  500 mg Oral TID   pantoprazole   40 mg Oral Daily   Continuous Infusions:   LOS: 0 days    Rendall Carwin M.D on 02/29/2024 at 2:23 PM  Go to [email or web address] - for contact info  [facility] - Office  [phone]  If 7PM-7AM, please contact night-coverage [email or web address] 02/29/2024, 2:23 PM

## 2024-02-29 NOTE — TOC Initial Note (Signed)
 Transition of Care [facility]) - Initial/Assessment Note    Patient Details  Name: Laura Davenport MRN: [id number] Date of Birth: 12/25/1930  [facility]) CM/SW Contact:    Nena LITTIE Coffee, RN Phone Number: 02/29/2024, 6:28 PM  Expected Discharge Plan: Skilled Nursing Facility Barriers to Discharge: Continued Medical Work up  Expected Discharge Plan and Services In-house Referral: Clinical Social Work Discharge Planning Services: CM Consult   Living arrangements for the past 2 months: Single Family Home                   Prior Living Arrangements/Services Living arrangements for the past 2 months: Single Family Home Lives with:: Adult Children Patient language and need for interpreter reviewed:: Yes        Need for Family Participation in Patient Care: Yes (Comment) Care giver support system in place?: Yes (comment) Current home services: DME (wc) Criminal Activity/Legal Involvement Pertinent to Current Situation/Hospitalization: No - Comment as needed  Activities of Daily Living   Permission Sought/Granted     Emotional Assessment     Alcohol / Substance Use: Not Applicable Psych Involvement: No (comment)  Admission diagnosis:  Inadequate pain control [R52] Closed displaced fracture of distal epiphysis of left femur, initial encounter (HCC) [S72.442A] Closed fracture of proximal end of left humerus, unspecified fracture morphology, initial encounter [S42.202A] Patient Active Problem List   Diagnosis Date Noted   Inadequate pain control 02/28/2024   Closed fracture of left distal femur (HCC) 02/28/2024   Proximal humerus fracture 02/28/2024   UTI (urinary tract infection) 07/16/2023   Diarrhea 07/16/2023   Acute metabolic encephalopathy 07/04/2021   Lobar pneumonia (HCC) 07/04/2021   Pneumonia due to COVID-19 virus 07/01/2021   Sepsis due to gram-negative UTI (HCC) 02/11/2019   Acute cystitis without hematuria    Generalized weakness    Sepsis secondary  to UTI (HCC) 02/09/2019   Acute renal failure superimposed on stage 3 chronic kidney disease (HCC) 02/09/2019   Chest pain on exertion 02/01/2019   Dementia (HCC) 02/01/2019   GERD (gastroesophageal reflux disease) 12/21/2018   Constipation 12/21/2018   Altered mental status 03/24/2017   Anemia in chronic kidney disease 01/20/2016   Chronic renal disease, stage 3, moderately decreased glomerular filtration rate (GFR) between 30-59 mL/min/1.73 square meter (HCC) 01/07/2016   B12 deficiency anemia 10/25/2015   Dysphagia 03/13/2015   AP (abdominal pain)    Hematemesis without nausea    Atrial fibrillation and flutter (HCC)    AKI (acute kidney injury) (HCC)    Bacteremia    Essential hypertension    Septic joint of left knee joint (HCC)    Blood poisoning    Fever 12/22/2014   History of fractured kneecap 12/22/2014   Knee fracture, left 12/22/2014   Sepsis (HCC) 12/22/2014   Tachycardia with heart rate 141-160 beats per minute 12/22/2014   Diabetes mellitus without complication (HCC)    Hypertension    HLD (hyperlipidemia)    Depression    PCP:  Sheryle Carwin, MD Pharmacy:   [facility] [phone] - [place], [place] - [address] AT [address] [place] [address] Phone: [phone] Fax: [phone]  Social Drivers of Health (SDOH) Social History: SDOH Screenings   Food Insecurity: Patient Unable To Answer (07/16/2023)  Housing: Low Risk  (07/16/2023)  Recent Concern: Housing - High Risk (07/16/2023)  Transportation Needs: Patient Unable To Answer (07/16/2023)  Utilities: Patient Unable To  Answer (07/16/2023)  Alcohol Screen: Low Risk  (07/11/2021)  Depression (PHQ2-9): Low Risk  (07/11/2021)  Financial Resource Strain: Low Risk  (04/24/2022)  Physical Activity: Inactive (07/11/2021)  Social Connections: Socially Isolated (07/11/2021)  Stress: No Stress Concern Present (07/11/2021)  Tobacco Use: Low Risk  (02/28/2024)    SDOH Interventions:   Readmission Risk Interventions    07/04/2021    3:38 PM  Readmission Risk Prevention Plan  Transportation Screening Complete  Home Care Screening Complete  Medication Review (RN CM) Complete

## 2024-02-29 NOTE — Progress Notes (Signed)
   02/29/24 1824  TOC Brief Assessment  Insurance and Status Reviewed  Patient has primary care physician Yes  Home environment has been reviewed From home c/daughter  Prior level of function: Assisted  Prior/Current Home Services No current home services  Social Drivers of Health Review SDOH reviewed no interventions necessary  Readmission risk has been reviewed Yes  Transition of care needs no transition of care needs at this time   Transition of Care Department [facility]) has reviewed patient and no TOC needs have been identified at this time. We will continue to monitor patient advancement through interdisciplinary progression rounds. If new patient transition needs arise, please place a TOC consult.

## 2024-02-29 NOTE — Care Management Obs Status (Signed)
 MEDICARE OBSERVATION STATUS NOTIFICATION   Patient Details  Name: Laura Davenport MRN: [id number] Date of Birth: 10/25/1930   Medicare Observation Status Notification Given:  Yes    Nena LITTIE Coffee, RN 02/29/2024, 6:24 PM

## 2024-02-29 NOTE — Plan of Care (Signed)
  Problem: Acute Rehab PT Goals(only PT should resolve) Goal: Pt Will Go Supine/Side To Sit Flowsheets (Taken 02/29/2024 1228) Pt will go Supine/Side to Sit: with moderate assist Goal: Pt Will Go Sit To Supine/Side Flowsheets (Taken 02/29/2024 1228) Pt will go Sit to Supine/Side: with moderate assist Goal: Pt Will Transfer Bed To Chair/Chair To Bed Flowsheets (Taken 02/29/2024 1228) Pt will Transfer Bed to Chair/Chair to Bed: with mod assist

## 2024-02-29 NOTE — Evaluation (Signed)
 Physical Therapy Evaluation Patient Details Name: Laura Davenport MRN: [id number] DOB: 19-Jul-1931 Today's Date: 02/29/2024  History of Present Illness  Per MD HPI: Laura Davenport is a 89 y.o. female with medical history significant of dementia, type 2 diabetes, hypertension, stage III chronic kidney disease.  Patient had a fall 3 weeks ago on her left side.  She was seen in the emergency department and had a hip x-ray which was negative.  She was sent home.  However, the patient has not been able to wheel herself around in her wheelchair, which she normally does.  Additionally, she has had decreased appetite and increasing fatigue.  Her family brought her back to the hospital for evaluation for continued left arm and left knee pain.  There have been no additional falls since 6/8.  Clinical Impression  PT has significant decreased bed mobility.  Pt in fetal position when therapist enters.  It is difficult and painful to straighten out her Rt leg and unable to do so completely, however it is extremely painful to mover the Lt hip in any direction with pt crying out in pain.  No family available but it appears pt could assist in transfers to her wheelchair prior to her fall three weeks ago.         If plan is discharge home, recommend the following: Two people to help with walking and/or transfers;A lot of help with bathing/dressing/bathroom;Assistance with cooking/housework;Assist for transportation;Help with stairs or ramp for entrance   Can travel by private vehicle   No    Equipment Recommendations None recommended by PT  Recommendations for Other Services    none   Functional Status Assessment Patient has had a recent decline in their functional status and/or demonstrates limited ability to make significant improvements in function in a reasonable and predictable amount of time     Precautions / Restrictions Precautions Precautions: Fall Required Braces or Orthoses: Sling (LT  UE) Restrictions Weight Bearing Restrictions Per Provider Order: No      Mobility  Bed Mobility Overal bed mobility: Needs Assistance Bed Mobility: Rolling, Sidelying to Sit Rolling: Max assist Sidelying to sit: Max assist            Transfers Overall transfer level:  (unable due to pain)                          Pertinent Vitals/Pain Pain Assessment Pain Assessment: Faces Faces Pain Scale: Hurts worst (with movement of LT LE)    Home Living Family/patient expects to be discharged to:: Private residence (information per chart as pt is unable to state any information.) Living Arrangements: Children Available Help at Discharge: Family Type of Home: Apartment         Home Layout: One level Home Equipment: Agricultural consultant (2 wheels);Standard Walker      Prior Function Prior Level of Function : Needs assist             Mobility Comments: was wheeling herself in the wheelchair ADLs Comments: assisted by family     Extremity/Trunk Assessment        Lower Extremity Assessment Lower Extremity Assessment: Difficult to assess due to impaired cognition       Communication        Cognition Arousal: Alert Behavior During Therapy: WFL for tasks assessed/performed   PT - Cognitive impairments: History of cognitive impairments  Following commands: Impaired Following commands impaired: Follows one step commands inconsistently     Cueing Cueing Techniques: Verbal cues            Assessment/Plan    PT Assessment Patient needs continued PT services  PT Problem List Decreased strength;Decreased range of motion;Decreased activity tolerance;Decreased mobility;Pain       PT Treatment Interventions Functional mobility training;Therapeutic exercise    PT Goals (Current goals can be found in the Care Plan section)  Acute Rehab PT Goals Patient Stated Goal: unknown PT Goal Formulation: Patient unable to  participate in goal setting    Frequency Min 3X/week        AM-PAC PT 6 Clicks Mobility  Outcome Measure Help needed turning from your back to your side while in a flat bed without using bedrails?: A Lot Help needed moving from lying on your back to sitting on the side of a flat bed without using bedrails?: A Lot Help needed moving to and from a bed to a chair (including a wheelchair)?: Total Help needed standing up from a chair using your arms (e.g., wheelchair or bedside chair)?: Total Help needed to walk in hospital room?: Total Help needed climbing 3-5 steps with a railing? : Total 6 Click Score: 7    End of Session   Activity Tolerance: Patient limited by pain Patient left: in bed;with nursing/sitter in room   PT Visit Diagnosis: Muscle weakness (generalized) (M62.81);History of falling (Z91.81);Difficulty in walking, not elsewhere classified (R26.2)    Time: 1210-1220 PT Time Calculation (min) (ACUTE ONLY): 10 min   Charges:   PT Evaluation $PT Eval Low Complexity: 1 Low             Josie Burleigh, PT CLT [phone]  02/29/2024, 12:34 PM

## 2024-03-01 DIAGNOSIS — S72442A Displaced fracture of lower epiphysis (separation) of left femur, initial encounter for closed fracture: Secondary | ICD-10-CM | POA: Diagnosis not present

## 2024-03-01 DIAGNOSIS — R52 Pain, unspecified: Secondary | ICD-10-CM | POA: Diagnosis not present

## 2024-03-01 LAB — RENAL FUNCTION PANEL
Albumin: 2.5 g/dL — ABNORMAL LOW (ref 3.5–5.0)
Anion gap: 10 (ref 5–15)
BUN: 25 mg/dL — ABNORMAL HIGH (ref 8–23)
CO2: 21 mmol/L — ABNORMAL LOW (ref 22–32)
Calcium: 8.5 mg/dL — ABNORMAL LOW (ref 8.9–10.3)
Chloride: 107 mmol/L (ref 98–111)
Creatinine, Ser: 1.27 mg/dL — ABNORMAL HIGH (ref 0.44–1.00)
GFR, Estimated: 40 mL/min — ABNORMAL LOW (ref >60)
Glucose, Bld: 106 mg/dL — ABNORMAL HIGH (ref 70–99)
Phosphorus: 3.6 mg/dL (ref 2.5–4.6)
Potassium: 4.4 mmol/L (ref 3.5–5.1)
Sodium: 138 mmol/L (ref 135–145)

## 2024-03-01 LAB — GLUCOSE, CAPILLARY
Glucose-Capillary: 96 mg/dL (ref 70–99)
Glucose-Capillary: 136 mg/dL — ABNORMAL HIGH (ref 70–99)
Glucose-Capillary: 80 mg/dL (ref 70–99)
Glucose-Capillary: 172 mg/dL — ABNORMAL HIGH (ref 70–99)

## 2024-03-01 LAB — HEMOGLOBIN AND HEMATOCRIT, BLOOD
HCT: 26.4 % — ABNORMAL LOW (ref 36.0–46.0)
Hemoglobin: 8.5 g/dL — ABNORMAL LOW (ref 12.0–15.0)

## 2024-03-01 NOTE — Plan of Care (Signed)
  Problem: Clinical Measurements: Goal: Ability to maintain clinical measurements within normal limits will improve Outcome: Progressing Goal: Will remain free from infection Outcome: Progressing Goal: Cardiovascular complication will be avoided Outcome: Progressing   Problem: Elimination: Goal: Will not experience complications related to bowel motility Outcome: Progressing Goal: Will not experience complications related to urinary retention Outcome: Progressing   Problem: Education: Goal: Knowledge of General Education information will improve Description: Including pain rating scale, medication(s)/side effects and non-pharmacologic comfort measures Outcome: Not Progressing   Problem: Activity: Goal: Risk for activity intolerance will decrease Outcome: Not Progressing   Problem: Nutrition: Goal: Adequate nutrition will be maintained Outcome: Not Progressing

## 2024-03-01 NOTE — Progress Notes (Signed)
 Physical Therapy Treatment Patient Details Name: Laura Davenport MRN: [id number] DOB: [birth year] Today's Date: 03/01/2024   History of Present Illness Laura Davenport is a 89 y.o. female with medical history significant of dementia, type 2 diabetes, hypertension, stage III chronic kidney disease.  Patient had a fall 3 weeks ago on her left side.  She was seen in the emergency department and had a hip x-ray which was negative.  She was sent home.  However, the patient has not been able to wheel herself around in her wheelchair, which she normally does.  Additionally, she has had decreased appetite and increasing fatigue.  Her family brought her back to the hospital for evaluation for continued left arm and left knee pain.  There have been no additional falls since 6/8.    PT Comments  Patient demonstrates slow labored movement for sitting up at bedside with c/o severe pain in LUE and LLE with movement, unable to maintain standing balance and required Max/total assist stand pivot with right knee blocked to transfer to chair. Patient tolerated sitting up in chair after therapy. Patient will benefit from continued skilled physical therapy in hospital and recommended venue below to increase strength, balance, endurance for safe ADLs and gait.       If plan is discharge home, recommend the following: A lot of help with bathing/dressing/bathroom;A lot of help with walking and/or transfers;Help with stairs or ramp for entrance;Assistance with cooking/housework   Can travel by private vehicle     No  Equipment Recommendations  None recommended by PT    Recommendations for Other Services       Precautions / Restrictions Precautions Precautions: Fall Recall of Precautions/Restrictions: Impaired Required Braces or Orthoses: Sling Restrictions Weight Bearing Restrictions Per Provider Order: Yes LUE Weight Bearing Per Provider Order: Non weight bearing     Mobility  Bed Mobility Overal bed  mobility: Needs Assistance Bed Mobility: Supine to Sit Rolling: Max assist Sidelying to sit: Max assist Supine to sit: Max assist     General bed mobility comments: slow labored movement with c/o severe pain LUE, LUE    Transfers Overall transfer level: Needs assistance Equipment used: 1 person hand held assist Transfers: Sit to/from Stand, Bed to chair/wheelchair/BSC Sit to Stand: Max assist, Total assist Stand pivot transfers: Max assist, Total assist         General transfer comment: unsteady labored movement with poor tolerance for weighbearing on LLE due to increased pain    Ambulation/Gait                   Stairs             Wheelchair Mobility     Tilt Bed    Modified Rankin (Stroke Patients Only)       Balance Overall balance assessment: Needs assistance Sitting-balance support: Feet supported, No upper extremity supported Sitting balance-Leahy Scale: Poor   Postural control: Posterior lean Standing balance support: During functional activity, Single extremity supported Standing balance-Leahy Scale: Poor Standing balance comment: hand held assist                            Communication Communication Communication: Impaired Factors Affecting Communication: Reduced clarity of speech  Cognition Arousal: Alert Behavior During Therapy: WFL for tasks assessed/performed   PT - Cognitive impairments: History of cognitive impairments  Following commands: Impaired Following commands impaired: Follows one step commands inconsistently    Cueing Cueing Techniques: Verbal cues, Tactile cues  Exercises      General Comments        Pertinent Vitals/Pain Pain Assessment Pain Assessment: Faces Faces Pain Scale: Hurts even more Pain Location: mostly LUE, LLE Pain Descriptors / Indicators: Grimacing, Moaning, Sharp, Guarding Pain Intervention(s): Limited activity within patient's tolerance,  Monitored during session, Repositioned, Premedicated before session    Home Living Family/patient expects to be discharged to:: Private residence Living Arrangements: Children Available Help at Discharge: Family;Available 24 hours/day Type of Home: House Home Access: Ramped entrance       Home Layout: One level Home Equipment: Agricultural consultant (2 wheels);Shower seat - built in;Grab bars - tub/shower;Wheelchair - Careers adviser (comment) (hoyer lift)      Prior Function            PT Goals (current goals can now be found in the care plan section) Acute Rehab PT Goals Patient Stated Goal: return home with family to assist PT Goal Formulation: With patient Progress towards PT goals: Progressing toward goals    Frequency    Min 3X/week      PT Plan      Co-evaluation PT/OT/SLP Co-Evaluation/Treatment: Yes Reason for Co-Treatment: To address functional/ADL transfers PT goals addressed during session: Mobility/safety with mobility;Balance OT goals addressed during session: ADL's and self-care      AM-PAC PT 6 Clicks Mobility   Outcome Measure  Help needed turning from your back to your side while in a flat bed without using bedrails?: A Lot Help needed moving from lying on your back to sitting on the side of a flat bed without using bedrails?: A Lot Help needed moving to and from a bed to a chair (including a wheelchair)?: A Lot Help needed standing up from a chair using your arms (e.g., wheelchair or bedside chair)?: A Lot Help needed to walk in hospital room?: Total Help needed climbing 3-5 steps with a railing? : Total 6 Click Score: 10    End of Session   Activity Tolerance: Patient tolerated treatment well;Patient limited by fatigue Patient left: in chair;with call bell/phone within reach;with chair alarm set Nurse Communication: Mobility status PT Visit Diagnosis: Muscle weakness (generalized) (M62.81);History of falling (Z91.81);Difficulty in walking, not  elsewhere classified (R26.2)     Time: 9160-9099 PT Time Calculation (min) (ACUTE ONLY): 21 min  Charges:    $Therapeutic Activity: 8-22 mins PT General Charges $$ ACUTE PT VISIT: 1 Visit                     12:26 PM, 03/01/24 Lynwood Music, MPT Physical Therapist with [facility] [phone] office [phone] mobile phone

## 2024-03-01 NOTE — Progress Notes (Signed)
 PROGRESS NOTE   Laura Davenport, is a 89 y.o. female, DOB - 06/09/1931, FMW:[id number]  Admit date - 02/28/2024   Admitting Physician Lang JINNY Peel, DO  Outpatient Primary MD for the patient is Sheryle Carwin, MD  LOS - 0  Chief Complaint  Patient presents with   Fall       Brief Narrative:  89 y.o. female with medical history significant of dementia, DM2, HTN,   and CKD 3B admitted on 02/22/2024 with left hip and knee area pain after falling at home on 02/08/24.  Apparently no additional falls since 02/08/2024 -- Patient requiring pain control at this time   -Assessment and Plan: 1)Lt Oblique displaced distal femoral fracture--  - CT of the left hip without acute hip fracture -.  CT of the left knee shows comminuted displaced oblique fracture of the distal femoral metadiaphysis, as described above. Fracture margins extend medially to the proximal aspect of the nonweightbearing medial femoral condyle. No definite evidence of intra-articular extension. -C/n  scheduled methocarbamol and Tylenol  - Use as needed oxycodone  Discussed with orthopedic surgeon Dr. Selinda Gosling who recommends no surgical/operative intervention at this time -- Dr. Selinda Gosling recommends TDWB to LLE and  WBAT to LUE -- Orthopedic follow-up with [facility] in 2 weeks recommended  2)Closed subacute displaced proximal humerus fracture -- with dominant fracture plane through the surgical neck - Orthopedic surgeon recommends shoulder sling, WBAT to LUE and outpatient follow-up in 2 weeks  3)Dementia--- advanced dementia with significant cognitive and memory deficits -Continue Aricept  and supportive care  4)CKD stage - 3B --  creatinine on admission=  1.50 (close to baseline) -Creatinine improved to 1.27 with hydration - renally adjust medications, avoid nephrotoxic agents / dehydration  / hypotension  5)PAFib--- continue Cardizem  for rate control - Patient was Not on full anticoagulation PTA - Given falls and  fracture will not start full anticoagulation  6)GERD--continue Protonix   7)DM2--A1c 6.2 reflecting excellent diabetic control PTA Use Novolog /Humalog Sliding scale insulin  with Accu-Cheks/Fingersticks as ordered   8) acute on chronic anemia--- patient with chronic anemia of CKD - Hgb now down to 9.4 from baseline around 11, most likely due to fracture related Acute blood loss compounded by hemodilution from hydration  Status is: Inpatient   Disposition: The patient is from: Home              Anticipated d/c is to: Home with [facility]               Anticipated d/c date is: 1 day              Patient currently is not medically stable to d/c. Barriers: Not Clinically Stable-   Code Status :  -  Code Status: Limited: Do not attempt resuscitation (DNR) -DNR-LIMITED -Do Not Intubate/DNI    Family Communication:   Left voicemail for patient's daughter Elyzabeth, unable to reach patient's daughter Devere  DVT Prophylaxis  :   - SCDs  enoxaparin  (LOVENOX ) injection 30 mg Start: 02/29/24 1000   Lab Results  Component Value Date   PLT 393 02/28/2024    Inpatient Medications  Scheduled Meds:  acetaminophen   500 mg Oral QID   aspirin  EC  81 mg Oral Daily   diltiazem   240 mg Oral Daily   donepezil   10 mg Oral QHS   enoxaparin  (LOVENOX ) injection  30 mg Subcutaneous Q24H   insulin  aspart  0-15 Units Subcutaneous TID WC   insulin  aspart  0-5 Units Subcutaneous QHS   methocarbamol  500 mg Oral TID   pantoprazole   40 mg Oral Daily   Continuous Infusions: PRN Meds:.ondansetron  **OR** ondansetron  (ZOFRAN ) IV, mouth rinse, oxyCODONE    Anti-infectives (From admission, onward)    None      Subjective: Demisha Nokes today has no fevers, no emesis,  No chest pain,   - --Sitting up in the chair - Cooperative with physical therapy - Oral intake is fair, not great  Objective: Vitals:   02/29/24 1434 02/29/24 2002 03/01/24 0359 03/01/24 1313  BP: (!) 107/50 (!) 97/51 125/79 118/71  Pulse: 89 80  86 77  Resp: 20 14 14 16   Temp: 98.2 F (36.8 C) 98 F (36.7 C) 98.2 F (36.8 C) 98.3 F (36.8 C)  TempSrc: Oral  Oral Oral  SpO2: 99% 100% 99% 100%  Weight:        Intake/Output Summary (Last 24 hours) at 03/01/2024 1406 Last data filed at 03/01/2024 1313 Gross per 24 hour  Intake 120 ml  Output 500 ml  Net -380 ml   Filed Weights   02/28/24 2357  Weight: 56.2 kg    Physical Exam  Gen:- Awake Alert, pleasantly confused HEENT:- [place].AT, No sclera icterus, edentulous Neck-Supple Neck,No JVD,.  Lungs-  CTAB , fair symmetrical air movement CV- S1, S2 normal, regular  Abd-  +ve B.Sounds, Abd Soft, No tenderness,    Extremity/Skin:- No  edema, pedal pulses present  Psych-advanced cognitive and memory deficits due to underlying dementia Neuro-generalized weakness, no new focal deficits, no tremors MSK--left lower extremity shortened and rotated, discomfort/pain with range of motion - Left shoulder sling in situ  Data Reviewed: I have personally reviewed following labs and imaging studies  CBC: Recent Labs  Lab 02/28/24 2135 03/01/24 0425  WBC 9.0  --   NEUTROABS 5.7  --   HGB 9.4* 8.5*  HCT 29.6* 26.4*  MCV 98.3  --   PLT 393  --    Basic Metabolic Panel: Recent Labs  Lab 02/28/24 2135 03/01/24 0425  NA 134* 138  K 4.8 4.4  CL 103 107  CO2 19* 21*  GLUCOSE 139* 106*  BUN 31* 25*  CREATININE 1.50* 1.27*  CALCIUM  8.6* 8.5*  PHOS  --  3.6   GFR: Estimated Creatinine Clearance: 24.9 mL/min (A) (by C-G formula based on SCr of 1.27 mg/dL (H)).  HbA1C: Recent Labs    02/29/24 0345  HGBA1C 6.2*   Radiology Studies: CT HIP LEFT WO CONTRAST Result Date: 02/29/2024 CLINICAL DATA:  Pain after recent fall. EXAM: CT OF THE LEFT HIP WITHOUT CONTRAST TECHNIQUE: Multidetector CT imaging of the left hip was performed according to the standard protocol. Multiplanar CT image reconstructions were also generated. RADIATION DOSE REDUCTION: This exam was performed  according to the departmental dose-optimization program which includes automated exposure control, adjustment of the mA and/or kV according to patient size and/or use of iterative reconstruction technique. COMPARISON:  Left hip radiographs dated 02/08/2024. FINDINGS: Bones/Joint/Cartilage No acute fracture or dislocation. The left femoral head is seated within the acetabulum. Mild-to-moderate osteoarthritis of the left hip with joint space narrowing and osteophytosis. Left greater trochanteric enthesopathy. Pubic symphysis is anatomically aligned with degenerative changes. Ligaments Ligaments are suboptimally evaluated by CT. Muscles and Tendons Muscles are within normal limits for age. No intramuscular fluid collection or hematoma. Soft tissue No fluid collection or hematoma. No soft tissue mass. The rectum is distended with stool measuring up to 7.7 cm in diameter. The bladder is partially distended with circumferential bladder wall thickening.  IMPRESSION: 1. No acute osseous abnormality of the left hip. 2. Mild-to-moderate osteoarthritis of the left hip. 3. Large volume of stool distending the rectum. 4. Circumferential bladder wall thickening may be due to underdistention or cystitis. Recommend correlation with urinalysis. Electronically Signed   By: Harrietta Sherry M.D.   On: 02/29/2024 17:41   CT KNEE LEFT WO CONTRAST Result Date: 02/29/2024 CLINICAL DATA:  Pain after fall. EXAM: CT OF THE LEFT KNEE WITHOUT CONTRAST TECHNIQUE: Multidetector CT imaging of the left knee was performed according to the standard protocol. Multiplanar CT image reconstructions were also generated. RADIATION DOSE REDUCTION: This exam was performed according to the departmental dose-optimization program which includes automated exposure control, adjustment of the mA and/or kV according to patient size and/or use of iterative reconstruction technique. COMPARISON:  Left knee radiographs dated 02/28/2024. FINDINGS:  Bones/Joint/Cartilage Diffuse osseous demineralization. Comminuted oblique fracture of the distal femoral metadiaphysis with approximately 11 mm of lateral displacement at the level of the lateral supracondylar fracture margin. The fracture extends medially to the proximal aspect of the nonweightbearing medial femoral condyle (series 5, image 85). No definite evidence of intra-articular extension. Chronic deformity of the inferior patella may relate to sequela of prior trauma. Moderate to severe tricompartmental osteoarthritis with severe lateral and medial femorotibial compartment joint space narrowing. Small to moderate joint effusion. Ligaments Ligaments are suboptimally evaluated by CT. Muscles and Tendons No discrete intramuscular collection identified. Soft tissue Soft tissue swelling of the distal thigh and knee. No loculated fluid collection. Peripheral vascular calcifications. IMPRESSION: 1. Comminuted displaced oblique fracture of the distal femoral metadiaphysis, as described above. Fracture margins extend medially to the proximal aspect of the nonweightbearing medial femoral condyle. No definite evidence of intra-articular extension. 2. Chronic deformity of the inferior patella may relate to sequela of prior trauma. 3. Moderate to severe tricompartmental osteoarthritis. 4. Small to moderate-sized joint effusion. Electronically Signed   By: Harrietta Sherry M.D.   On: 02/29/2024 17:32   DG HIP UNILAT W OR W/O PELVIS 2-3 VIEWS LEFT Result Date: 02/29/2024 CLINICAL DATA:  [id number] Hip pain [id number] EXAM: DG HIP (WITH OR WITHOUT PELVIS) 2-3V LEFT COMPARISON:  February 08, 2024 FINDINGS: Diffuse osteopenia.No evidence of displaced pelvic fracture or diastasis.No acute, displaced hip fracture or dislocation.Lumbar degenerative disc disease.Vascular atherosclerosis throughout the pelvis. External female catheter device. IMPRESSION: While no acute, displaced fracture or dislocation was visualized, the patient's  diffuse osteopenia significantly decreases the sensitivity of plain radiography for nondisplaced fracture. Subtle region of cortical buckling in the mid left femoral neck, could represent a nondisplaced fracture. A follow-up MRI or CT of the left hip is recommended for further characterization. These results will be called to the ordering clinician or representative by the Radiologist Assistant and communication documented in the PACS or [employer]. Electronically Signed   By: Rogelia Myers M.D.   On: 02/29/2024 13:12   DG Shoulder Left Result Date: 02/28/2024 CLINICAL DATA:  Left upper extremity pain.  Recent fall. EXAM: LEFT SHOULDER - 2+ VIEW; LEFT HUMERUS - 2+ VIEW; LEFT ELBOW - COMPLETE 3+ VIEW; LEFT FOREARM - 2 VIEW COMPARISON:  None Available. FINDINGS: Shoulder: Displaced proximal humeral fracture with dominant fracture plane through the surgical neck. Displacement of approximately 10 mm. There may be some peripheral callus formation suggesting this is subacute. No glenohumeral dislocation. Acromioclavicular alignment is maintained. Humerus: Displaced proximal humerus fracture is described. Distal humerus is intact. The bones are subjectively under mineralized. Elbow: Slight irregularity of the radial neck may represent an  impaction fracture. Moderate underlying elbow osteoarthritis. There is an elbow joint effusion. Forearm: No additional fracture of the forearm. Distal radius is intact. The ulna is intact. Wrist alignment is maintained. IMPRESSION: 1. Displaced proximal humerus fracture with dominant fracture plane through the surgical neck. There may be some peripheral callus formation suggesting this is subacute. 2. Slight irregularity of the radial neck may represent an impaction fracture. Elbow joint effusion. 3. No additional fracture of the forearm. Electronically Signed   By: Andrea Gasman M.D.   On: 02/28/2024 20:03   DG Humerus Left Result Date: 02/28/2024 CLINICAL DATA:  Left  upper extremity pain.  Recent fall. EXAM: LEFT SHOULDER - 2+ VIEW; LEFT HUMERUS - 2+ VIEW; LEFT ELBOW - COMPLETE 3+ VIEW; LEFT FOREARM - 2 VIEW COMPARISON:  None Available. FINDINGS: Shoulder: Displaced proximal humeral fracture with dominant fracture plane through the surgical neck. Displacement of approximately 10 mm. There may be some peripheral callus formation suggesting this is subacute. No glenohumeral dislocation. Acromioclavicular alignment is maintained. Humerus: Displaced proximal humerus fracture is described. Distal humerus is intact. The bones are subjectively under mineralized. Elbow: Slight irregularity of the radial neck may represent an impaction fracture. Moderate underlying elbow osteoarthritis. There is an elbow joint effusion. Forearm: No additional fracture of the forearm. Distal radius is intact. The ulna is intact. Wrist alignment is maintained. IMPRESSION: 1. Displaced proximal humerus fracture with dominant fracture plane through the surgical neck. There may be some peripheral callus formation suggesting this is subacute. 2. Slight irregularity of the radial neck may represent an impaction fracture. Elbow joint effusion. 3. No additional fracture of the forearm. Electronically Signed   By: Andrea Gasman M.D.   On: 02/28/2024 20:03   DG Forearm Left Result Date: 02/28/2024 CLINICAL DATA:  Left upper extremity pain.  Recent fall. EXAM: LEFT SHOULDER - 2+ VIEW; LEFT HUMERUS - 2+ VIEW; LEFT ELBOW - COMPLETE 3+ VIEW; LEFT FOREARM - 2 VIEW COMPARISON:  None Available. FINDINGS: Shoulder: Displaced proximal humeral fracture with dominant fracture plane through the surgical neck. Displacement of approximately 10 mm. There may be some peripheral callus formation suggesting this is subacute. No glenohumeral dislocation. Acromioclavicular alignment is maintained. Humerus: Displaced proximal humerus fracture is described. Distal humerus is intact. The bones are subjectively under mineralized.  Elbow: Slight irregularity of the radial neck may represent an impaction fracture. Moderate underlying elbow osteoarthritis. There is an elbow joint effusion. Forearm: No additional fracture of the forearm. Distal radius is intact. The ulna is intact. Wrist alignment is maintained. IMPRESSION: 1. Displaced proximal humerus fracture with dominant fracture plane through the surgical neck. There may be some peripheral callus formation suggesting this is subacute. 2. Slight irregularity of the radial neck may represent an impaction fracture. Elbow joint effusion. 3. No additional fracture of the forearm. Electronically Signed   By: Andrea Gasman M.D.   On: 02/28/2024 20:03   DG Elbow Complete Left Result Date: 02/28/2024 CLINICAL DATA:  Left upper extremity pain.  Recent fall. EXAM: LEFT SHOULDER - 2+ VIEW; LEFT HUMERUS - 2+ VIEW; LEFT ELBOW - COMPLETE 3+ VIEW; LEFT FOREARM - 2 VIEW COMPARISON:  None Available. FINDINGS: Shoulder: Displaced proximal humeral fracture with dominant fracture plane through the surgical neck. Displacement of approximately 10 mm. There may be some peripheral callus formation suggesting this is subacute. No glenohumeral dislocation. Acromioclavicular alignment is maintained. Humerus: Displaced proximal humerus fracture is described. Distal humerus is intact. The bones are subjectively under mineralized. Elbow: Slight irregularity of the radial neck  may represent an impaction fracture. Moderate underlying elbow osteoarthritis. There is an elbow joint effusion. Forearm: No additional fracture of the forearm. Distal radius is intact. The ulna is intact. Wrist alignment is maintained. IMPRESSION: 1. Displaced proximal humerus fracture with dominant fracture plane through the surgical neck. There may be some peripheral callus formation suggesting this is subacute. 2. Slight irregularity of the radial neck may represent an impaction fracture. Elbow joint effusion. 3. No additional fracture of  the forearm. Electronically Signed   By: Andrea Gasman M.D.   On: 02/28/2024 20:03   DG Knee Complete 4 Views Left Result Date: 02/28/2024 CLINICAL DATA:  Pain.  Recent fall. EXAM: LEFT KNEE - COMPLETE 4+ VIEW COMPARISON:  None Available. FINDINGS: The bones are subjectively under mineralized. Technically limited assessment due to positioning and bony under mineralization. There is an oblique displaced distal femoral fracture. Displacement primarily involves the lateral cortex. No obvious intra-articular involvement. Chronic appearing irregularity of the inferior patella. No convincing joint effusion. IMPRESSION: 1. Oblique displaced distal femoral fracture. 2. Chronic appearing irregularity of the inferior patella. Electronically Signed   By: Andrea Gasman M.D.   On: 02/28/2024 19:59   Scheduled Meds:  acetaminophen   500 mg Oral QID   aspirin  EC  81 mg Oral Daily   diltiazem   240 mg Oral Daily   donepezil   10 mg Oral QHS   enoxaparin  (LOVENOX ) injection  30 mg Subcutaneous Q24H   insulin  aspart  0-15 Units Subcutaneous TID WC   insulin  aspart  0-5 Units Subcutaneous QHS   methocarbamol  500 mg Oral TID   pantoprazole   40 mg Oral Daily   Continuous Infusions:   LOS: 0 days    Rendall Carwin M.D on 03/01/2024 at 2:06 PM  Go to [email or web address] - for contact info  [facility] - Office  [phone]  If 7PM-7AM, please contact night-coverage [email or web address] 03/01/2024, 2:06 PM

## 2024-03-01 NOTE — TOC Progression Note (Signed)
 Transition of Care [facility]) - Progression Note    Patient Details  Name: Laura Davenport MRN: [id number] Date of Birth: Jun 22, 1931  Transition of Care [facility]) CM/SW Contact  Noreen KATHEE [place], [place] Phone Number: 03/01/2024, 11:52 AM  Clinical Narrative:     CSW spoke with daughter Candise about PT recommendation for SNF.  Daughter asked the reason and shared that pt tried a SNF once before and that it did not go well because pt would not participate. Daughter then shared that they tried outpatient PT and it also did not work , patient would not participate and was DC. Daughter suggested for pt to return home with [facility] since she is familiar with being in the home and comfortable. Daughter stated that her sister lives with patient and assist with patient overall ADL care. Spoke with [name] with [facility] and they were able to accept the referral for HHPT/HHOT. TOC to follow.   Expected Discharge Plan: Home w Home Health Services Barriers to Discharge: Continued Medical Work up  Expected Discharge Plan and Services In-house Referral: Clinical Social Work Discharge Planning Services: CM Consult Post Acute Care Choice: Durable Medical Equipment Living arrangements for the past 2 months: Single Family Home                           HH Arranged: PT, OT HH Agency: [facility] ([facility]) Date HH Agency Contacted: 03/01/24 Time HH Agency Contacted: 1152 Representative spoke with at [facility] Agency: [name]   Social Determinants of Health (SDOH) Interventions SDOH Screenings   Food Insecurity: Patient Unable To Answer (03/01/2024)  Housing: Low Risk  (07/16/2023)  Recent Concern: Housing - High Risk (07/16/2023)  Transportation Needs: Patient Unable To Answer (07/16/2023)  Utilities: Patient Unable To Answer (07/16/2023)  Alcohol Screen: Low Risk  (07/11/2021)  Depression (PHQ2-9): Low Risk  (07/11/2021)  Financial Resource Strain: Low Risk  (04/24/2022)  Physical Activity: Inactive  (07/11/2021)  Social Connections: Socially Isolated (07/11/2021)  Stress: No Stress Concern Present (07/11/2021)  Tobacco Use: Low Risk  (02/28/2024)    Readmission Risk Interventions    07/04/2021    3:38 PM  Readmission Risk Prevention Plan  Transportation Screening Complete  Home Care Screening Complete  Medication Review (RN CM) Complete

## 2024-03-01 NOTE — Plan of Care (Signed)
  Problem: Acute Rehab OT Goals (only OT should resolve) Goal: Pt. Will Perform Eating Flowsheets (Taken 03/01/2024 0947) Pt Will Perform Eating: with set-up Goal: Pt. Will Perform Grooming Flowsheets (Taken 03/01/2024 0947) Pt Will Perform Grooming: with set-up Goal: Pt. Will Perform Upper Body Dressing Flowsheets (Taken 03/01/2024 0947) Pt Will Perform Upper Body Dressing:  with supervision  with contact guard assist Goal: Pt. Will Transfer To Toilet Flowsheets (Taken 03/01/2024 [phone]) Pt Will Transfer to Toilet:  with mod assist  stand pivot transfer Goal: Pt/Caregiver Will Perform Home Exercise Program Flowsheets (Taken 03/01/2024 [phone]) Pt/caregiver will Perform Home Exercise Program:  Increased ROM  Increased strength  Right Upper extremity  With minimal assist  Jori Frerichs OT, MOT

## 2024-03-01 NOTE — Evaluation (Signed)
 Occupational Therapy Evaluation Patient Details Name: Laura Davenport MRN: [id number] DOB: Sep 13, 1930 Today's Date: 03/01/2024   History of Present Illness   YUDITH NORLANDER is a 89 y.o. female with medical history significant of dementia, type 2 diabetes, hypertension, stage III chronic kidney disease.  Patient had a fall 3 weeks ago on her left side.  She was seen in the emergency department and had a hip x-ray which was negative.  She was sent home.  However, the patient has not been able to wheel herself around in her wheelchair, which she normally does.  Additionally, she has had decreased appetite and increasing fatigue.  Her family brought her back to the hospital for evaluation for continued left arm and left knee pain.  There have been no additional falls since 6/8. (per DO)     Clinical Impressions Pt agreeable to OT and PT co-evaluation. Pt has required increased assist at home recently. Today pt required max to total assist for transfer to chair without AD. Max A needed for bed mobility and at least mod to max for most ADL's. Lower body Adl's likely require max to total assist. Difficult to assess UE strength. L UE in a sling that was adjusted for better fit during the session. Daughter reports family is likely equipped to care for pt at home. Pt left in the chair with call bell within reach and chair alarm set. Pt will benefit from continued OT in the hospital and recommended venue below to increase strength, balance, and endurance for safe ADL's.        If plan is discharge home, recommend the following:   A lot of help with walking and/or transfers;A lot of help with bathing/dressing/bathroom;Assistance with cooking/housework;Assistance with feeding;Assist for transportation;Help with stairs or ramp for entrance;Direct supervision/assist for medications management;Supervision due to cognitive status     Functional Status Assessment   Patient has had a recent decline in their  functional status and demonstrates the ability to make significant improvements in function in a reasonable and predictable amount of time.     Equipment Recommendations   None recommended by OT             Precautions/Restrictions   Precautions Precautions: Fall Recall of Precautions/Restrictions: Impaired Required Braces or Orthoses: Sling (L UE) Restrictions Weight Bearing Restrictions Per Provider Order: No (none found in documentation)     Mobility Bed Mobility Overal bed mobility: Needs Assistance Bed Mobility: Supine to Sit     Supine to sit: Max assist     General bed mobility comments: labored movement; much assist    Transfers Overall transfer level: Needs assistance   Transfers: Sit to/from Stand, Bed to chair/wheelchair/BSC Sit to Stand: Max assist, Total assist Stand pivot transfers: Max assist, Total assist         General transfer comment: stand pivot to chair from bed done by PT with max/total assist without AD.      Balance Overall balance assessment: Needs assistance Sitting-balance support: Single extremity supported, Feet supported Sitting balance-Leahy Scale: Poor Sitting balance - Comments: seated at EOB Postural control: Posterior lean Standing balance support: Single extremity supported Standing balance-Leahy Scale: Poor Standing balance comment: Assisted by PT                           ADL either performed or assessed with clinical judgement   ADL Overall ADL's : Needs assistance/impaired Eating/Feeding: Moderate assistance;Sitting;Minimal assistance   Grooming: Moderate assistance;Sitting  Upper Body Bathing: Maximal assistance;Moderate assistance;Sitting   Lower Body Bathing: Maximal assistance;Total assistance;Sitting/lateral leans   Upper Body Dressing : Moderate assistance;Sitting;Maximal assistance   Lower Body Dressing: Maximal assistance;Total assistance;Sitting/lateral leans   Toilet Transfer:  Maximal assistance;Total assistance;Stand-pivot Toilet Transfer Details (indicate cue type and reason): EOB to chair without AD Toileting- Clothing Manipulation and Hygiene: Total assistance Toileting - Clothing Manipulation Details (indicate cue type and reason): Pt wears pull-ups at baseline.             Vision Baseline Vision/History: 1 Wears glasses;4 Cataracts Ability to See in Adequate Light: 1 Impaired Patient Visual Report: No change from baseline Vision Assessment?:  (baseline vision issues)     Perception Perception: Not tested       Praxis Praxis: Not tested       Pertinent Vitals/Pain Pain Assessment Pain Assessment: Faces Faces Pain Scale: Hurts even more Pain Location: Unspecified Pain Descriptors / Indicators: Grimacing, Moaning Pain Intervention(s): Monitored during session, Repositioned, Limited activity within patient's tolerance     Extremity/Trunk Assessment Upper Extremity Assessment Upper Extremity Assessment: LUE deficits/detail;RUE deficits/detail;Difficult to assess due to impaired cognition RUE Deficits / Details: Difficult to assess. ~75% P/ROM for shoulder flexion. 3+/5 grasp. LUE Deficits / Details: In sling.   Lower Extremity Assessment Lower Extremity Assessment: Defer to PT evaluation   Cervical / Trunk Assessment Cervical / Trunk Assessment: Kyphotic   Communication Communication Communication: Impaired Factors Affecting Communication: Reduced clarity of speech   Cognition Arousal: Alert Behavior During Therapy: WFL for tasks assessed/performed Cognition: History of cognitive impairments                               Following commands: Impaired Following commands impaired: Follows one step commands inconsistently     Cueing  General Comments   Cueing Techniques: Verbal cues;Tactile cues                 Home Living Family/patient expects to be discharged to:: Private residence Living Arrangements:  Children Available Help at Discharge: Family;Available 24 hours/day Type of Home: House Home Access: Ramped entrance     Home Layout: One level     Bathroom Shower/Tub: Producer, television/film/video: Handicapped height Bathroom Accessibility: Yes How Accessible: Accessible via wheelchair Home Equipment: Agricultural consultant (2 wheels);Shower seat - built in;Grab bars - tub/shower;Wheelchair - manual;Other (comment) (hoyer lift)          Prior Functioning/Environment Prior Level of Function : Needs assist       Physical Assist : ADLs (physical);Mobility (physical) Mobility (physical): Transfers;Gait;Stairs ADLs (physical): Feeding;Grooming;Bathing;Dressing;Toileting;IADLs Mobility Comments: Pt was able to self-propel the w/c for mobility in the home. Daughter reports pt required around min A for transfer to and from w/c. ADLs Comments: Assited for all ADL's. Set up assist for feeding.    OT Problem List: Decreased strength;Decreased range of motion;Decreased activity tolerance;Impaired balance (sitting and/or standing);Decreased cognition;Decreased coordination;Decreased safety awareness;Decreased knowledge of use of DME or AE;Impaired UE functional use;Pain   OT Treatment/Interventions: Self-care/ADL training;Therapeutic exercise;DME and/or AE instruction;Energy conservation;Therapeutic activities;Cognitive remediation/compensation;Patient/family education;Balance training      OT Goals(Current goals can be found in the care plan section)   Acute Rehab OT Goals Patient Stated Goal: return home OT Goal Formulation: With family Time For Goal Achievement: 03/15/24 Potential to Achieve Goals: Fair   OT Frequency:  Min 2X/week    Co-evaluation PT/OT/SLP Co-Evaluation/Treatment: Yes Reason for Co-Treatment: To address functional/ADL transfers  OT goals addressed during session: ADL's and self-care      AM-PAC OT 6 Clicks Daily Activity     Outcome Measure Help from  another person eating meals?: A Lot Help from another person taking care of personal grooming?: A Lot Help from another person toileting, which includes using toliet, bedpan, or urinal?: A Lot Help from another person bathing (including washing, rinsing, drying)?: A Lot Help from another person to put on and taking off regular upper body clothing?: A Lot Help from another person to put on and taking off regular lower body clothing?: Total 6 Click Score: 11   End of Session Nurse Communication: Other (comment) (Nusre present during some of session.)  Activity Tolerance: Patient limited by pain Patient left: in chair;with call bell/phone within reach;with chair alarm set  OT Visit Diagnosis: Unsteadiness on feet (R26.81);Other abnormalities of gait and mobility (R26.89);Muscle weakness (generalized) (M62.81);History of falling (Z91.81);Other symptoms and signs involving cognitive function                Time: 9157-9140 OT Time Calculation (min): 17 min Charges:  OT General Charges $OT Visit: 1 Visit OT Evaluation $OT Eval Low Complexity: 1 Low  Cadyn Rodger OT, MOT   Jayson Person 03/01/2024, 9:45 AM

## 2024-03-02 DIAGNOSIS — Z7401 Bed confinement status: Secondary | ICD-10-CM | POA: Diagnosis not present

## 2024-03-02 DIAGNOSIS — S72442A Displaced fracture of lower epiphysis (separation) of left femur, initial encounter for closed fracture: Secondary | ICD-10-CM | POA: Diagnosis not present

## 2024-03-02 DIAGNOSIS — R6889 Other general symptoms and signs: Secondary | ICD-10-CM | POA: Diagnosis not present

## 2024-03-02 DIAGNOSIS — R404 Transient alteration of awareness: Secondary | ICD-10-CM | POA: Diagnosis not present

## 2024-03-02 DIAGNOSIS — R52 Pain, unspecified: Secondary | ICD-10-CM | POA: Diagnosis not present

## 2024-03-02 LAB — CBC
HCT: 29.8 % — ABNORMAL LOW (ref 36.0–46.0)
Hemoglobin: 9 g/dL — ABNORMAL LOW (ref 12.0–15.0)
MCH: 29.8 pg (ref 26.0–34.0)
MCHC: 30.2 g/dL (ref 30.0–36.0)
MCV: 98.7 fL (ref 80.0–100.0)
Platelets: 375 10*3/uL (ref 150–400)
RBC: 3.02 MIL/uL — ABNORMAL LOW (ref 3.87–5.11)
RDW: 14.5 % (ref 11.5–15.5)
WBC: 5 10*3/uL (ref 4.0–10.5)
nRBC: 0 % (ref 0.0–0.2)

## 2024-03-02 LAB — GLUCOSE, CAPILLARY
Glucose-Capillary: 110 mg/dL — ABNORMAL HIGH (ref 70–99)
Glucose-Capillary: 118 mg/dL — ABNORMAL HIGH (ref 70–99)
Glucose-Capillary: 86 mg/dL (ref 70–99)

## 2024-03-02 MED ORDER — ACETAMINOPHEN 500 MG PO TABS: 500.0000 mg | ORAL_TABLET | Freq: Four times a day (QID) | ORAL | Status: AC | PRN

## 2024-03-02 MED ORDER — DILTIAZEM HCL ER COATED BEADS 240 MG PO CP24: 240.0000 mg | ORAL_CAPSULE | Freq: Every day | ORAL | 11 refills | Status: AC

## 2024-03-02 MED ORDER — OXYCODONE HCL 5 MG PO TABS
5.0000 mg | ORAL_TABLET | ORAL | 0 refills | Status: DC | PRN
Start: 2024-03-02 — End: 2024-06-03

## 2024-03-02 MED ORDER — DONEPEZIL HCL 10 MG PO TABS: 10.0000 mg | ORAL_TABLET | Freq: Every day | ORAL | 11 refills | Status: AC

## 2024-03-02 MED ORDER — METHOCARBAMOL 500 MG PO TABS: 500.0000 mg | ORAL_TABLET | Freq: Three times a day (TID) | ORAL | 0 refills | Status: AC

## 2024-03-02 MED ORDER — PANTOPRAZOLE SODIUM 40 MG PO TBEC: DELAYED_RELEASE_TABLET | ORAL | 3 refills | Status: AC

## 2024-03-02 MED ORDER — ASPIRIN 81 MG PO TBEC: 81.0000 mg | DELAYED_RELEASE_TABLET | Freq: Every day | ORAL | 11 refills | Status: AC

## 2024-03-02 NOTE — Plan of Care (Signed)

## 2024-03-02 NOTE — Discharge Summary (Signed)
 Laura Davenport, is a 89 y.o. female  DOB 08/09/1931  MRN [id number].  Admission date:  02/28/2024  Admitting Physician  No admitting provider for patient encounter.  Discharge Date:  03/02/2024   Primary MD  Sheryle Carwin, MD  Recommendations for primary care physician for things to follow:   1)Avoid ibuprofen /Advil /Aleve/Motrin Josefine Powders/Naproxen/BC powders/Meloxicam/Diclofenac/Indomethacin and other Nonsteroidal anti-inflammatory medications as these will make you more likely to bleed and can cause stomach ulcers, can also cause Kidney problems.  2)Follow up With orthopedic surgeon Dr. Selinda Dover Rogers--[facility] [place]--- in 2 weeks at [address], [place] [address] , [phone]   Admission Diagnosis  Inadequate pain control [R52] Closed displaced fracture of distal epiphysis of left femur, initial encounter [facility]) [S72.442A] Closed fracture of proximal end of left humerus, unspecified fracture morphology, initial encounter [S42.202A]   Discharge Diagnosis  Inadequate pain control [R52] Closed displaced fracture of distal epiphysis of left femur, initial encounter (HCC) [S72.442A] Closed fracture of proximal end of left humerus, unspecified fracture morphology, initial encounter [S42.202A]    Principal Problem:   Inadequate pain control Active Problems:   Diabetes mellitus without complication (HCC)   Essential hypertension   Atrial fibrillation and flutter (HCC)   Chronic renal disease, stage 3, moderately decreased glomerular filtration rate (GFR) between 30-59 mL/min/1.73 square meter (HCC)   GERD (gastroesophageal reflux disease)   Dementia (HCC)   Closed fracture of left distal femur (HCC)   Proximal humerus fracture      Past Medical History:  Diagnosis Date   Anemia in chronic kidney disease    B12 deficiency anemia    Chronic renal disease, stage 3,  moderately decreased glomerular filtration rate (GFR) between 30-59 mL/min/1.73 square meter (HCC)    Dementia (HCC)    Depression    Essential hypertension    GI bleed    Severe gastritis and duodenal ulcers by EGD May 2016    Hyperlipidemia    PSVT (paroxysmal supraventricular tachycardia) (HCC)    Septic arthritis (HCC)    MRSA, left knee    Type 2 diabetes mellitus (HCC)     Past Surgical History:  Procedure Laterality Date   CHOLECYSTECTOMY N/A 11/04/2012   Procedure: LAPAROSCOPIC CHOLECYSTECTOMY;  Surgeon: Oneil DELENA Budge, MD;  Location: [facility] ORS;  Service: General;  Laterality: N/A;   ESOPHAGOGASTRODUODENOSCOPY N/A 01/06/2015   SLF: mild esophagitis   I & D EXTREMITY Left 12/24/2014   Procedure: IRRIGATION AND DEBRIDEMENT EXTREMITY AND ARTHROSCOPY KNEE;  Surgeon: Evalene JONETTA Chancy, MD;  Location: [facility] OR;  Service: Orthopedics;  Laterality: Left;   KNEE SURGERY     SAVORY DILATION  01/06/2015   Procedure: SAVORY DILATION;  Surgeon: Margo LITTIE Haddock, MD;  Location: [facility] ENDO SUITE;  Service: Endoscopy;;     HPI  from the history and physical done on the day of admission:   HPI: Laura Davenport is a 89 y.o. female with medical history significant of dementia, type 2 diabetes, hypertension, stage III chronic kidney disease.  Patient had  a fall 3 weeks ago on her left side.  She was seen in the emergency department and had a hip x-ray which was negative.  She was sent home.  However, the patient has not been able to wheel herself around in her wheelchair, which she normally does.  Additionally, she has had decreased appetite and increasing fatigue.  Her family brought her back to the hospital for evaluation for continued left arm and left knee pain.  There have been no additional falls since 6/8.   Patient unable to provide review of systems, however her daughter states that she has not had any fevers, chills, shortness of breath, chest pain, nausea, vomiting, abdominal pain.   Review of Systems: As  mentioned in the history of present illness. All other systems reviewed and are negative.    Hospital Course:   Brief Narrative:  89 y.o. female with medical history significant of dementia, DM2, HTN,   and CKD 3B admitted on 02/22/2024 with left hip and knee area pain after falling at home on 02/08/24.  Apparently no additional falls since 02/08/2024 -- Patient requiring pain control at this time   -Assessment and Plan: 1)Lt Oblique displaced distal femoral fracture--  - CT of the left hip without acute hip fracture -.  CT of the left knee shows comminuted displaced oblique fracture of the distal femoral metadiaphysis, as described above. Fracture margins extend medially to the proximal aspect of the nonweightbearing medial femoral condyle. No definite evidence of intra-articular extension. -C/n  scheduled methocarbamol  and Tylenol  - Use as needed oxycodone  Discussed with orthopedic surgeon Dr. Selinda Gosling who recommends no surgical/operative intervention at this time -- Dr. Selinda Gosling recommends TDWB to LLE and  WBAT to LUE -- Orthopedic follow-up with [facility] in 2 weeks recommended   2)Closed subacute displaced proximal humerus fracture -- with dominant fracture plane through the surgical neck - Orthopedic surgeon recommends shoulder sling, WBAT to LUE and outpatient follow-up in 2 weeks   3)Dementia--- advanced dementia with significant cognitive and memory deficits -Continue Aricept  and supportive care   4)CKD stage - 3B --  creatinine on admission=  1.50 (close to baseline) -Creatinine improved to 1.27 with hydration - renally adjust medications, avoid nephrotoxic agents / dehydration  / hypotension   5)PAFib--- continue Cardizem  for rate control - Patient was Not on full anticoagulation PTA - Given falls and fracture will not start full anticoagulation at this time   6)GERD--continue Protonix    7)DM2--A1c 6.2 reflecting excellent diabetic control PTA - Resume PTA  diabetic regimen   8) acute on chronic anemia--- patient with chronic anemia of CKD - Hgb now down to 9.4 from baseline around 11, most likely due to fracture related Acute blood loss compounded by hemodilution from hydration   Disposition: The patient is from: Home              Anticipated d/c is to: Home with [facility]    Discharge Condition: stable  Follow UP   Follow-up Information     [facility] Follow [facility].   Specialty: Home Health Services Why: [facility] will call to schedule start of care for HHPT/HHOT.        Gosling Selinda Dover, MD. Call in 2 week(s).   Specialty: Orthopedic Surgery Contact information: [address] [place] [address] [place] [address] [phone]                 Consults obtained -orthopedics  Diet and Activity recommendation:  As advised  Discharge  Instructions    Discharge Instructions     Call MD for:  difficulty breathing, headache or visual disturbances   Complete by: As directed    Call MD for:  persistant dizziness or light-headedness   Complete by: As directed    Call MD for:  persistant nausea and vomiting   Complete by: As directed    Call MD for:  temperature >100.4   Complete by: As directed    Diet general   Complete by: As directed    Discharge instructions   Complete by: As directed    1)Avoid ibuprofen /Advil /Aleve/Motrin /Goody Powders/Naproxen/BC powders/Meloxicam/Diclofenac/Indomethacin and other Nonsteroidal anti-inflammatory medications as these will make you more likely to bleed and can cause stomach ulcers, can also cause Kidney problems.  2)Follow up With orthopedic surgeon Dr. Selinda Dover Rogers--[facility] [place]--- in 2 weeks at [address], [place] [address] , [phone]   Increase activity slowly   Complete by: As directed          Discharge Medications     Allergies as of 03/02/2024       Reactions   Oxycodone          Medication List      TAKE these medications    acetaminophen  500 MG tablet Commonly known as: TYLENOL  Take 1 tablet (500 mg total) by mouth every 6 (six) hours as needed for mild pain (pain score 1-3).   aspirin  EC 81 MG tablet Take 1 tablet (81 mg total) by mouth daily with breakfast. What changed: when to take this   diltiazem  240 MG 24 hr capsule Commonly known as: CARDIZEM  CD Take 1 capsule (240 mg total) by mouth daily.   donepezil  10 MG tablet Commonly known as: ARICEPT  Take 1 tablet (10 mg total) by mouth at bedtime.   methocarbamol  500 MG tablet Commonly known as: ROBAXIN  Take 1 tablet (500 mg total) by mouth 3 (three) times daily.   oxyCODONE  5 MG immediate release tablet Commonly known as: Oxy IR/ROXICODONE  Take 1 tablet (5 mg total) by mouth every 4 (four) hours as needed for moderate pain (pain score 4-6).   pantoprazole  40 MG tablet Commonly known as: PROTONIX  TAKE 1 TABLET(40 MG) BY MOUTH DAILY before breakfast   vitamin B-12 250 MCG tablet Commonly known as: CYANOCOBALAMIN  Take 250 mcg by mouth daily.   Vitamin D3 25 MCG Caps Take 1,000 Units by mouth 3 (three) times a week. Monday, Wednesday, Friday        Major procedures and Radiology Reports - PLEASE review detailed and final reports for all details, in brief -   CT HIP LEFT WO CONTRAST Result Date: 02/29/2024 CLINICAL DATA:  Pain after recent fall. EXAM: CT OF THE LEFT HIP WITHOUT CONTRAST TECHNIQUE: Multidetector CT imaging of the left hip was performed according to the standard protocol. Multiplanar CT image reconstructions were also generated. RADIATION DOSE REDUCTION: This exam was performed according to the departmental dose-optimization program which includes automated exposure control, adjustment of the mA and/or kV according to patient size and/or use of iterative reconstruction technique. COMPARISON:  Left hip radiographs dated 02/08/2024. FINDINGS: Bones/Joint/Cartilage No acute fracture or dislocation. The  left femoral head is seated within the acetabulum. Mild-to-moderate osteoarthritis of the left hip with joint space narrowing and osteophytosis. Left greater trochanteric enthesopathy. Pubic symphysis is anatomically aligned with degenerative changes. Ligaments Ligaments are suboptimally evaluated by CT. Muscles and Tendons Muscles are within normal limits for age. No intramuscular fluid collection or hematoma. Soft tissue  No fluid collection or hematoma. No soft tissue mass. The rectum is distended with stool measuring up to 7.7 cm in diameter. The bladder is partially distended with circumferential bladder wall thickening. IMPRESSION: 1. No acute osseous abnormality of the left hip. 2. Mild-to-moderate osteoarthritis of the left hip. 3. Large volume of stool distending the rectum. 4. Circumferential bladder wall thickening may be due to underdistention or cystitis. Recommend correlation with urinalysis. Electronically Signed   By: Harrietta Sherry M.D.   On: 02/29/2024 17:41   CT KNEE LEFT WO CONTRAST Result Date: 02/29/2024 CLINICAL DATA:  Pain after fall. EXAM: CT OF THE LEFT KNEE WITHOUT CONTRAST TECHNIQUE: Multidetector CT imaging of the left knee was performed according to the standard protocol. Multiplanar CT image reconstructions were also generated. RADIATION DOSE REDUCTION: This exam was performed according to the departmental dose-optimization program which includes automated exposure control, adjustment of the mA and/or kV according to patient size and/or use of iterative reconstruction technique. COMPARISON:  Left knee radiographs dated 02/28/2024. FINDINGS: Bones/Joint/Cartilage Diffuse osseous demineralization. Comminuted oblique fracture of the distal femoral metadiaphysis with approximately 11 mm of lateral displacement at the level of the lateral supracondylar fracture margin. The fracture extends medially to the proximal aspect of the nonweightbearing medial femoral condyle (series 5, image  85). No definite evidence of intra-articular extension. Chronic deformity of the inferior patella may relate to sequela of prior trauma. Moderate to severe tricompartmental osteoarthritis with severe lateral and medial femorotibial compartment joint space narrowing. Small to moderate joint effusion. Ligaments Ligaments are suboptimally evaluated by CT. Muscles and Tendons No discrete intramuscular collection identified. Soft tissue Soft tissue swelling of the distal thigh and knee. No loculated fluid collection. Peripheral vascular calcifications. IMPRESSION: 1. Comminuted displaced oblique fracture of the distal femoral metadiaphysis, as described above. Fracture margins extend medially to the proximal aspect of the nonweightbearing medial femoral condyle. No definite evidence of intra-articular extension. 2. Chronic deformity of the inferior patella may relate to sequela of prior trauma. 3. Moderate to severe tricompartmental osteoarthritis. 4. Small to moderate-sized joint effusion. Electronically Signed   By: Harrietta Sherry M.D.   On: 02/29/2024 17:32   DG HIP UNILAT W OR W/O PELVIS 2-3 VIEWS LEFT Result Date: 02/29/2024 CLINICAL DATA:  [id number] Hip pain [id number] EXAM: DG HIP (WITH OR WITHOUT PELVIS) 2-3V LEFT COMPARISON:  February 08, 2024 FINDINGS: Diffuse osteopenia.No evidence of displaced pelvic fracture or diastasis.No acute, displaced hip fracture or dislocation.Lumbar degenerative disc disease.Vascular atherosclerosis throughout the pelvis. External female catheter device. IMPRESSION: While no acute, displaced fracture or dislocation was visualized, the patient's diffuse osteopenia significantly decreases the sensitivity of plain radiography for nondisplaced fracture. Subtle region of cortical buckling in the mid left femoral neck, could represent a nondisplaced fracture. A follow-up MRI or CT of the left hip is recommended for further characterization. These results will be called to the ordering clinician  or representative by the Radiologist Assistant and communication documented in the PACS or [employer]. Electronically Signed   By: Rogelia Myers M.D.   On: 02/29/2024 13:12   DG Shoulder Left Result Date: 02/28/2024 CLINICAL DATA:  Left upper extremity pain.  Recent fall. EXAM: LEFT SHOULDER - 2+ VIEW; LEFT HUMERUS - 2+ VIEW; LEFT ELBOW - COMPLETE 3+ VIEW; LEFT FOREARM - 2 VIEW COMPARISON:  None Available. FINDINGS: Shoulder: Displaced proximal humeral fracture with dominant fracture plane through the surgical neck. Displacement of approximately 10 mm. There may be some peripheral callus formation suggesting this is subacute. No glenohumeral  dislocation. Acromioclavicular alignment is maintained. Humerus: Displaced proximal humerus fracture is described. Distal humerus is intact. The bones are subjectively under mineralized. Elbow: Slight irregularity of the radial neck may represent an impaction fracture. Moderate underlying elbow osteoarthritis. There is an elbow joint effusion. Forearm: No additional fracture of the forearm. Distal radius is intact. The ulna is intact. Wrist alignment is maintained. IMPRESSION: 1. Displaced proximal humerus fracture with dominant fracture plane through the surgical neck. There may be some peripheral callus formation suggesting this is subacute. 2. Slight irregularity of the radial neck may represent an impaction fracture. Elbow joint effusion. 3. No additional fracture of the forearm. Electronically Signed   By: Andrea Gasman M.D.   On: 02/28/2024 20:03   DG Humerus Left Result Date: 02/28/2024 CLINICAL DATA:  Left upper extremity pain.  Recent fall. EXAM: LEFT SHOULDER - 2+ VIEW; LEFT HUMERUS - 2+ VIEW; LEFT ELBOW - COMPLETE 3+ VIEW; LEFT FOREARM - 2 VIEW COMPARISON:  None Available. FINDINGS: Shoulder: Displaced proximal humeral fracture with dominant fracture plane through the surgical neck. Displacement of approximately 10 mm. There may be some peripheral  callus formation suggesting this is subacute. No glenohumeral dislocation. Acromioclavicular alignment is maintained. Humerus: Displaced proximal humerus fracture is described. Distal humerus is intact. The bones are subjectively under mineralized. Elbow: Slight irregularity of the radial neck may represent an impaction fracture. Moderate underlying elbow osteoarthritis. There is an elbow joint effusion. Forearm: No additional fracture of the forearm. Distal radius is intact. The ulna is intact. Wrist alignment is maintained. IMPRESSION: 1. Displaced proximal humerus fracture with dominant fracture plane through the surgical neck. There may be some peripheral callus formation suggesting this is subacute. 2. Slight irregularity of the radial neck may represent an impaction fracture. Elbow joint effusion. 3. No additional fracture of the forearm. Electronically Signed   By: Andrea Gasman M.D.   On: 02/28/2024 20:03   DG Forearm Left Result Date: 02/28/2024 CLINICAL DATA:  Left upper extremity pain.  Recent fall. EXAM: LEFT SHOULDER - 2+ VIEW; LEFT HUMERUS - 2+ VIEW; LEFT ELBOW - COMPLETE 3+ VIEW; LEFT FOREARM - 2 VIEW COMPARISON:  None Available. FINDINGS: Shoulder: Displaced proximal humeral fracture with dominant fracture plane through the surgical neck. Displacement of approximately 10 mm. There may be some peripheral callus formation suggesting this is subacute. No glenohumeral dislocation. Acromioclavicular alignment is maintained. Humerus: Displaced proximal humerus fracture is described. Distal humerus is intact. The bones are subjectively under mineralized. Elbow: Slight irregularity of the radial neck may represent an impaction fracture. Moderate underlying elbow osteoarthritis. There is an elbow joint effusion. Forearm: No additional fracture of the forearm. Distal radius is intact. The ulna is intact. Wrist alignment is maintained. IMPRESSION: 1. Displaced proximal humerus fracture with dominant  fracture plane through the surgical neck. There may be some peripheral callus formation suggesting this is subacute. 2. Slight irregularity of the radial neck may represent an impaction fracture. Elbow joint effusion. 3. No additional fracture of the forearm. Electronically Signed   By: Andrea Gasman M.D.   On: 02/28/2024 20:03   DG Elbow Complete Left Result Date: 02/28/2024 CLINICAL DATA:  Left upper extremity pain.  Recent fall. EXAM: LEFT SHOULDER - 2+ VIEW; LEFT HUMERUS - 2+ VIEW; LEFT ELBOW - COMPLETE 3+ VIEW; LEFT FOREARM - 2 VIEW COMPARISON:  None Available. FINDINGS: Shoulder: Displaced proximal humeral fracture with dominant fracture plane through the surgical neck. Displacement of approximately 10 mm. There may be some peripheral callus formation suggesting this is  subacute. No glenohumeral dislocation. Acromioclavicular alignment is maintained. Humerus: Displaced proximal humerus fracture is described. Distal humerus is intact. The bones are subjectively under mineralized. Elbow: Slight irregularity of the radial neck may represent an impaction fracture. Moderate underlying elbow osteoarthritis. There is an elbow joint effusion. Forearm: No additional fracture of the forearm. Distal radius is intact. The ulna is intact. Wrist alignment is maintained. IMPRESSION: 1. Displaced proximal humerus fracture with dominant fracture plane through the surgical neck. There may be some peripheral callus formation suggesting this is subacute. 2. Slight irregularity of the radial neck may represent an impaction fracture. Elbow joint effusion. 3. No additional fracture of the forearm. Electronically Signed   By: Andrea Gasman M.D.   On: 02/28/2024 20:03   DG Knee Complete 4 Views Left Result Date: 02/28/2024 CLINICAL DATA:  Pain.  Recent fall. EXAM: LEFT KNEE - COMPLETE 4+ VIEW COMPARISON:  None Available. FINDINGS: The bones are subjectively under mineralized. Technically limited assessment due to  positioning and bony under mineralization. There is an oblique displaced distal femoral fracture. Displacement primarily involves the lateral cortex. No obvious intra-articular involvement. Chronic appearing irregularity of the inferior patella. No convincing joint effusion. IMPRESSION: 1. Oblique displaced distal femoral fracture. 2. Chronic appearing irregularity of the inferior patella. Electronically Signed   By: Andrea Gasman M.D.   On: 02/28/2024 19:59   DG HIP UNILAT W OR W/O PELVIS 2-3 VIEWS LEFT Result Date: 02/08/2024 CLINICAL DATA:  Hip pain status post fall. History of Alzheimer's disease. EXAM: DG HIP (WITH OR WITHOUT PELVIS) 2-3V LEFT COMPARISON:  CT pelvis 11/01/2012. FINDINGS: The bones are diffusely demineralized. No evidence of acute fracture or dislocation. The hip joint spaces appear preserved. There are mild degenerative changes within the lower lumbar spine and sacroiliac joints. Scattered vascular calcifications are noted. IMPRESSION: No evidence of acute fracture or dislocation. Diffuse osseous demineralization. If the patient has persistent hip pain or inability to bear weight, follow up imaging may be warranted as acute hip fractures can be radiographically occult in the elderly. Electronically Signed   By: Elsie Perone M.D.   On: 02/08/2024 11:29    Today   Subjective    Laura Davenport today has no new complaints        -  pain control improving No fever  Or chills    Patient has been seen and examined prior to discharge   Objective   Blood pressure 128/65, pulse 82, temperature 98 F (36.7 C), temperature source Oral, resp. rate 14, weight 56.2 kg, SpO2 100%.   Intake/Output Summary (Last 24 hours) at 03/02/2024 1224 Last data filed at 03/02/2024 0900 Gross per 24 hour  Intake 480 ml  Output 250 ml  Net 230 ml    Exam Gen:- Awake Alert, no acute distress  HEENT:- [place].AT, No sclera icterus Neck-Supple Neck,No JVD,.  Lungs-  CTAB , good air movement  bilaterally CV- S1, S2 normal, regular Abd-  +ve B.Sounds, Abd Soft, No tenderness,    Extremity/Skin:- No  edema,   good pulses Psych-advanced cognitive and memory deficits due to underlying dementia Neuro-generalized weakness, no new focal deficits, no tremors MSK--left lower extremity shortened and rotated, discomfort/pain with range of motion - Left shoulder sling in situ   Data Review   CBC w Diff:  Lab Results  Component Value Date   WBC 5.0 03/02/2024   HGB 9.0 (L) 03/02/2024   HCT 29.8 (L) 03/02/2024   PLT 375 03/02/2024   LYMPHOPCT 27 02/28/2024  MONOPCT 8 02/28/2024   EOSPCT 1 02/28/2024   BASOPCT 0 02/28/2024    CMP:  Lab Results  Component Value Date   NA 138 03/01/2024   K 4.4 03/01/2024   CL 107 03/01/2024   CO2 21 (L) 03/01/2024   BUN 25 (H) 03/01/2024   CREATININE 1.27 (H) 03/01/2024   PROT 7.2 01/27/2024   ALBUMIN 2.5 (L) 03/01/2024   BILITOT 1.0 01/27/2024   ALKPHOS 71 01/27/2024   AST 17 01/27/2024   ALT 10 01/27/2024  .  Total Discharge time is about 33 minutes  Rendall Carwin M.D on 03/02/2024 at 12:24 PM  Go to [email or web address] -  for contact info  [facility] - Office  [phone]

## 2024-03-02 NOTE — Discharge Instructions (Addendum)
 1)Avoid ibuprofen /Advil /Aleve/Motrin Josefine Powders/Naproxen/BC powders/Meloxicam/Diclofenac/Indomethacin and other Nonsteroidal anti-inflammatory medications as these will make you more likely to bleed and can cause stomach ulcers, can also cause Kidney problems.  2)Follow up With orthopedic surgeon Dr. Selinda Dover Rogers--[facility] [place]--- in 2 weeks at [address], [address], [place] [address] , [phone]

## 2024-03-02 NOTE — TOC Transition Note (Signed)
 Transition of Care [facility]) - Discharge Note   Patient Details  Name: Laura Davenport MRN: [id number] Date of Birth: 04-18-1931  Transition [facility] CM/SW Contact:  Mcarthur Saddie Kim, LCSW Phone Number: 03/02/2024, 9:32 AM   Clinical Narrative:  Pt d/c today with [facility] HHPT/OT. [name] with [facility] notified of d/c. Home health orders in.      Final next level of care: Home w Home Health Services Barriers to Discharge: Barriers Resolved   Patient Goals and CMS Choice Patient states their goals for this hospitalization and ongoing recovery are:: return back home CMS Medicare.gov Compare Post Acute Care list provided to:: Patient Represenative (must comment) (daughterStarlette Thurow) Choice offered to / list presented to : Adult Children (Daughter - [facility])      Discharge Placement                       Discharge Plan and Services Additional resources added to the After Visit Summary for   In-house Referral: Clinical Social Work Discharge Planning Services: CM Consult Post Acute Care Choice: Durable Medical Equipment                    HH Arranged: PT, OT HH Agency: [facility] Date HH Agency Contacted: 03/01/24 Time HH Agency Contacted: 1152 Representative spoke with at [facility] Agency: [name]  Social Drivers of Health (SDOH) Interventions SDOH Screenings   Food Insecurity: Patient Unable To Answer (03/01/2024)  Housing: Low Risk  (07/16/2023)  Recent Concern: Housing - High Risk (07/16/2023)  Transportation Needs: Patient Unable To Answer (07/16/2023)  Utilities: Patient Unable To Answer (07/16/2023)  Alcohol Screen: Low Risk  (07/11/2021)  Depression (PHQ2-9): Low Risk  (07/11/2021)  Financial Resource Strain: Low Risk  (04/24/2022)  Physical Activity: Inactive (07/11/2021)  Social Connections: Socially Isolated (07/11/2021)  Stress: No Stress Concern Present (07/11/2021)  Tobacco Use: Low Risk  (02/28/2024)     Readmission Risk Interventions     07/04/2021    3:38 PM  Readmission Risk Prevention Plan  Transportation Screening Complete  Home Care Screening Complete  Medication Review (RN CM) Complete

## 2024-03-06 DIAGNOSIS — Z556 Problems related to health literacy: Secondary | ICD-10-CM | POA: Diagnosis not present

## 2024-03-06 DIAGNOSIS — I480 Paroxysmal atrial fibrillation: Secondary | ICD-10-CM | POA: Diagnosis not present

## 2024-03-06 DIAGNOSIS — Z993 Dependence on wheelchair: Secondary | ICD-10-CM | POA: Diagnosis not present

## 2024-03-06 DIAGNOSIS — S72402D Unspecified fracture of lower end of left femur, subsequent encounter for closed fracture with routine healing: Secondary | ICD-10-CM | POA: Diagnosis not present

## 2024-03-06 DIAGNOSIS — I129 Hypertensive chronic kidney disease with stage 1 through stage 4 chronic kidney disease, or unspecified chronic kidney disease: Secondary | ICD-10-CM | POA: Diagnosis not present

## 2024-03-06 DIAGNOSIS — M1612 Unilateral primary osteoarthritis, left hip: Secondary | ICD-10-CM | POA: Diagnosis not present

## 2024-03-06 DIAGNOSIS — K219 Gastro-esophageal reflux disease without esophagitis: Secondary | ICD-10-CM | POA: Diagnosis not present

## 2024-03-06 DIAGNOSIS — E1122 Type 2 diabetes mellitus with diabetic chronic kidney disease: Secondary | ICD-10-CM | POA: Diagnosis not present

## 2024-03-06 DIAGNOSIS — Z9181 History of falling: Secondary | ICD-10-CM | POA: Diagnosis not present

## 2024-03-06 DIAGNOSIS — D519 Vitamin B12 deficiency anemia, unspecified: Secondary | ICD-10-CM | POA: Diagnosis not present

## 2024-03-06 DIAGNOSIS — N1832 Chronic kidney disease, stage 3b: Secondary | ICD-10-CM | POA: Diagnosis not present

## 2024-03-06 DIAGNOSIS — I471 Supraventricular tachycardia, unspecified: Secondary | ICD-10-CM | POA: Diagnosis not present

## 2024-03-06 DIAGNOSIS — D631 Anemia in chronic kidney disease: Secondary | ICD-10-CM | POA: Diagnosis not present

## 2024-03-06 DIAGNOSIS — E785 Hyperlipidemia, unspecified: Secondary | ICD-10-CM | POA: Diagnosis not present

## 2024-03-06 DIAGNOSIS — Z7982 Long term (current) use of aspirin: Secondary | ICD-10-CM | POA: Diagnosis not present

## 2024-03-06 DIAGNOSIS — S42212D Unspecified displaced fracture of surgical neck of left humerus, subsequent encounter for fracture with routine healing: Secondary | ICD-10-CM | POA: Diagnosis not present

## 2024-03-06 DIAGNOSIS — I4892 Unspecified atrial flutter: Secondary | ICD-10-CM | POA: Diagnosis not present

## 2024-03-10 DIAGNOSIS — I129 Hypertensive chronic kidney disease with stage 1 through stage 4 chronic kidney disease, or unspecified chronic kidney disease: Secondary | ICD-10-CM | POA: Diagnosis not present

## 2024-03-10 DIAGNOSIS — D519 Vitamin B12 deficiency anemia, unspecified: Secondary | ICD-10-CM | POA: Diagnosis not present

## 2024-03-10 DIAGNOSIS — Z993 Dependence on wheelchair: Secondary | ICD-10-CM | POA: Diagnosis not present

## 2024-03-10 DIAGNOSIS — I471 Supraventricular tachycardia, unspecified: Secondary | ICD-10-CM | POA: Diagnosis not present

## 2024-03-10 DIAGNOSIS — Z9181 History of falling: Secondary | ICD-10-CM | POA: Diagnosis not present

## 2024-03-10 DIAGNOSIS — I4892 Unspecified atrial flutter: Secondary | ICD-10-CM | POA: Diagnosis not present

## 2024-03-10 DIAGNOSIS — E1122 Type 2 diabetes mellitus with diabetic chronic kidney disease: Secondary | ICD-10-CM | POA: Diagnosis not present

## 2024-03-10 DIAGNOSIS — Z7982 Long term (current) use of aspirin: Secondary | ICD-10-CM | POA: Diagnosis not present

## 2024-03-10 DIAGNOSIS — Z556 Problems related to health literacy: Secondary | ICD-10-CM | POA: Diagnosis not present

## 2024-03-10 DIAGNOSIS — S72402D Unspecified fracture of lower end of left femur, subsequent encounter for closed fracture with routine healing: Secondary | ICD-10-CM | POA: Diagnosis not present

## 2024-03-10 DIAGNOSIS — S42212D Unspecified displaced fracture of surgical neck of left humerus, subsequent encounter for fracture with routine healing: Secondary | ICD-10-CM | POA: Diagnosis not present

## 2024-03-10 DIAGNOSIS — D631 Anemia in chronic kidney disease: Secondary | ICD-10-CM | POA: Diagnosis not present

## 2024-03-10 DIAGNOSIS — M1612 Unilateral primary osteoarthritis, left hip: Secondary | ICD-10-CM | POA: Diagnosis not present

## 2024-03-10 DIAGNOSIS — N1832 Chronic kidney disease, stage 3b: Secondary | ICD-10-CM | POA: Diagnosis not present

## 2024-03-10 DIAGNOSIS — E785 Hyperlipidemia, unspecified: Secondary | ICD-10-CM | POA: Diagnosis not present

## 2024-03-10 DIAGNOSIS — I480 Paroxysmal atrial fibrillation: Secondary | ICD-10-CM | POA: Diagnosis not present

## 2024-03-10 DIAGNOSIS — K219 Gastro-esophageal reflux disease without esophagitis: Secondary | ICD-10-CM | POA: Diagnosis not present

## 2024-03-15 DIAGNOSIS — Z9181 History of falling: Secondary | ICD-10-CM | POA: Diagnosis not present

## 2024-03-15 DIAGNOSIS — N1832 Chronic kidney disease, stage 3b: Secondary | ICD-10-CM | POA: Diagnosis not present

## 2024-03-15 DIAGNOSIS — E785 Hyperlipidemia, unspecified: Secondary | ICD-10-CM | POA: Diagnosis not present

## 2024-03-15 DIAGNOSIS — S72402D Unspecified fracture of lower end of left femur, subsequent encounter for closed fracture with routine healing: Secondary | ICD-10-CM | POA: Diagnosis not present

## 2024-03-15 DIAGNOSIS — M1612 Unilateral primary osteoarthritis, left hip: Secondary | ICD-10-CM | POA: Diagnosis not present

## 2024-03-15 DIAGNOSIS — Z556 Problems related to health literacy: Secondary | ICD-10-CM | POA: Diagnosis not present

## 2024-03-15 DIAGNOSIS — I4892 Unspecified atrial flutter: Secondary | ICD-10-CM | POA: Diagnosis not present

## 2024-03-15 DIAGNOSIS — E1122 Type 2 diabetes mellitus with diabetic chronic kidney disease: Secondary | ICD-10-CM | POA: Diagnosis not present

## 2024-03-15 DIAGNOSIS — S42212D Unspecified displaced fracture of surgical neck of left humerus, subsequent encounter for fracture with routine healing: Secondary | ICD-10-CM | POA: Diagnosis not present

## 2024-03-15 DIAGNOSIS — D631 Anemia in chronic kidney disease: Secondary | ICD-10-CM | POA: Diagnosis not present

## 2024-03-15 DIAGNOSIS — I129 Hypertensive chronic kidney disease with stage 1 through stage 4 chronic kidney disease, or unspecified chronic kidney disease: Secondary | ICD-10-CM | POA: Diagnosis not present

## 2024-03-15 DIAGNOSIS — I471 Supraventricular tachycardia, unspecified: Secondary | ICD-10-CM | POA: Diagnosis not present

## 2024-03-15 DIAGNOSIS — D519 Vitamin B12 deficiency anemia, unspecified: Secondary | ICD-10-CM | POA: Diagnosis not present

## 2024-03-15 DIAGNOSIS — Z7982 Long term (current) use of aspirin: Secondary | ICD-10-CM | POA: Diagnosis not present

## 2024-03-15 DIAGNOSIS — K219 Gastro-esophageal reflux disease without esophagitis: Secondary | ICD-10-CM | POA: Diagnosis not present

## 2024-03-15 DIAGNOSIS — Z993 Dependence on wheelchair: Secondary | ICD-10-CM | POA: Diagnosis not present

## 2024-03-15 DIAGNOSIS — I480 Paroxysmal atrial fibrillation: Secondary | ICD-10-CM | POA: Diagnosis not present

## 2024-03-23 DIAGNOSIS — I471 Supraventricular tachycardia, unspecified: Secondary | ICD-10-CM | POA: Diagnosis not present

## 2024-03-23 DIAGNOSIS — I129 Hypertensive chronic kidney disease with stage 1 through stage 4 chronic kidney disease, or unspecified chronic kidney disease: Secondary | ICD-10-CM | POA: Diagnosis not present

## 2024-03-23 DIAGNOSIS — S42212D Unspecified displaced fracture of surgical neck of left humerus, subsequent encounter for fracture with routine healing: Secondary | ICD-10-CM | POA: Diagnosis not present

## 2024-03-23 DIAGNOSIS — Z556 Problems related to health literacy: Secondary | ICD-10-CM | POA: Diagnosis not present

## 2024-03-23 DIAGNOSIS — E1122 Type 2 diabetes mellitus with diabetic chronic kidney disease: Secondary | ICD-10-CM | POA: Diagnosis not present

## 2024-03-23 DIAGNOSIS — S72402D Unspecified fracture of lower end of left femur, subsequent encounter for closed fracture with routine healing: Secondary | ICD-10-CM | POA: Diagnosis not present

## 2024-03-23 DIAGNOSIS — K219 Gastro-esophageal reflux disease without esophagitis: Secondary | ICD-10-CM | POA: Diagnosis not present

## 2024-03-23 DIAGNOSIS — Z9181 History of falling: Secondary | ICD-10-CM | POA: Diagnosis not present

## 2024-03-23 DIAGNOSIS — I480 Paroxysmal atrial fibrillation: Secondary | ICD-10-CM | POA: Diagnosis not present

## 2024-03-23 DIAGNOSIS — D631 Anemia in chronic kidney disease: Secondary | ICD-10-CM | POA: Diagnosis not present

## 2024-03-23 DIAGNOSIS — E785 Hyperlipidemia, unspecified: Secondary | ICD-10-CM | POA: Diagnosis not present

## 2024-03-23 DIAGNOSIS — M1612 Unilateral primary osteoarthritis, left hip: Secondary | ICD-10-CM | POA: Diagnosis not present

## 2024-03-23 DIAGNOSIS — D519 Vitamin B12 deficiency anemia, unspecified: Secondary | ICD-10-CM | POA: Diagnosis not present

## 2024-03-23 DIAGNOSIS — I4892 Unspecified atrial flutter: Secondary | ICD-10-CM | POA: Diagnosis not present

## 2024-03-23 DIAGNOSIS — Z7982 Long term (current) use of aspirin: Secondary | ICD-10-CM | POA: Diagnosis not present

## 2024-03-23 DIAGNOSIS — N1832 Chronic kidney disease, stage 3b: Secondary | ICD-10-CM | POA: Diagnosis not present

## 2024-03-23 DIAGNOSIS — Z993 Dependence on wheelchair: Secondary | ICD-10-CM | POA: Diagnosis not present

## 2024-03-25 DIAGNOSIS — S72492A Other fracture of lower end of left femur, initial encounter for closed fracture: Secondary | ICD-10-CM | POA: Diagnosis not present

## 2024-03-25 DIAGNOSIS — S42292A Other displaced fracture of upper end of left humerus, initial encounter for closed fracture: Secondary | ICD-10-CM | POA: Diagnosis not present

## 2024-03-29 DIAGNOSIS — D631 Anemia in chronic kidney disease: Secondary | ICD-10-CM | POA: Diagnosis not present

## 2024-03-29 DIAGNOSIS — Z7982 Long term (current) use of aspirin: Secondary | ICD-10-CM | POA: Diagnosis not present

## 2024-03-29 DIAGNOSIS — E785 Hyperlipidemia, unspecified: Secondary | ICD-10-CM | POA: Diagnosis not present

## 2024-03-29 DIAGNOSIS — K219 Gastro-esophageal reflux disease without esophagitis: Secondary | ICD-10-CM | POA: Diagnosis not present

## 2024-03-29 DIAGNOSIS — S72402D Unspecified fracture of lower end of left femur, subsequent encounter for closed fracture with routine healing: Secondary | ICD-10-CM | POA: Diagnosis not present

## 2024-03-29 DIAGNOSIS — I4892 Unspecified atrial flutter: Secondary | ICD-10-CM | POA: Diagnosis not present

## 2024-03-29 DIAGNOSIS — Z556 Problems related to health literacy: Secondary | ICD-10-CM | POA: Diagnosis not present

## 2024-03-29 DIAGNOSIS — Z9181 History of falling: Secondary | ICD-10-CM | POA: Diagnosis not present

## 2024-03-29 DIAGNOSIS — N1832 Chronic kidney disease, stage 3b: Secondary | ICD-10-CM | POA: Diagnosis not present

## 2024-03-29 DIAGNOSIS — I471 Supraventricular tachycardia, unspecified: Secondary | ICD-10-CM | POA: Diagnosis not present

## 2024-03-29 DIAGNOSIS — I129 Hypertensive chronic kidney disease with stage 1 through stage 4 chronic kidney disease, or unspecified chronic kidney disease: Secondary | ICD-10-CM | POA: Diagnosis not present

## 2024-03-29 DIAGNOSIS — Z993 Dependence on wheelchair: Secondary | ICD-10-CM | POA: Diagnosis not present

## 2024-03-29 DIAGNOSIS — M1612 Unilateral primary osteoarthritis, left hip: Secondary | ICD-10-CM | POA: Diagnosis not present

## 2024-03-29 DIAGNOSIS — I480 Paroxysmal atrial fibrillation: Secondary | ICD-10-CM | POA: Diagnosis not present

## 2024-03-29 DIAGNOSIS — E1122 Type 2 diabetes mellitus with diabetic chronic kidney disease: Secondary | ICD-10-CM | POA: Diagnosis not present

## 2024-03-29 DIAGNOSIS — D519 Vitamin B12 deficiency anemia, unspecified: Secondary | ICD-10-CM | POA: Diagnosis not present

## 2024-03-29 DIAGNOSIS — S42212D Unspecified displaced fracture of surgical neck of left humerus, subsequent encounter for fracture with routine healing: Secondary | ICD-10-CM | POA: Diagnosis not present

## 2024-04-07 DIAGNOSIS — Z7982 Long term (current) use of aspirin: Secondary | ICD-10-CM | POA: Diagnosis not present

## 2024-04-07 DIAGNOSIS — Z993 Dependence on wheelchair: Secondary | ICD-10-CM | POA: Diagnosis not present

## 2024-04-07 DIAGNOSIS — I129 Hypertensive chronic kidney disease with stage 1 through stage 4 chronic kidney disease, or unspecified chronic kidney disease: Secondary | ICD-10-CM | POA: Diagnosis not present

## 2024-04-07 DIAGNOSIS — D519 Vitamin B12 deficiency anemia, unspecified: Secondary | ICD-10-CM | POA: Diagnosis not present

## 2024-04-07 DIAGNOSIS — M1612 Unilateral primary osteoarthritis, left hip: Secondary | ICD-10-CM | POA: Diagnosis not present

## 2024-04-07 DIAGNOSIS — I4892 Unspecified atrial flutter: Secondary | ICD-10-CM | POA: Diagnosis not present

## 2024-04-07 DIAGNOSIS — E785 Hyperlipidemia, unspecified: Secondary | ICD-10-CM | POA: Diagnosis not present

## 2024-04-07 DIAGNOSIS — K219 Gastro-esophageal reflux disease without esophagitis: Secondary | ICD-10-CM | POA: Diagnosis not present

## 2024-04-07 DIAGNOSIS — Z9181 History of falling: Secondary | ICD-10-CM | POA: Diagnosis not present

## 2024-04-07 DIAGNOSIS — I471 Supraventricular tachycardia, unspecified: Secondary | ICD-10-CM | POA: Diagnosis not present

## 2024-04-07 DIAGNOSIS — S42212D Unspecified displaced fracture of surgical neck of left humerus, subsequent encounter for fracture with routine healing: Secondary | ICD-10-CM | POA: Diagnosis not present

## 2024-04-07 DIAGNOSIS — S72402D Unspecified fracture of lower end of left femur, subsequent encounter for closed fracture with routine healing: Secondary | ICD-10-CM | POA: Diagnosis not present

## 2024-04-07 DIAGNOSIS — D631 Anemia in chronic kidney disease: Secondary | ICD-10-CM | POA: Diagnosis not present

## 2024-04-07 DIAGNOSIS — N1832 Chronic kidney disease, stage 3b: Secondary | ICD-10-CM | POA: Diagnosis not present

## 2024-04-07 DIAGNOSIS — E1122 Type 2 diabetes mellitus with diabetic chronic kidney disease: Secondary | ICD-10-CM | POA: Diagnosis not present

## 2024-04-07 DIAGNOSIS — Z556 Problems related to health literacy: Secondary | ICD-10-CM | POA: Diagnosis not present

## 2024-04-07 DIAGNOSIS — I480 Paroxysmal atrial fibrillation: Secondary | ICD-10-CM | POA: Diagnosis not present

## 2024-04-28 ENCOUNTER — Inpatient Hospital Stay: Payer: 59

## 2024-04-30 ENCOUNTER — Inpatient Hospital Stay: Payer: 59 | Admitting: Oncology

## 2024-05-31 ENCOUNTER — Inpatient Hospital Stay: Attending: Physician Assistant

## 2024-05-31 DIAGNOSIS — D509 Iron deficiency anemia, unspecified: Secondary | ICD-10-CM | POA: Diagnosis not present

## 2024-05-31 DIAGNOSIS — D631 Anemia in chronic kidney disease: Secondary | ICD-10-CM | POA: Diagnosis not present

## 2024-05-31 DIAGNOSIS — E538 Deficiency of other specified B group vitamins: Secondary | ICD-10-CM

## 2024-05-31 DIAGNOSIS — N183 Chronic kidney disease, stage 3 unspecified: Secondary | ICD-10-CM | POA: Insufficient documentation

## 2024-05-31 DIAGNOSIS — N1832 Chronic kidney disease, stage 3b: Secondary | ICD-10-CM

## 2024-05-31 DIAGNOSIS — E559 Vitamin D deficiency, unspecified: Secondary | ICD-10-CM | POA: Insufficient documentation

## 2024-05-31 LAB — CBC WITH DIFFERENTIAL/PLATELET
Abs Immature Granulocytes: 0 K/uL (ref 0.00–0.07)
Basophils Absolute: 0 K/uL (ref 0.0–0.1)
Basophils Relative: 1 %
Eosinophils Absolute: 0.1 K/uL (ref 0.0–0.5)
Eosinophils Relative: 2 %
HCT: 36.6 % (ref 36.0–46.0)
Hemoglobin: 11.6 g/dL — ABNORMAL LOW (ref 12.0–15.0)
Immature Granulocytes: 0 %
Lymphocytes Relative: 56 %
Lymphs Abs: 3.4 K/uL (ref 0.7–4.0)
MCH: 30.1 pg (ref 26.0–34.0)
MCHC: 31.7 g/dL (ref 30.0–36.0)
MCV: 95.1 fL (ref 80.0–100.0)
Monocytes Absolute: 0.4 K/uL (ref 0.1–1.0)
Monocytes Relative: 7 %
Neutro Abs: 2 K/uL (ref 1.7–7.7)
Neutrophils Relative %: 34 %
Platelets: 208 K/uL (ref 150–400)
RBC: 3.85 MIL/uL — ABNORMAL LOW (ref 3.87–5.11)
RDW: 14.9 % (ref 11.5–15.5)
WBC: 6 K/uL (ref 4.0–10.5)
nRBC: 0 % (ref 0.0–0.2)

## 2024-05-31 LAB — VITAMIN D 25 HYDROXY (VIT D DEFICIENCY, FRACTURES): Vit D, 25-Hydroxy: 60.01 ng/mL (ref 30–100)

## 2024-05-31 LAB — COMPREHENSIVE METABOLIC PANEL WITH GFR
ALT: 11 U/L (ref 0–44)
AST: 15 U/L (ref 15–41)
Albumin: 3.3 g/dL — ABNORMAL LOW (ref 3.5–5.0)
Alkaline Phosphatase: 74 U/L (ref 38–126)
Anion gap: 8 (ref 5–15)
BUN: 24 mg/dL — ABNORMAL HIGH (ref 8–23)
CO2: 23 mmol/L (ref 22–32)
Calcium: 9.2 mg/dL (ref 8.9–10.3)
Chloride: 109 mmol/L (ref 98–111)
Creatinine, Ser: 1.27 mg/dL — ABNORMAL HIGH (ref 0.44–1.00)
GFR, Estimated: 40 mL/min — ABNORMAL LOW (ref >60)
Glucose, Bld: 98 mg/dL (ref 70–99)
Potassium: 3.9 mmol/L (ref 3.5–5.1)
Sodium: 140 mmol/L (ref 135–145)
Total Bilirubin: 0.7 mg/dL (ref 0.0–1.2)
Total Protein: 6.9 g/dL (ref 6.5–8.1)

## 2024-05-31 LAB — IRON AND TIBC
Iron: 54 ug/dL (ref 28–170)
Saturation Ratios: 24 % (ref 10.4–31.8)
TIBC: 229 ug/dL — ABNORMAL LOW (ref 250–450)
UIBC: 175 ug/dL

## 2024-05-31 LAB — FERRITIN: Ferritin: 88 ng/mL (ref 11–307)

## 2024-05-31 LAB — VITAMIN B12: Vitamin B-12: 1675 pg/mL — ABNORMAL HIGH (ref 180–914)

## 2024-06-03 ENCOUNTER — Inpatient Hospital Stay

## 2024-06-03 ENCOUNTER — Inpatient Hospital Stay: Attending: Physician Assistant | Admitting: Oncology

## 2024-06-03 VITALS — BP 118/62 | HR 80 | Temp 95.7°F | Resp 20

## 2024-06-03 DIAGNOSIS — E538 Deficiency of other specified B group vitamins: Secondary | ICD-10-CM | POA: Diagnosis not present

## 2024-06-03 DIAGNOSIS — D509 Iron deficiency anemia, unspecified: Secondary | ICD-10-CM | POA: Insufficient documentation

## 2024-06-03 DIAGNOSIS — D649 Anemia, unspecified: Secondary | ICD-10-CM | POA: Diagnosis not present

## 2024-06-03 DIAGNOSIS — N184 Chronic kidney disease, stage 4 (severe): Secondary | ICD-10-CM | POA: Diagnosis not present

## 2024-06-03 DIAGNOSIS — D631 Anemia in chronic kidney disease: Secondary | ICD-10-CM | POA: Insufficient documentation

## 2024-06-03 DIAGNOSIS — E559 Vitamin D deficiency, unspecified: Secondary | ICD-10-CM | POA: Diagnosis not present

## 2024-06-03 NOTE — Assessment & Plan Note (Addendum)
 B12 level still remain elevated at 1675. Recommend stopping B12 for now.  Will recheck labs in 3 to 4 months.

## 2024-06-03 NOTE — Assessment & Plan Note (Addendum)
 Creatinine has been stable at baseline.  Will continue to monitor kidney levels.

## 2024-06-03 NOTE — Patient Instructions (Signed)
 [facility] at [facility] **VISIT SUMMARY & IMPORTANT INSTRUCTIONS **    You were seen today by Delon Hope for Anemia d/t iron and B12. Vit D deficiency and CKD.    PLAN SUMMARY:  Decrease VIT B 12 to twice per week.  Hold Iron tabs for now.  Hold Vit D tabs for now.  RTC in 4 months with labs and see NP.          1. Normocytic anemia (Primary) Doing good.  Hold Iron tabs.    2. Vitamin B12 deficiency disease Reduce B12 tabs to twice per week.   3. Stage 4 chronic kidney disease (HCC) No need to Arensp shots at this time because hemoglobin is improving.   4. Vitamin D  deficiency Hold Vit D tabs.      LABS: Return in 4 months.     OTHER TESTS: N/A   MEDICATIONS: No New meds   FOLLOW-UP APPOINTMENT: 4 months.    ** Thank you for trusting me with your healthcare!  I strive to provide all of my patients with quality care at each visit.  If you receive a survey for this visit, I would be so grateful to you for taking the time to provide feedback.  Thank you in advance!                                          Dr. Mickiel Dry        Pleasant Barefoot, PA-C          Delon Hope, NP    - - - - - - - - - - - - - - - - - -      Thank you for choosing [facility] at [facility] to provide your oncology and hematology care.  To afford each patient quality time with our provider, please arrive at least 15 minutes before your scheduled appointment time.    If you have a lab appointment with the Cancer Center please come in thru the Main Entrance and check in at the main information desk.   You need to re-schedule your appointment should you arrive 10 or more minutes late.  We strive to give you quality time with our providers, and arriving late affects you and other patients whose appointments are after yours.  Also, if you no show three or more times for appointments you may be dismissed from the clinic at the providers  discretion.     Again, thank you for choosing [facility].  Our hope is that these requests will decrease the amount of time that you wait before being seen by our physicians.       _____________________________________________________________   Should you have questions after your visit to [facility], please contact our office at [phone] and follow the prompts.  Our office hours are 8:00 a.m. and 4:30 p.m. Monday - Friday.  Please note that voicemails left after 4:00 p.m. may not be returned until the following business day.  We are closed weekends and major holidays.  You do have access to a nurse 24-7, just call the main number to the clinic [phone] and do not press any options, hold on the line and a nurse will answer the phone.     For prescription refill requests, have your pharmacy contact our office  and allow 72 hours.

## 2024-06-03 NOTE — Assessment & Plan Note (Addendum)
 Most recent vitamin D  level is 60.01. She has not been taking vitamin D  supplements.  Continue to hold and will recheck labs in 4 months.

## 2024-06-03 NOTE — Progress Notes (Signed)
 [facility] OFFICE PROGRESS NOTE  Sheryle Carwin, MD  ASSESSMENT & PLAN:  Assessment & Plan Normocytic anemia Anemia felt to be secondary to CKD stage IIIb and relative iron deficiency  Patient had an EGD on 01/06/2015 due to hematemesis, which revealed severe erosive gastritis, moderate duodenitis, and multiple duodenal ulcers. She denies any recent hematemesis, bright red blood per rectum, or melena    SPEP negative for M spike. She previously received Aranesp  50 mcg every 12 weeks as needed for Hgb < 11.0.  (Aranesp  last given on 04/19/2020) Most recent labs show hemoglobin of 11.6 which is a significant improvement from lab work in July. No indication to reinitiate Aranesp  or iron infusion at this time. Currently not taking iron tabs.  We discussed that she can continue to hold iron tabs for now.  Recheck labs in 4 months. Vitamin B12 deficiency disease B12 level still remain elevated at 1675. Recommend stopping B12 for now.  Will recheck labs in 3 to 4 months.   Stage 4 chronic kidney disease (HCC) Creatinine has been stable at baseline.  Will continue to monitor kidney levels. Vitamin D  deficiency Most recent vitamin D  level is 60.01. She has not been taking vitamin D  supplements.  Continue to hold and will recheck labs in 4 months.    Orders Placed This Encounter  Procedures   Ferritin    Standing Status:   Future    Expected Date:   10/04/2024    Expiration Date:   01/02/2025   Iron and TIBC ([facility] ONLY)    Standing Status:   Future    Expected Date:   10/04/2024    Expiration Date:   01/02/2025   Vitamin B12    Standing Status:   Future    Expected Date:   10/04/2024    Expiration Date:   01/02/2025   Vitamin D  25 hydroxy    Standing Status:   Future    Expected Date:   10/04/2024    Expiration Date:   01/02/2025   CBC with Differential    Standing Status:   Future    Expected Date:   10/04/2024    Expiration Date:   01/02/2025   Comprehensive  metabolic panel    Standing Status:   Future    Expected Date:   10/04/2024    Expiration Date:   01/02/2025    INTERVAL HISTORY: Patient is a 89 year old female for anemia secondary to CKD and iron deficiency. She has severe underlying dementia and her daughter Arelene Moroni is her primary caregiver and healthcare power of attorney.  She reports overall her mother is doing well.    Since her last visit, patient was hospitalized on 02/28/2024 through 03/02/2024 for uncontrolled pain after a fall.  Imaging did not reveal hip fracture but did show closed subacute displaced proximal humerus fracture with dominant fracture plane through surgical neck.  Ortho recommended no surgical intervention and follow-up outpatient.  She was given a sling.   She continues to be mostly sedentary in her wheelchair.  Reports she is not quite back to where she was prior to the fall but she is getting back there.  States she uses the [employer] lift to get her in and out of bed but her mother is able to sit up and to occasionally transfer to her wheelchair.  She is still eating well tries most everything that is on her plate. She is got good energy.  She is sleeping well.  She denies any pain.  She has not noticed any blood in her stools or urine.   She is taking B12 supplements daily.  She is currently not taking vitamin D  and/or iron tablets.    We reviewed annual, ferritin, B12, vitamin D  and CBC with differential.  SUMMARY OF HEMATOLOGIC HISTORY: Oncology History   No history exists.     CBC    Component Value Date/Time   WBC 6.0 05/31/2024 0944   RBC 3.85 (L) 05/31/2024 0944   HGB 11.6 (L) 05/31/2024 0944   HCT 36.6 05/31/2024 0944   PLT 208 05/31/2024 0944   MCV 95.1 05/31/2024 0944   MCH 30.1 05/31/2024 0944   MCHC 31.7 05/31/2024 0944   RDW 14.9 05/31/2024 0944   LYMPHSABS 3.4 05/31/2024 0944   MONOABS 0.4 05/31/2024 0944   EOSABS 0.1 05/31/2024 0944   BASOSABS 0.0 05/31/2024 0944       Latest Ref  Rng & Units 05/31/2024    9:44 AM 03/01/2024    4:25 AM 02/28/2024    9:35 PM  CMP  Glucose 70 - 99 mg/dL 98  893  860   BUN 8 - 23 mg/dL 24  25  31    Creatinine 0.44 - 1.00 mg/dL 8.72  8.72  8.49   Sodium 135 - 145 mmol/L 140  138  134   Potassium 3.5 - 5.1 mmol/L 3.9  4.4  4.8   Chloride 98 - 111 mmol/L 109  107  103   CO2 22 - 32 mmol/L 23  21  19    Calcium  8.9 - 10.3 mg/dL 9.2  8.5  8.6   Total Protein 6.5 - 8.1 g/dL 6.9     Total Bilirubin 0.0 - 1.2 mg/dL 0.7     Alkaline Phos 38 - 126 U/L 74     AST 15 - 41 U/L 15     ALT 0 - 44 U/L 11        Lab Results  Component Value Date   FERRITIN 88 05/31/2024   VITAMINB12 1,675 (H) 05/31/2024    Vitals:   06/03/24 0910  BP: 118/62  Pulse: 80  Resp: 20  Temp: (!) 95.7 F (35.4 C)  SpO2: 92%    Review of System:  Review of Systems  Constitutional:  Positive for malaise/fatigue. Negative for chills and weight loss.  Musculoskeletal:  Positive for falls and joint pain.  Neurological:  Negative for dizziness and headaches.  Psychiatric/Behavioral:  Positive for memory loss. The patient has insomnia.     Physical Exam: Physical Exam Constitutional:      Appearance: Normal appearance.  HENT:     Head: Normocephalic and atraumatic.  Eyes:     Pupils: Pupils are equal, round, and reactive to light.  Cardiovascular:     Rate and Rhythm: Normal rate and regular rhythm.     Heart sounds: Normal heart sounds. No murmur heard. Pulmonary:     Effort: Pulmonary effort is normal.     Breath sounds: Normal breath sounds. No wheezing.  Abdominal:     General: Bowel sounds are normal. There is no distension.     Palpations: Abdomen is soft.     Tenderness: There is no abdominal tenderness.  Musculoskeletal:        General: Normal range of motion.     Cervical back: Normal range of motion.  Skin:    General: Skin is warm and dry.     Findings: No rash.  Neurological:  Mental Status: She is alert and oriented to person,  place, and time.     Gait: Gait is intact.  Psychiatric:        Mood and Affect: Mood and affect normal.        Cognition and Memory: Memory normal.        Judgment: Judgment normal.      I spent 20 minutes dedicated to the care of this patient (face-to-face and non-face-to-face) on the date of the encounter to include what is described in the assessment and plan.,  Delon Hope, NP 06/03/2024 9:41 AM

## 2024-06-03 NOTE — Assessment & Plan Note (Addendum)
 Anemia felt to be secondary to CKD stage IIIb and relative iron deficiency  Patient had an EGD on 01/06/2015 due to hematemesis, which revealed severe erosive gastritis, moderate duodenitis, and multiple duodenal ulcers. She denies any recent hematemesis, bright red blood per rectum, or melena    SPEP negative for M spike. She previously received Aranesp  50 mcg every 12 weeks as needed for Hgb < 11.0.  (Aranesp  last given on 04/19/2020) Most recent labs show hemoglobin of 11.6 which is a significant improvement from lab work in July. No indication to reinitiate Aranesp  or iron infusion at this time. Currently not taking iron tabs.  We discussed that she can continue to hold iron tabs for now.  Recheck labs in 4 months.

## 2024-10-04 ENCOUNTER — Inpatient Hospital Stay

## 2024-10-05 ENCOUNTER — Inpatient Hospital Stay

## 2024-10-07 ENCOUNTER — Inpatient Hospital Stay: Admitting: Oncology

## 2024-10-15 ENCOUNTER — Inpatient Hospital Stay: Attending: Physician Assistant

## 2024-10-19 ENCOUNTER — Inpatient Hospital Stay: Admitting: Oncology
